# Patient Record
Sex: Male | Born: 1955
Health system: Southern US, Community
[De-identification: ages and names within clinical notes are randomized; demographics above are authoritative.]

## PROBLEM LIST (undated history)

## (undated) DIAGNOSIS — R269 Unspecified abnormalities of gait and mobility: Secondary | ICD-10-CM

## (undated) DIAGNOSIS — R413 Other amnesia: Secondary | ICD-10-CM

## (undated) DIAGNOSIS — F32A Depression, unspecified: Secondary | ICD-10-CM

## (undated) DIAGNOSIS — C8590 Non-Hodgkin lymphoma, unspecified, unspecified site: Secondary | ICD-10-CM

## (undated) DIAGNOSIS — F419 Anxiety disorder, unspecified: Secondary | ICD-10-CM

## (undated) DIAGNOSIS — F329 Major depressive disorder, single episode, unspecified: Secondary | ICD-10-CM

## (undated) DIAGNOSIS — M722 Plantar fascial fibromatosis: Secondary | ICD-10-CM

## (undated) HISTORY — DX: Other amnesia: R41.3

## (undated) HISTORY — DX: Unspecified abnormalities of gait and mobility: R26.9

## (undated) HISTORY — PX: ABDOMINAL SURGERY: SHX537

## (undated) HISTORY — DX: Depression, unspecified: F32.A

## (undated) HISTORY — DX: Plantar fascial fibromatosis: M72.2

## (undated) HISTORY — DX: Major depressive disorder, single episode, unspecified: F32.9

## (undated) HISTORY — DX: Anxiety disorder, unspecified: F41.9

## (undated) HISTORY — PX: BUNIONECTOMY: SHX129

---

## 2000-03-17 ENCOUNTER — Encounter: Payer: Self-pay | Admitting: Rheumatology

## 2000-03-17 ENCOUNTER — Ambulatory Visit (HOSPITAL_COMMUNITY): Admission: RE | Admit: 2000-03-17 | Discharge: 2000-03-17 | Payer: Self-pay | Admitting: *Deleted

## 2002-01-15 ENCOUNTER — Ambulatory Visit (HOSPITAL_COMMUNITY): Admission: RE | Admit: 2002-01-15 | Discharge: 2002-01-15 | Payer: Self-pay | Admitting: *Deleted

## 2005-03-26 ENCOUNTER — Ambulatory Visit: Payer: Self-pay | Admitting: Gastroenterology

## 2005-03-27 ENCOUNTER — Ambulatory Visit: Payer: Self-pay | Admitting: Gastroenterology

## 2005-03-27 ENCOUNTER — Encounter (INDEPENDENT_AMBULATORY_CARE_PROVIDER_SITE_OTHER): Payer: Self-pay | Admitting: *Deleted

## 2005-04-25 ENCOUNTER — Ambulatory Visit: Payer: Self-pay | Admitting: Gastroenterology

## 2005-05-03 ENCOUNTER — Ambulatory Visit (HOSPITAL_COMMUNITY): Admission: RE | Admit: 2005-05-03 | Discharge: 2005-05-03 | Payer: Self-pay | Admitting: Gastroenterology

## 2005-05-23 ENCOUNTER — Ambulatory Visit: Payer: Self-pay | Admitting: Gastroenterology

## 2006-07-10 ENCOUNTER — Ambulatory Visit: Payer: Self-pay | Admitting: Pulmonary Disease

## 2006-07-16 ENCOUNTER — Ambulatory Visit: Payer: Self-pay | Admitting: Licensed Clinical Social Worker

## 2007-03-11 ENCOUNTER — Ambulatory Visit: Payer: Self-pay | Admitting: Pulmonary Disease

## 2007-03-17 ENCOUNTER — Ambulatory Visit: Payer: Self-pay | Admitting: Licensed Clinical Social Worker

## 2007-03-25 ENCOUNTER — Ambulatory Visit: Payer: Self-pay | Admitting: Licensed Clinical Social Worker

## 2007-04-03 ENCOUNTER — Ambulatory Visit: Payer: Self-pay | Admitting: Licensed Clinical Social Worker

## 2007-04-10 ENCOUNTER — Ambulatory Visit: Payer: Self-pay | Admitting: Licensed Clinical Social Worker

## 2007-04-15 ENCOUNTER — Ambulatory Visit: Payer: Self-pay | Admitting: Licensed Clinical Social Worker

## 2007-04-28 ENCOUNTER — Ambulatory Visit: Payer: Self-pay | Admitting: Family Medicine

## 2007-04-28 ENCOUNTER — Ambulatory Visit: Payer: Self-pay | Admitting: Licensed Clinical Social Worker

## 2007-04-28 DIAGNOSIS — G4700 Insomnia, unspecified: Secondary | ICD-10-CM | POA: Insufficient documentation

## 2007-04-28 DIAGNOSIS — F909 Attention-deficit hyperactivity disorder, unspecified type: Secondary | ICD-10-CM | POA: Insufficient documentation

## 2007-04-28 DIAGNOSIS — F419 Anxiety disorder, unspecified: Secondary | ICD-10-CM | POA: Insufficient documentation

## 2007-04-28 DIAGNOSIS — F411 Generalized anxiety disorder: Secondary | ICD-10-CM | POA: Insufficient documentation

## 2007-04-28 DIAGNOSIS — K219 Gastro-esophageal reflux disease without esophagitis: Secondary | ICD-10-CM | POA: Insufficient documentation

## 2007-05-08 ENCOUNTER — Ambulatory Visit: Payer: Self-pay | Admitting: Licensed Clinical Social Worker

## 2007-05-13 ENCOUNTER — Ambulatory Visit: Payer: Self-pay | Admitting: Licensed Clinical Social Worker

## 2007-05-20 ENCOUNTER — Ambulatory Visit: Payer: Self-pay | Admitting: Licensed Clinical Social Worker

## 2007-05-22 ENCOUNTER — Ambulatory Visit: Payer: Self-pay | Admitting: Family Medicine

## 2007-05-29 ENCOUNTER — Ambulatory Visit: Payer: Self-pay | Admitting: Licensed Clinical Social Worker

## 2007-06-03 ENCOUNTER — Ambulatory Visit: Payer: Self-pay | Admitting: Licensed Clinical Social Worker

## 2007-06-10 ENCOUNTER — Ambulatory Visit: Payer: Self-pay | Admitting: Licensed Clinical Social Worker

## 2007-06-10 ENCOUNTER — Encounter: Payer: Self-pay | Admitting: Family Medicine

## 2007-08-31 ENCOUNTER — Other Ambulatory Visit (HOSPITAL_COMMUNITY): Admission: RE | Admit: 2007-08-31 | Discharge: 2007-11-29 | Payer: Self-pay | Admitting: Psychiatry

## 2007-09-02 ENCOUNTER — Ambulatory Visit: Payer: Self-pay | Admitting: Psychiatry

## 2007-11-03 ENCOUNTER — Ambulatory Visit: Payer: Self-pay | Admitting: Family Medicine

## 2007-11-03 LAB — CONVERTED CEMR LAB
Bilirubin Urine: NEGATIVE
Blood in Urine, dipstick: NEGATIVE
Glucose, Urine, Semiquant: NEGATIVE
Ketones, urine, test strip: NEGATIVE
Nitrite: NEGATIVE
Protein, U semiquant: NEGATIVE
Specific Gravity, Urine: 1.02
Urobilinogen, UA: 0.2
WBC Urine, dipstick: NEGATIVE
pH: 7.5

## 2007-11-04 LAB — CONVERTED CEMR LAB
ALT: 32 units/L (ref 0–53)
AST: 25 units/L (ref 0–37)
Albumin: 4.3 g/dL (ref 3.5–5.2)
Alkaline Phosphatase: 59 units/L (ref 39–117)
BUN: 10 mg/dL (ref 6–23)
Basophils Absolute: 0 10*3/uL (ref 0.0–0.1)
Basophils Relative: 0.5 % (ref 0.0–1.0)
Bilirubin, Direct: 0.1 mg/dL (ref 0.0–0.3)
CO2: 30 meq/L (ref 19–32)
Calcium: 9.8 mg/dL (ref 8.4–10.5)
Chloride: 108 meq/L (ref 96–112)
Cholesterol: 197 mg/dL (ref 0–200)
Creatinine, Ser: 0.9 mg/dL (ref 0.4–1.5)
Eosinophils Absolute: 0.2 10*3/uL (ref 0.0–0.6)
Eosinophils Relative: 3.4 % (ref 0.0–5.0)
GFR calc Af Amer: 115 mL/min
GFR calc non Af Amer: 95 mL/min
Glucose, Bld: 101 mg/dL — ABNORMAL HIGH (ref 70–99)
HCT: 45 % (ref 39.0–52.0)
HDL: 37.8 mg/dL — ABNORMAL LOW (ref >39.0)
Hemoglobin: 15.3 g/dL (ref 13.0–17.0)
LDL Cholesterol: 135 mg/dL — ABNORMAL HIGH (ref 0–99)
Lymphocytes Relative: 31.7 % (ref 12.0–46.0)
MCHC: 34.1 g/dL (ref 30.0–36.0)
MCV: 89.9 fL (ref 78.0–100.0)
Monocytes Absolute: 0.4 10*3/uL (ref 0.2–0.7)
Monocytes Relative: 7 % (ref 3.0–11.0)
Neutro Abs: 3.3 10*3/uL (ref 1.4–7.7)
Neutrophils Relative %: 57.4 % (ref 43.0–77.0)
PSA: 0.61 ng/mL (ref 0.10–4.00)
Platelets: 222 10*3/uL (ref 150–400)
Potassium: 4.8 meq/L (ref 3.5–5.1)
RBC: 5 M/uL (ref 4.22–5.81)
RDW: 13.6 % (ref 11.5–14.6)
Sodium: 144 meq/L (ref 135–145)
TSH: 1.07 microintl units/mL (ref 0.35–5.50)
Total Bilirubin: 1.1 mg/dL (ref 0.3–1.2)
Total CHOL/HDL Ratio: 5.2
Total Protein: 6.4 g/dL (ref 6.0–8.3)
Triglycerides: 119 mg/dL (ref 0–149)
VLDL: 24 mg/dL (ref 0–40)
WBC: 5.7 10*3/uL (ref 4.5–10.5)

## 2007-11-17 ENCOUNTER — Ambulatory Visit: Payer: Self-pay | Admitting: Family Medicine

## 2007-11-26 ENCOUNTER — Telehealth: Payer: Self-pay | Admitting: Family Medicine

## 2007-11-27 ENCOUNTER — Ambulatory Visit: Payer: Self-pay | Admitting: *Deleted

## 2007-11-27 ENCOUNTER — Inpatient Hospital Stay (HOSPITAL_COMMUNITY): Admission: AD | Admit: 2007-11-27 | Discharge: 2007-12-03 | Payer: Self-pay | Admitting: *Deleted

## 2008-02-25 ENCOUNTER — Encounter: Payer: Self-pay | Admitting: Family Medicine

## 2008-04-19 ENCOUNTER — Ambulatory Visit: Payer: Self-pay | Admitting: Family Medicine

## 2008-04-19 DIAGNOSIS — E291 Testicular hypofunction: Secondary | ICD-10-CM | POA: Insufficient documentation

## 2008-06-07 ENCOUNTER — Encounter: Payer: Self-pay | Admitting: Family Medicine

## 2010-10-18 NOTE — Letter (Signed)
Summary: Dr. Gerilyn Pilgrim note  Dr. Gerilyn Pilgrim note   Imported By: Kassie Mends 07/06/2007 10:20:37  _____________________________________________________________________  External Attachment:    Type:   Image     Comment:   Dr. Gerilyn Pilgrim note
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
## 2010-10-18 NOTE — Assessment & Plan Note (Signed)
Summary: 3 wk rov/njr   Vital Signs:  Patient Profile:   55 Years Old Male Height:     73.5 inches (186.69 cm) Weight:      179 pounds (81.36 kg) Temp:     98.8 degrees F (37.11 degrees C) oral Pulse rate:   100 / minute Pulse rhythm:   regular BP sitting:   128 / 70  (left arm)  Vitals Entered By: Alfred Levins, CMA (May 22, 2007 8:43 AM)                 Chief Complaint:  f/u on med.  History of Present Illness: Seen recently for anxiety and insomnia, with possible ADHD. Placed on Adderall and Ambien. Seeing Judithe Modest for therapy also. Is sleeping better but does not feel any better during the day. Had seen Dr. Jennelle Human and Dr. Jodi Marble in past, among others.  Current Allergies: No known allergies       Physical Exam  General:     Well-developed,well-nourished,in no acute distress; alert,appropriate and cooperative throughout examination Psych:     moderately anxious.      Impression & Recommendations:  Problem # 1:  INSOMNIA, PERSISTENT (ICD-307.42) Assessment: Improved  Problem # 2:  ANXIETY (ICD-300.00) stop Adderall Orders: Psychiatric Referral (Psych)   Complete Medication List: 1)  Prilosec Otc 20 Mg Tbec (Omeprazole magnesium) .Marland Kitchen.. 1 by mouth once daily 2)  Aspirin 325 Mg Tabs (Aspirin) .Marland Kitchen.. 1 by mouth once daily 3)  Multivitamins Tabs (Multiple vitamin) .Marland Kitchen.. 1 by mouth once daily 4)  Ambien Cr 12.5 Mg Tbcr (Zolpidem tartrate) .... At bedtime   Patient Instructions: 1)  wiil refer to Psychiatry

## 2010-10-18 NOTE — Consult Note (Signed)
Summary: [facility]   Imported By: Maryln Gottron 06/30/2008 13:52:06  _____________________________________________________________________  External Attachment:    Type:   Image     Comment:   External Document
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
## 2010-10-18 NOTE — Assessment & Plan Note (Signed)
Summary: FATIGUE/NJR   Vital Signs:  Patient Profile:   55 Years Old Male Height:     73.5 inches (186.69 cm) Weight:      190 pounds Temp:     98.9 degrees F oral Pulse rate:   119 / minute BP sitting:   114 / 72  (left arm) Cuff size:   large  Vitals Entered By: Alfred Levins, CMA (April 19, 2008 11:31 AM)                 Chief Complaint:  check testosterone level.  History of Present Illness: He is here asking for a referral to an Endocrine doctor to evaluate his "hormones" and specifically for advice on testosterone supplementation. In March he spent one week at the [facility] for anxiety. At that time he was found to have a low testosterone level. He was put on Androgel, and he used this for several months. Then he had complete labs done through [facility] on 02-25-08 including total serum testosterone and free testosterone levels, all of which were normal. For some reason he then stopped the Andorgel 2 months ago, and now he wants to know whether to restart it. He says it never helped him feel better, specifically it did not increase his energy levels. he continues to see Valinda Hoar, and now he is on quite a few psychoactive meds.     Current Allergies: No known allergies   Past Medical History:    Reviewed history from 11/17/2007 and no changes required:       Anxiety, sees Valinda Hoar NP and Dr. Nolen Mu       GERD (has had an esophageal dilatation)       insomnia       ADHD     Review of Systems  The patient denies anorexia, fever, weight loss, weight gain, vision loss, decreased hearing, hoarseness, chest pain, syncope, dyspnea on exertion, peripheral edema, prolonged cough, headaches, hemoptysis, abdominal pain, melena, hematochezia, severe indigestion/heartburn, hematuria, incontinence, genital sores, muscle weakness, suspicious skin lesions, transient blindness, difficulty walking, depression, unusual weight change, abnormal bleeding, enlarged  lymph nodes, angioedema, breast masses, and testicular masses.     Physical Exam  General:     Well-developed,well-nourished,in no acute distress; alert,appropriate and cooperative throughout examination Psych:     Oriented X3, memory intact for recent and remote, normally interactive, good eye contact, and moderately anxious.      Impression & Recommendations:  Problem # 1:  INSOMNIA, PERSISTENT (ICD-307.42)  Problem # 2:  ADHD (ICD-314.01)  Problem # 3:  ANXIETY (ICD-300.00)  The following medications were removed from the medication list:    Zoloft 50 Mg Tabs (Sertraline hcl) ..... Once daily  His updated medication list for this problem includes:    Cymbalta 60 Mg Cpep (Duloxetine hcl) ..... Once daily    Klonopin 0.5 Mg Tabs (Clonazepam) .Marland Kitchen... 1/2 in am, 1/2 at noon, and one at bedtime   Problem # 4:  TESTOSTERONE DEFICIENCY (ICD-257.2)  Orders: Endocrinology Referral (Endocrine)   Complete Medication List: 1)  Prilosec Otc 20 Mg Tbec (Omeprazole magnesium) .Marland Kitchen.. 1 by mouth once daily 2)  Aspirin 325 Mg Tabs (Aspirin) .Marland Kitchen.. 1 by mouth once daily 3)  Multivitamins Tabs (Multiple vitamin) .Marland Kitchen.. 1 by mouth once daily 4)  Ambien Cr 12.5 Mg Tbcr (Zolpidem tartrate) .... One at bedtime 5)  Cymbalta 60 Mg Cpep (Duloxetine hcl) .... Once daily 6)  Abilify 2 Mg Tabs (Aripiprazole) .... Once daily 7)  Vyvanse 70 Mg Caps (Lisdexamfetamine dimesylate) .... Once daily 8)  Klonopin 0.5 Mg Tabs (Clonazepam) .... 1/2 in am, 1/2 at noon, and one at bedtime   Patient Instructions: 1)  Will refer to Endocrine to get their opinion   ]

## 2010-10-18 NOTE — Progress Notes (Signed)
Summary: zoloft rx  Phone Note Call from Patient Call back at Home Phone [phone]   Caller: patient vm triage Call For: George Proctor  Summary of Call: saw dr Ellyn Rubiano last week and was given an rx for adderall.  He does not feel he is tolerating the med.  He is wound up and cant calm down.   he either needs another med or another appt or something for [address] [facility] [address].   Initial call taken by: Roselle Locus,  November 26, 2007 2:28 PM  Follow-up for Phone Call        stop Adderall, and start Zoloft 50mg  once daily . Call in #30 and see me in 3 weeks. Follow-up by: Nelwyn Salisbury MD,  November 27, 2007 9:03 AM  Additional Follow-up for Phone Call Additional follow up Details #1::        rx called in. left pt mess to sch appt in 3 weeks Additional Follow-up by: Sindy Guadeloupe RN,  November 27, 2007 5:07 PM    New/Updated Medications: ZOLOFT 50 MG  TABS (SERTRALINE HCL) once daily   Prescriptions: ZOLOFT 50 MG  TABS (SERTRALINE HCL) once daily  #30 x 0   Entered by:   Sindy Guadeloupe RN   Authorized by:   Nelwyn Salisbury MD   Signed by:   Sindy Guadeloupe RN on 11/27/2007   Method used:   Telephoned to ...         RxID:   [id number]

## 2011-01-29 NOTE — Assessment & Plan Note (Signed)
[facility]                             PULMONARY OFFICE NOTE   NAME:George Proctor, George Proctor                       MRN:          [id number]  DATE:03/11/2007                            DOB:          10/26/1955    I met George Proctor today in follow up for his fatigue and daytime  sleepiness.   He says that since his last visit with me in October 2007, he had been  evaluated by Judithe Modest once, but then he had switched his therapist to  the therapist that his wife was going to. He says that he seemed to  respond better to Judithe Modest, and is planning on being re-evaluated by  her. Otherwise, his difficulties with sleeping at night and feeling and  tired during the day are essentially the same. Again, I had seen copies  of his overnight polysomnogram and MSLT from January 2004 at [facility], and the findings which were remarkable from these tests where  that he had an average sleep latency of 3.5 minutes which would be  consistent with excessive daytime sleepiness, but there is really no  other pathology noted. What I have recommended for George Proctor is to follow  up with Judithe Modest to optimize his therapy for his depression. If, after  that, he is still having difficulties with fatigue and excessive daytime  sleepiness, then we can certainly consider restarting a stimulant  medication, but I would not want to do that at this time.   I will follow up with him in approximately six months.     Coralyn Helling, MD  Electronically Signed    VS/MedQ  DD: 03/11/2007  DT: 03/12/2007  Job #: [id number]   cc:   Judithe Modest, MSW, LCSW

## 2011-02-01 NOTE — Procedures (Signed)
[facility]  Patient:    MARYLAND, LUPPINO Visit Number: [id number] MRN: [id number]          Service Type: END Location: ENDO Attending Physician:  Sabino Gasser Dictated by:   Sabino Gasser, M.D. Proc. Date: 01/15/02 Admit Date:  01/15/2002                             Procedure Report  PROCEDURE:  Upper endoscopy.  INDICATION:  Gastroesophageal reflux disease.  ANESTHESIA:  Demerol 50, Versed 6 mg.  DESCRIPTION OF PROCEDURE:  With the patient mildly sedated in the left lateral decubitus position, the Olympus videoscopic endoscope was inserted into the mouth and passed under direct vision through the esophagus which appeared normal into the stomach; fundus, body, antrum, duodenal bulb, and second portion of duodenum were visualized.  From this point, the endoscope was slowly withdrawn, taking circumferential views of the entire duodenal mucosa until the endoscope then pulled back into the stomach and placed in retroflexion to view the stomach from below.  The endoscope was then straightened and withdrawn, taking circumferential views of the remaining gastric and esophageal mucosa.  The patients vital signs and pulse oximeter remained stable.  The patient tolerated the procedure well without apparent complications.  FINDINGS:  Unremarkable examination.  PLAN:  Have patient will call me for results and follow up with me as an outpatient. Dictated by:   Sabino Gasser, M.D. Attending Physician:  Sabino Gasser DD:  01/15/02 TD:  01/16/02 Job: [id number]

## 2011-02-01 NOTE — Assessment & Plan Note (Signed)
[facility]                             PULMONARY OFFICE NOTE   NAME:Liuzzi, ARK AGRUSA                       MRN:          [id number]  DATE:09/01/2006                            DOB:          08-07-1956    I received a copy of Mr. Housand overnight polysomnogram and MSLT from  [facility].   The overnight polysomnogram was done on October 05, 2002 and this was an  essentially polysomnogram, although he did appear to have a reduced  sleep latency to 3.5 minutes.   He then had a multiple sleep latency test on October 06, 2002 and this  was a test with 4 nap sessions.  He achieved sleep in all 4 studies with  a mean sleep latency of 3.5 minutes, which is significantly reduced;  however, he did not have any sleep onset REMs periods.  These results  would be consistant with excessive sleepiness, but certainly not  suggestive of narcolepsy.     Coralyn Helling, MD  Electronically Signed    VS/MedQ  DD: 09/01/2006  DT: 09/02/2006  Job #: [id number]

## 2011-02-01 NOTE — Assessment & Plan Note (Signed)
[facility]                          BEHAVIORAL MEDICINE OFFICE NOTE   George Proctor, George Proctor                       MRN:          [id number]  DATE:07/23/2006                            DOB:          Feb 15, 1956    I spoke with the patient on July 22, 2006, on the phone.  He had called  me the day before to follow up on a referral I had given him.  During our  first session which was on July 16, 2006, the patient had described  suffering from feelings of depression and described difficulties which  sounded like they might be neurological in origin.  He mentioned that he has  sudden difficulty with processing information, and coordinating himself  physically.  An example of this is that sometimes he will think about how he  has to get up and get a book from his bookcase and suddenly cannot  coordinate himself enough to do this.  These kinds of things happen  sporadically every couple of weeks.  He describes having a very poor memory,  and he cannot process information very well or very fast.  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  I suggested to [name] that it might be a good idea for him  to follow up with his primary care physician about getting a referral to  neurologist to be checked up.  He had shared these symptoms with his long-  time primary care physician, Dr. Mia Proctor, over a period of five  years.  She had apparently prescribed nutritional and herbal remedies for  this repeatedly but things had not improved.  I suggested that it sounded to  me like he was unhappy with his primary care physician and suggested that he  might like to see someone at [facility].  He agreed that this might be helpful  so I gave him a referral to Dr. Artist Proctor.  He called me a day or so later to tell  me he was unable to get an appointment with Dr. Artist Proctor until December and did  not wish to wait  this long.  I suggested that he see whether he could get on  a cancellation list in order to possibly get an earlier appointment and he  called back again the next day to inform me that this was not a possibility.  The patient decided that since he did not want to wait a long time that he  would go back to his primary care doctor, Dr. Ananias Proctor, just to get a  referral to a neurologist.  The patient called to inform me that he had done  this and Dr. Ananias Proctor had referred him to Dr. Sandria Proctor.  No appointment has been  made as yet because Dr. Sandria Proctor will require a full review of Mr. Santerre  medical records before he decides whether to treat him.  I asked Mr. Hyser whether he felt he needed to come in before he sees Dr.  Sandria Proctor, or anytime soon.  He said no, not at  the moment but that he would call  if he needed to come in.  He also said that he would keep me informed as to  how things went with Dr. Sandria Proctor.  We will await the patient's call for further  contact.    ______________________________  George Proctor, MSW, LCSW    SB/MedQ  DD: 07/23/2006  DT: 07/23/2006  Job #: [id number]

## 2011-02-01 NOTE — Discharge Summary (Signed)
NAMEMARVEL, [name]                ACCOUNT NO.:  [id number]   MEDICAL RECORD NO.:  [id number]          PATIENT TYPE:  IPS   LOCATION:  0306                          FACILITY:  [facility]   PHYSICIAN:  Geoffery Lyons, M.D.      DATE OF BIRTH:  03-Apr-1956   DATE OF ADMISSION:  11/27/2007  DATE OF DISCHARGE:  12/03/2007                               DISCHARGE SUMMARY   CHIEF COMPLAINT AND PRESENTING ILLNESS:  This was the first admission to  [facility] for this 55 year old white male voluntarily  admitted working in a Engineer, building services.  Presented with  suicidal thoughts of killing himself with carbon monoxide.  Has been  increasingly more depressed since the wife left in October 2008.  She  left because he was not functioning, getting worse for the last 3 weeks.  More pressure at work with major project deadline.  Endorsed chronic  depression with issues of relationships, multiple medication trials,  anhedonic, anxious, with panic, but also tired, angry.   PAST PSYCHIATRIC HISTORY:  Seen by Valinda Hoar and Merlyn Albert May.  First  time at [facility].  Has a history of prior overdose when he was  in college.  Has been in IOP in the past.   ALCOHOL AND DRUG HISTORY:  Denies active use of any substances.   MEDICAL HISTORY:  Noncontributory.  Recently Desyrel which made him more  anxious when dose was doubled.  Ambien CR 12.5 mg per day.  Physical  exam failed to show any acute findings.   LABORATORY WORKUP:  Results not available in the chart.   PHYSICAL EXAMINATION:  Reveals a fully-alert cooperative male.  Mood  anxious, depressed.  Affect anxious, depressed.  Thought processes  logical, coherent and relevant.  Endorsed suicidal ruminations.  No  intent at this particular time.  No active homicidal ideas, no  delusions.  No hallucinations.  Cognition well-preserved.   AXIS I:  Major depression, recurrent.  AXIS II: No diagnosis.  AXIS III:  No diagnosis.  AXIS IV: Moderate.  AXIS V:  Upon admission 35, highest global assessment of functioning in  the last year 70.   COURSE IN THE HOSPITAL:  He was admitted.  He was started in individual  and group psychotherapy.  Endorsed that he had been overwhelmed by  everything.  Endorsed he had a nervous breakdown, cannot stop crying.  Cannot sleep, cannot rest, anhedonia, nothing to look forward to.  Wife  moved out October 2008.  She could not deal with him.  __________  he  tends to procrastinate.  Head is foggy.  Long history of depression for  years.  Suicidal ideations in 1981, attempt when he was 23.  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  Feeling alone and empty.  Had a hard time functioning with  his wife of 2 years.  He was also having issues dealing with his stepson  who was 13.  Had hard time making connections.  Married  in 1982 for 3  years, lived with someone for 61.  For past psychiatric history, had  seen Fred May two or three times.  Irritability.  Has been on Prozac,  Zoloft, Celexa, Paxil, Wellbutrin, Cymbalta and Abilify, Adderall on and  off.  Also returning on Klonopin.  March 15 he had remembered that he  might have done better on the Cymbalta and the Abilify combination.  We  tried Klonopin to help with the anxiety that he felt it was  incapacitating.  Felt the Klonopin was too sedating.  Very worried about  the project that he had to deal with.  Ruminating about it, worrying  about it, not sure if it could be delayed until he is able to go back.  There was anxiety, worrying, depressed mood.  We worked on Cymbalta 30  and Abilify 2, worked with Klonopin 0.5 twice a day and Klonopin 1 mg at  bedtime.  We checked the thyroid.  Thyroid level was very low.  We  consulted with his primary physician and he was in accord to replace  testosterone.  He has had previous  levels that were borderline low, not  as low as the one obtained upon this admission.  We continued the  Cymbalta and the Abilify.  We adjusted the Klonopin.  He was somewhat  sedated in the morning.  We continued to decrease the Klonopin as he  required less of a dose as the anxiety seemed to be under better  control.  Did say that Desyrel helped him with the smaller dosages, was  wanting to give this another try.  He endorsed that cognitive dulling  had been going on for years since he was a child, possibly inattentive.  We tried Adderall 10 mg in the morning and at 1 p.m.  Family session  with his father and wife.  They are separated.  There were married  shortly after wife came to the [place].  The patient endorsed that he was  feeling better.  The family validated that he was not crying.  He was  also relieved that a project at work has been given to someone else.  This was a major stressor.  Did admit to letting the stress build and  build and not expressing himself to anyone.  Fears rejection or that he  will make others depressed if he shares.  He was more hopeful about  medications and was considering ECT in the future if this combination of  medication was not going to be effective.  Wife endorsed that he had had  ups and downs, mostly no energy, no motivation.  March 19 he was in full  contact with reality.  There were no active suicidal or homicidal ideas,  no hallucination or delusions.  Felt that Desyrel was helpful.  He was  more hopeful, was willing to go back to individual therapy and pursue  further medication management.  Testosterone is being replaced and that  might also make a difference.   DISCHARGE DIAGNOSES:  AXIS I:  Major depression recurrent; attention  deficit hyperactivity disorder, inattentive.  AXIS II:  Dependent personality traits.  AXIS III:  Low testosterone level.  AXIS IV:  Moderate.  AXIS V:  Upon discharge 50-55.   Discharged on:  1. AndroGel 1% gel  5 grams daily, apply as directed.  2. Ambien CR 12.5 at bedtime as needed for sleep.  3. Cymbalta 30 mg in the morning.  4. Abilify 2 mg in the  morning.  5. Klonopin 0.5 one-half twice a day and one at night.  6. Adderall 10 mg one in the morning and one in the afternoon with      plans to increase to one-and-a-half in the morning and one-and-a-      half in the afternoon.   FOLLOW-UP:  Fred May and Valinda Hoar.      Geoffery Lyons, M.D.  Electronically Signed     IL/MEDQ  D:  01/01/2008  T:  01/01/2008  Job:  [id number]
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
## 2011-03-07 ENCOUNTER — Other Ambulatory Visit: Payer: Self-pay | Admitting: Gastroenterology

## 2011-03-07 DIAGNOSIS — R5383 Other fatigue: Secondary | ICD-10-CM

## 2011-03-07 DIAGNOSIS — R197 Diarrhea, unspecified: Secondary | ICD-10-CM

## 2011-03-08 ENCOUNTER — Ambulatory Visit
Admission: RE | Admit: 2011-03-08 | Discharge: 2011-03-08 | Disposition: A | Discharge status: Home / Self Care | Payer: 59 | Source: Ambulatory Visit | Attending: Gastroenterology | Admitting: Gastroenterology

## 2011-03-08 DIAGNOSIS — R197 Diarrhea, unspecified: Secondary | ICD-10-CM

## 2011-03-08 DIAGNOSIS — R5383 Other fatigue: Secondary | ICD-10-CM

## 2011-06-10 LAB — COMPREHENSIVE METABOLIC PANEL
ALT: 23
AST: 21
Albumin: 4.3
Alkaline Phosphatase: 57
BUN: 12
CO2: 29
Calcium: 9.9
Chloride: 105
Creatinine, Ser: 1.04
GFR calc Af Amer: >60
GFR calc non Af Amer: >60
Glucose, Bld: 105 — ABNORMAL HIGH
Potassium: 4.1
Sodium: 141
Total Bilirubin: 1
Total Protein: 6.7

## 2011-06-10 LAB — URINALYSIS, ROUTINE W REFLEX MICROSCOPIC
Bilirubin Urine: NEGATIVE
Glucose, UA: NEGATIVE
Hgb urine dipstick: NEGATIVE
Nitrite: NEGATIVE
Protein, ur: NEGATIVE
Specific Gravity, Urine: 1.009
Urobilinogen, UA: 0.2
pH: 6

## 2011-06-10 LAB — CBC
HCT: 43.9
Hemoglobin: 15.3
MCHC: 34.8
MCV: 86.2
Platelets: 271
RBC: 5.1
RDW: 14.3
WBC: 7.7

## 2011-06-10 LAB — DRUGS OF ABUSE SCREEN W/O ALC, ROUTINE URINE
Amphetamine Screen, Ur: NEGATIVE
Barbiturate Quant, Ur: NEGATIVE
Benzodiazepines.: NEGATIVE
Cocaine Metabolites: NEGATIVE
Creatinine,U: 66.1
Marijuana Metabolite: NEGATIVE
Methadone: NEGATIVE
Opiate Screen, Urine: NEGATIVE
Phencyclidine (PCP): NEGATIVE
Propoxyphene: NEGATIVE

## 2011-06-10 LAB — MAGNESIUM: Magnesium: 2.3

## 2011-06-10 LAB — TESTOSTERONE, FREE: Testosterone, Free: 24 pg/mL — ABNORMAL LOW (ref 47.0–244.0)

## 2011-06-10 LAB — SEX HORMONE BINDING GLOBULIN: Sex Hormone Binding: 33 nmol/L (ref 13–71)

## 2011-06-10 LAB — PROLACTIN: Prolactin: 10.1 (ref 2.1–17.1)

## 2011-06-10 LAB — TSH: TSH: 1.321

## 2011-06-10 LAB — TESTOSTERONE: Testosterone: 129.54 — ABNORMAL LOW (ref 350–890)

## 2011-06-10 LAB — TESTOSTERONE, % FREE: Testosterone-% Free: 1.9 — ABNORMAL HIGH (ref 1.6–2.9)

## 2011-06-17 ENCOUNTER — Encounter: Payer: Self-pay | Admitting: Neurology

## 2011-10-21 ENCOUNTER — Telehealth: Payer: Self-pay | Admitting: Cardiovascular Disease

## 2011-10-21 NOTE — Telephone Encounter (Signed)
C/o chest pain off and on for several months, weakness, feeling faint, tired, dizziness.  Appt made to see Dr. Antoine Poche on 10/22/2011.

## 2011-10-21 NOTE — Telephone Encounter (Signed)
New Msg: pt calling wanting to speak with nurse about getting appt to see Dr. Excell Seltzer sooner. Pt c/o chest pain. Pt is having chest pain now. Pt also c/o a little sob. Pt c/o dizziness, and hot flashes.   Pt call transferred to triage.

## 2011-10-22 ENCOUNTER — Other Ambulatory Visit: Payer: Self-pay | Admitting: *Deleted

## 2011-10-22 ENCOUNTER — Other Ambulatory Visit: Payer: Self-pay | Admitting: Internal Medicine

## 2011-10-22 ENCOUNTER — Encounter: Payer: Self-pay | Admitting: Cardiology

## 2011-10-22 ENCOUNTER — Encounter: Payer: Self-pay | Admitting: *Deleted

## 2011-10-22 ENCOUNTER — Ambulatory Visit (INDEPENDENT_AMBULATORY_CARE_PROVIDER_SITE_OTHER): Payer: 59 | Admitting: Cardiology

## 2011-10-22 VITALS — BP 110/80 | HR 76 | Ht 73.0 in | Wt 176.0 lb

## 2011-10-22 DIAGNOSIS — R9439 Abnormal result of other cardiovascular function study: Secondary | ICD-10-CM

## 2011-10-22 DIAGNOSIS — R079 Chest pain, unspecified: Secondary | ICD-10-CM | POA: Insufficient documentation

## 2011-10-22 MED ORDER — METOPROLOL TARTRATE 25 MG PO TABS: 25.0000 mg | ORAL_TABLET | Freq: Two times a day (BID) | ORAL | Status: DC

## 2011-10-22 NOTE — Patient Instructions (Signed)
The current medical regimen is effective;  continue present plan and medications.  Your physician has requested that you have cardiac CT. Cardiac computed tomography (CT) is a painless test that uses an x-ray machine to take clear, detailed pictures of your heart. For further information please visit [email or web address] Please follow instruction sheet as given.   Follow up will be based on results of this testing

## 2011-10-22 NOTE — Progress Notes (Signed)
HPI The patient presents as a new patient for evaluation of chest discomfort. He has had no prior cardiac history. He reports that he's been getting chest discomfort since November. He says that this happens daily. It is off and on. He thought it might have related to Vyvans which he was taking.  He stopped this but the pain has continued. He describes a dull left-sided chest discomfort. It comes on at rest it goes away spontaneously. It radiates somewhat through to his back. It might be 3/10 in intensity. It can be associated with nausea. There is no jaw discomfort. He has not had shortness of breath, PND or orthopnea. He has not had any palpitations, presyncope or syncope. He is not overly active but hasn't noticed this with activities such as pulling the garbage cans to the curb.  Of note he did have a cardiopulmonary stress test at his primary provider's office. This was felt to be somewhat low risk but it was abnormal. He had no chest pain. However, he did have a described abrupt decrease in his cardiac output consistent with exercise induced myocardial dysfunction. This was felt to have an ischemic etiology possibly.  No Known Allergies  Current Outpatient Prescriptions  Medication Sig Dispense Refill  . aspirin 325 MG tablet Take 325 mg by mouth daily.      . fish oil-omega-3 fatty acids 1000 MG capsule Take 2 g by mouth daily.      . Multiple Vitamin (MULTIVITAMIN) capsule Take 1 capsule by mouth daily.      . Omeprazole (PRILOSEC PO) Take by mouth daily.        Past Medical History  Diagnosis Date  . Depression     Past Surgical History  Procedure Date  . Abdominal surgery     Childhood  . Bunionectomy     Family History  Problem Relation Age of Onset  . Aneurysm Mother 77    Thoracic    History   Social History  . Marital Status: Married    Spouse Name: N/A    Number of Children: 0  . Years of Education: N/A   Occupational History  . [employer] social research officer    Social History Main Topics  . Smoking status: Former Smoker    Types: Cigarettes  . Smokeless tobacco: Former Neurosurgeon    Quit date: 10/22/1971   Comment: Sporadic  . Alcohol Use: Not on file  . Drug Use: Not on file  . Sexually Active: Not on file   Other Topics Concern  . Not on file   Social History Narrative  . No narrative on file    ROS:  As stated in the HPI and negative for all other systems.  PHYSICAL EXAM BP 110/80  Pulse 76  Ht 6\' 1"  (1.854 m)  Wt 79.833 kg (176 lb)  BMI 23.22 kg/m2 GENERAL:  Well appearing HEENT:  Pupils equal round and reactive, fundi not visualized, oral mucosa unremarkable NECK:  No jugular venous distention, waveform within normal limits, carotid upstroke brisk and symmetric, no bruits, no thyromegaly LYMPHATICS:  No cervical, inguinal adenopathy LUNGS:  Clear to auscultation bilaterally BACK:  No CVA tenderness CHEST:  Unremarkable HEART:  PMI not displaced or sustained,S1 and S2 within normal limits, no S3, no S4, no clicks, no rubs, no murmurs ABD:  Flat, positive bowel sounds normal in frequency in pitch, no bruits, no rebound, no guarding, no midline pulsatile mass, no hepatomegaly, no splenomegaly EXT:  2 plus pulses  throughout, no edema, no cyanosis no clubbing SKIN:  No rashes no nodules NEURO:  Cranial nerves II through XII grossly intact, motor grossly intact throughout PSYCH:  Cognitively intact, oriented to person place and time  EKG:  Sinus rhythm, rate 76, axis within normal limits, intervals within normal limits, no acute ST-T wave changes.  ASSESSMENT AND PLAN

## 2011-10-22 NOTE — Assessment & Plan Note (Signed)
His chest pain is concerning for possible ischemic etiology. He has an abnormal noninvasive stress test as described. Next appropriate test would be cardiac CT angiography. This would allow me to further quantify any possible obstructive coronary disease. I would also like to image his aorta with this study as he has a family history of aortic dissection in a first-degree relative. If this workup is negative I think referral for GI evaluation would be warranted.

## 2011-10-22 NOTE — Assessment & Plan Note (Addendum)
This will be evaluated as above.  I will give him 25 mg bid of metoprolol prior to the test.

## 2011-10-22 NOTE — Progress Notes (Signed)
Addended by: Sharin Grave on: 10/22/2011 02:30 PM   Modules accepted: Orders

## 2011-10-23 ENCOUNTER — Ambulatory Visit (HOSPITAL_COMMUNITY)
Admission: RE | Admit: 2011-10-23 | Discharge: 2011-10-23 | Disposition: A | Discharge status: Home / Self Care | Payer: 59 | Source: Ambulatory Visit | Attending: Cardiology | Admitting: Cardiology

## 2011-10-23 VITALS — BP 104/59 | HR 68 | Resp 16

## 2011-10-23 DIAGNOSIS — R079 Chest pain, unspecified: Secondary | ICD-10-CM

## 2011-10-23 DIAGNOSIS — R9439 Abnormal result of other cardiovascular function study: Secondary | ICD-10-CM

## 2011-10-23 MED ORDER — IOHEXOL 350 MG/ML SOLN
80.0000 mL | Freq: Once | INTRAVENOUS | Status: AC | PRN
  Administered 2011-10-23: 80 mL via INTRAVENOUS

## 2011-10-23 MED ORDER — NITROGLYCERIN 0.4 MG SL SUBL
SUBLINGUAL_TABLET | SUBLINGUAL | Status: AC
  Filled 2011-10-23: qty 25

## 2011-10-23 MED ORDER — METOPROLOL TARTRATE 1 MG/ML IV SOLN
INTRAVENOUS | Status: AC
  Filled 2011-10-23: qty 10

## 2011-10-23 NOTE — Progress Notes (Signed)
Discharged walking. VS stable. Tolerated fluids, crackers and peanut butter.

## 2011-10-23 NOTE — Progress Notes (Signed)
Here for CT heart. Has been having chest pain that varies in location and sometimes goes through to back since Nov. Currently having chest pain 1/10 right central chest that feels dull pressure. Not on erectile dysfunction meds. Takes Omeprazole at home and took a Metoprolol po yesterday evening and this morning. Is not diabetic and no history of hypertension or kidney disease.Has never taken a nitro. Teaching done re test and patient verbalized understanding. Consent form signed. In sinus rhythm rate 72 to 64. Dr. Eden Emms paged.

## 2011-10-30 ENCOUNTER — Encounter: Payer: Self-pay | Admitting: Cardiology

## 2011-10-30 ENCOUNTER — Ambulatory Visit (INDEPENDENT_AMBULATORY_CARE_PROVIDER_SITE_OTHER): Payer: 59 | Admitting: Cardiology

## 2011-10-30 VITALS — BP 110/70 | HR 64 | Ht 73.0 in | Wt 175.0 lb

## 2011-10-30 DIAGNOSIS — I77819 Aortic ectasia, unspecified site: Secondary | ICD-10-CM

## 2011-10-30 DIAGNOSIS — R0600 Dyspnea, unspecified: Secondary | ICD-10-CM

## 2011-10-30 DIAGNOSIS — R0602 Shortness of breath: Secondary | ICD-10-CM

## 2011-10-30 DIAGNOSIS — I77810 Thoracic aortic ectasia: Secondary | ICD-10-CM | POA: Insufficient documentation

## 2011-10-30 LAB — BRAIN NATRIURETIC PEPTIDE: Pro B Natriuretic peptide (BNP): 7 pg/mL (ref 0.0–100.0)

## 2011-10-30 NOTE — Assessment & Plan Note (Signed)
I do plan to follow this with an MRA in 2 years. We discussed the anatomy and physiology.

## 2011-10-30 NOTE — Progress Notes (Signed)
   HPI The patient presents as a new patient for evaluation of dyspnea.  Recent symptoms are outlined in the previous note. I sent him for coronary CT angiography which demonstrated normal coronaries. He did have a mildly dilated aortic root. The suggestion is that this be followed up in 2 years with an MRA. He came back today to discuss continued symptoms of dyspnea with exertion. He's not describing PND or orthopnea. There has been no weight gain or swelling. His father was recently treated with diuretics for symptoms of dyspnea and has had much improvement. He wonders if this might help.  No Known Allergies  Current Outpatient Prescriptions  Medication Sig Dispense Refill  . aspirin 325 MG tablet Take 325 mg by mouth daily.      . fish oil-omega-3 fatty acids 1000 MG capsule Take 2 g by mouth daily.      . metoprolol tartrate (LOPRESSOR) 25 MG tablet Take 1 tablet (25 mg total) by mouth 2 (two) times daily.  60 tablet  5  . Multiple Vitamin (MULTIVITAMIN) capsule Take 1 capsule by mouth daily.      . Omeprazole (PRILOSEC PO) Take by mouth daily.        Past Medical History  Diagnosis Date  . Depression     Past Surgical History  Procedure Date  . Abdominal surgery     Childhood  . Bunionectomy     ROS:  As stated in the HPI and negative for all other systems.  PHYSICAL EXAM There were no vitals taken for this visit. GENERAL:  Well appearing, anxious NECK:  No jugular venous distention, waveform within normal limits, carotid upstroke brisk and symmetric, no bruits, no thyromegaly LYMPHATICS:  No cervical, inguinal adenopathy LUNGS:  Clear to auscultation bilaterally BACK:  No CVA tenderness CHEST:  Unremarkable HEART:  PMI not displaced or sustained,S1 and S2 within normal limits, no S3, no S4, no clicks, no rubs, no murmurs ABD:  Flat, positive bowel sounds normal in frequency in pitch, no bruits, no rebound, no guarding, no midline pulsatile mass, no hepatomegaly, no  splenomegaly EXT:  2 plus pulses throughout, no edema, no cyanosis no clubbing NEURO:  Cranial nerves II through XII grossly intact, motor grossly intact throughout PSYCH:  Cognitively intact, oriented to person place and time  ASSESSMENT AND PLAN

## 2011-10-30 NOTE — Assessment & Plan Note (Signed)
I doubt a cardiac etiology or an indication for a diuretic. I will check a BNP. This this is normal no change in therapy or further testing will be indicated.

## 2011-10-30 NOTE — Patient Instructions (Signed)
Please have blood work today  The current medical regimen is effective;  continue present plan and medications.  Follow up with an MRA in 2 years.

## 2011-11-12 ENCOUNTER — Ambulatory Visit: Payer: 59 | Admitting: Cardiovascular Disease

## 2012-01-16 DIAGNOSIS — F339 Major depressive disorder, recurrent, unspecified: Secondary | ICD-10-CM | POA: Insufficient documentation

## 2012-10-15 ENCOUNTER — Encounter: Payer: Self-pay | Admitting: Gastroenterology

## 2013-05-19 ENCOUNTER — Ambulatory Visit: Payer: 59 | Admitting: Cardiology

## 2013-05-21 ENCOUNTER — Encounter: Payer: Self-pay | Admitting: Cardiology

## 2013-05-21 ENCOUNTER — Ambulatory Visit (INDEPENDENT_AMBULATORY_CARE_PROVIDER_SITE_OTHER): Payer: 59 | Admitting: Cardiology

## 2013-05-21 VITALS — BP 127/86 | HR 107 | Ht 73.0 in | Wt 188.8 lb

## 2013-05-21 DIAGNOSIS — I77810 Thoracic aortic ectasia: Secondary | ICD-10-CM

## 2013-05-21 DIAGNOSIS — R079 Chest pain, unspecified: Secondary | ICD-10-CM

## 2013-05-21 DIAGNOSIS — R0600 Dyspnea, unspecified: Secondary | ICD-10-CM

## 2013-05-21 DIAGNOSIS — R42 Dizziness and giddiness: Secondary | ICD-10-CM

## 2013-05-21 DIAGNOSIS — R0602 Shortness of breath: Secondary | ICD-10-CM

## 2013-05-21 NOTE — Progress Notes (Signed)
   HPI The patient presents for follow up of  dyspnea aortic root dilatation.  He has had some chest pain in the past.   CT angiography demonstrated normal coronaries. He did have a mildly dilated aortic root. The suggestion is that this be followed up in 2 years with an MRA. He comes back today for followup. He continues to have some dyspnea as described but this does not seem to be worse. He's not describing PND or orthopnea. He's had some episodes of lightheadedness with position changes and distant syncope but this is not recurrent or frequent. His biggest issue seems to be a lack of energy. He's also had some what he thinks is mild confusion or vague neurologic symptoms. He says he seen a neurologist without clear etiology identified.  No Known Allergies  Current Outpatient Prescriptions  Medication Sig Dispense Refill  . fish oil-omega-3 fatty acids 1000 MG capsule Take 6 g by mouth daily.       . Multiple Vitamin (MULTIVITAMIN) capsule Take 1 capsule by mouth daily.      . Omeprazole (PRILOSEC PO) Take 10 mg by mouth as needed.       . TESTIM 50 MG/5GM GEL Apply 5 g topically daily.       Marland Kitchen aspirin 325 MG tablet Take 325 mg by mouth daily.       No current facility-administered medications for this visit.    Past Medical History  Diagnosis Date  . Depression     Past Surgical History  Procedure Laterality Date  . Abdominal surgery      Childhood  . Bunionectomy      ROS:  As stated in the HPI and negative for all other systems.  PHYSICAL EXAM BP 130/83  Pulse 106  Ht 6\' 1"  (1.854 m)  Wt 188 lb 12.8 oz (85.639 kg)  BMI 24.91 kg/m2 GENERAL:  Well appearing, anxious NECK:  No jugular venous distention, waveform within normal limits, carotid upstroke brisk and symmetric, no bruits, no thyromegaly LYMPHATICS:  No cervical, inguinal adenopathy LUNGS:  Clear to auscultation bilaterally BACK:  No CVA tenderness CHEST:  Unremarkable HEART:  PMI not displaced or sustained,S1  and S2 within normal limits, no S3, no S4, no clicks, no rubs, no murmurs ABD:  Flat, positive bowel sounds normal in frequency in pitch, no bruits, no rebound, no guarding, no midline pulsatile mass, no hepatomegaly, no splenomegaly EXT:  2 plus pulses throughout, no edema, no cyanosis no clubbing NEURO:  Cranial nerves II through XII grossly intact, motor grossly intact throughout PSYCH:  Cognitively intact, oriented to person place and time  EKG:  Sinus rhythm, rate 90, and is within limits, intervals within normal limits, no acute ST-T wave changes.   ASSESSMENT AND PLAN  AORTIC ROOT DILATATION:  I will check an MRI in February to look at his aortic root size. We will then follow this accordingly.  DYSPNEA:  This is unchanged from previous.  His proBNP was 7 when he complained of this previously. I do not strongly suspect a cardiac etiology. No further cardiac workup is suggested.  DIZZINESS: he describes some mild orthostatic symptoms. He was not orthostatic in the office. We discussed conservative management.

## 2013-05-21 NOTE — Patient Instructions (Addendum)
The current medical regimen is effective;  continue present plan and medications.  You will be scheduled for MRI/MRA in 2/15 to look at your aortic root.  Follow up in 18 months with Dr Antoine Poche.  You will receive a letter in the mail 2 months before you are due.  Please call us when you receive this letter to schedule your follow up appointment.

## 2013-06-29 ENCOUNTER — Telehealth: Payer: Self-pay | Admitting: Cardiology

## 2013-06-29 DIAGNOSIS — R531 Weakness: Secondary | ICD-10-CM

## 2013-06-29 DIAGNOSIS — R5383 Other fatigue: Secondary | ICD-10-CM

## 2013-06-29 DIAGNOSIS — Z8269 Family history of other diseases of the musculoskeletal system and connective tissue: Secondary | ICD-10-CM

## 2013-06-29 DIAGNOSIS — M2550 Pain in unspecified joint: Secondary | ICD-10-CM

## 2013-06-29 NOTE — Telephone Encounter (Signed)
Pt is aware order has been placed and he should expect a call with an appointment

## 2013-06-29 NOTE — Telephone Encounter (Signed)
New problem    Referral to Hemologist

## 2013-06-29 NOTE — Telephone Encounter (Signed)
Per pt - he reports having trouble for some time with fatigue, weakness, dizziness, joint pain and trouble concentrating.  He has a family HX of polymyalgia rheumatica and per his aunt a hematologist is who diagnosed her.  He has seen his PCP who pt reports is "no help" and has asked the PCP for a referral however it was denied.  Pt would like Dr Antoine Poche to refer him to some one.  Aware this is an usual request but I will discuss with MD and call him back with further instructions/orders.  He is in agreement.

## 2013-06-29 NOTE — Telephone Encounter (Signed)
If he would like to see a rheumatologist I suggest Dr. Stacey Drain.

## 2013-10-18 ENCOUNTER — Ambulatory Visit (HOSPITAL_COMMUNITY)
Admission: RE | Admit: 2013-10-18 | Discharge: 2013-10-18 | Disposition: A | Discharge status: Home / Self Care | Payer: 59 | Source: Ambulatory Visit | Attending: Cardiology | Admitting: Cardiology

## 2013-10-18 DIAGNOSIS — I77810 Thoracic aortic ectasia: Secondary | ICD-10-CM

## 2013-10-18 DIAGNOSIS — I77819 Aortic ectasia, unspecified site: Secondary | ICD-10-CM | POA: Insufficient documentation

## 2013-10-18 MED ORDER — GADOBENATE DIMEGLUMINE 529 MG/ML IV SOLN
20.0000 mL | Freq: Once | INTRAVENOUS | Status: AC
  Administered 2013-10-18: 20 mL via INTRAVENOUS

## 2013-12-14 ENCOUNTER — Ambulatory Visit (INDEPENDENT_AMBULATORY_CARE_PROVIDER_SITE_OTHER): Payer: 59 | Admitting: Podiatry

## 2013-12-14 ENCOUNTER — Encounter: Payer: Self-pay | Admitting: Podiatry

## 2013-12-14 VITALS — BP 119/78 | HR 77 | Ht 73.0 in | Wt 190.0 lb

## 2013-12-14 DIAGNOSIS — M79606 Pain in leg, unspecified: Secondary | ICD-10-CM | POA: Insufficient documentation

## 2013-12-14 DIAGNOSIS — M79609 Pain in unspecified limb: Secondary | ICD-10-CM

## 2013-12-14 DIAGNOSIS — M216X9 Other acquired deformities of unspecified foot: Secondary | ICD-10-CM

## 2013-12-14 DIAGNOSIS — M7660 Achilles tendinitis, unspecified leg: Secondary | ICD-10-CM | POA: Insufficient documentation

## 2013-12-14 NOTE — Patient Instructions (Signed)
Pain in right heel and injured 4th digit left. May benefit from ankle support and orthotics for right heel pain. Will use compound cream for painful heel or painful toe on left.  Will call for Orthotic benefits.

## 2013-12-14 NOTE — Progress Notes (Signed)
Subjective: 58 year old male presents complaining of pain on right heel duration of several months. Recently hurt 4th toe left 2 weeks ago.  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 Wears safety shoes.  Objective: Right lateral heel pain. Swollen toe 4th left with mild erythema, only painful with prolonged ambulation. Status post right bunionectomy right. Hypermobile first ray bilateral. Tight achilles tendon right.  Rearfoot varus bilateral. STJ hyperpronation bilateral.  Assessment: Right lateral heel near Achilles tendon pain. Ankle equinus right. Hyperpronation both feet. Recent toe injury 4th left without affecting ambulation.   Plan: Reviewed findings and available options. Advised to get ankle support to prevent excess STJ pronation. May need to go back to Orthotics. He was not wearing due to ill fitting to his work shoe. We may need a new pair or adjust old one. Prescribed Compounding cream for painful heel.

## 2013-12-22 ENCOUNTER — Ambulatory Visit (INDEPENDENT_AMBULATORY_CARE_PROVIDER_SITE_OTHER): Payer: 59 | Admitting: Podiatry

## 2013-12-22 ENCOUNTER — Encounter: Payer: Self-pay | Admitting: Podiatry

## 2013-12-22 VITALS — BP 124/78 | HR 91

## 2013-12-22 DIAGNOSIS — M722 Plantar fascial fibromatosis: Secondary | ICD-10-CM

## 2013-12-22 DIAGNOSIS — M7660 Achilles tendinitis, unspecified leg: Secondary | ICD-10-CM

## 2013-12-22 DIAGNOSIS — M79606 Pain in leg, unspecified: Secondary | ICD-10-CM

## 2013-12-22 DIAGNOSIS — M216X9 Other acquired deformities of unspecified foot: Secondary | ICD-10-CM

## 2013-12-22 DIAGNOSIS — M79609 Pain in unspecified limb: Secondary | ICD-10-CM

## 2013-12-24 DIAGNOSIS — M722 Plantar fascial fibromatosis: Secondary | ICD-10-CM | POA: Insufficient documentation

## 2013-12-24 NOTE — Patient Instructions (Signed)
Both feet casted for orthotics. 

## 2013-12-24 NOTE — Progress Notes (Signed)
Patient came in to have orthotics prepared. X-rays done on both feet. Noted of markedly elevated first ray bilateral. Post [place] bunionectomy of right foot. Left has enlarged medial eminence of the first Metatarsal bone. Positive of plantar calcaneal spur bilateral.  Assessment: Plantar fasciitis right foot with hypermobile first ray bilateral. HAV with bunion left foot.   Plan:  Both feet casted for Orthotics.

## 2014-04-21 DIAGNOSIS — F329 Major depressive disorder, single episode, unspecified: Secondary | ICD-10-CM | POA: Insufficient documentation

## 2015-01-25 ENCOUNTER — Other Ambulatory Visit (HOSPITAL_COMMUNITY): Payer: Self-pay | Admitting: Psychiatry

## 2015-01-26 ENCOUNTER — Encounter (HOSPITAL_COMMUNITY): Payer: Self-pay | Admitting: Psychiatry

## 2015-01-26 NOTE — Progress Notes (Signed)
Psychiatric Assessment Adult  Patient Identification:  George Proctor Date of Evaluation:  01/26/2015 Chief Complaint: depressed for many years and has never responded to medication or therapy he says History of Chief Complaint:  George Proctor says he has been depressed all his life.  He brought a list of medications he had tried over the years and it included all the SSRI's ,SNRI's, buproprion, mirtazepine, aripiprazole, quetiapine and many others.  He has a history of ADHD and has tried all the stimulants with and without the combination of antidepressants without success.  His depression at times reaches suicidal ideation and twice attempts over the years.  Currently no suicidal thoughts.  He is sad, no interest in others or activities that might be fun, trouble sleeping, appetite is okay, decreased energy.  He has been this way for years.  Stress with relationship with wife and 39 year old step son.  Biggest stress is not being able to concentrate, finish tasks on time, plan and execute,follow through.  He is a bit of a perfectionist and that conflicts with the ADHD symptoms.  He finds no pleasure in life and cannot relax.  He worries a lot but mainly about getting things done.  He has always been considered an underachiever.  HPI Review of Systems Physical Exam  Depressive Symptoms: depressed mood, anhedonia, insomnia, fatigue, feelings of worthlessness/guilt, difficulty concentrating, impaired memory, anxiety,  (Hypo) Manic Symptoms:   Elevated Mood:  Negative Irritable Mood:  Yes Grandiosity:  Negative Distractibility:  Yes Labiality of Mood:  Yes Delusions:  Negative Hallucinations:  Negative Impulsivity:  Negative Sexually Inappropriate Behavior:  Negative Financial Extravagance:  Negative Flight of Ideas:  Negative  Anxiety Symptoms: Excessive Worry:  Negative Panic Symptoms:  Negative Agoraphobia:  Negative Obsessive Compulsive: Negative  Symptoms: na Specific Phobias:   Negative Social Anxiety:  Negative  Psychotic Symptoms:  Hallucinations: Negative Command:  na None Delusions:  negative Paranoia:  Negative   Ideas of Reference:  Negative  PTSD Symptoms: Ever had a traumatic exposure:  Negative Had a traumatic exposure in the last month:   Re-experiencing: Negative None Hypervigilance:  Negative Hyperarousal: Negative None Avoidance: Negative None  Traumatic Brain Injury: Negative na  Past Psychiatric History: Diagnosis: major depression and ADHD  Hospitalizations: one at aged 59  Outpatient Care: sees psychiatrist  Substance Abuse Care: none  Self-Mutilation: none  Suicidal Attempts: 2 none recently  Violent Behaviors: none   Past Medical History:   Past Medical History  Diagnosis Date  . Depression    History of Loss of Consciousness:  Negative Seizure History:  Negative Cardiac History:  Negative Allergies:  No Known Allergies Current Medications:  Current Outpatient Prescriptions  Medication Sig Dispense Refill  . fish oil-omega-3 fatty acids 1000 MG capsule Take 6 g by mouth daily.     . Multiple Vitamin (MULTIVITAMIN) capsule Take 1 capsule by mouth daily.    . Omeprazole (PRILOSEC PO) Take 10 mg by mouth as needed.      No current facility-administered medications for this visit.    Previous Psychotropic Medications:  Medication Dose   see list above  na                     Substance Abuse History in the last 12 months:none Substance Age of 1st Use Last Use Amount Specific Type  Nicotine  na  na  na  na  Alcohol  na  na  na  na  Cannabis  na  nA  na  na  Opiates  na  na  na  na  Cocaine  na  na  na  na  Methamphetamines  na  na  na  na  LSD  na  na  na  na  Ecstasy  na   na  na  na  Benzodiazepines  na  na  na  na  Caffeine  na  na  na  na  Inhalants  na  na  na  na  Others:                          Medical Consequences of Substance Abuse: none  Legal Consequences of Substance Abuse:  none  Family Consequences of Substance Abuse: none  Blackouts:  Negative DT's:  Negative Withdrawal Symptoms:  Negative na  Social History: Current Place of Residence: [place] Place of Birth: [place] Family Members: wife Marital Status:  Married Children: 1 stepson  Sons: 0  Daughters: 0 Relationships: wife Education:  some college Educational Problems/Performance: poor Religious Beliefs/Practices: none reported  History of Abuse: none Ship broker History:  None. Legal History: none Hobbies/Interests: none  Family History:   Family History  Problem Relation Age of Onset  . Aneurysm Mother 44    Thoracic    Mental Status Examination/Evaluation: Objective:  Appearance: Casual  Eye Contact::  Good  Speech:  Clear and Coherent  Volume:  Normal  Mood:  depressed  Affect:  Congruent  Thought Process:  Coherent  Orientation:  Full (Time, Place, and Person)  Thought Content:  Negative  Suicidal Thoughts:  No  Homicidal Thoughts:  No  Judgement:  Intact  Insight:  Shallow  Psychomotor Activity:  Normal  Akathisia:    Handed:  Right  AIMS (if indicated):  0      Laboratory/X-Ray Psychological Evaluation(s)   na  na   Assessment:  Major depression, recurrent, severe without psychotic features.  ADHD inattentive  AXIS I Dysthymic Disorder and na  AXIS II na  AXIS III Past Medical History  Diagnosis Date  . Depression      AXIS IV na  AXIS V na   Treatment Plan/Recommendations:  Plan of Care: asking for [place] treatment, not clearly severely depressed but because of the chronicity and unresponsiveness to medication I am labeling him Major depression.  The ADHD  Symptoms seem to be his most prominent feature.  Cannot rule out personality disorder at this point.  He has been treated with bipolar meds without success in the past.  He currently is taking no prescribed meds as nothing has ever worked he says.  Laboratory:  na  Psychotherapy: none  currently  Medications: taking none  Routine PRN Medications:  Yes  Consultations: none  Safety Concerns:  none  Other:      Clarene Reamer, MD 5/12/20169:02 AM

## 2015-02-17 ENCOUNTER — Telehealth (HOSPITAL_COMMUNITY): Payer: Self-pay | Admitting: *Deleted

## 2015-02-17 NOTE — Telephone Encounter (Signed)
Pt returned writer's previous call. Writer administer PHQ-9 over the phone to determine pt's current depression severity. This was done as follow up from Transcranial Magnetic Stimulation consultation conducted on 01/25/15. Pt scored 21 on the PHQ-9 (severe depression). Pt now meets criteria for [place] treatment for Major Depressive Disorder. On the PHQ-9, pt endorsed SI nearly every day for the past 2 weeks, so writer followed up on the pt's response to this question. Pt elaborate, stating that he usually just wishes he had never been born, although at times the thought crosses his mind that he should kill himself. Pt stated that when these thoughts cross his mind, it is difficult for him to stop thinking about it. Pt reported that he currently does not have a plan. When asked if he has intent to harm himself, pt responded,"probably not." Writer asked pt to elaborate on this statement. Pt stated, "I mean, I don't own a gun or anything, so I don't really have a way to do it." Pt thus denied access to means to kill himself. Pt verbally committed to this writer that he would not harm himself, and if he began to experience increased SI, he would seek help. Writer and pt established a plan for the pt to call 911 or report to the nearest ED in the event his SI increases. Pt verbally agreed to this plan.

## 2015-03-09 ENCOUNTER — Other Ambulatory Visit (HOSPITAL_COMMUNITY): Payer: 59 | Attending: Psychiatry | Admitting: Psychiatry

## 2015-03-09 VITALS — BP 109/71 | HR 88 | Ht 73.0 in | Wt 188.6 lb

## 2015-03-09 DIAGNOSIS — F329 Major depressive disorder, single episode, unspecified: Secondary | ICD-10-CM | POA: Insufficient documentation

## 2015-03-09 DIAGNOSIS — F332 Major depressive disorder, recurrent severe without psychotic features: Secondary | ICD-10-CM

## 2015-03-10 ENCOUNTER — Other Ambulatory Visit (INDEPENDENT_AMBULATORY_CARE_PROVIDER_SITE_OTHER): Payer: 59 | Admitting: *Deleted

## 2015-03-10 VITALS — BP 125/82 | HR 82

## 2015-03-10 DIAGNOSIS — F332 Major depressive disorder, recurrent severe without psychotic features: Secondary | ICD-10-CM | POA: Diagnosis not present

## 2015-03-10 DIAGNOSIS — F329 Major depressive disorder, single episode, unspecified: Secondary | ICD-10-CM | POA: Diagnosis present

## 2015-03-10 NOTE — Progress Notes (Unsigned)
Patient ID: George Proctor, male   DOB: [birth year], 57 y.o.   MRN: [id number] Pt reported to [facility] for Motor Threshold Determination procedure for Repetitive Transcranial Magnetic Stimulation treatment for Major Depressive Disorder. Pt also received his first treatment. Pt reported no change in medication regimen, alcohol/substance use, caffeine consumption, sleep pattern, or metal implant status since previous visit to clinic. Pt completed a PHQ-9 prior to procedure with a score of 22 (severe depression). Pt also completed a Beck's Depression Inventory with a score of 33 (severe depression). Pt was seated in [place] tx chair and head support system was adjusted to fit pt. Treatment location was determined by applying pulses anterior/posterior and along the superior oblique angle in the region of the pt's left motor cortex in order to find the area that elicited the best motor response in pt's right thumb. Tx location (left dl-PFC) is 5.5cm anterior to this location. Motor Threshold determined by using MT Assist algorithm proprietary to [employer] [place] machine. According to these procedures, pt's tx parameters are as follows: A/P -- 12.5 cm, SOA -- 30 degrees, Coil Angle -- +10 degrees, MT Level -- 0.79 SMT. Per MD, pt will receive high-frequency TMS to his left dorsolateral prefrontal cortex per the following parameters: 3000 pulses per session, 4 seconds of stimulation, 10 Hz stimulation frequency, 26 second rest intervals, at 120 %MT. After calculating pt's tx parameters, pt also received his first session of tx. Pt tolerated tx well. Began tx at a level of 80 %MT, but  titrated up to 100 %MT by end of tx session. Will titrate up to 120% by end of following session. Pt with no complaints or side effects post-tx. Pt departed from clinic without issue.

## 2015-03-10 NOTE — Progress Notes (Signed)
Patient ID: George Proctor, male   DOB: 10-10-1955, 59 y.o.   MRN: [id number] Pt reported to [facility] for Repetitive Transcranial Magnetic Stimulation treatment for Major Depressive Disorder. Pt reported that he did not experience any pain or discomfort in the treatment area or any other side effect yesterday evening after receiving his first session of TMS tx. Pt reported no change in medication regimen, alcohol/substance use, caffeine consumption, sleep pattern, or metal implant status since previous tx. Pt tolerated tx well. %MT titrated up to 120% were it will remain for the remainder of the course of tx. Pt stated he could barely tell a difference in the increase in strength of stimulation. Pt with no complaints post-tx. Pt departed from clinic without issue.  I agree with assessment and plan Geralyn Flash A. Sabra Heck, M.D.

## 2015-03-13 ENCOUNTER — Other Ambulatory Visit (INDEPENDENT_AMBULATORY_CARE_PROVIDER_SITE_OTHER): Payer: 59 | Admitting: *Deleted

## 2015-03-13 VITALS — BP 115/74 | HR 75

## 2015-03-13 DIAGNOSIS — F332 Major depressive disorder, recurrent severe without psychotic features: Secondary | ICD-10-CM

## 2015-03-13 DIAGNOSIS — F329 Major depressive disorder, single episode, unspecified: Secondary | ICD-10-CM | POA: Diagnosis not present

## 2015-03-13 NOTE — Progress Notes (Signed)
Patient ID: George Proctor, male   DOB: 1956/01/20, 59 y.o.   MRN: [id number] Pt reported to [facility] for Repetitive Transcranial Magnetic Stimulation treatment for Major Depressive Disorder. Pt reported no change in medication regimen, alcohol/substance use, caffeine consumption, sleep pattern, or metal implant status since previous tx. Pt tolerated tx well. %MT remained at 120% for the duration of tx. At outset of tx, pt experienced excessive amounts of twitching in the musculature around his left eye brow. Pt also stated that the stimulation felt more intense and the coil did not feel like it was in the correct place. Proper position of pt's head and tx coil was verified numerous times, but the same issues persisted. To address pt discomfort, coil angle was increased by 5 degrees in the positive direction to +15 degrees. Twitching and discomfort persisted, so coil angle was increased again to +20 degrees. In this position, pt reported that the stimulation was much more tolerable. Pt with no complaints post-tx. Pt departed from clinic without issue.

## 2015-03-14 ENCOUNTER — Other Ambulatory Visit (INDEPENDENT_AMBULATORY_CARE_PROVIDER_SITE_OTHER): Payer: 59 | Admitting: *Deleted

## 2015-03-14 VITALS — BP 109/73 | HR 73

## 2015-03-14 DIAGNOSIS — F332 Major depressive disorder, recurrent severe without psychotic features: Secondary | ICD-10-CM | POA: Diagnosis not present

## 2015-03-14 NOTE — Progress Notes (Signed)
Patient ID: George Proctor, male   DOB: [birth year], 59 y.o.   MRN: [id number] Pt reported to [facility] for Repetitive Transcranial Magnetic Stimulation treatment for Major Depressive Disorder. Pt reported no change in medication regimen, alcohol/substance use, caffeine consumption, sleep pattern, or metal implant status since previous tx. Pt tolerated tx well. At outset of tx, pt stated that he could feel an odd sensation in the left side of his face during stimulation. Coil angle was decreased 5 degrees to +15 degrees in an effort to reduce this. Pt stated that this provided no change in the sensation. Writer then verified that pt's head was still properly position, which it was not, so proper head positioning was re-established and coil angle was adjusted back to +20 degrees. Pt stated that this reduced the sensation in the left side of his face and that the stimulation was tolerable after this intervention. %MT remained at 120% for the duration of tx. Pt with no complaints post-tx. Pt departed from clinic without issue.  I agree with assessment and plan Geralyn Flash A. Sabra Heck, M.D.

## 2015-03-15 ENCOUNTER — Other Ambulatory Visit (INDEPENDENT_AMBULATORY_CARE_PROVIDER_SITE_OTHER): Payer: 59 | Admitting: *Deleted

## 2015-03-15 VITALS — BP 124/75 | HR 75

## 2015-03-15 DIAGNOSIS — F332 Major depressive disorder, recurrent severe without psychotic features: Secondary | ICD-10-CM

## 2015-03-15 DIAGNOSIS — F329 Major depressive disorder, single episode, unspecified: Secondary | ICD-10-CM | POA: Diagnosis not present

## 2015-03-15 NOTE — Progress Notes (Signed)
Patient ID: George Proctor, male   DOB: Apr 19, 1956, 59 y.o.   MRN: [id number] Pt reported to [facility] for Repetitive Transcranial Magnetic Stimulation treatment for Major Depressive Disorder. Pt reported no change in medication regimen, alcohol/substance use, caffeine consumption, sleep pattern, or metal implant status since previous tx. Pt tolerated tx well. Moderate twitching noted in pt's left eyebrow during stimulation; however, pt stated that stimulation was tolerable, so no adjustments were made. %MT remained at 120% for the duration of tx. Pt with no complaints post-tx. Pt departed from clinic without issue.  I agree with assessment and plan Geralyn Flash A. Sabra Heck, M.D.

## 2015-03-16 ENCOUNTER — Other Ambulatory Visit (INDEPENDENT_AMBULATORY_CARE_PROVIDER_SITE_OTHER): Payer: 59 | Admitting: *Deleted

## 2015-03-16 VITALS — BP 122/70 | HR 72

## 2015-03-16 DIAGNOSIS — F332 Major depressive disorder, recurrent severe without psychotic features: Secondary | ICD-10-CM

## 2015-03-16 DIAGNOSIS — F329 Major depressive disorder, single episode, unspecified: Secondary | ICD-10-CM | POA: Diagnosis not present

## 2015-03-16 NOTE — Progress Notes (Signed)
Patient ID: George Proctor, male   DOB: 11/30/1955, 59 y.o.   MRN: [id number] Pt reported to [facility] for Repetitive Transcranial Magnetic Stimulation treatment for Major Depressive Disorder. Pt reported no change in medication regimen, alcohol/substance use, caffeine consumption, sleep pattern, or metal implant status since previous tx. Pt reported that he has been experiencing some fatigue in the evenings after [place] tx sessions, although he is not sure if this is directly associated with [place]. Writer assured pt that this is normal, as TMS increased glucose metabolism in the brain. Will inform MD. Pt tolerated tx well. Pt again experienced mild to moderate twitching in his left eyebrow during stimulation, but he stated the stimulation was tolerable, so no adjustments were made. %MT remained at 120% for the duration of tx. Pt with no complaints post-tx. Pt departed from clinic without issue.  I agree with assessment and plan Geralyn Flash A. Sabra Heck, M.D.

## 2015-03-17 ENCOUNTER — Other Ambulatory Visit (HOSPITAL_COMMUNITY): Payer: 59 | Attending: Psychiatry | Admitting: *Deleted

## 2015-03-17 VITALS — BP 118/77 | HR 75

## 2015-03-17 DIAGNOSIS — F332 Major depressive disorder, recurrent severe without psychotic features: Secondary | ICD-10-CM

## 2015-03-17 DIAGNOSIS — F329 Major depressive disorder, single episode, unspecified: Secondary | ICD-10-CM | POA: Insufficient documentation

## 2015-03-17 NOTE — Progress Notes (Signed)
Patient ID: George Proctor, male   DOB: 1956/01/22, 59 y.o.   MRN: [id number] Pt reported to [facility] for Repetitive Transcranial Magnetic Stimulation treatment for Major Depressive Disorder. Pt reported no change in medication regimen, alcohol/substance use, caffeine consumption, sleep pattern, or metal implant status since previous tx. Pt completed a PHQ-9 with a score of 20 (severe depression).This is a slight decrease from pt's baseline score of 22 taken the previous week. Further tx is warranted to promote further reduction in pt's depressive symptoms. Pt tolerated tx well. %MT remained at 120% for the duration of tx. Pt with no complaints post-tx. Pt departed from clinic without issue.  I agree with assessment and plan Geralyn Flash A. Sabra Heck, M.D.

## 2015-03-21 ENCOUNTER — Other Ambulatory Visit (HOSPITAL_COMMUNITY): Payer: 59 | Admitting: *Deleted

## 2015-03-21 VITALS — BP 121/83 | HR 98

## 2015-03-21 DIAGNOSIS — F332 Major depressive disorder, recurrent severe without psychotic features: Secondary | ICD-10-CM

## 2015-03-21 NOTE — Progress Notes (Addendum)
Patient ID: George Proctor, male   DOB: 1955-11-06, 59 y.o.   MRN: [id number] Pt reported to [facility] for Repetitive Transcranial Magnetic Stimulation treatment for Major Depressive Disorder. Pt presented today with somewhat brighter affect compared to his baseline. Pt reported no change in medication regimen, alcohol/substance use, caffeine consumption, sleep pattern, or metal implant status since previous tx. Pt tolerated tx well. %MT remained at 120% for the duration of tx. Pt with no complaints post-tx. Pt departed from clinic without issue.  I agree with assessment and plan Geralyn Flash A. Sabra Heck, M.D.

## 2015-03-22 ENCOUNTER — Other Ambulatory Visit (INDEPENDENT_AMBULATORY_CARE_PROVIDER_SITE_OTHER): Payer: 59 | Admitting: *Deleted

## 2015-03-22 VITALS — BP 117/74 | HR 73

## 2015-03-22 DIAGNOSIS — F332 Major depressive disorder, recurrent severe without psychotic features: Secondary | ICD-10-CM | POA: Diagnosis not present

## 2015-03-22 DIAGNOSIS — F329 Major depressive disorder, single episode, unspecified: Secondary | ICD-10-CM | POA: Diagnosis not present

## 2015-03-22 NOTE — Progress Notes (Signed)
Patient ID: George Proctor, male   DOB: Feb 04, 1956, 59 y.o.   MRN: [id number] Pt reported to [facility] for Repetitive Transcranial Magnetic Stimulation treatment for Major Depressive Disorder. Pt reported no change in medication regimen, alcohol/substance use, caffeine consumption, sleep pattern, or metal implant status since previous tx. Pt reported that he has been feeling much sleepier during the day since beginning [place] tx. Pt denied any other major changes in his life recently other than beginning [place]. Will notify MD. Pt tolerated tx well. %MT remained at 120% for the duration of tx. Pt with no complaints post-tx. Pt departed from clinic without issue.  I agree with assessment and plan Geralyn Flash A. Sabra Heck, M.D.

## 2015-03-23 ENCOUNTER — Other Ambulatory Visit (INDEPENDENT_AMBULATORY_CARE_PROVIDER_SITE_OTHER): Payer: 59 | Admitting: *Deleted

## 2015-03-23 VITALS — BP 122/73 | HR 80

## 2015-03-23 DIAGNOSIS — F329 Major depressive disorder, single episode, unspecified: Secondary | ICD-10-CM | POA: Diagnosis not present

## 2015-03-23 DIAGNOSIS — F332 Major depressive disorder, recurrent severe without psychotic features: Secondary | ICD-10-CM

## 2015-03-23 NOTE — Progress Notes (Signed)
Patient ID: George Proctor, male   DOB: 04-26-1956, 59 y.o.   MRN: [id number]  Pt reported to [place] [facility] forTranscranial Magnetic Stimulation for Major Depressive Disorder. Pt presented with flat affect, but brightened upon interaction. Pt reported no change in metal implant status, sleep pattern, appetite/intake or alcohol/substance use. Pt reported "2-3 more cups of tea than usual throughout the day today". Pt stated this did not help with his drowsiness. Pt tolerated treatment. %MT remained at 120% for duration of treatment. Pt with no complaints post treatment. Pt departed from clinic without issue.  I agree with assessment and plan Geralyn Flash A. Sabra Heck, M.D.

## 2015-03-24 ENCOUNTER — Other Ambulatory Visit (INDEPENDENT_AMBULATORY_CARE_PROVIDER_SITE_OTHER): Payer: 59 | Admitting: *Deleted

## 2015-03-24 VITALS — BP 117/72 | HR 75

## 2015-03-24 DIAGNOSIS — F329 Major depressive disorder, single episode, unspecified: Secondary | ICD-10-CM | POA: Diagnosis not present

## 2015-03-24 DIAGNOSIS — F332 Major depressive disorder, recurrent severe without psychotic features: Secondary | ICD-10-CM | POA: Diagnosis not present

## 2015-03-24 NOTE — Progress Notes (Signed)
Patient ID: George Proctor, male   DOB: 1956-02-15, 59 y.o.   MRN: [id number] Pt reported to [facility] for Repetitive Transcranial Magnetic Stimulation treatment for Major Depressive Disorder. Pt reported no change in medication regimen, alcohol/substance use, caffeine consumption, sleep pattern, or metal implant status since previous tx. Pt completed a PHQ-9 with a score of 20 (severe depression). Pt also completed a Beck's depression Inventory with a score of 28 (moderate depression), which is reduced from his baseline score of 33. Pt reported that he has not experienced a significant improvement in his mood since beginning [place] tx. Writer encouraged pt that it is still early in his course of tx, and significant gains are often not seen until later. Pt tolerated tx well. %MT remained at 120% for the duration of tx. Pt with no complaints post-tx. Pt departed from clinic without issue.  I agree with assessment and plan Geralyn Flash A. Sabra Heck, M.D.

## 2015-03-27 ENCOUNTER — Other Ambulatory Visit (INDEPENDENT_AMBULATORY_CARE_PROVIDER_SITE_OTHER): Payer: 59 | Admitting: *Deleted

## 2015-03-27 VITALS — BP 113/67 | HR 82

## 2015-03-27 DIAGNOSIS — F332 Major depressive disorder, recurrent severe without psychotic features: Secondary | ICD-10-CM | POA: Diagnosis not present

## 2015-03-27 DIAGNOSIS — F329 Major depressive disorder, single episode, unspecified: Secondary | ICD-10-CM | POA: Diagnosis not present

## 2015-03-27 NOTE — Progress Notes (Signed)
Patient ID: George Proctor, male   DOB: 04/16/1956, 59 y.o.   MRN: [id number] Pt reported to [facility] for Repetitive Transcranial Magnetic Stimulation treatment for Major Depressive Disorder. Pt reported no change in medication regimen, alcohol/substance use, caffeine consumption, sleep pattern, or metal implant status since previous tx. Pt stated that he continues to experience severe sleepiness/drowsiness during the daytime, and he is wondering if this could have anything to do with [name] and what can be done to address this. Will notify MD. Pt tolerated tx well. %MT remained at 120% for the duration of tx. Pt with no complaints post-tx. Pt departed from clinic without issue.  I agree with assessment and plan George Proctor, M.D.

## 2015-03-28 ENCOUNTER — Other Ambulatory Visit (HOSPITAL_COMMUNITY): Payer: 59 | Admitting: *Deleted

## 2015-03-28 VITALS — BP 122/70 | HR 82

## 2015-03-28 DIAGNOSIS — F332 Major depressive disorder, recurrent severe without psychotic features: Secondary | ICD-10-CM

## 2015-03-28 NOTE — Progress Notes (Signed)
Patient ID: George Proctor, male   DOB: 24-Dec-1955, 59 y.o.   MRN: [id number] Pt reported to [facility] for Repetitive Transcranial Magnetic Stimulation treatment for Major Depressive Disorder. Pt continues to present with flat affect that brightens upon interaction, but the pt's affect brightened to an even greater degree today and the previous day, as well. Pt reported no change in medication regimen, alcohol/substance use, caffeine consumption,  or metal implant status since previous tx. Pt reported that he had difficulty sleeping last night, stating that he woke up in the middle of the night with a headache, and had difficulty returning to sleep. Pt has been complaining of excessive daytime sleepiness that began about the same time he started [place]. Writer relayed information given to him by MD, stating that it is possible but not likely that this is being caused by the [place]. Writer also discussed with pt (per writer's conversation with MD) that the pt's options to treat his sleepiness including improving sleep hygiene, exercise, nutrition, or medication. Writer informed pt that he would need to see his prescribing psychiatrist for any medication changes. Pt verbalized understanding and stated that he will be seeing his PCP soon, and he plans to discuss this issue with that provider, as well. Pt completed a PHQ-9 for the purpose of insurance concurrent authorization for more [place]. Pt scored a 19 (moderately severe depression) which is a slight decrease from the previous week's score of 20 and signifies a categorical shift from severe depression to moderately severe depression, per the PHQ-9 rating scale. More [place] tx is warranted in order to further the pt's benefit from [place] tx. Pt tolerated tx well. %MT remained at 120% for the duration of tx. Pt with no complaints post-tx. Pt departed from clinic without issue.  I agree with assessment and plan Geralyn Flash A. Sabra Heck, M.D.

## 2015-03-29 ENCOUNTER — Other Ambulatory Visit (INDEPENDENT_AMBULATORY_CARE_PROVIDER_SITE_OTHER): Payer: 59 | Admitting: Emergency Medicine

## 2015-03-29 VITALS — BP 127/84 | HR 85

## 2015-03-29 DIAGNOSIS — F329 Major depressive disorder, single episode, unspecified: Secondary | ICD-10-CM | POA: Diagnosis not present

## 2015-03-29 DIAGNOSIS — F332 Major depressive disorder, recurrent severe without psychotic features: Secondary | ICD-10-CM | POA: Diagnosis not present

## 2015-03-29 NOTE — Progress Notes (Signed)
Patient ID: George Proctor, male   DOB: [birth year], 59 y.o.   MRN: [id number] Pt reported to [facility] for Transcranial Magnetic Stimulation for Major Depressive Disorder. Pt reported no change in medication regimen, sleep pattern, appetite/intake, metal implant status, or alcohol/substance abuse since previous treatment. However, pt had slight changes in caffeine intake - stated that he drank two large cans of tea and two cups of coffee. Pt tolerated treatment well. Pt initiated conversation with staff member whereas normally he would wait for staff to initiate conversation. %MT remained at 120% for the duration of treatment. Pt had no complaints post treatment. Pt departed clinic without issue.  I agree with assessment and plan Geralyn Flash A. Sabra Heck, M.D.

## 2015-03-30 ENCOUNTER — Other Ambulatory Visit (HOSPITAL_COMMUNITY): Payer: 59 | Admitting: *Deleted

## 2015-03-30 VITALS — BP 118/69 | HR 88

## 2015-03-30 DIAGNOSIS — F332 Major depressive disorder, recurrent severe without psychotic features: Secondary | ICD-10-CM

## 2015-03-30 NOTE — Progress Notes (Signed)
Patient ID: George Proctor, male   DOB: 1956/07/18, 59 y.o.   MRN: [id number] Pt reported to [facility] for Repetitive Transcranial Magnetic Stimulation treatment for Major Depressive Disorder. This is the final session in the initial authorization provided by the pt's insurance. Tx must be postponed until word is received from pt's insurance re: concurrent auth request which was sent in 03/24/15. Pt reported no change in medication regimen, alcohol/substance use, caffeine consumption, sleep pattern, or metal implant status since previous tx. Pt tolerated tx well. %MT remained at 120% for the duration of tx. Pt with no complaints post-tx. Pt departed from clinic without issue.  I agree with assessment and plan Geralyn Flash A. Sabra Heck, M.D.

## 2015-03-31 ENCOUNTER — Other Ambulatory Visit (INDEPENDENT_AMBULATORY_CARE_PROVIDER_SITE_OTHER): Payer: 59 | Admitting: *Deleted

## 2015-03-31 VITALS — BP 106/66 | HR 72 | Ht 73.0 in | Wt 192.2 lb

## 2015-03-31 DIAGNOSIS — F329 Major depressive disorder, single episode, unspecified: Secondary | ICD-10-CM | POA: Diagnosis not present

## 2015-03-31 DIAGNOSIS — F332 Major depressive disorder, recurrent severe without psychotic features: Secondary | ICD-10-CM

## 2015-03-31 NOTE — Progress Notes (Signed)
Patient ID: George Proctor, male   DOB: 10-Jan-1956, 59 y.o.   MRN: [id number] Pt reported to [facility] for Repetitive Transcranial Magnetic Stimulation treatment for Major Depressive Disorder. Pt reported no change in medication regimen, alcohol/substance use, caffeine consumption, sleep pattern, or metal implant status since previous tx. Writer informed pt that his treatment parameters would be rechecked for accuracy at his next treatment session, per discussion with MD, as the pt's mood has not improved significantly since starting [place] tx. Writer also encouraged pt to restart a regimen of antidepressant medication, also per previous discussion with MD. Pt stated that no medication he has ever tried has helped his mood at all. Writer encouraged pt that, as TMS changes the way neurotransmitter are released in the brain, medications that may not have worked in the past may work alongside or after receiving [place] tx. Pt stated that he understood, and he plans to contact his prescribing psychiatrist to discuss the matter. Pt tolerated tx well. %MT remained at 120% for the duration of tx. Pt with no complaints post-tx. Pt departed from clinic without issue. I agree with assessment and plan Geralyn Flash A. Sabra Heck, M.D.

## 2015-04-03 ENCOUNTER — Other Ambulatory Visit (INDEPENDENT_AMBULATORY_CARE_PROVIDER_SITE_OTHER): Payer: 59 | Admitting: *Deleted

## 2015-04-03 VITALS — BP 122/80 | HR 80

## 2015-04-03 DIAGNOSIS — F332 Major depressive disorder, recurrent severe without psychotic features: Secondary | ICD-10-CM | POA: Diagnosis not present

## 2015-04-03 DIAGNOSIS — F329 Major depressive disorder, single episode, unspecified: Secondary | ICD-10-CM | POA: Diagnosis not present

## 2015-04-03 NOTE — Progress Notes (Signed)
Patient ID: George Proctor, male   DOB: Sep 18, 1955, 59 y.o.   MRN: [id number]   Pt reported to [facility] forTranscranial Magnetic Stimulation for Major Depressive Disorder. Pt reported no change in metal implant status, sleep pattern, appetite/intake, caffeine consumption or alcohol/substance use. Pt tolerated treatment. %MT remained at 120% for duration of treatment. Pt with no complaints post treatment. Pt departed from clinic without issue. I personally assessed the patient and formulated the plan Geralyn Flash A. Sabra Heck, M.D.            Estell Harpin K 04/03/2015 5:38 PM

## 2015-04-04 ENCOUNTER — Other Ambulatory Visit (INDEPENDENT_AMBULATORY_CARE_PROVIDER_SITE_OTHER): Payer: 59 | Admitting: Emergency Medicine

## 2015-04-04 VITALS — BP 128/78 | HR 81

## 2015-04-04 DIAGNOSIS — F332 Major depressive disorder, recurrent severe without psychotic features: Secondary | ICD-10-CM

## 2015-04-04 DIAGNOSIS — F329 Major depressive disorder, single episode, unspecified: Secondary | ICD-10-CM | POA: Diagnosis not present

## 2015-04-04 NOTE — Progress Notes (Signed)
Patient ID: George Proctor, male   DOB: 03-03-1956, 59 y.o.   MRN: [id number] Pt reported to [facility] for Transcranial Magnetic Stimulation for Major Depressive Disorder. Pt reported no change in medication regimen, sleep pattern, appetite/intake, metal implant status, or alcohol/substance abuse since previous treatment. However, pt had  changes in caffeine intake - stated that he has been consuming more caffeine than usual. Pt tolerated treatment well. Pt watched TV to stay awake and held minor conversation with tech. %MT remained at 120% for the duration of treatment. Pt had no complaints post treatment. Pt departed clinic without issue.

## 2015-04-05 ENCOUNTER — Other Ambulatory Visit (INDEPENDENT_AMBULATORY_CARE_PROVIDER_SITE_OTHER): Payer: 59 | Admitting: Emergency Medicine

## 2015-04-05 VITALS — BP 129/78 | HR 87

## 2015-04-05 DIAGNOSIS — F329 Major depressive disorder, single episode, unspecified: Secondary | ICD-10-CM | POA: Diagnosis not present

## 2015-04-05 DIAGNOSIS — F332 Major depressive disorder, recurrent severe without psychotic features: Secondary | ICD-10-CM | POA: Diagnosis not present

## 2015-04-05 NOTE — Progress Notes (Signed)
Patient ID: George Proctor, male   DOB: 1956-07-06, 59 y.o.   MRN: [id number] Pt reported to [facility] for Transcranial Magnetic Stimulation for Major Depressive Disorder. Pt reported no change in medication regimen, sleep pattern, appetite/intake, metal implant status, or alcohol/substance abuse since previous treatment. However, pt hadchanges in caffeine intake - stated that he has been consuming more caffeine than usual. Pt stated he has increased to four cups (2 coffees and 2 teas). Pt reported that the caffeine intake has not been helping his energy level to conduct daily routine. Pt tolerated treatment well. Pt watched TV to stay awake. %MT remained at 120% for the duration of treatment. Pt had no complaints post treatment. Pt departed clinic without issue.

## 2015-04-06 ENCOUNTER — Other Ambulatory Visit (INDEPENDENT_AMBULATORY_CARE_PROVIDER_SITE_OTHER): Payer: 59 | Admitting: Emergency Medicine

## 2015-04-06 VITALS — BP 128/82 | HR 93

## 2015-04-06 DIAGNOSIS — F332 Major depressive disorder, recurrent severe without psychotic features: Secondary | ICD-10-CM | POA: Diagnosis not present

## 2015-04-06 DIAGNOSIS — F329 Major depressive disorder, single episode, unspecified: Secondary | ICD-10-CM | POA: Diagnosis not present

## 2015-04-06 NOTE — Progress Notes (Signed)
Patient ID: George Proctor, male   DOB: 05-16-1956, 59 y.o.   MRN: [id number] Pt reported to [facility] for Transcranial Magnetic Stimulation for Major Depressive Disorder. Pt reported no change in medication regimen, sleep pattern, appetite/intake, metal implant status, or alcohol/substance abuse since previous treatment. Pt tolerated treatment well. Pt watched TV to stay awake and held  conversation with tech. Pt expressed interest in quitting caffeine intake. %MT remained at 120% for the duration of treatment. Pt had no complaints post treatment. Pt departed clinic without issue.

## 2015-04-07 ENCOUNTER — Other Ambulatory Visit (INDEPENDENT_AMBULATORY_CARE_PROVIDER_SITE_OTHER): Payer: 59 | Admitting: Emergency Medicine

## 2015-04-07 VITALS — BP 124/79 | HR 78

## 2015-04-07 DIAGNOSIS — F332 Major depressive disorder, recurrent severe without psychotic features: Secondary | ICD-10-CM

## 2015-04-07 DIAGNOSIS — F329 Major depressive disorder, single episode, unspecified: Secondary | ICD-10-CM | POA: Diagnosis not present

## 2015-04-07 NOTE — Progress Notes (Signed)
Patient ID: George Proctor, male   DOB: 1956-04-17, 59 y.o.   MRN: [id number] Pt reported to [facility] for Transcranial Magnetic Stimulation for Major Depressive Disorder. Pt reported no change in medication regimen, sleep pattern, appetite/intake, metal implant status, or alcohol/substance abuse since previous treatment. Pt tolerated treatment well. Pt watched TV to stay awake and heldconversation with tech. Pt and staff member discussed ways to stay energized - working out and incorporating physical active in daily routine. %MT remained at 120% for the duration of treatment. Pt had no complaints post treatment. Pt departed clinic without issue.

## 2015-04-10 ENCOUNTER — Other Ambulatory Visit (INDEPENDENT_AMBULATORY_CARE_PROVIDER_SITE_OTHER): Payer: 59 | Admitting: *Deleted

## 2015-04-10 VITALS — BP 119/78 | HR 89

## 2015-04-10 DIAGNOSIS — F332 Major depressive disorder, recurrent severe without psychotic features: Secondary | ICD-10-CM

## 2015-04-10 DIAGNOSIS — F329 Major depressive disorder, single episode, unspecified: Secondary | ICD-10-CM | POA: Diagnosis not present

## 2015-04-10 NOTE — Progress Notes (Signed)
Patient ID: George Proctor, male   DOB: [birth year], 59 y.o.   MRN: [id number]  Pt reported to [place] [facility] forTranscranial Magnetic Stimulation for Major Depressive Disorder. Pt reported no change in metal implant status, sleep pattern, caffeine consumption or alcohol/substance use. Pt did report that he consumed a few more energy drinks than normal today, to help him stay awake for his session this afternoon. Pt tolerated treatment this afternoon. %MT remained at 120% for duration of treatment. Pt had no complaints post treatment. Pt departed from clinic without issue.   I agree with assessment and plan Geralyn Flash A. Sabra Heck, M.D.        Estell Harpin K 04/10/2015 5:30 PM

## 2015-04-11 ENCOUNTER — Other Ambulatory Visit (INDEPENDENT_AMBULATORY_CARE_PROVIDER_SITE_OTHER): Payer: 59 | Admitting: *Deleted

## 2015-04-11 VITALS — BP 121/71 | HR 75

## 2015-04-11 DIAGNOSIS — F329 Major depressive disorder, single episode, unspecified: Secondary | ICD-10-CM | POA: Diagnosis not present

## 2015-04-11 DIAGNOSIS — F332 Major depressive disorder, recurrent severe without psychotic features: Secondary | ICD-10-CM

## 2015-04-11 NOTE — Progress Notes (Addendum)
Patient ID: George Proctor, male   DOB: [birth year], 59 y.o.   MRN: [id number] Pt reported to [facility] for Repetitive Transcranial Magnetic Stimulation treatment for Major Depressive Disorder. Pt reported no change in medication regimen, alcohol/substance use, or metal implant status since previous tx. Pt reported that he continues to not feel rested after sleeping all night. Pt stated he consumed an increased level of caffeine today as a result (4 cups of coffee, 2 cans of tea). Writer encouraged pt to address this issue with his prescribing psychiatrist and/or PCP. Pt said he would. Pt completed a PHQ-9 with a score of 17 (moderately severe depression). This is a slight decrease from the pt's previous score of 19 taken on 03/28/15. Pt tolerated tx well. %MT remained at 120% for the duration of tx. Pt with no complaints post-tx. Pt departed from clinic without issue.  I agree with assessment and plan Geralyn Flash A. Sabra Heck, M.D.

## 2015-04-12 ENCOUNTER — Other Ambulatory Visit (INDEPENDENT_AMBULATORY_CARE_PROVIDER_SITE_OTHER): Payer: 59 | Admitting: *Deleted

## 2015-04-12 VITALS — BP 119/71 | HR 85

## 2015-04-12 DIAGNOSIS — F332 Major depressive disorder, recurrent severe without psychotic features: Secondary | ICD-10-CM

## 2015-04-12 DIAGNOSIS — F329 Major depressive disorder, single episode, unspecified: Secondary | ICD-10-CM | POA: Diagnosis not present

## 2015-04-12 NOTE — Progress Notes (Signed)
Patient ID: George Proctor, male   DOB: 05-04-1956, 59 y.o.   MRN: [id number] Pt reported to [facility] for Repetitive Transcranial Magnetic Stimulation treatment for Major Depressive Disorder. Pt reported no change in medication regimen, alcohol/substance use, sleep pattern, or metal implant status since previous tx. Pt reported that he consumed slightly more caffeine than he did the previous day, as he continues to feel extremely fatigued. Pt tolerated tx well. %MT remained at 120% for the duration of tx. Pt with no complaints post-tx. Pt departed from clinic without issue.  I agree with assessment and plan Geralyn Flash A. Sabra Heck, M.D.

## 2015-04-13 ENCOUNTER — Other Ambulatory Visit (INDEPENDENT_AMBULATORY_CARE_PROVIDER_SITE_OTHER): Payer: 59 | Admitting: *Deleted

## 2015-04-13 VITALS — BP 112/70 | HR 73

## 2015-04-13 DIAGNOSIS — F329 Major depressive disorder, single episode, unspecified: Secondary | ICD-10-CM | POA: Diagnosis not present

## 2015-04-13 DIAGNOSIS — F332 Major depressive disorder, recurrent severe without psychotic features: Secondary | ICD-10-CM

## 2015-04-13 NOTE — Progress Notes (Addendum)
Patient ID: George Proctor, male   DOB: [birth year], 59 y.o.   MRN: [id number] Pt reported to [facility] for Repetitive Transcranial Magnetic Stimulation treatment for Major Depressive Disorder. Pt reported no change in medication regimen, alcohol/substance use, caffeine consumption, sleep pattern, or metal implant status since previous tx. Pt reported continued lack of energy and increased caffeine consumption. Pt tolerated tx well. %MT remained at 120% for the duration of tx. Pt with no complaints post-tx. Pt departed from clinic without issue.  I agree with assessment and plan Geralyn Flash A. Sabra Heck, M.D.

## 2015-04-14 ENCOUNTER — Other Ambulatory Visit (INDEPENDENT_AMBULATORY_CARE_PROVIDER_SITE_OTHER): Payer: 59 | Admitting: *Deleted

## 2015-04-14 VITALS — BP 113/76 | HR 90

## 2015-04-14 DIAGNOSIS — F332 Major depressive disorder, recurrent severe without psychotic features: Secondary | ICD-10-CM | POA: Diagnosis not present

## 2015-04-14 DIAGNOSIS — F329 Major depressive disorder, single episode, unspecified: Secondary | ICD-10-CM | POA: Diagnosis not present

## 2015-04-14 NOTE — Progress Notes (Signed)
Patient ID: George Proctor, male   DOB: 26-Aug-1956, 59 y.o.   MRN: [id number] Pt reported to [facility] for Repetitive Transcranial Magnetic Stimulation treatment for Major Depressive Disorder. Pt reported no change in medication regimen, alcohol/substance use, caffeine consumption, sleep pattern, or metal implant status since previous tx. Pt tolerated tx well. %MT remained at 120% for the duration of tx. Pt with no complaints post-tx. Pt departed from clinic without issue.  I agree with assessment and plan Geralyn Flash A. Sabra Heck, M.D.

## 2015-04-17 ENCOUNTER — Other Ambulatory Visit (HOSPITAL_COMMUNITY): Payer: 59 | Attending: Psychiatry | Admitting: *Deleted

## 2015-04-17 VITALS — BP 120/86 | HR 83

## 2015-04-17 DIAGNOSIS — F332 Major depressive disorder, recurrent severe without psychotic features: Secondary | ICD-10-CM | POA: Diagnosis not present

## 2015-04-17 DIAGNOSIS — F329 Major depressive disorder, single episode, unspecified: Secondary | ICD-10-CM | POA: Diagnosis present

## 2015-04-17 NOTE — Progress Notes (Signed)
Patient ID: EL PILE, male   DOB: 02/06/1956, 59 y.o.   MRN: [id number] Pt reported to [facility] for Repetitive Transcranial Magnetic Stimulation treatment for Major Depressive Disorder. Pt reported no change in medication regimen, alcohol/substance use, caffeine consumption, or metal implant status since previous tx. Pt stated that he did not sleep well over the weekend, waking up Saturday and Sunday nights in the middle of the night unable to fall back asleep. Pt reported that he got approximately 4 hours of sleep both nights. Pt endorsed feeling even less energy than normal the past two days as a result. Pt tolerated tx well. %MT remained at 120% for the duration of tx. Pt with no complaints post-tx. Pt departed from clinic without issue.  I agree with assessment and plan Geralyn Flash A. Sabra Heck, M.D.

## 2015-04-18 ENCOUNTER — Other Ambulatory Visit (INDEPENDENT_AMBULATORY_CARE_PROVIDER_SITE_OTHER): Payer: 59 | Admitting: *Deleted

## 2015-04-18 VITALS — BP 112/77 | HR 78

## 2015-04-18 DIAGNOSIS — F329 Major depressive disorder, single episode, unspecified: Secondary | ICD-10-CM | POA: Diagnosis not present

## 2015-04-18 DIAGNOSIS — F332 Major depressive disorder, recurrent severe without psychotic features: Secondary | ICD-10-CM

## 2015-04-18 NOTE — Progress Notes (Signed)
Patient ID: George Proctor, male   DOB: 03/20/1956, 59 y.o.   MRN: [id number] Pt reported to [facility] for Repetitive Transcranial Magnetic Stimulation treatment for Major Depressive Disorder. Pt reported no change in medication regimen, alcohol/substance use, caffeine consumption, sleep pattern, or metal implant status since previous tx. Pt was slightly more talkative today, with conversation initiated by staff, but his affect continued to be flat as usual. Pt tolerated tx well. %MT remained at 120% for the duration of tx. Pt with no complaints post-tx. Pt departed from clinic without issue.  I agree with assessment and plan Geralyn Flash A. Sabra Heck, M.D.

## 2015-04-19 ENCOUNTER — Other Ambulatory Visit (INDEPENDENT_AMBULATORY_CARE_PROVIDER_SITE_OTHER): Payer: 59 | Admitting: *Deleted

## 2015-04-19 VITALS — BP 115/74 | HR 82 | Ht 73.0 in | Wt 194.8 lb

## 2015-04-19 DIAGNOSIS — F332 Major depressive disorder, recurrent severe without psychotic features: Secondary | ICD-10-CM | POA: Diagnosis not present

## 2015-04-19 DIAGNOSIS — F329 Major depressive disorder, single episode, unspecified: Secondary | ICD-10-CM | POA: Diagnosis not present

## 2015-04-19 NOTE — Progress Notes (Signed)
Patient ID: George Proctor, male   DOB: [birth year], 59 y.o.   MRN: [id number] Pt reported to [facility] for Repetitive Transcranial Magnetic Stimulation treatment for Major Depressive Disorder. Pt reported no change in medication regimen, alcohol/substance use, caffeine consumption, sleep pattern, or metal implant status since previous tx. Pt presented with flat, lethargic affect, and stated that he feels even more drained than normal today. Pt stated that he has made an appointment for 05/03/15 with an endocrinologist to address his persistent fatigue. Pt completed a PHQ-9 with a score of 17 (moderately severe depression), which is no change from the previous score taken on 04/11/15. Pt tolerated tx well. %MT remained at 120% for the duration of tx. Pt with no complaints post-tx. Pt departed from clinic without issue.

## 2015-04-20 ENCOUNTER — Other Ambulatory Visit (INDEPENDENT_AMBULATORY_CARE_PROVIDER_SITE_OTHER): Payer: 59 | Admitting: *Deleted

## 2015-04-20 VITALS — BP 123/73 | HR 75

## 2015-04-20 DIAGNOSIS — F332 Major depressive disorder, recurrent severe without psychotic features: Secondary | ICD-10-CM | POA: Diagnosis not present

## 2015-04-20 DIAGNOSIS — F329 Major depressive disorder, single episode, unspecified: Secondary | ICD-10-CM | POA: Diagnosis not present

## 2015-04-20 NOTE — Progress Notes (Signed)
Patient ID: George Proctor, male   DOB: 1955/11/07, 59 y.o.   MRN: [id number] Pt reported to [facility] for Repetitive Transcranial Magnetic Stimulation treatment for Major Depressive Disorder. Pt presented with flat affect today, but was using light humor in his interactions with this writer by the end of the treatment session. Pt reported no change in medication regimen, alcohol/substance use, caffeine consumption, sleep pattern, or metal implant status since previous tx. Pt completed a Beck's Depression Inventory with a score of 22 (moderate depression), which is a 33% reduction from his baseline score on this scale taken on 03/09/2015. Pt tolerated tx well. %MT remained at 120% for the duration of tx. Pt with no complaints post-tx. Pt departed from clinic without issue. Writer will discuss necessity for taper sessions with MD, and if indicated, will request auth from pt's insurance.  I agree with assessment and plan Geralyn Flash A. Sabra Heck, M.D.

## 2015-04-27 ENCOUNTER — Other Ambulatory Visit (INDEPENDENT_AMBULATORY_CARE_PROVIDER_SITE_OTHER): Payer: 59 | Admitting: *Deleted

## 2015-04-27 VITALS — BP 115/78 | HR 75

## 2015-04-27 DIAGNOSIS — F332 Major depressive disorder, recurrent severe without psychotic features: Secondary | ICD-10-CM

## 2015-04-27 DIAGNOSIS — F329 Major depressive disorder, single episode, unspecified: Secondary | ICD-10-CM | POA: Diagnosis not present

## 2015-04-27 NOTE — Progress Notes (Signed)
Patient ID: George Proctor, male   DOB: 1956/05/31, 59 y.o.   MRN: [id number] Pt reported to [facility] for Repetitive Transcranial Magnetic Stimulation treatment for Major Depressive Disorder. Pt reported no change in medication regimen, alcohol/substance use, caffeine consumption, sleep pattern, or metal implant status since previous tx. Pt reported that he is currently feeling even more tired and fatigued than he has been the past several weeks, despite not receiving TMS tx since 04/20/15. Pt has experienced incrementally worsening fatigue since beginning [place] treatment. Pt completed a PHQ-9 with a score of 17 (moderately severe depression), which is no change from his previous score take on 04/19/15. Pt tolerated tx well. %MT remained at 120% for the duration of tx. Pt with no complaints post-tx. Pt departed from clinic without issue.  I agree with assessment and plan Geralyn Flash A. Sabra Heck, M.D.

## 2015-04-28 ENCOUNTER — Other Ambulatory Visit (INDEPENDENT_AMBULATORY_CARE_PROVIDER_SITE_OTHER): Payer: 59 | Admitting: *Deleted

## 2015-04-28 VITALS — BP 121/77 | HR 83

## 2015-04-28 DIAGNOSIS — F332 Major depressive disorder, recurrent severe without psychotic features: Secondary | ICD-10-CM

## 2015-04-28 DIAGNOSIS — F329 Major depressive disorder, single episode, unspecified: Secondary | ICD-10-CM | POA: Diagnosis not present

## 2015-04-28 NOTE — Progress Notes (Signed)
Patient ID: George Proctor, male   DOB: Aug 04, 1956, 59 y.o.   MRN: [id number] Pt reported to [facility] for Repetitive Transcranial Magnetic Stimulation treatment for Major Depressive Disorder. This is a taper session in the pt's course of [place] tx. Pt reported no change in medication regimen, alcohol/substance use, caffeine consumption, sleep pattern, or metal implant status since previous tx. Pt presented today with flat affect, but was more talkative than normal, maintaining conversation with staff throughout tx session. Pt tolerated tx well. %MT remained at 120% for the duration of tx. Pt with no complaints post-tx. Pt departed from clinic without issue.  I agree with assessment and plan Geralyn Flash A. Sabra Heck, M.D.

## 2015-05-01 ENCOUNTER — Other Ambulatory Visit (INDEPENDENT_AMBULATORY_CARE_PROVIDER_SITE_OTHER): Payer: 59 | Admitting: *Deleted

## 2015-05-01 VITALS — BP 114/69 | HR 70

## 2015-05-01 DIAGNOSIS — F329 Major depressive disorder, single episode, unspecified: Secondary | ICD-10-CM | POA: Diagnosis not present

## 2015-05-01 DIAGNOSIS — F332 Major depressive disorder, recurrent severe without psychotic features: Secondary | ICD-10-CM | POA: Diagnosis not present

## 2015-05-01 NOTE — Progress Notes (Signed)
Patient ID: George Proctor, male   DOB: 11-Jan-1956, 59 y.o.   MRN: [id number] Pt reported to [facility] for Repetitive Transcranial Magnetic Stimulation treatment for Major Depressive Disorder. Pt reported no change in medication regimen, alcohol/substance use, caffeine consumption, sleep pattern, or metal implant status since previous tx. Pt reported that he remains extremely tired and fatigued. Pt was talkative again, today, maintaining conversation with staff for the duration of tx session. Pt tolerated tx well. %MT remained at 120% for the duration of tx. Pt with no complaints post-tx. Pt departed from clinic without issue. I agree with assessment and plan Geralyn Flash A. Sabra Heck, M.D.

## 2015-05-03 ENCOUNTER — Other Ambulatory Visit (INDEPENDENT_AMBULATORY_CARE_PROVIDER_SITE_OTHER): Payer: 59 | Admitting: *Deleted

## 2015-05-03 VITALS — BP 116/72 | HR 82

## 2015-05-03 DIAGNOSIS — F329 Major depressive disorder, single episode, unspecified: Secondary | ICD-10-CM | POA: Diagnosis not present

## 2015-05-03 DIAGNOSIS — F332 Major depressive disorder, recurrent severe without psychotic features: Secondary | ICD-10-CM | POA: Diagnosis not present

## 2015-05-03 NOTE — Progress Notes (Signed)
Patient ID: George Proctor, male   DOB: 04/06/1956, 59 y.o.   MRN: [id number] Pt reported to [facility] for Repetitive Transcranial Magnetic Stimulation treatment for Major Depressive Disorder. Pt reported no change in medication regimen, alcohol/substance use, caffeine consumption, sleep pattern, or metal implant status since previous tx. Pt reported that he had an appointment with an endocrinologist today, where he was evaluated and multiple tests were ordered. Pt stated that he has a return visit scheduled for 2 weeks from today. Pt tolerated tx well. %MT remained at 120% for the duration of tx. Pt with no complaints post-tx. Pt departed from clinic without issue.  I agree with assessment and plan Geralyn Flash A. Sabra Heck, M.D.

## 2015-05-05 ENCOUNTER — Other Ambulatory Visit (INDEPENDENT_AMBULATORY_CARE_PROVIDER_SITE_OTHER): Payer: 59 | Admitting: *Deleted

## 2015-05-05 VITALS — BP 118/75 | HR 89

## 2015-05-05 DIAGNOSIS — F332 Major depressive disorder, recurrent severe without psychotic features: Secondary | ICD-10-CM

## 2015-05-05 DIAGNOSIS — F329 Major depressive disorder, single episode, unspecified: Secondary | ICD-10-CM | POA: Diagnosis not present

## 2015-05-05 NOTE — Progress Notes (Signed)
Patient ID: George Proctor, male   DOB: [birth year], 59 y.o.   MRN: [id number] Pt reported to [facility] for Repetitive Transcranial Magnetic Stimulation treatment for Major Depressive Disorder. Pt reported no change in medication regimen, alcohol/substance use, caffeine consumption, or metal implant status since previous tx. Pt reported that he feels that he has been sleeping somewhat better recently, although he continues to feel extremely fatigued throughout the day. Pt completed a PHQ-9 with a score of 14 (moderate depression), which is decreased from his previous score of 17 which he had for several weeks. Pt tolerated tx well. Power remained at 120%MT for the duration of tx. Pt with no complaints post-tx. Pt departed from clinic without issue.  I agree with assessment and plan Geralyn Flash A. Sabra Heck, M.D.

## 2015-05-08 ENCOUNTER — Other Ambulatory Visit (INDEPENDENT_AMBULATORY_CARE_PROVIDER_SITE_OTHER): Payer: 59 | Admitting: *Deleted

## 2015-05-08 VITALS — BP 119/75 | HR 70

## 2015-05-08 DIAGNOSIS — F329 Major depressive disorder, single episode, unspecified: Secondary | ICD-10-CM | POA: Diagnosis not present

## 2015-05-08 DIAGNOSIS — F332 Major depressive disorder, recurrent severe without psychotic features: Secondary | ICD-10-CM | POA: Diagnosis not present

## 2015-05-08 NOTE — Progress Notes (Signed)
Patient ID: George Proctor, male   DOB: 05/04/1956, 59 y.o.   MRN: [id number] Pt reported to [facility] for Repetitive Transcranial Magnetic Stimulation treatment for Major Depressive Disorder. Pt reported no change in alcohol/substance use, caffeine consumption, sleep pattern, or metal implant status since previous tx. Pt reported that he began taking lavender by mouth the previous day for his depression, as prescribed by his psychiatrist. Pt completed a PHQ-9 with a score of 16 (moderately severe depression), which is a slight increase from the previous week's score of 14. Pt also completed a Beck's Depression Inventory with a score of 24 (moderate depression), which is slightly increased from his score of 22 on 04/20/15, but decreased from his baseline score of 33 taken on 03/09/15. Pt tolerated tx well. Power remained at 120% for the duration of tx. Pt with no complaints post-tx. Pt departed from clinic without issue. This was the final tx session in the pt's course of tx. Will schedule a follow up visit with the MD for approximately one month from today. I agree with assessment and plan Geralyn Flash A. Sabra Heck, M.D.

## 2015-06-01 ENCOUNTER — Ambulatory Visit (INDEPENDENT_AMBULATORY_CARE_PROVIDER_SITE_OTHER): Payer: 59 | Admitting: Psychiatry

## 2015-06-01 DIAGNOSIS — F332 Major depressive disorder, recurrent severe without psychotic features: Secondary | ICD-10-CM | POA: Diagnosis not present

## 2015-06-01 NOTE — Progress Notes (Signed)
Patient ID: George Proctor, male   DOB: 1955/12/02, 59 y.o.   MRN: [id number] Mr Masso has completed his course of [place] and says he notices no change at all.  Feels the same as the day he started.  Depression persists.  In talking to him he does have clear ADHD inattentive type symptoms and introverted personality.  The ADHD symptoms bother him the most as he cannot focus, remember, finish things in a timely way, organize and plan ahead, gets distracted easily and procrastinates.  This makes him depressed as he feels inadequate and has to work so hard to do things that others do so easily and that he should be able to equally easily.  His introversion clashes with his wife's apparent extroversion and leads to his judgment of himself as inadequate.  He has been treated for ADHD in the past without much success.   My recommendation is that he pursue another round of ADHD treatment and follow up with his therapist with more specific feedback of what is working or not for him. He already plans to pursue another medical workup to make sure there is no medical explanation for his symptoms.  ECT is the ultimate treatment for depression but I suspect personality issues are the fuel for the depression and ECT might not be effective.  H:e remains depressed but not suicidal, just frustrated.

## 2015-06-06 ENCOUNTER — Ambulatory Visit (HOSPITAL_COMMUNITY): Payer: Self-pay | Admitting: Psychiatry

## 2015-06-19 ENCOUNTER — Ambulatory Visit (INDEPENDENT_AMBULATORY_CARE_PROVIDER_SITE_OTHER): Payer: 59 | Admitting: Neurology

## 2015-06-19 ENCOUNTER — Encounter: Payer: Self-pay | Admitting: Neurology

## 2015-06-19 VITALS — BP 130/83 | HR 72 | Ht 73.0 in | Wt 194.0 lb

## 2015-06-19 DIAGNOSIS — R5382 Chronic fatigue, unspecified: Secondary | ICD-10-CM

## 2015-06-19 DIAGNOSIS — R413 Other amnesia: Secondary | ICD-10-CM | POA: Diagnosis not present

## 2015-06-19 DIAGNOSIS — G4710 Hypersomnia, unspecified: Secondary | ICD-10-CM

## 2015-06-19 NOTE — Progress Notes (Signed)
PATIENT: George Proctor DOB: 1956/02/23  Chief Complaint  Patient presents with  . Memory Loss    MMSE - animals.  He has been noticing a decling memory for the last few years.    Marland Kitchen POOR COORDINATION    He is having difficulty with his hands and feet.  . Excessive daytime somnolence    Feels he sleeps well at night but is excessively tired during the day.     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 Brett Fairy in 2013, he has tried and failed multiple different antidepression seen in the past, Zoloft, Paxil, Celexa, Cymbalta,, Effexor, pamelor, abilify, he is not on any antidepression at this point,  I have reviewed MRI of the brain without contrast 2013 that was normal  REVIEW OF SYSTEMS: Full 14 system review of systems performed and notable only for fatigue, palpitation, ringing ears, shortness of breath, feeling hot, memory loss, confusion, numbness, weakness, slurred speech, dizziness, insomnia, sleepiness, depression anxiety not enough sleep, decreased energy, disinteresting activities, suicidal thoughts. ALLERGIES: No Known Allergies  HOME MEDICATIONS: Current Outpatient Prescriptions  Medication Sig Dispense Refill    . B Complex Vitamins (VITAMIN B COMPLEX PO) Take by mouth.    . Cholecalciferol (VITAMIN D-3 PO) Take by mouth.    . Cyanocobalamin (VITAMIN B-12 CR PO) Take by mouth.    . fish oil-omega-3 fatty acids 1000 MG capsule Take 6 g by mouth daily.     . Multiple Vitamin (MULTIVITAMIN) capsule Take 1 capsule by mouth daily.    . ranitidine (ZANTAC) 150 MG capsule Take 150 mg by mouth 2 (two) times daily.     No current facility-administered medications for this visit.    PAST MEDICAL HISTORY: Past Medical History  Diagnosis Date  . Depression   . Anxiety   . Memory loss   . Gait difficulty     PAST SURGICAL HISTORY: Past Surgical History  Procedure Laterality Date  . Abdominal surgery      Childhood  . Bunionectomy      FAMILY HISTORY: Family History  Problem Relation Age of Onset  . Aneurysm Mother 28    Thoracic  . Hypertension Father     SOCIAL HISTORY:  Social History   Social History  . Marital Status: Married    Spouse Name: N/A  . Number of Children: 0  . Years of Education: [employer]   Occupational History  . 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 Smoking status: Never Smoker   . Smokeless tobacco: Never Used  . Alcohol Use: 0.0 oz/week    0 Standard drinks or equivalent per week  Comment: Less than one drink per week.  . Drug Use: No  . Sexual Activity: Not on file   Other Topics Concern  . Not on file   Social History Narrative   Lives at home with wife.   Right-handed.   3 cups caffeine per day.     PHYSICAL EXAM   Filed Vitals:   06/19/15 1532  BP: 130/83  Pulse: 72  Height: _0  (1.854 m)  Weight: 194 lb (87.998 kg)    Not recorded      Body mass index is 25.6 kg/(m^2).  PHYSICAL EXAMNIATION:  Gen: NAD, conversant, well nourised, obese, well groomed                     Cardiovascular: Regular rate rhythm, no peripheral edema, warm, nontender. Eyes: Conjunctivae clear without exudates or  hemorrhage Neck: Supple, no carotid bruise. Pulmonary: Clear to auscultation bilaterally   NEUROLOGICAL EXAM:  MENTAL STATUS: Speech:    Speech is normal; fluent and spontaneous with normal comprehension.  Cognition: Depressed looking middle-age man, Mini-Mental Status Examination is 55 out of 30      Orientation to time, place and person     Recent and remote memory: He missed one out of 3 recalls     Normal Attention span and concentration     Normal Language, naming, repeating,spontaneous speech     Fund of knowledge   CRANIAL NERVES: CN II: Visual fields are full to confrontation. Fundoscopic exam is normal with sharp discs and no vascular changes. Pupils are round equal and briskly reactive to light. CN III, IV, VI: extraocular movement are normal. No ptosis. CN V: Facial sensation is intact to pinprick in all 3 divisions bilaterally. Corneal responses are intact.  CN VII: Face is symmetric with normal eye closure and smile. CN VIII: Hearing is normal to rubbing fingers CN IX, X: Palate elevates symmetrically. Phonation is normal. CN XI: Head turning and shoulder shrug are intact CN XII: Tongue is midline with normal movements and no atrophy.  MOTOR: There is no pronator drift of out-stretched arms. Muscle bulk and tone are normal. Muscle strength is normal.  REFLEXES: Reflexes are 2+ and symmetric at the biceps, triceps, knees, and ankles. Plantar responses are flexor.  SENSORY: Intact to light touch, pinprick, position sense, and vibration sense are intact in fingers and toes.  COORDINATION: Rapid alternating movements and fine finger movements are intact. There is no dysmetria on finger-to-nose and heel-knee-shin.    GAIT/STANCE: Posture is normal. Gait is steady with normal steps, base, arm swing, and turning. Heel and toe walking are normal. Tandem gait is normal.  Romberg is absent.   DIAGNOSTIC DATA (LABS, IMAGING, TESTING) - I reviewed patient records, labs,  notes, testing and imaging myself where available.   ASSESSMENT AND PLAN  George Proctor is a 59 y.o. male with constellation of complaints, includes memory loss, confusion, difficulty focusing, lack of motivation, extensive evaluations detailed above, was essentially normal,  Most suggestive of depression anxiety Complete evaluation with EEG     Marcial Pacas, M.D. Ph.D.  [facility] [address] [place], [place] [address] Ph: [phone] Fax: [phone]  NL:[id number] Shelia Media, MD

## 2015-07-06 ENCOUNTER — Telehealth: Payer: Self-pay | Admitting: Neurology

## 2015-07-06 NOTE — Telephone Encounter (Signed)
Reviewed his appt and referral with him.  He will proceed with the EEG, as ordered.

## 2015-07-06 NOTE — Telephone Encounter (Signed)
Pt called stating he is wanting to know the reason on the referral from Dr Wilson Singer what he was to seen for at [facility]. He states the EEG is not what Dr Wilson Singer originally thought. Unforntunately DrKohut's office does not have cc of referral and Dr Wilson Singer does remember either. Please call and advise at [phone]

## 2015-07-11 ENCOUNTER — Ambulatory Visit (INDEPENDENT_AMBULATORY_CARE_PROVIDER_SITE_OTHER): Payer: 59 | Admitting: Neurology

## 2015-07-11 DIAGNOSIS — R413 Other amnesia: Secondary | ICD-10-CM

## 2015-07-11 DIAGNOSIS — R4182 Altered mental status, unspecified: Secondary | ICD-10-CM

## 2015-07-11 DIAGNOSIS — G4710 Hypersomnia, unspecified: Secondary | ICD-10-CM

## 2015-07-11 DIAGNOSIS — R5382 Chronic fatigue, unspecified: Secondary | ICD-10-CM

## 2015-07-13 NOTE — Procedures (Signed)
   HISTORY:  59 year old male, presented with history of memory loss, confusion, faculty focusing, lack of motivation.  TECHNIQUE:  16 channel EEG was performed based on standard 10-16 international system. One channel was dedicated to EKG, which has demonstrates normal sinus rhythm of  72 beats per minutes.  Upon awakening, the posterior background activity was well-developed, in alpha range, 9 Hz ,with amplitude of  20 microvoltage, reactive to eye opening and closure.    There was no evidence of epileptiform discharge.  Photic stimulation was performed, which induced a symmetric photic driving.  Hyperventilation was performed,  There was occasionally T3 sharp transient during hyperventilation speed.   stage II sleep was achieved as evident by K complex, sleep spindles.  CONCLUSION: This is a  normal awake and asleep EEG.   The isolated appearance of  T3 sharp transient has unknown clinical significance.

## 2016-09-11 ENCOUNTER — Ambulatory Visit (INDEPENDENT_AMBULATORY_CARE_PROVIDER_SITE_OTHER): Payer: 59 | Admitting: Podiatry

## 2016-09-11 ENCOUNTER — Encounter: Payer: Self-pay | Admitting: Podiatry

## 2016-09-11 VITALS — BP 127/81 | HR 84 | Ht 73.0 in | Wt 194.0 lb

## 2016-09-11 DIAGNOSIS — M659 Synovitis and tenosynovitis, unspecified: Secondary | ICD-10-CM

## 2016-09-11 DIAGNOSIS — R600 Localized edema: Secondary | ICD-10-CM | POA: Diagnosis not present

## 2016-09-11 DIAGNOSIS — S99922A Unspecified injury of left foot, initial encounter: Secondary | ICD-10-CM

## 2016-09-11 NOTE — Patient Instructions (Signed)
Seen for pain in left foot and leg following injury. Normal wound healing noted with normal motion and muscle strength. Noted of dry cracking heel callus. Debrided and treatment reviewed. Return as needed.

## 2016-09-11 NOTE — Progress Notes (Signed)
Subjective: 60 year old male presents complaining of pain on left foot and leg following an injury occurred 3 weeks ago on 08/25/16. Swelling is down but pain is lingering on. Tenderness at lateral ankle below lateral malleoli area. Able to bear weight with occasional pain if make a quick turn to a side.  HPI: Treated with Orthotics for right heel pain and done well. Still wearing orthotics.  Objective: Mild edema and increased warmth on left ankle.  History of bunionectomy right. Hypermobile first ray bilateral. STJ hyperpronation bilateral. Normal muscle strength. Normal joint motion without pain left foot.   Assessment: Post traumatic tenosynovitis left ankle, possible anterior retinaculum. Fissured heel right.  Plan: Reviewed findings and available options. Continue with orthotics and lace up shoes. Dry cracked heel debrided and home care instruction given.

## 2016-10-11 DIAGNOSIS — R51 Headache: Secondary | ICD-10-CM | POA: Diagnosis not present

## 2016-10-11 DIAGNOSIS — R5383 Other fatigue: Secondary | ICD-10-CM | POA: Diagnosis not present

## 2016-11-15 DIAGNOSIS — K219 Gastro-esophageal reflux disease without esophagitis: Secondary | ICD-10-CM | POA: Diagnosis not present

## 2016-11-15 DIAGNOSIS — R51 Headache: Secondary | ICD-10-CM | POA: Diagnosis not present

## 2016-11-15 DIAGNOSIS — R5383 Other fatigue: Secondary | ICD-10-CM | POA: Diagnosis not present

## 2016-11-20 ENCOUNTER — Encounter (HOSPITAL_COMMUNITY): Payer: Self-pay | Admitting: Psychiatry

## 2016-11-20 ENCOUNTER — Encounter (INDEPENDENT_AMBULATORY_CARE_PROVIDER_SITE_OTHER): Payer: Self-pay

## 2016-11-20 ENCOUNTER — Ambulatory Visit (INDEPENDENT_AMBULATORY_CARE_PROVIDER_SITE_OTHER): Payer: 59 | Admitting: Psychiatry

## 2016-11-20 VITALS — BP 128/78 | HR 86 | Ht 73.0 in | Wt 189.8 lb

## 2016-11-20 DIAGNOSIS — F329 Major depressive disorder, single episode, unspecified: Secondary | ICD-10-CM | POA: Diagnosis not present

## 2016-11-20 DIAGNOSIS — Z79899 Other long term (current) drug therapy: Secondary | ICD-10-CM

## 2016-11-20 MED ORDER — BREXPIPRAZOLE 1 MG PO TABS: 1.0000 mg | ORAL_TABLET | Freq: Every morning | ORAL | 5 refills | Status: DC

## 2016-11-20 NOTE — Progress Notes (Signed)
Psychiatric Initial Adult Assessment   Patient Identification: George Proctor MRN:  [id number] Date of Evaluation:  11/20/2016 Referral Source: Dr. Gershon Mussel Heeding Chief Complaint:   Visit Diagnosis: No diagnosis found.  History of Present Illness:  This patient is a 61 year old white married male his father at this time is experiencing persistent daily depression. He's here for an evaluation of depression. Is counseled is directly related to work. Apparently many months ago he choked with a supervisor calling him an odd name. Apparently his supervisor started calling him strange names and continued despite the fact that bothered the patient a great deal. But apparently happened was that a number of people at work started to "pick on him". They would call them this unusual name and would bother the patient a great deal. Patient can tell that the supervisor was doing it in an aggressive angry way. It is continued persistent way and this may the patient exceedingly upset. It's made him so upset he is considered some homicidal thinking and impulses towards his supervisor. Otherwise work these days is actually overwhelming. His distress towards a supervisor so concerning that his therapist is taken him out from work. The patient admits to daily persistent depression. Is been present for the last 2-3 months. Proximally 2 months ago the patient went to see Dr. heading in therapy. The patient says his sleep is erratic but it's always been erratic. He always wakes up at 4 clock. At that time he takes care of his wife's chickens. The patient's energy level is extremely low. His concentration is very much afflicted. He feels in a fall almost in a dream. He cannot think or concentrate or make decisions. Fortunately he denies being suicidal. He actually has been suicidal in the past and in 1981 made an attempt by an overdose. At this time his suicidal thinking does occur on a regular basis but it's fleeting. Is never  persistent. He does not well on the thoughts of suicide. Is true intent is not to act suicidal. He is no plan back suicidal and the patient importantly would never do it because of his family. The patient enjoys the television a small amount mouth. Seems to have a reduction in his ability to enjoy things. The patient denies the use of alcohol or drugs. The patient denies any psychotic symptoms. The patient does admit to daily persistent depression in 2010 which was associated with sleep concentration and suicidal ideation. The patient denies ever having manic symptoms. He denies symptoms of generalized anxiety disorder panic disorder or obsessive-compulsive disorder. His medical illnesses her only gastroesophageal reflux disease. His past psychiatric history is significant for 2 psychiatric hospitalizations. One was in 1981 when he took an overdose one was in 2010. At that time he was under the care of Dr. Sabra Heck and in the hospital was begun on Adderall, Cymbalta and Abilify. He also took a small amount of Klonopin. The patient has recently received [place] at our facility with no excess. Patient is never received ECT. The patient has tried and failed on Cymbalta, Zoloft, Wellbutrin, Remeron, Nardil. He's been on Abilify before. At this time the patient is presently in psychotherapy for the last 2 months and believes helpful. Besides seeing Dr. Sabra Heck he also is last saw Dr. Danella Penton. This was 2 years ago he doesn't remember why it ended. At this time the patient is not suicidal. He is fleeting homicidal impulses but no true intent or plan to take action with. He is uncomfortable enough though  that he does not want to be near his supervisor. He is worried about acting impulsive. At this time he continue on his work leave as planned.  Associated Signs/Symptoms: Depression Symptoms:  depressed mood, (Hypo) Manic Symptoms:   Anxiety Symptoms:   Psychotic Symptoms:   PTSD Symptoms:   Past Psychiatric History:  Seen psychiatrists 2 times been psychiatrically hospitalized 2 times in his made a suicide attempt in the past. Previous Psychotropic Medications: Yes   Substance Abuse History in the last 12 months:  No.  Consequences of Substance Abuse: NA  Past Medical History:  Past Medical History:  Diagnosis Date  . Anxiety   . Depression   . Gait difficulty   . Memory loss     Past Surgical History:  Procedure Laterality Date  . ABDOMINAL SURGERY     Childhood  . BUNIONECTOMY      Family Psychiatric History:   Family History:  Family History  Problem Relation Age of Onset  . Aneurysm Mother 20    Thoracic  . Hypertension Father     Social History:   Social History   Social History  . Marital status: Married    Spouse name: N/A  . Number of children: 0  . Years of education: [employer]   Occupational History  . 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 Smoking status: Never Smoker  . Smokeless tobacco: Never Used  . Alcohol use 0.0 oz/week     Comment: Less than one drink per week.  . Drug use: No  . Sexual activity: Not Asked   Other Topics Concern  . None   Social History Narrative   Lives at home with wife.   Right-handed.   3 cups caffeine per day.    Additional Social History:   Allergies:  No Known Allergies  Metabolic Disorder Labs: No results found for: HGBA1C, MPG Lab Results  Component Value Date   PROLACTIN  11/28/2007    10.1 (NOTE)     Reference Ranges:                 Male:                       2.1 -  17.1 ng/ml                 Male:   Pregnant          9.7 - 208.5 ng/mL                           Non Pregnant      2.8 -  29.2 ng/mL                           Post  Menopausal   1.8 -  20.3 ng/mL                     Lab Results  Component Value Date   CHOL 197 11/03/2007   TRIG 119 11/03/2007   HDL 37.8 (L) 11/03/2007   CHOLHDL 5.2 CALC 11/03/2007   VLDL 24 11/03/2007   LDLCALC 135 (H) 11/03/2007      Current Medications: Current Outpatient Prescriptions  Medication Sig Dispense Refill  . B Complex Vitamins (VITAMIN B COMPLEX PO) Take by mouth.    . fish oil-omega-3 fatty acids 1000 MG capsule Take  6 g by mouth daily.     . Multiple Vitamin (MULTIVITAMIN) capsule Take 1 capsule by mouth daily.    . ranitidine (ZANTAC) 150 MG capsule Take 150 mg by mouth 2 (two) times daily.    . Cholecalciferol (VITAMIN D-3 PO) Take by mouth.    . Cyanocobalamin (VITAMIN B-12 CR PO) Take by mouth.     No current facility-administered medications for this visit.     Neurologic: Headache: No Seizure: No Paresthesias:No  Musculoskeletal: Strength & Muscle Tone: within normal limits Gait & Station: normal Patient leans: N/A  Psychiatric Specialty Exam: ROS  Blood pressure 128/78, pulse 86, height 6\' 1"  (1.854 m), weight 189 lb 12.8 oz (86.1 kg).Body mass index is 25.04 kg/m.  General Appearance: Casual  Eye Contact:  Good  Speech:  Clear and Coherent  Volume:  Normal  Mood:  Depressed  Affect:  Appropriate  Thought Process:Normal   Orientation:  Full (Time, Place, and Person)  Thought Content:  Logical  Suicidal Thoughts:  No  Homicidal Thoughts:  No  Memory:  NA  Judgement:  Good  Insight:  Good  Psychomotor Activity:  Concentration:    Recall:  Good  Fund of Knowledge:Good  Language: Good  Akathisia:  No  Handed: Right   AIMS (if indicated):    Assets:  Desire for Improvement  ADL's:  Intact  Cognition: WNL  Sleep:      Treatment Plan Summary: At this time this patient will begin on Rexulti 1 mg and Pamelor 25 mg. Patient will continue under the care of Dr. heading. The patient was told to call us at any time that he felt more suicidal or homicidal and we would certainly facilitate his hospitalization. At this time I think is a good idea to these out of work and apparently is been taken out of work for at least the next 5 weeks at his scheduled appointment.. At this  time I think the patient is functioning fairly well. We will also make an effort to study his genetic testing that he brought with him. At this time I do not think this patient is dangerous to himself or to others.   Haskel Schroeder, MD 3/7/20185:46 PM

## 2016-11-21 ENCOUNTER — Other Ambulatory Visit (HOSPITAL_COMMUNITY): Payer: Self-pay

## 2016-11-21 MED ORDER — BREXPIPRAZOLE 1 MG PO TABS: 1.0000 mg | ORAL_TABLET | Freq: Every morning | ORAL | 1 refills | Status: DC

## 2016-11-21 MED ORDER — NORTRIPTYLINE HCL 25 MG PO CAPS: 25.0000 mg | ORAL_CAPSULE | Freq: Every day | ORAL | 1 refills | Status: DC

## 2016-11-21 MED ORDER — BREXPIPRAZOLE 1 MG PO TABS
1.0000 mg | ORAL_TABLET | Freq: Every morning | ORAL | 5 refills | Status: DC
Start: 2016-11-21 — End: 2016-11-21

## 2016-11-21 NOTE — Progress Notes (Signed)
Patient called, the medication was sent to the wrong pharmacy and the Nortriptyline was not sent at all. I sent both medications into the correct pharmacy.

## 2016-11-28 DIAGNOSIS — J309 Allergic rhinitis, unspecified: Secondary | ICD-10-CM | POA: Diagnosis not present

## 2016-12-18 ENCOUNTER — Ambulatory Visit (INDEPENDENT_AMBULATORY_CARE_PROVIDER_SITE_OTHER): Payer: 59 | Admitting: Psychiatry

## 2016-12-18 VITALS — BP 126/74 | HR 107 | Ht 73.0 in | Wt 188.0 lb

## 2016-12-18 DIAGNOSIS — F333 Major depressive disorder, recurrent, severe with psychotic symptoms: Secondary | ICD-10-CM | POA: Diagnosis not present

## 2016-12-18 DIAGNOSIS — Z79899 Other long term (current) drug therapy: Secondary | ICD-10-CM

## 2016-12-18 DIAGNOSIS — Z8659 Personal history of other mental and behavioral disorders: Secondary | ICD-10-CM

## 2016-12-18 MED ORDER — BREXPIPRAZOLE 1 MG PO TABS: 2.0000 mg | ORAL_TABLET | Freq: Every morning | ORAL | 3 refills | Status: DC

## 2016-12-18 MED ORDER — NORTRIPTYLINE HCL 25 MG PO CAPS: 25.0000 mg | ORAL_CAPSULE | Freq: Every day | ORAL | 5 refills | Status: DC

## 2016-12-18 NOTE — Progress Notes (Signed)
Psychiatric Initial Adult Assessment   Patient Identification: George Proctor MRN:  [id number] Date of Evaluation:  12/18/2016 Referral Source: Dr. Gershon Mussel Heeding Chief Complaint:   Visit Diagnosis:    ICD-9-CM ICD-10-CM   1. Hx of major depression V11.8 Z86.59 Nortriptyline level    History of Present Illness  At this time the patient showed no significant improvement. His biggest complaint is a lack of energy. He had a dry mouth or wall way. He still has persistent depression and importantly is more likely in the morning typical of melancholic depression. He has interrupted sleep goes back to sleep. He feels sleepy. He doesn't take naps. His appetite is stable. His energy level is distinctly low. Nonetheless the patient is involved in moving from one house to another continue doing it. I think his complaint also is that he cannot concentrate. He describes himself as having a low self-esteem and low self-confidence. He will not say his worthless. He still enjoys some things like watching TV. He doesn't read or listen to music. He denies being acutely suicidal. He clearly has conflicts with his wife. Is not clear if he loves her but clearly is not communicative negotiate. Recently she adopted a puppy. The patient is stressed a great deal about moving as well. Is very clear that not only did have a relationship with a supervisor also work was increasingly demanding. The patient continues in psychotherapy. Is not suicidal.  : Depression Symptoms:  depressed mood, (Hypo) Manic Symptoms:   Anxiety Symptoms:   Psychotic Symptoms:   PTSD Symptoms:   Past Psychiatric History: Seen psychiatrists 2 times been psychiatrically hospitalized 2 times in his made a suicide attempt in the past. Previous Psychotropic Medications: Yes   Substance Abuse History in the last 12 months:  No.  Consequences of Substance Abuse: NA  Past Medical History:  Past Medical History:  Diagnosis Date  . Anxiety   .  Depression   . Gait difficulty   . Memory loss     Past Surgical History:  Procedure Laterality Date  . ABDOMINAL SURGERY     Childhood  . BUNIONECTOMY      Family Psychiatric History:   Family History:  Family History  Problem Relation Age of Onset  . Aneurysm Mother 59    Thoracic  . Hypertension Father     Social History:   Social History   Social History  . Marital status: Married    Spouse name: N/A  . Number of children: 0  . Years of education: [employer]   Occupational History  . 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 Smoking status: Never Smoker  . Smokeless tobacco: Never Used  . Alcohol use 0.0 oz/week     Comment: Less than one drink per week.  . Drug use: No  . Sexual activity: Not on file   Other Topics Concern  . Not on file   Social History Narrative   Lives at home with wife.   Right-handed.   3 cups caffeine per day.    Additional Social History:   Allergies:  No Known Allergies  Metabolic Disorder Labs: No results found for: HGBA1C, MPG Lab Results  Component Value Date   PROLACTIN  11/28/2007    10.1 (NOTE)     Reference Ranges:                 Male:  2.1 -  17.1 ng/ml                 Male:   Pregnant          9.7 - 208.5 ng/mL                           Non Pregnant      2.8 -  29.2 ng/mL                           Post  Menopausal   1.8 -  20.3 ng/mL                     Lab Results  Component Value Date   CHOL 197 11/03/2007   TRIG 119 11/03/2007   HDL 37.8 (L) 11/03/2007   CHOLHDL 5.2 CALC 11/03/2007   VLDL 24 11/03/2007   LDLCALC 135 (H) 11/03/2007     Current Medications: Current Outpatient Prescriptions  Medication Sig Dispense Refill  . B Complex Vitamins (VITAMIN B COMPLEX PO) Take by mouth.    . Brexpiprazole (REXULTI) 1 MG TABS Take 2 tablets (2 mg total) by mouth every morning. 1  qam 30 tablet 3  . Cholecalciferol (VITAMIN D-3 PO) Take by mouth.    .  Cyanocobalamin (VITAMIN B-12 CR PO) Take by mouth.    . fish oil-omega-3 fatty acids 1000 MG capsule Take 6 g by mouth daily.     . Multiple Vitamin (MULTIVITAMIN) capsule Take 1 capsule by mouth daily.    . nortriptyline (PAMELOR) 25 MG capsule Take 1 capsule (25 mg total) by mouth at bedtime. 30 capsule 5  . ranitidine (ZANTAC) 150 MG capsule Take 150 mg by mouth 2 (two) times daily.     No current facility-administered medications for this visit.     Neurologic: Headache: No Seizure: No Paresthesias:No  Musculoskeletal: Strength & Muscle Tone: within normal limits Gait & Station: normal Patient leans: N/A  Psychiatric Specialty Exam: ROS  Blood pressure 126/74, pulse (!) 107, height 6\' 1"  (1.854 m), weight 188 lb (85.3 kg).Body mass index is 24.8 kg/m.  General Appearance: Casual  Eye Contact:  Good  Speech:  Clear and Coherent  Volume:  Normal  Mood:  Depressed  Affect:  Appropriate  Thought Process:Normal   Orientation:  Full (Time, Place, and Person)  Thought Content:  Logical  Suicidal Thoughts:  No  Homicidal Thoughts:  No  Memory:  NA  Judgement:  Good  Insight:  Good  Psychomotor Activity:  Concentration:    Recall:  Good  Fund of Knowledge:Good  Language: Good  Akathisia:  No  Handed: Right   AIMS (if indicated):    Assets:  Desire for Improvement  ADL's:  Intact  Cognition: WNL  Sleep:      Treatment Plan Summary: 4/4/20184:20 PM   At this time we will go ahead and get a nortriptyline blood level. We'll also increase his Rexulti to dose of 2 mg. The patient will continue to see me in 5 or 6 weeks. When his next visit we may consider getting a B12 and TSH.

## 2016-12-23 ENCOUNTER — Other Ambulatory Visit (HOSPITAL_COMMUNITY): Payer: Self-pay

## 2016-12-23 MED ORDER — BREXPIPRAZOLE 1 MG PO TABS: 2.0000 mg | ORAL_TABLET | Freq: Every morning | ORAL | 3 refills | Status: DC

## 2017-01-01 ENCOUNTER — Telehealth (HOSPITAL_COMMUNITY): Payer: Self-pay

## 2017-01-01 NOTE — Telephone Encounter (Signed)
Patient is calling because he wants you to know that he could not tolerate taking the Rexulti at 2 mg and he has gone back to 1 mg. He said that the 2 mg had him weak and tired and unable to focus. He also said that his PCP is recommending that he be put on a stimulant such as Adderall to get his energy up. He just wanted you to be aware.

## 2017-01-03 ENCOUNTER — Ambulatory Visit (HOSPITAL_COMMUNITY): Payer: Self-pay | Admitting: Psychiatry

## 2017-01-07 ENCOUNTER — Ambulatory Visit (HOSPITAL_COMMUNITY): Payer: Self-pay | Admitting: Psychiatry

## 2017-01-07 DIAGNOSIS — J329 Chronic sinusitis, unspecified: Secondary | ICD-10-CM | POA: Diagnosis not present

## 2017-01-11 LAB — NORTRIPTYLINE LEVEL: Nortriptyline Lvl: 13 mcg/L — ABNORMAL LOW (ref 50–150)

## 2017-01-13 ENCOUNTER — Telehealth (HOSPITAL_COMMUNITY): Payer: Self-pay

## 2017-01-13 NOTE — Telephone Encounter (Signed)
Patient called for his results on labs. His Nortriptyline level was low at 13. Please review and advise, thank you

## 2017-01-15 MED ORDER — NORTRIPTYLINE HCL 25 MG PO CAPS: 50.0000 mg | ORAL_CAPSULE | Freq: Every day | ORAL | 0 refills | Status: DC

## 2017-01-15 NOTE — Telephone Encounter (Signed)
[name] I sent a new prescription to the pharmacy for 2 tabs a day of the Nortriptyline 25 mg. I called the patient and advised him of this and reminded him to keep his appointment in June. Patient was agreeable to this plan.

## 2017-01-16 ENCOUNTER — Ambulatory Visit (INDEPENDENT_AMBULATORY_CARE_PROVIDER_SITE_OTHER): Payer: 59 | Admitting: Internal Medicine

## 2017-01-16 ENCOUNTER — Encounter: Payer: Self-pay | Admitting: Internal Medicine

## 2017-01-16 VITALS — BP 119/78 | HR 87 | Ht 73.0 in | Wt 187.4 lb

## 2017-01-16 DIAGNOSIS — F3289 Other specified depressive episodes: Secondary | ICD-10-CM

## 2017-01-16 DIAGNOSIS — R0602 Shortness of breath: Secondary | ICD-10-CM

## 2017-01-16 NOTE — Patient Instructions (Signed)
Medication Instructions:  Your physician recommends that you continue on your current medications as directed. Please refer to the Current Medication list given to you today.  If you need a refill on your cardiac medications before your next appointment, please call your pharmacy.  Testing/Procedures: Your physician has requested that you have an echocardiogram. Echocardiography is a painless test that uses sound waves to create images of your heart. It provides your doctor with information about the size and shape of your heart and how well your heart's chambers and valves are working. This procedure takes approximately one hour. There are no restrictions for this procedure.  Follow-Up: Your physician wants you to follow-up in: AFTER PROCEDURE WITH DR HILTY   Thank you for choosing [facility] at [facility]!!

## 2017-01-17 DIAGNOSIS — F32A Depression, unspecified: Secondary | ICD-10-CM | POA: Insufficient documentation

## 2017-01-17 DIAGNOSIS — F329 Major depressive disorder, single episode, unspecified: Secondary | ICD-10-CM | POA: Insufficient documentation

## 2017-01-17 NOTE — Progress Notes (Signed)
OFFICE CONSULT NOTE  Chief Complaint:  Shortness of breath, feeling faint, fatigue, mental fogginess  Primary Care Physician: Horatio Pel, MD  HPI:  George Proctor is a 61 y.o. male who is being seen today for the evaluation of the above chief complaints at the request of [name], MD. George Proctor has a history of anxiety and depression as well as memory loss in the past. Recently he's been suffering from shortness of breath, fatigue, mental fogginess, anhedonia and symptoms concerning for depression. He denies any anginal symptoms, per se. His shortness of breath sometimes is associated with exertion and can be present at rest. He says that he's had a mental fogginess and has definitely struggled with depression symptoms. He is currently on nortriptyline for his depression. Otherwise he takes no medications. Is no history of hypertension, diabetes or significant dyslipidemia. Family history is significant for hypertension and heart disease in his father but he still living at age 75.   PMHx:  Past Medical History:  Diagnosis Date  . Anxiety   . Depression   . Gait difficulty   . Memory loss     Past Surgical History:  Procedure Laterality Date  . ABDOMINAL SURGERY     Childhood  . BUNIONECTOMY      FAMHx:  Family History  Problem Relation Age of Onset  . Aneurysm Mother 55    Thoracic  . Hypertension Father   . Heart attack Paternal Grandfather     SOCHx:   reports that he has never smoked. He has never used smokeless tobacco. He reports that he drinks alcohol. He reports that he does not use drugs.  ALLERGIES:  No Known Allergies  ROS: Pertinent items noted in HPI and remainder of comprehensive ROS otherwise negative.  HOME MEDS: Current Outpatient Prescriptions on File Prior to Visit  Medication Sig Dispense Refill  . B Complex Vitamins (VITAMIN B COMPLEX PO) Take by mouth.    . fish oil-omega-3 fatty acids 1000 MG capsule Take 6 g by mouth daily.      . Multiple Vitamin (MULTIVITAMIN) capsule Take 1 capsule by mouth daily.    . nortriptyline (PAMELOR) 25 MG capsule Take 2 capsules (50 mg total) by mouth at bedtime. 60 capsule 0  . ranitidine (ZANTAC) 150 MG capsule Take 150 mg by mouth 2 (two) times daily.     No current facility-administered medications on file prior to visit.     LABS/IMAGING: No results found for this or any previous visit (from the past 48 hour(s)). No results found.  LIPID PANEL:    Component Value Date/Time   CHOL 197 11/03/2007 1136   TRIG 119 11/03/2007 1136   HDL 37.8 (L) 11/03/2007 1136   CHOLHDL 5.2 CALC 11/03/2007 1136   VLDL 24 11/03/2007 1136   LDLCALC 135 (H) 11/03/2007 1136    WEIGHTS: Wt Readings from Last 3 Encounters:  01/16/17 187 lb 6.4 oz (85 kg)  09/11/16 194 lb (88 kg)  06/19/15 194 lb (88 kg)    VITALS: BP 119/78 (BP Location: Left Arm, Patient Position: Sitting)   Pulse 87   Ht 6\' 1"  (1.854 m)   Wt 187 lb 6.4 oz (85 kg)   BMI 24.72 kg/m   EXAM: General appearance: alert and no distress Neck: no carotid bruit, no JVD and thyroid not enlarged, symmetric, no tenderness/mass/nodules Lungs: clear to auscultation bilaterally Heart: regular rate and rhythm Abdomen: soft, non-tender; bowel sounds normal; no masses,  no organomegaly Extremities: extremities normal,  atraumatic, no cyanosis or edema Pulses: 2+ and symmetric Skin: Skin color, texture, turgor normal. No rashes or lesions Neurologic: Grossly normal Psych: Pleasant  EKG: Normal sinus rhythm at 83 - personally reviewed  ASSESSMENT: 1. Shortness of breath, fatigue 2. Probable depression  PLAN: 1.   Mr. Maffett is describing more recent onset shortness of breath and fatigue. He denies any anginal symptoms. Some of his shortness of breath is exertion and some at rest. It does not seem to be consistent. He is more concerned about fatigue and other symptoms such as apathy, anhedonia, and social withdrawal which he  agreed is present, which seems to be more consistent with depression. I would like to rule out any structural cardiac disease as a cause of his shortness of breath. Plan follow-up after his echocardiogram. If this is somewhat unrevealing then I would recommend more aggressive treatment for his depression.  Thanks for the consultation.  Pixie Casino, MD, [place]  Attending Cardiologist  Direct Dial: [phone]  Fax: [phone]  Website:  [email or web address] 01/17/2017, 9:54 AM

## 2017-01-20 ENCOUNTER — Telehealth (HOSPITAL_COMMUNITY): Payer: Self-pay

## 2017-01-20 DIAGNOSIS — Z8659 Personal history of other mental and behavioral disorders: Secondary | ICD-10-CM

## 2017-01-20 MED ORDER — DESIPRAMINE HCL 25 MG PO TABS: 25.0000 mg | ORAL_TABLET | Freq: Every day | ORAL | 1 refills | Status: DC

## 2017-01-20 NOTE — Telephone Encounter (Addendum)
Medication management - Telephone call with pt. to follow up on his message left that he has been taking increased Pamelor 50 mg a day for 1 week and feeling more "foggy and weak".  Informed would contact Dr. Casimiro Needle to see if more changes warranted?  Message left for Dr. Casimiro Needle of patient's reported concerns and problems since increase in Pamelor and requested a call back to follow up.

## 2017-01-20 NOTE — Telephone Encounter (Signed)
Medication management - Telephone call with pt. after discussing patient's reported symptoms this date as Dr. Casimiro Needle discontinued Pamelor and started patient on Desipramine 25 mg, one in the morning, #30 + 1 refill.   Informed patient Dr. Casimiro Needle felt this would give him more energy and wants him to take it in the mornings and stop the Pamelor all together.  States dosages equivalent and no other changes at this time.  New order e-scribed to patient's [place] as verbally ordered by Dr. Casimiro Needle.  Patient to call if any problems with change and to keep appointment on 01/29/17.

## 2017-01-29 ENCOUNTER — Encounter (HOSPITAL_COMMUNITY): Payer: Self-pay | Admitting: Psychiatry

## 2017-01-29 ENCOUNTER — Ambulatory Visit (INDEPENDENT_AMBULATORY_CARE_PROVIDER_SITE_OTHER): Payer: 59 | Admitting: Psychiatry

## 2017-01-29 VITALS — BP 128/76 | HR 100 | Ht 73.0 in | Wt 185.8 lb

## 2017-01-29 DIAGNOSIS — Z79899 Other long term (current) drug therapy: Secondary | ICD-10-CM

## 2017-01-29 DIAGNOSIS — F321 Major depressive disorder, single episode, moderate: Secondary | ICD-10-CM

## 2017-01-29 DIAGNOSIS — F324 Major depressive disorder, single episode, in partial remission: Secondary | ICD-10-CM | POA: Diagnosis not present

## 2017-01-29 MED ORDER — BUPROPION HCL ER (XL) 150 MG PO TB24: ORAL_TABLET | ORAL | 3 refills | Status: DC

## 2017-01-29 MED ORDER — REXULTI 1 MG PO TABS: 1.0000 | ORAL_TABLET | Freq: Every day | ORAL | 3 refills | Status: DC

## 2017-01-29 NOTE — Progress Notes (Signed)
Psychiatric Initial Adult Assessment   Patient Identification: George Proctor MRN:  [id number] Date of Evaluation:  01/29/2017 Referral Source: Dr. Gershon Mussel Heeding Chief Complaint:   Chief Complaint    Follow-up     Visit Diagnosis:    ICD-9-CM ICD-10-CM   1. Major depression single episode, in partial remission ([place]) 296.25 F32.4 B12    History of Present Illness  At this time the patient says he is worse. He says since being on the nortriptyline/desipramine he's felt groggy and energyless. A knowledge is persistent daily depression for very long time. Worse in the morning. He is sleeping 7 hours and doesn't really take naps. His energy level is low. He is eating okay. He says is having problems concentrating but I think that's because he feels groggy. He psychomotor functioning is generally normal or slightly reduced. He feels worthless. He is not suicidal. He shows no evidence of psychosis. Not using any drugs. Today brings me a long list of all the medication she's been on. It is extensive. Unfortunately cannot remember the dose or duration of the medications. When he increase the Rexulti to a dose of 2 mg he felt oversedated and therefore now is only taking 1 mg. I think this is inadequate. It is noted is been on Abilify 2 mg in the past and said it didn't work. But he didn't really remember that it had any bad versus effects. I think the only way to deal with this individual is to go slow   and systematic . unfortunately he did not get x-rays is moving or his puppy. today the patient brings up disability issues. He says is run out of short-term disability. He doesn't know what to do. He's considering applying for long-term disability. His only my third visit with and now is talking to me about long-term disability. I recommended that he contact his primary care doctor is been caring for him for over year DSM about long-term disability. Patient says his therapist Dr. heading cannot write it.   (Hypo) Manic Symptoms:   Anxiety Symptoms:   Psychotic Symptoms:   PTSD Symptoms:   Past Psychiatric History: Seen psychiatrists 2 times been psychiatrically hospitalized 2 times in his made a suicide attempt in the past. Previous Psychotropic Medications: Yes   Substance Abuse History in the last 12 months:  No.  Consequences of Substance Abuse: NA  Past Medical History:  Past Medical History:  Diagnosis Date  . Anxiety   . Depression   . Gait difficulty   . Memory loss     Past Surgical History:  Procedure Laterality Date  . ABDOMINAL SURGERY     Childhood  . BUNIONECTOMY      Family Psychiatric History:   Family History:  Family History  Problem Relation Age of Onset  . Aneurysm Mother 43       Thoracic  . Hypertension Father   . Heart attack Paternal Grandfather     Social History:   Social History   Social History  . Marital status: Married    Spouse name: N/A  . Number of children: 0  . Years of education: [employer]   Occupational History  . 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 Smoking status: Never Smoker  . Smokeless tobacco: Never Used  . Alcohol use No     Comment: Less than one drink per week.  . Drug use: No  . Sexual activity: Not Currently   Other Topics  Concern  . None   Social History Narrative   Lives at home with wife.   Right-handed.   3 cups caffeine per day.    Additional Social History:   Allergies:  No Known Allergies  Metabolic Disorder Labs: No results found for: HGBA1C, MPG Lab Results  Component Value Date   PROLACTIN  11/28/2007    10.1 (NOTE)     Reference Ranges:                 Male:                       2.1 -  17.1 ng/ml                 Male:   Pregnant          9.7 - 208.5 ng/mL                           Non Pregnant      2.8 -  29.2 ng/mL                           Post  Menopausal   1.8 -  20.3 ng/mL                     Lab Results  Component Value Date   CHOL  197 11/03/2007   TRIG 119 11/03/2007   HDL 37.8 (L) 11/03/2007   CHOLHDL 5.2 CALC 11/03/2007   VLDL 24 11/03/2007   LDLCALC 135 (H) 11/03/2007     Current Medications: Current Outpatient Prescriptions  Medication Sig Dispense Refill  . B Complex Vitamins (VITAMIN B COMPLEX PO) Take by mouth.    . desipramine (NORPRAMIN) 25 MG tablet Take 1 tablet (25 mg total) by mouth daily. Take in the morning. 30 tablet 1  . fish oil-omega-3 fatty acids 1000 MG capsule Take 6 g by mouth daily.     . Multiple Vitamin (MULTIVITAMIN) capsule Take 1 capsule by mouth daily.    . ranitidine (ZANTAC) 150 MG capsule Take 150 mg by mouth 2 (two) times daily.    Marland Kitchen REXULTI 1 MG TABS Take 1 tablet (1 mg total) by mouth daily. 30 tablet 3  . amoxicillin-clavulanate (AUGMENTIN) 500-125 MG tablet Take as directed    . buPROPion (WELLBUTRIN XL) 150 MG 24 hr tablet 1  qam  For 1 week then 2 qam 60 tablet 3   No current facility-administered medications for this visit.     Neurologic: Headache: No Seizure: No Paresthesias:No  Musculoskeletal: Strength & Muscle Tone: within normal limits Gait & Station: normal Patient leans: N/A  Psychiatric Specialty Exam: ROS  Blood pressure 128/76, pulse 100, height 6\' 1"  (1.854 m), weight 185 lb 12.8 oz (84.3 kg).Body mass index is 24.51 kg/m.  General Appearance: Casual  Eye Contact:  Good  Speech:  Clear and Coherent  Volume:  Normal  Mood:  Depressed  Affect:  Appropriate  Thought Process:Normal   Orientation:  Full (Time, Place, and Person)  Thought Content:  Logical  Suicidal Thoughts:  No  Homicidal Thoughts:  No  Memory:  NA  Judgement:  Good  Insight:  Good  Psychomotor Activity:  Concentration:    Recall:  Good  Fund of Knowledge:Good  Language: Good  Akathisia:  No  Handed: Right   AIMS (if indicated):    Assets:  Desire for Improvement  ADL's:  Intact  Cognition: WNL  Sleep:      Treatment Plan Summary:   At this time we will asked  the patient to discontinue his tricyclic antidepressant. His level came back low anyway and apparently could not tolerate. With not taking any antidepressant for the next 1 week or a lithium to demonstrate that in fact when he was experiencing was related to the tricyclic again present that we start. I do not complain of symptoms that would've told him that his blood level came back very low. This implies either is not taking it or is not getting enough. At this time we will go ahead and start him on Wellbutrin in one week. He does remember the dose she took MSIR is never been on Wellbutrin and the same time Rexulti. On the other hand doesn't get better I'll consider switching the Rexalti to Abilify. When he returns in about 7 weeks we'll make decisions about the Abilify were also likely increase his Wellbutrin to dose of 450 mg. The patient continue in one-to-one therapy. The possibility of adding Abilify usually is not out of the question. Is also not having depression is the topic of ECT treatments. He recently saw 60 minutes episode which presented ECT  is very positive way. He would consider it. the patient denies any suicidal at this time.

## 2017-01-31 ENCOUNTER — Other Ambulatory Visit: Payer: Self-pay

## 2017-01-31 ENCOUNTER — Ambulatory Visit (HOSPITAL_COMMUNITY): Payer: 59 | Attending: Cardiology

## 2017-01-31 ENCOUNTER — Other Ambulatory Visit (HOSPITAL_COMMUNITY): Payer: Self-pay | Admitting: Psychiatry

## 2017-01-31 DIAGNOSIS — I5030 Unspecified diastolic (congestive) heart failure: Secondary | ICD-10-CM | POA: Diagnosis not present

## 2017-01-31 DIAGNOSIS — I422 Other hypertrophic cardiomyopathy: Secondary | ICD-10-CM | POA: Diagnosis not present

## 2017-01-31 DIAGNOSIS — I77810 Thoracic aortic ectasia: Secondary | ICD-10-CM | POA: Insufficient documentation

## 2017-01-31 DIAGNOSIS — I351 Nonrheumatic aortic (valve) insufficiency: Secondary | ICD-10-CM | POA: Insufficient documentation

## 2017-01-31 DIAGNOSIS — I348 Other nonrheumatic mitral valve disorders: Secondary | ICD-10-CM | POA: Diagnosis not present

## 2017-01-31 DIAGNOSIS — I361 Nonrheumatic tricuspid (valve) insufficiency: Secondary | ICD-10-CM | POA: Insufficient documentation

## 2017-01-31 DIAGNOSIS — R0602 Shortness of breath: Secondary | ICD-10-CM

## 2017-01-31 LAB — VITAMIN B12: Vitamin B-12: 1731 pg/mL — ABNORMAL HIGH (ref 200–1100)

## 2017-01-31 MED ORDER — AMPHETAMINE-DEXTROAMPHETAMINE 10 MG PO TABS: 10.0000 mg | ORAL_TABLET | Freq: Two times a day (BID) | ORAL | 0 refills | Status: DC

## 2017-01-31 MED ORDER — AMPHETAMINE-DEXTROAMPHETAMINE 10 MG PO TABS: ORAL_TABLET | ORAL | 0 refills | Status: DC

## 2017-02-03 ENCOUNTER — Telehealth (HOSPITAL_COMMUNITY): Payer: Self-pay | Admitting: Psychiatry

## 2017-02-03 NOTE — Telephone Encounter (Signed)
02/03/17 1:30pm Patient came to pick-up rx script - HG#[id number].Marland KitchenMariana Proctor

## 2017-02-11 ENCOUNTER — Telehealth (HOSPITAL_COMMUNITY): Payer: Self-pay

## 2017-02-11 NOTE — Telephone Encounter (Signed)
Patient called and said he has been taking the Adderall as prescribed and he is not "feeling" anything. I did not see a note about it in the chart, please review and advise, thank you

## 2017-02-24 ENCOUNTER — Other Ambulatory Visit (HOSPITAL_COMMUNITY): Payer: Self-pay

## 2017-02-27 DIAGNOSIS — R5383 Other fatigue: Secondary | ICD-10-CM | POA: Diagnosis not present

## 2017-02-27 DIAGNOSIS — K219 Gastro-esophageal reflux disease without esophagitis: Secondary | ICD-10-CM | POA: Diagnosis not present

## 2017-02-27 MED ORDER — AMPHETAMINE-DEXTROAMPHETAMINE 10 MG PO TABS: 10.0000 mg | ORAL_TABLET | Freq: Two times a day (BID) | ORAL | 0 refills | Status: DC

## 2017-02-27 NOTE — Telephone Encounter (Signed)
Printed new prescription and called patient to let him know he could come and pick up

## 2017-02-28 ENCOUNTER — Telehealth (HOSPITAL_COMMUNITY): Payer: Self-pay | Admitting: Psychiatry

## 2017-02-28 NOTE — Telephone Encounter (Signed)
[place] picked up prescription on 12/13/05 lic [phone] dlo

## 2017-03-04 ENCOUNTER — Encounter: Payer: Self-pay | Admitting: Internal Medicine

## 2017-03-04 ENCOUNTER — Ambulatory Visit (INDEPENDENT_AMBULATORY_CARE_PROVIDER_SITE_OTHER): Payer: 59 | Admitting: Internal Medicine

## 2017-03-04 VITALS — BP 98/70 | HR 84 | Ht 73.0 in | Wt 179.0 lb

## 2017-03-04 DIAGNOSIS — I351 Nonrheumatic aortic (valve) insufficiency: Secondary | ICD-10-CM

## 2017-03-04 DIAGNOSIS — F3289 Other specified depressive episodes: Secondary | ICD-10-CM

## 2017-03-04 DIAGNOSIS — R0602 Shortness of breath: Secondary | ICD-10-CM | POA: Diagnosis not present

## 2017-03-04 DIAGNOSIS — I77810 Thoracic aortic ectasia: Secondary | ICD-10-CM

## 2017-03-04 NOTE — Progress Notes (Signed)
OFFICE CONSULT NOTE  Chief Complaint:  Continued fatigue and depression.  Primary Care Physician: [name], MD  HPI:  George Proctor is a 61 y.o. male who is being seen today for the evaluation of the above chief complaints at the request of [name], MD. George Proctor has a history of anxiety and depression as well as memory loss in the past. Recently he's been suffering from shortness of breath, fatigue, mental fogginess, anhedonia and symptoms concerning for depression. He denies any anginal symptoms, per se. His shortness of breath sometimes is associated with exertion and can be present at rest. He says that he's had a mental fogginess and has definitely struggled with depression symptoms. He is currently on nortriptyline for his depression. Otherwise he takes no medications. Is no history of hypertension, diabetes or significant dyslipidemia. Family history is significant for hypertension and heart disease in his father but he still living at age 35.   03/04/2017  George Proctor returns today for follow-up. He continues to report some significant fatigue and depression. He has been referred to a psychiatrist who started him on Adderall, bupropion and Rexulti with diagnosis of depression and ADD. He says now he feels worse than he was when he started the medications. He says he is extremely slow to get moving in the morning after starting on the medications. Today we reviewed his echocardiogram which demonstrates further effacement of the aortic valve measuring 4.6 cm at the sinus of Valsalva with mild to moderate aortic insufficiency. He is also hypotensive today 98/70. He is not on any blood pressure medication and reports she's had some occasional positional dizziness. This could be related to [place], which is a fairly newer medication, but has listed orthostatic hypotension as a side effect. Again, George Proctor had a cardiac MRA in 2015 which showed some worsening of his aortic root dilatation  compared to an earlier study, but no ascending aortic aneurysm. This echocardiogram also demonstrated an ascending aortic measurement of 3.8 cm. He reported that his mother died of a thoracic aneurysm as well.  PMHx:  Past Medical History:  Diagnosis Date  . Anxiety   . Depression   . Gait difficulty   . Memory loss     Past Surgical History:  Procedure Laterality Date  . ABDOMINAL SURGERY     Childhood  . BUNIONECTOMY      FAMHx:  Family History  Problem Relation Age of Onset  . Aneurysm Mother 54       Thoracic  . Hypertension Father   . Heart attack Paternal Grandfather     SOCHx:   reports that he has never smoked. He has never used smokeless tobacco. He reports that he does not drink alcohol or use drugs.  ALLERGIES:  No Known Allergies  ROS: Pertinent items noted in HPI and remainder of comprehensive ROS otherwise negative.  HOME MEDS: Current Outpatient Prescriptions on File Prior to Visit  Medication Sig Dispense Refill  . amphetamine-dextroamphetamine (ADDERALL) 10 MG tablet Take 1 tablet (10 mg total) by mouth 2 (two) times daily with a meal. 60 tablet 0  . B Complex Vitamins (VITAMIN B COMPLEX PO) Take by mouth.    Marland Kitchen buPROPion (WELLBUTRIN XL) 150 MG 24 hr tablet 1  qam  For 1 week then 2 qam 60 tablet 3  . fish oil-omega-3 fatty acids 1000 MG capsule Take 6 g by mouth daily.     . Multiple Vitamin (MULTIVITAMIN) capsule Take 1 capsule by mouth daily.    Marland Kitchen  ranitidine (ZANTAC) 150 MG capsule Take 150 mg by mouth as needed.     Marland Kitchen REXULTI 1 MG TABS Take 1 tablet (1 mg total) by mouth daily. 30 tablet 3   No current facility-administered medications on file prior to visit.     LABS/IMAGING: No results found for this or any previous visit (from the past 48 hour(s)). No results found.  LIPID PANEL:    Component Value Date/Time   CHOL 197 11/03/2007 1136   TRIG 119 11/03/2007 1136   HDL 37.8 (L) 11/03/2007 1136   CHOLHDL 5.2 CALC 11/03/2007 1136   VLDL  24 11/03/2007 1136   LDLCALC 135 (H) 11/03/2007 1136    WEIGHTS: Wt Readings from Last 3 Encounters:  03/04/17 179 lb (81.2 kg)  01/16/17 187 lb 6.4 oz (85 kg)  09/11/16 194 lb (88 kg)    VITALS: BP 98/70   Pulse 84   Ht 6\' 1"  (1.854 m)   Wt 179 lb (81.2 kg)   BMI 23.62 kg/m   EXAM: Deferred  EKG: Deferred  ASSESSMENT: 1. Aortic root dilatation-measuring 4.6 cm (01/2017) 2. Mild to moderate aortic insufficiency 3. Shortness of breath, fatigue 4. Probable depression 5. Orthostatic hypotension  PLAN: 1.   George Proctor has an enlarging aortic root now measuring 4.6 cm with mild to moderate aortic insufficiency. Unfortunately he's been having orthostatic hypotension and blood pressure is low today. This may be related to his new medications for depression. He has a follow-up with his psychiatrist tomorrow and says that he does not feel that his symptoms have improved on medications. It may be that one or combination of the medications could be causing him orthostatic hypotension as well. I expect that they will be weaned off. In some instances patients with this degree of aortic insufficiency may benefit from low-dose diuretic however there is no room with blood pressure and orthostatic hypotension to do that at this time. I would recommend a repeat echocardiogram in one year to further evaluate for worsening aortic root dilatation and the severity of aortic insufficiency.  Follow-up in 1 year.  Pixie Casino, MD, [place]  Attending Cardiologist  Direct Dial: [phone]  Fax: [phone]  Website:  [email or web address] 03/04/2017, 12:15 PM

## 2017-03-04 NOTE — Patient Instructions (Signed)
Your physician has requested that you have an echocardiogram in [place]. Echocardiography is a painless test that uses sound waves to create images of your heart. It provides your doctor with information about the size and shape of your heart and how well your heart's chambers and valves are working. This procedure takes approximately one hour. There are no restrictions for this procedure.  Your physician wants you to follow-up in: ONE YEAR with Dr. Debara Pickett - after echo. You will receive a reminder letter in the mail two months in advance. If you don't receive a letter, please call our office to schedule the follow-up appointment.

## 2017-03-05 ENCOUNTER — Ambulatory Visit (INDEPENDENT_AMBULATORY_CARE_PROVIDER_SITE_OTHER): Payer: 59 | Admitting: Psychiatry

## 2017-03-05 ENCOUNTER — Encounter (HOSPITAL_COMMUNITY): Payer: Self-pay | Admitting: Psychiatry

## 2017-03-05 VITALS — BP 118/70 | HR 101 | Ht 73.0 in | Wt 179.8 lb

## 2017-03-05 DIAGNOSIS — F329 Major depressive disorder, single episode, unspecified: Secondary | ICD-10-CM

## 2017-03-05 DIAGNOSIS — Z79899 Other long term (current) drug therapy: Secondary | ICD-10-CM

## 2017-03-05 DIAGNOSIS — Z915 Personal history of self-harm: Secondary | ICD-10-CM | POA: Diagnosis not present

## 2017-03-05 DIAGNOSIS — F339 Major depressive disorder, recurrent, unspecified: Secondary | ICD-10-CM

## 2017-03-05 MED ORDER — REXULTI 1 MG PO TABS: 1.0000 | ORAL_TABLET | Freq: Every day | ORAL | 3 refills | Status: DC

## 2017-03-05 MED ORDER — AMPHETAMINE-DEXTROAMPHETAMINE 10 MG PO TABS: ORAL_TABLET | ORAL | 0 refills | Status: DC

## 2017-03-05 NOTE — Progress Notes (Signed)
Psychiatric Initial Adult Assessment   Patient Identification: George Proctor MRN:  [id number] Date of Evaluation:  03/05/2017 Referral Source: Dr. Gershon Mussel Heeding Chief Complaint:    Visit Diagnosis:  No diagnosis found.  History of Present Illness So at this time the patient is back to work. I think actually he is doing fair. He feels more comfortable. 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 Been doing this job for over 30 years. 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 His supervisor is okay. The patient still feels somewhat sad but it don't think it's intense. He sleeps and eats well. His energy level is reasonably good. He can think and concentrate reasonably good. Enjoys the TV but not a lot of other things. Right now he does not live with his wife as they're in the process of moving. Patient continues in therapy. It is noted the patient did have [place] and it did not help. Her last visit we did talk about ECT but a think the patient isn't as bad to me ECT. I wanted to try to get his mind set off the idea that the pills get a make a big difference. It might be the patient has except at the level of depression he is has now might be one that he'll live with. Much like someone is disabled from condition that is not going to get much better. On the other hand he is functioning. The other hand he sleeps and eats okay. It is noted there is some confusion about his Adderall. Their efforts to increase it from 20-30 mg but that apparently didn't happen. He also is yet to take the maximum dose of well. Disability was not discussed today. Anxiety Symptoms:   Psychotic Symptoms:   PTSD Symptoms:   Past Psychiatric History: Seen psychiatrists 2 times been psychiatrically hospitalized 2 times in his made a suicide attempt in the past. Previous Psychotropic Medications: Yes   Substance Abuse History in  the last 12 months:  No.  Consequences of Substance Abuse: NA  Past Medical History:  Past Medical History:  Diagnosis Date  . Anxiety   . Depression   . Gait difficulty   . Memory loss     Past Surgical History:  Procedure Laterality Date  . ABDOMINAL SURGERY     Childhood  . BUNIONECTOMY      Family Psychiatric History:   Family History:  Family History  Problem Relation Age of Onset  . Aneurysm Mother 83       Thoracic  . Hypertension Father   . Heart attack Paternal Grandfather     Social History:   Social History   Social History  . Marital status: Married    Spouse name: N/A  . Number of children: 0  . Years of education: [employer]   Occupational History  . 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 Smoking status: Never Smoker  . Smokeless tobacco: Never Used  . Alcohol use No     Comment: Less than one drink per week.  . Drug use: No  . Sexual activity: Not Currently   Other Topics Concern  . None   Social History Narrative   Lives at home with wife.   Right-handed.   3 cups caffeine per day.    Additional Social History:  Allergies:  No Known Allergies  Metabolic Disorder Labs: No results found for: HGBA1C, MPG Lab Results  Component Value Date   PROLACTIN  11/28/2007    10.1 (NOTE)     Reference Ranges:                 Male:                       2.1 -  17.1 ng/ml                 Male:   Pregnant          9.7 - 208.5 ng/mL                           Non Pregnant      2.8 -  29.2 ng/mL                           Post  Menopausal   1.8 -  20.3 ng/mL                     Lab Results  Component Value Date   CHOL 197 11/03/2007   TRIG 119 11/03/2007   HDL 37.8 (L) 11/03/2007   CHOLHDL 5.2 CALC 11/03/2007   VLDL 24 11/03/2007   LDLCALC 135 (H) 11/03/2007     Current Medications: Current Outpatient Prescriptions  Medication Sig Dispense Refill  . amphetamine-dextroamphetamine (ADDERALL) 10 MG tablet 2   qammm 1  q noon   Fil after 7/20/ 2018 90 tablet 0  . B Complex Vitamins (VITAMIN B COMPLEX PO) Take by mouth.    Marland Kitchen buPROPion (WELLBUTRIN XL) 150 MG 24 hr tablet 1  qam  For 1 week then 2 qam 60 tablet 3  . fish oil-omega-3 fatty acids 1000 MG capsule Take 6 g by mouth daily.     . Multiple Vitamin (MULTIVITAMIN) capsule Take 1 capsule by mouth daily.    . ranitidine (ZANTAC) 150 MG capsule Take 150 mg by mouth as needed.     Marland Kitchen REXULTI 1 MG TABS Take 1 tablet (1 mg total) by mouth daily. 30 tablet 3   No current facility-administered medications for this visit.     Neurologic: Headache: No Seizure: No Paresthesias:No  Musculoskeletal: Strength & Muscle Tone: within normal limits Gait & Station: normal Patient leans: N/A  Psychiatric Specialty Exam: ROS  Blood pressure 118/70, pulse (!) 101, height 6\' 1"  (1.854 m), weight 179 lb 12.8 oz (81.6 kg).Body mass index is 23.72 kg/m.  General Appearance: Casual  Eye Contact:  Good  Speech:  Clear and Coherent  Volume:  Normal  Mood:  Depressed  Affect:  Appropriate  Thought Process:Normal   Orientation:  Full (Time, Place, and Person)  Thought Content:  Logical  Suicidal Thoughts:  No  Homicidal Thoughts:  No  Memory:  NA  Judgement:  Good  Insight:  Good  Psychomotor Activity:  Concentration:    Recall:  Good  Fund of Knowledge:Good  Language: Good  Akathisia:  No  Handed: Right   AIMS (if indicated):    Assets:  Desire for Improvement  ADL's:  Intact  Cognition: WNL  Sleep:      Treatment Plan Summary:   At this time we will make perhaps one of her last maneuvers. We'll increase his Wellbutrin from 300 mg to a maximum dose of  450 continue taking Rexulti I1 milligram. Higher doses cause some problems. Today we'll go ahead and increase his Adderall to taking a 10 mg pill to in the morning and one at lunch. This would be the highest dose we give both of these medications. Not sure if there are other interventions that  we have but he'll return to see [place] in 2 months. Continue in therapy. This patient is not suicidal. Doesn't seem quite is desperate for as impaired. He is now back to work which is very important.Marland Kitchen

## 2017-03-10 DIAGNOSIS — Z0000 Encounter for general adult medical examination without abnormal findings: Secondary | ICD-10-CM | POA: Diagnosis not present

## 2017-03-10 DIAGNOSIS — Z125 Encounter for screening for malignant neoplasm of prostate: Secondary | ICD-10-CM | POA: Diagnosis not present

## 2017-03-13 DIAGNOSIS — Z8669 Personal history of other diseases of the nervous system and sense organs: Secondary | ICD-10-CM | POA: Diagnosis not present

## 2017-03-13 DIAGNOSIS — Z1159 Encounter for screening for other viral diseases: Secondary | ICD-10-CM | POA: Diagnosis not present

## 2017-03-13 DIAGNOSIS — Z0001 Encounter for general adult medical examination with abnormal findings: Secondary | ICD-10-CM | POA: Diagnosis not present

## 2017-03-13 DIAGNOSIS — Z8269 Family history of other diseases of the musculoskeletal system and connective tissue: Secondary | ICD-10-CM | POA: Diagnosis not present

## 2017-03-13 DIAGNOSIS — Z1212 Encounter for screening for malignant neoplasm of rectum: Secondary | ICD-10-CM | POA: Diagnosis not present

## 2017-05-12 ENCOUNTER — Other Ambulatory Visit (HOSPITAL_COMMUNITY): Payer: Self-pay

## 2017-05-12 ENCOUNTER — Telehealth (HOSPITAL_COMMUNITY): Payer: Self-pay

## 2017-05-12 DIAGNOSIS — Z23 Encounter for immunization: Secondary | ICD-10-CM | POA: Diagnosis not present

## 2017-05-12 DIAGNOSIS — R5383 Other fatigue: Secondary | ICD-10-CM | POA: Diagnosis not present

## 2017-05-12 DIAGNOSIS — K219 Gastro-esophageal reflux disease without esophagitis: Secondary | ICD-10-CM | POA: Diagnosis not present

## 2017-05-12 MED ORDER — AMPHETAMINE-DEXTROAMPHETAMINE 10 MG PO TABS: ORAL_TABLET | ORAL | 0 refills | Status: DC

## 2017-05-12 NOTE — Telephone Encounter (Signed)
This is a George Proctor patient, he has a follow up next month, but is out of his Adderall as of today. Can you refill for one month? Please review and advise, thank you

## 2017-05-12 NOTE — Telephone Encounter (Signed)
Okay, I printed the prescription and called patient to let him know it was ready

## 2017-05-12 NOTE — Telephone Encounter (Signed)
Yes that's fine. [facility] reviewed, he is due for refill

## 2017-05-28 ENCOUNTER — Ambulatory Visit (INDEPENDENT_AMBULATORY_CARE_PROVIDER_SITE_OTHER): Payer: 59 | Admitting: Psychiatry

## 2017-05-28 DIAGNOSIS — F341 Dysthymic disorder: Secondary | ICD-10-CM

## 2017-05-28 DIAGNOSIS — F329 Major depressive disorder, single episode, unspecified: Secondary | ICD-10-CM

## 2017-05-28 MED ORDER — DEPLIN 15 15-90.314 MG PO CAPS: 15.0000 mg | ORAL_CAPSULE | Freq: Every morning | ORAL | 5 refills | Status: DC

## 2017-05-28 MED ORDER — REXULTI 1 MG PO TABS: 1.0000 | ORAL_TABLET | Freq: Every day | ORAL | 4 refills | Status: DC

## 2017-05-28 MED ORDER — BUPROPION HCL ER (XL) 150 MG PO TB24: ORAL_TABLET | ORAL | 3 refills | Status: DC

## 2017-05-28 NOTE — Progress Notes (Signed)
Psychiatric Initial Adult Assessment   Patient Identification: George Proctor MRN:  [id number] Date of Evaluation:  05/28/2017 Referral Source: Dr. Gershon Mussel Heeding Chief Complaint:    Visit Diagnosis:  No diagnosis found.  History of Present Illness This patient continues to have confusion around his medicines. When his last visit we asked him to increase as well as potential full dose of 450 mg but he did not follow that instruction. He's only been taking 300 mg. At this time the patient says that his depression is no better probably somewhat worse. Generally he says is exhausted and worn out. He says he is overwhelmed. Is overwhelming disease trying to get his old home ready to get onto the market. Apparently his wife is not supportive. His wife has a dog which apparently loses control of bladder. At this time his wife and he will living together in a new house in [place] but he still finally get the old house taking care of her. The reality is that these work is actually better. He says it is gotten lighter. Work is not as hard. He's not really overwhelmed at work per se. Interview the patient been a lot of different medications as adjuvant treatment for his depression. He's been on Provigil in the past but it failed. He's been on lithium but had problems. He's been on Strattera but it did not work. He's tried a higher dose of Rexulti but produced side effects. The patient is been on Fetzima but it caused side effects as well. Noted is the patient has had [place] in the past but it did not work. The patient continues in therapy. His therapist is trying to get to do more things out of work setting for enjoyment. The patient seems exhausted. He admits to feeling worthless. He denies being suicidal and tense way. He says is been chronically thinking about suicide for many years. Thinks about it every day for short period of time. The patient denies being acutely suicidal time. We increased his Adderall he says  it didn't really have much of an effect.   Psychotic Symptoms:   PTSD Symptoms:   Past Psychiatric History: Seen psychiatrists 2 times been psychiatrically hospitalized 2 times in his made a suicide attempt in the past. Previous Psychotropic Medications: Yes   Substance Abuse History in the last 12 months:  No.  Consequences of Substance Abuse: NA  Past Medical History:  Past Medical History:  Diagnosis Date  . Anxiety   . Depression   . Gait difficulty   . Memory loss     Past Surgical History:  Procedure Laterality Date  . ABDOMINAL SURGERY     Childhood  . BUNIONECTOMY      Family Psychiatric History:   Family History:  Family History  Problem Relation Age of Onset  . Aneurysm Mother 47       Thoracic  . Hypertension Father   . Heart attack Paternal Grandfather     Social History:   Social History   Social History  . Marital status: Married    Spouse name: N/A  . Number of children: 0  . Years of education: [employer]   Occupational History  . 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 Smoking status: Never Smoker  . Smokeless tobacco: Never Used  . Alcohol use No     Comment: Less than one drink per week.  . Drug use: No  . Sexual activity: Not Currently  Other Topics Concern  . Not on file   Social History Narrative   Lives at home with wife.   Right-handed.   3 cups caffeine per day.    Additional Social History:   Allergies:  No Known Allergies  Metabolic Disorder Labs: No results found for: HGBA1C, MPG Lab Results  Component Value Date   PROLACTIN  11/28/2007    10.1 (NOTE)     Reference Ranges:                 Male:                       2.1 -  17.1 ng/ml                 Male:   Pregnant          9.7 - 208.5 ng/mL                           Non Pregnant      2.8 -  29.2 ng/mL                           Post  Menopausal   1.8 -  20.3 ng/mL                     Lab Results  Component Value Date   CHOL  197 11/03/2007   TRIG 119 11/03/2007   HDL 37.8 (L) 11/03/2007   CHOLHDL 5.2 CALC 11/03/2007   VLDL 24 11/03/2007   LDLCALC 135 (H) 11/03/2007     Current Medications: Current Outpatient Prescriptions  Medication Sig Dispense Refill  . amphetamine-dextroamphetamine (ADDERALL) 10 MG tablet 2  qam 1 q noon 90 tablet 0  . B Complex Vitamins (VITAMIN B COMPLEX PO) Take by mouth.    Marland Kitchen buPROPion (WELLBUTRIN XL) 150 MG 24 hr tablet 3  qam 90 tablet 3  . fish oil-omega-3 fatty acids 1000 MG capsule Take 6 g by mouth daily.     Marland Kitchen L-Methylfolate-Algae (DEPLIN 15) 15-90.314 MG CAPS Take 15 mg by mouth every morning. 30 capsule 5  . Multiple Vitamin (MULTIVITAMIN) capsule Take 1 capsule by mouth daily.    . ranitidine (ZANTAC) 150 MG capsule Take 150 mg by mouth as needed.     Marland Kitchen REXULTI 1 MG TABS Take 1 tablet (1 mg total) by mouth daily. 30 tablet 4   No current facility-administered medications for this visit.     Neurologic: Headache: No Seizure: No Paresthesias:No  Musculoskeletal: Strength & Muscle Tone: within normal limits Gait & Station: normal Patient leans: N/A  Psychiatric Specialty Exam: ROS  There were no vitals taken for this visit.There is no height or weight on file to calculate BMI.  General Appearance: Casual  Eye Contact:  Good  Speech:  Clear and Coherent  Volume:  Normal  Mood:  Depressed  Affect:  Appropriate  Thought Process:Normal   Orientation:  Full (Time, Place, and Person)  Thought Content:  Logical  Suicidal Thoughts:  No  Homicidal Thoughts:  No  Memory:  NA  Judgement:  Good  Insight:  Good  Psychomotor Activity:  Concentration:    Recall:  Good  Fund of Knowledge:Good  Language: Good  Akathisia:  No  Handed: Right   AIMS (if indicated):    Assets:  Desire for Improvement  ADL's:  Intact  Cognition:  WNL  Sleep:      Treatment Plan Summary:   At this time we'll go ahead and taper him off Adderall over the next week. The patient will  begin on Deplin 15 mg and continue Wellbutrin but adjust the dose to the proper dose of 450 mg. He is yet to be on this higher dose. Is my hope with Deplin together with the high-dose Wellbutrin to get some response. She'll continue taking 1 mg of  Rexulti.I do think there were running out of medication intervention. I do not think this patient is acutely suicidal. In reality is functioning quite well. He works full-time and then tries to self/cleanup his old house. I think after next visit I will assist his wife come with him. Continue care doctor Heeding for therapy.

## 2017-05-29 DIAGNOSIS — R5383 Other fatigue: Secondary | ICD-10-CM | POA: Diagnosis not present

## 2017-05-29 DIAGNOSIS — K219 Gastro-esophageal reflux disease without esophagitis: Secondary | ICD-10-CM | POA: Diagnosis not present

## 2017-07-16 ENCOUNTER — Ambulatory Visit (INDEPENDENT_AMBULATORY_CARE_PROVIDER_SITE_OTHER): Payer: 59 | Admitting: Psychiatry

## 2017-07-16 DIAGNOSIS — F320 Major depressive disorder, single episode, mild: Secondary | ICD-10-CM | POA: Diagnosis not present

## 2017-07-16 MED ORDER — BUPROPION HCL ER (XL) 150 MG PO TB24: ORAL_TABLET | ORAL | 3 refills | Status: DC

## 2017-07-16 MED ORDER — DEPLIN 15 15-90.314 MG PO CAPS: 15.0000 mg | ORAL_CAPSULE | Freq: Every morning | ORAL | 5 refills | Status: DC

## 2017-07-16 MED ORDER — NEFAZODONE HCL 100 MG PO TABS: ORAL_TABLET | ORAL | 2 refills | Status: DC

## 2017-07-16 NOTE — Progress Notes (Signed)
Psychiatric Initial Adult Assessment   Patient Identification: George Proctor MRN:  [id number] Date of Evaluation:  07/16/2017 Referral Source: Dr. Gershon Mussel Heeding Chief Complaint:    Visit Diagnosis:  No diagnosis found.  History of Present Illness This patient continues to have confusion around his medicines. When his last visit we asked him to increase as well as potential full dose of 450 mg but he did not follow that instruction. He's only been taking 300 mg. At this time the patient says that his depression is no better probably somewhat worse. Generally he says is exhausted and worn out. He says he is overwhelmed. Is overwhelming disease trying to get his old home ready to get onto the market. Apparently his wife is not supportive. His wife has a dog which apparently loses control of bladder. At this time his wife and he will living together in a new house in [place] but he still finally get the old house taking care of her. The reality is that these work is actually better. He says it is gotten lighter. Work is not as hard. He's not really overwhelmed at work per se. Interview the patient been a lot of different medications as adjuvant treatment for his depression. He's been on Provigil in the past but it failed. He's been on lithium but had problems. He's been on Strattera but it did not work. He's tried a higher dose of Rexulti but produced side effects. The patient is been on Fetzima but it caused side effects as well. Noted is the patient has had [place] in the past but it did not work. The patient continues in therapy. His therapist is trying to get to do more things out of work setting for enjoyment. The patient seems exhausted. He admits to feeling worthless. He denies being suicidal and tense way. He says is been chronically thinking about suicide for many years. Thinks about it every day for short period of time. The patient denies being acutely suicidal time. We increased his Adderall he  says it didn't really have much of an effect.   Psychotic Symptoms:   PTSD Symptoms:   Past Psychiatric History: Seen psychiatrists 2 times been psychiatrically hospitalized 2 times in his made a suicide attempt in the past. Previous Psychotropic Medications: Yes   Substance Abuse History in the last 12 months:  No.  Consequences of Substance Abuse: NA  Past Medical History:  Past Medical History:  Diagnosis Date  . Anxiety   . Depression   . Gait difficulty   . Memory loss     Past Surgical History:  Procedure Laterality Date  . ABDOMINAL SURGERY     Childhood  . BUNIONECTOMY      Family Psychiatric History:   Family History:  Family History  Problem Relation Age of Onset  . Aneurysm Mother 11       Thoracic  . Hypertension Father   . Heart attack Paternal Grandfather     Social History:   Social History   Social History  . Marital status: Married    Spouse name: N/A  . Number of children: 0  . Years of education: [employer]   Occupational History  . 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 Smoking status: Never Smoker  . Smokeless tobacco: Never Used  . Alcohol use No     Comment: Less than one drink per week.  . Drug use: No  . Sexual activity: Not Currently  Other Topics Concern  . Not on file   Social History Narrative   Lives at home with wife.   Right-handed.   3 cups caffeine per day.    Additional Social History:   Allergies:  No Known Allergies  Metabolic Disorder Labs: No results found for: HGBA1C, MPG Lab Results  Component Value Date   PROLACTIN  11/28/2007    10.1 (NOTE)     Reference Ranges:                 Male:                       2.1 -  17.1 ng/ml                 Male:   Pregnant          9.7 - 208.5 ng/mL                           Non Pregnant      2.8 -  29.2 ng/mL                           Post  Menopausal   1.8 -  20.3 ng/mL                     Lab Results  Component Value Date    CHOL 197 11/03/2007   TRIG 119 11/03/2007   HDL 37.8 (L) 11/03/2007   CHOLHDL 5.2 CALC 11/03/2007   VLDL 24 11/03/2007   LDLCALC 135 (H) 11/03/2007     Current Medications: Current Outpatient Prescriptions  Medication Sig Dispense Refill  . B Complex Vitamins (VITAMIN B COMPLEX PO) Take by mouth.    Marland Kitchen buPROPion (WELLBUTRIN XL) 150 MG 24 hr tablet 3  qam 90 tablet 3  . fish oil-omega-3 fatty acids 1000 MG capsule Take 6 g by mouth daily.     Marland Kitchen L-Methylfolate-Algae (DEPLIN 15) 15-90.314 MG CAPS Take 15 mg by mouth every morning. 30 capsule 5  . Multiple Vitamin (MULTIVITAMIN) capsule Take 1 capsule by mouth daily.    . nefazodone (SERZONE) 100 MG tablet 1  qam 30 tablet 2  . ranitidine (ZANTAC) 150 MG capsule Take 150 mg by mouth as needed.     Marland Kitchen REXULTI 1 MG TABS Take 1 tablet (1 mg total) by mouth daily. 30 tablet 4   No current facility-administered medications for this visit.     Neurologic: Headache: No Seizure: No Paresthesias:No  Musculoskeletal: Strength & Muscle Tone: within normal limits Gait & Station: normal Patient leans: N/A  Psychiatric Specialty Exam: ROS  There were no vitals taken for this visit.There is no height or weight on file to calculate BMI.  General Appearance: Casual  Eye Contact:  Good  Speech:  Clear and Coherent  Volume:  Normal  Mood:  Depressed  Affect:  Appropriate  Thought Process:Normal   Orientation:  Full (Time, Place, and Person)  Thought Content:  Logical  Suicidal Thoughts:  No  Homicidal Thoughts:  No  Memory:  NA  Judgement:  Good  Insight:  Good  Psychomotor Activity:  Concentration:    Recall:  Good  Fund of Knowledge:Good  Language: Good  Akathisia:  No  Handed: Right   AIMS (if indicated):    Assets:  Desire for Improvement  ADL's:  Intact  Cognition: WNL  Sleep:  Treatment Plan Summary:   At this time we'll go ahead and taper him off Adderall over the next week. The patient will begin on Deplin 15 mg  and continue Wellbutrin but adjust the dose to the proper dose of 450 mg. He is yet to be on this higher dose. Is my hope with Deplin together with the high-dose Wellbutrin to get some response. She'll continue taking 1 mg of  Rexulti.I do think there were running out of medication intervention. I do not think this patient is acutely suicidal. In reality is functioning quite well. He works full-time and then tries to self/cleanup his old house. I think after next visit I will assist his wife come with him. Continue care doctor Heeding for therapy.

## 2017-07-16 NOTE — Progress Notes (Signed)
Psychiatric Initial Adult Assessment   Patient Identification: George Proctor MRN:  [id number] Date of Evaluation:  07/16/2017 Referral Source: Dr. Gershon Mussel Heeding Chief Complaint:    Visit Diagnosis:  No diagnosis found.  History of Present Illness  At this time the patient shows no improvement. He's had additional stress of the death of his father acutely about 2 weeks ago. His father was 59 years of age. The patient says it really hasn't made an effect. He says is still depressed and hasn't changed. This patient clearly has a chronic dysthymic disorder. He is actively engaged in therapy but shows little. He is able to work. He describes persistent daily depression. As noted he seems a bit better over the last 6 months but it's hard to measure. He describes early morning awakening some subtle. He goes today he 10 and sleeps at least 6 or 7 hours. His appetite is fair his energy level is poor. Testes are related to work concentration. The patient is not acutely suicidal. Is a constant state of being worthless. He shows no evidence of psychosis. He does not drink any alcohol. He has been taking the Deplin as ordered. Today reviewed his medications in the past. Reviewed his old notes. The patient says that he's been on Remeron in the past but does not dose. He says it was over sedating. The patient is never been on Serzone. I'm not clear how supportive the patient's wife is.  Psychotic Symptoms:   PTSD Symptoms:   Past Psychiatric History: Seen psychiatrists 2 times been psychiatrically hospitalized 2 times in his made a suicide attempt in the past. Previous Psychotropic Medications: Yes   Substance Abuse History in the last 12 months:  No.  Consequences of Substance Abuse: NA  Past Medical History:  Past Medical History:  Diagnosis Date  . Anxiety   . Depression   . Gait difficulty   . Memory loss     Past Surgical History:  Procedure Laterality Date  . ABDOMINAL SURGERY     Childhood  . BUNIONECTOMY      Family Psychiatric History:   Family History:  Family History  Problem Relation Age of Onset  . Aneurysm Mother 47       Thoracic  . Hypertension Father   . Heart attack Paternal Grandfather     Social History:   Social History   Social History  . Marital status: Married    Spouse name: N/A  . Number of children: 0  . Years of education: [employer]   Occupational History  . 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 Smoking status: Never Smoker  . Smokeless tobacco: Never Used  . Alcohol use No     Comment: Less than one drink per week.  . Drug use: No  . Sexual activity: Not Currently   Other Topics Concern  . Not on file   Social History Narrative   Lives at home with wife.   Right-handed.   3 cups caffeine per day.    Additional Social History:   Allergies:  No Known Allergies  Metabolic Disorder Labs: No results found for: HGBA1C, MPG Lab Results  Component Value Date   PROLACTIN  11/28/2007    10.1 (NOTE)     Reference Ranges:                 Male:  2.1 -  17.1 ng/ml                 Male:   Pregnant          9.7 - 208.5 ng/mL                           Non Pregnant      2.8 -  29.2 ng/mL                           Post  Menopausal   1.8 -  20.3 ng/mL                     Lab Results  Component Value Date   CHOL 197 11/03/2007   TRIG 119 11/03/2007   HDL 37.8 (L) 11/03/2007   CHOLHDL 5.2 CALC 11/03/2007   VLDL 24 11/03/2007   LDLCALC 135 (H) 11/03/2007     Current Medications: Current Outpatient Prescriptions  Medication Sig Dispense Refill  . B Complex Vitamins (VITAMIN B COMPLEX PO) Take by mouth.    Marland Kitchen buPROPion (WELLBUTRIN XL) 150 MG 24 hr tablet 3  qam 90 tablet 3  . fish oil-omega-3 fatty acids 1000 MG capsule Take 6 g by mouth daily.     Marland Kitchen L-Methylfolate-Algae (DEPLIN 15) 15-90.314 MG CAPS Take 15 mg by mouth every morning. 30 capsule 5  . Multiple Vitamin  (MULTIVITAMIN) capsule Take 1 capsule by mouth daily.    . nefazodone (SERZONE) 100 MG tablet 1  qam 30 tablet 2  . ranitidine (ZANTAC) 150 MG capsule Take 150 mg by mouth as needed.     Marland Kitchen REXULTI 1 MG TABS Take 1 tablet (1 mg total) by mouth daily. 30 tablet 4   No current facility-administered medications for this visit.     Neurologic: Headache: No Seizure: No Paresthesias:No  Musculoskeletal: Strength & Muscle Tone: within normal limits Gait & Station: normal Patient leans: N/A  Psychiatric Specialty Exam: ROS  There were no vitals taken for this visit.There is no height or weight on file to calculate BMI.  General Appearance: Casual  Eye Contact:  Good  Speech:  Clear and Coherent  Volume:  Normal  Mood:  Depressed  Affect:  Appropriate  Thought Process:Normal   Orientation:  Full (Time, Place, and Person)  Thought Content:  Logical  Suicidal Thoughts:  No  Homicidal Thoughts:  No  Memory:  NA  Judgement:  Good  Insight:  Good  Psychomotor Activity:  Concentration:    Recall:  Good  Fund of Knowledge:Good  Language: Good  Akathisia:  No  Handed: Right   AIMS (if indicated):    Assets:  Desire for Improvement  ADL's:  Intact  Cognition: WNL  Sleep:      Treatment Plan Summary: The first problem for this patient which is a persistent problem is that of chronic clinical depression. He believes he has a dysthymic disorder probably a persistent depression disorder.At this time we'll asked him to continue taking his Deplin and his Wellbutrin at maximum doses. When asked him to discontinue his Rexulti as it does not seem to help lower doses at higher doses produced side effects. Today we will add Serzone 100 mg to his medications. We reviewed all his medications including the fact that he try to the pasthe had side effects. We've asked him to return in 6-7 weeks bring his wife with  him. I do not think this patient is acutely suicidal. Today we spent over 50% of her  time educating him about his medications and to his counseling. I think he is functioning only fairly well. With his wife when he returns we'll discuss the possible option of ECT. It should be noted that he received [place] but it did not help. The patient will continue in one-to-one talking therapy with Dr. Marylene Buerger. When his next visit artery disease will be to increase his Serzone but also to talk to them about ECT.

## 2017-08-11 ENCOUNTER — Telehealth (HOSPITAL_COMMUNITY): Payer: Self-pay

## 2017-08-11 DIAGNOSIS — F320 Major depressive disorder, single episode, mild: Secondary | ICD-10-CM

## 2017-08-11 NOTE — Telephone Encounter (Signed)
Medication management - Called pt. back after he left a message he was having increased problems managing recent increased work pressures and recent loss of Father. Questioned if there was something he could take for increased anxiety.  Stated he does not want anything that may make him feel "loopy or out of it" and also wanted to verify when he is to take Serzone each day.  Informed Dr. Casimiro Needle had this down for each morning and that is how he states currently taking.  Patient stated he just knew his anxiety has been more lately and causes him problems to relax and function better at work.  Agreed to send message to Dr. Casimiro Needle and Dr. Lovena Le as Dr. Casimiro Needle would not be back in the office this week until Wednesday 08/13/17 and would see if there was anything Dr. Lovena Le would suggest with changes prior.  Patient agreed with plan and returns to see Dr. Casimiro Needle on 09/05/17.

## 2017-08-13 MED ORDER — GABAPENTIN 100 MG PO CAPS: 100.0000 mg | ORAL_CAPSULE | Freq: Three times a day (TID) | ORAL | 0 refills | Status: DC

## 2017-08-13 NOTE — Telephone Encounter (Signed)
Medication management - Called pt back after speaking with Dr. Lovena Le who offered for pt. to try Neurontin 100 mg, TID.  Reviewed potential side effects with patient and agreed to send in 1 time order to his [employer] Pt. to call if any problems as understands risks and benefits.  Patient agreed to also keep appointment 09/05/17 with Dr. Casimiro Needle to follow-up on progress and will call before then if needed. New Neurontin 100mg , one three times a day, #90 with no refills e-scribed as verbally approved by Dr. Lovena Le to patient's [facility] on [facility]

## 2017-08-21 ENCOUNTER — Ambulatory Visit (HOSPITAL_COMMUNITY): Payer: Self-pay | Admitting: Licensed Clinical Social Worker

## 2017-09-04 ENCOUNTER — Ambulatory Visit (HOSPITAL_COMMUNITY): Payer: Self-pay | Admitting: Licensed Clinical Social Worker

## 2017-09-05 ENCOUNTER — Ambulatory Visit (INDEPENDENT_AMBULATORY_CARE_PROVIDER_SITE_OTHER): Payer: 59 | Admitting: Psychiatry

## 2017-09-05 DIAGNOSIS — Z79899 Other long term (current) drug therapy: Secondary | ICD-10-CM | POA: Diagnosis not present

## 2017-09-05 DIAGNOSIS — F341 Dysthymic disorder: Secondary | ICD-10-CM

## 2017-09-05 DIAGNOSIS — F339 Major depressive disorder, recurrent, unspecified: Secondary | ICD-10-CM

## 2017-09-05 MED ORDER — NEFAZODONE HCL 200 MG PO TABS: ORAL_TABLET | ORAL | 5 refills | Status: DC

## 2017-09-05 NOTE — Progress Notes (Signed)
Psychiatric Initial Adult Assessment   Patient Identification: George Proctor MRN:  [id number] Date of Evaluation:  09/05/2017 Referral Source: Dr. Gershon Mussel Heeding Chief Complaint:    Visit Diagnosis:  No diagnosis found.  History of Present Illness  Today the patient is seen for a 15 minute session. He is seen with his wife Gladstone Pih. His wife says that he is no better or worse. It is been like this for years and years. Once again is some confusion about his medicines. He did stop the Adderall which is good and he notes no difference without it. He continues taking Deplin 15 mg and he did increase his long maximum dose of 450 mg. In close evaluation the patient actually is sleeping reasonably well. His appetite is normal. His energy is good enough that he works 40 hours and still tries to get except his old house. Still is pretty exhausted work and she takes that off all that much. He really has no problems thinking concentrating. He says he is not in trouble at work but he doesn't think he does the best job. He would deny being worthless. Self-esteem is probably reduced. He is not suicidal. He drinks no alcohol uses no drugs. The effusion is that he stopped his Rexulti to my surprise. Nonetheless once again he feels no benefits were losses without it. Note is the patient did have CNS did not do well. His other physical complaint is a lot of anxiety seems to affect the GI tract. Since I seen him he called because of anxiety at [facility] began him on Serzone which was our plan for today. He's taken 100 mg without any benefit. PTSD Symptoms:   Past Psychiatric History: Seen psychiatrists 2 times been psychiatrically hospitalized 2 times in his made a suicide attempt in the past. Previous Psychotropic Medications: Yes   Substance Abuse History in the last 12 months:  No.  Consequences of Substance Abuse: NA  Past Medical History:  Past Medical History:  Diagnosis Date  . Anxiety   .  Depression   . Gait difficulty   . Memory loss     Past Surgical History:  Procedure Laterality Date  . ABDOMINAL SURGERY     Childhood  . BUNIONECTOMY      Family Psychiatric History:   Family History:  Family History  Problem Relation Age of Onset  . Aneurysm Mother 57       Thoracic  . Hypertension Father   . Heart attack Paternal Grandfather     Social History:   Social History   Socioeconomic History  . Marital status: Married    Spouse name: Not on file  . Number of children: 0  . Years of education: Veterinary surgeon  . Highest education level: Not on file  Social Needs  . Financial resource strain: Not on file  . Food insecurity - worry: Not on file  . Food insecurity - inability: Not on file  . Transportation needs - medical: Not on file  . Transportation needs - non-medical: Not on file  Occupational History  . Occupation: Scientist, research (physical sciences)  Tobacco Use  . Smoking status: Never Smoker  . Smokeless tobacco: Never Used  Substance and Sexual Activity  . Alcohol use: No    Alcohol/week: 0.0 oz    Comment: Less than one drink per week.  . Drug use: No  . Sexual activity: Not Currently  Other Topics Concern  . Not on file  Social History Narrative  Lives at home with wife.   Right-handed.   3 cups caffeine per day.    Additional Social History:   Allergies:  No Known Allergies  Metabolic Disorder Labs: No results found for: HGBA1C, MPG Lab Results  Component Value Date   PROLACTIN  11/28/2007    10.1 (NOTE)     Reference Ranges:                 Male:                       2.1 -  17.1 ng/ml                 Male:   Pregnant          9.7 - 208.5 ng/mL                           Non Pregnant      2.8 -  29.2 ng/mL                           Post  Menopausal   1.8 -  20.3 ng/mL                     Lab Results  Component Value Date   CHOL 197 11/03/2007   TRIG 119 11/03/2007   HDL 37.8 (L) 11/03/2007   CHOLHDL 5.2 CALC 11/03/2007    VLDL 24 11/03/2007   LDLCALC 135 (H) 11/03/2007     Current Medications: Current Outpatient Medications  Medication Sig Dispense Refill  . B Complex Vitamins (VITAMIN B COMPLEX PO) Take by mouth.    Marland Kitchen buPROPion (WELLBUTRIN XL) 150 MG 24 hr tablet 3  qam 90 tablet 3  . fish oil-omega-3 fatty acids 1000 MG capsule Take 6 g by mouth daily.     Marland Kitchen gabapentin (NEURONTIN) 100 MG capsule Take 1 capsule (100 mg total) by mouth 3 (three) times daily. 90 capsule 0  . L-Methylfolate-Algae (DEPLIN 15) 15-90.314 MG CAPS Take 15 mg by mouth every morning. 30 capsule 5  . Multiple Vitamin (MULTIVITAMIN) capsule Take 1 capsule by mouth daily.    . nefazodone (SERZONE) 200 MG tablet 1  qam 30 tablet 5  . ranitidine (ZANTAC) 150 MG capsule Take 150 mg by mouth as needed.     Marland Kitchen REXULTI 1 MG TABS Take 1 tablet (1 mg total) by mouth daily. 30 tablet 4   No current facility-administered medications for this visit.     Neurologic: Headache: No Seizure: No Paresthesias:No  Musculoskeletal: Strength & Muscle Tone: within normal limits Gait & Station: normal Patient leans: N/A  Psychiatric Specialty Exam: ROS  There were no vitals taken for this visit.There is no height or weight on file to calculate BMI.  General Appearance: Casual  Eye Contact:  Good  Speech:  Clear and Coherent  Volume:  Normal  Mood:  Depressed  Affect:  Appropriate  Thought Process:Normal   Orientation:  Full (Time, Place, and Person)  Thought Content:  Logical  Suicidal Thoughts:  No  Homicidal Thoughts:  No  Memory:  NA  Judgement:  Good  Insight:  Good  Psychomotor Activity:  Concentration:    Recall:  Good  Fund of Knowledge:Good  Language: Good  Akathisia:  No  Handed: Right   AIMS (if indicated):    Assets:  Desire for Improvement  ADL's:  Intact  Cognition: WNL  Sleep:      Treatment Plan Summary:   At this time we began shared with him the possibility that he's got maximum benefitin a close  evaluation is a chronic depression state. He has classic dysthymic disorder. But in reality a close look demonstrates little evidence of education. Generally he is sleeping and eating well. He is not energy to get up get out of bed works 40 hours. His wife has no complaints. They have 1 son is doing well and otherwise nontender a lot of stress. I think over Route this patient. Nonetheless we'll continue his Deplin 15 mg maximum dose Wellbutrin 450 mg and increase his Serzone to a dose of 200 mg. He'll return to see me in 2 months for a 30 minute visit. The patient is generally stable.

## 2017-09-29 ENCOUNTER — Other Ambulatory Visit (HOSPITAL_COMMUNITY): Payer: Self-pay

## 2017-09-29 DIAGNOSIS — F320 Major depressive disorder, single episode, mild: Secondary | ICD-10-CM

## 2017-09-29 MED ORDER — GABAPENTIN 100 MG PO CAPS
100.0000 mg | ORAL_CAPSULE | Freq: Three times a day (TID) | ORAL | 0 refills | Status: DC
Start: 2017-09-29 — End: 2017-11-26

## 2017-11-14 ENCOUNTER — Ambulatory Visit (HOSPITAL_COMMUNITY): Payer: Self-pay | Admitting: Psychiatry

## 2017-11-14 DIAGNOSIS — M5134 Other intervertebral disc degeneration, thoracic region: Secondary | ICD-10-CM | POA: Diagnosis not present

## 2017-11-14 DIAGNOSIS — M5032 Other cervical disc degeneration, mid-cervical region, unspecified level: Secondary | ICD-10-CM | POA: Diagnosis not present

## 2017-11-14 DIAGNOSIS — M9901 Segmental and somatic dysfunction of cervical region: Secondary | ICD-10-CM | POA: Diagnosis not present

## 2017-11-17 ENCOUNTER — Emergency Department (HOSPITAL_COMMUNITY)
Admission: EM | Admit: 2017-11-17 | Discharge: 2017-11-17 | Disposition: A | Discharge status: Home / Self Care | Payer: 59 | Attending: Emergency Medicine | Admitting: Emergency Medicine

## 2017-11-17 ENCOUNTER — Emergency Department (HOSPITAL_COMMUNITY): Payer: 59

## 2017-11-17 ENCOUNTER — Encounter (HOSPITAL_COMMUNITY): Payer: Self-pay

## 2017-11-17 DIAGNOSIS — R0789 Other chest pain: Secondary | ICD-10-CM | POA: Diagnosis not present

## 2017-11-17 DIAGNOSIS — I714 Abdominal aortic aneurysm, without rupture: Secondary | ICD-10-CM | POA: Diagnosis not present

## 2017-11-17 DIAGNOSIS — I7779 Dissection of other artery: Secondary | ICD-10-CM | POA: Diagnosis not present

## 2017-11-17 DIAGNOSIS — R079 Chest pain, unspecified: Secondary | ICD-10-CM | POA: Diagnosis not present

## 2017-11-17 DIAGNOSIS — Z79899 Other long term (current) drug therapy: Secondary | ICD-10-CM | POA: Insufficient documentation

## 2017-11-17 LAB — BASIC METABOLIC PANEL
Anion gap: 12 (ref 5–15)
BUN: 9 mg/dL (ref 6–20)
CO2: 25 mmol/L (ref 22–32)
Calcium: 9.3 mg/dL (ref 8.9–10.3)
Chloride: 104 mmol/L (ref 101–111)
Creatinine, Ser: 1 mg/dL (ref 0.61–1.24)
GFR calc Af Amer: >60 mL/min (ref >60)
GFR calc non Af Amer: >60 mL/min (ref >60)
Glucose, Bld: 116 mg/dL — ABNORMAL HIGH (ref 65–99)
Potassium: 3.9 mmol/L (ref 3.5–5.1)
Sodium: 141 mmol/L (ref 135–145)

## 2017-11-17 LAB — I-STAT TROPONIN, ED
Troponin i, poc: 0 ng/mL (ref 0.00–0.08)
Troponin i, poc: 0 ng/mL (ref 0.00–0.08)

## 2017-11-17 LAB — CBC
HCT: 45.4 % (ref 39.0–52.0)
Hemoglobin: 14.9 g/dL (ref 13.0–17.0)
MCH: 30.1 pg (ref 26.0–34.0)
MCHC: 32.8 g/dL (ref 30.0–36.0)
MCV: 91.7 fL (ref 78.0–100.0)
Platelets: 219 10*3/uL (ref 150–400)
RBC: 4.95 MIL/uL (ref 4.22–5.81)
RDW: 14 % (ref 11.5–15.5)
WBC: 5.7 10*3/uL (ref 4.0–10.5)

## 2017-11-17 MED ORDER — IOPAMIDOL (ISOVUE-370) INJECTION 76%
INTRAVENOUS | Status: AC
  Administered 2017-11-17: 100 mL
  Filled 2017-11-17: qty 100

## 2017-11-17 NOTE — ED Triage Notes (Signed)
Patient complains of CP intermittently x 3 weeks with weakness and dizziness. Reports radiation to back.

## 2017-11-17 NOTE — Discharge Instructions (Signed)
Try zantac 150mg twice a day.  ° ° °

## 2017-11-17 NOTE — ED Provider Notes (Signed)
[facility] EMERGENCY DEPARTMENT Provider Note   CSN: [id number] Arrival date & time: 11/17/17  1004     History   Chief Complaint Chief Complaint  Patient presents with  . Chest Pain    HPI George Proctor is a 62 y.o. male.  62 yo M with a chief complaint of chest pain.  This been going on for the past week or so.  Patient describes it as a sharp pain lasts for short period of time.  Has some mild shortness of breath with it.  Sometimes he feels like his heart is beating more heavy than normal.  Has some radiation up to his neck and his back.  Usually gets worse throughout the day.  Nothing seems to make this better.  Nothing he does seems to make it worse.  Sometimes he has shortness of breath with exertion but denies it worsening the pain.  Denies lower extremity edema.  Denies hemoptysis.  Denies prior history of PE or DVT.  Denies history of hypertension hyperlipidemia or diabetes.  No family history of MI.  Denies smoking history.  He denies prolonged travel denies recent surgery denies recent hospitalization.  Denies testosterone use.  He has a history of an aortic root dilation.  His mom died of the aortic dissection.  He called his family physician who told him he needed to come to the ED to have this evaluated.   The history is provided by the patient.  Illness  This is a new problem. The current episode started more than 1 week ago. The problem occurs constantly. The problem has been resolved. Associated symptoms include chest pain and shortness of breath. Pertinent negatives include no abdominal pain and no headaches. Nothing aggravates the symptoms. Nothing relieves the symptoms. He has tried nothing for the symptoms. The treatment provided no relief.    Past Medical History:  Diagnosis Date  . Anxiety   . Depression   . Gait difficulty   . Memory loss     Patient Active Problem List   Diagnosis Date Noted  . Aortic valve regurgitation 03/04/2017  .  Depression 01/17/2017  . Plantar fasciitis 12/24/2013  . Achilles tendonitis 12/14/2013  . Pronation deformity of ankle, acquired 12/14/2013  . Pain in lower limb 12/14/2013  . SOB (shortness of breath) 10/30/2011  . Aortic root dilatation ([place]) 10/30/2011  . Abnormal stress test 10/22/2011  . Chest pain 10/22/2011  . TESTOSTERONE DEFICIENCY 04/19/2008  . ANXIETY 04/28/2007  . INSOMNIA, PERSISTENT 04/28/2007  . ADHD 04/28/2007  . GERD 04/28/2007    Past Surgical History:  Procedure Laterality Date  . ABDOMINAL SURGERY     Childhood  . BUNIONECTOMY         Home Medications    Prior to Admission medications   Medication Sig Start Date End Date Taking? Authorizing Provider  B Complex Vitamins (VITAMIN B COMPLEX PO) Take by mouth.    [provider]  buPROPion (WELLBUTRIN XL) 150 MG 24 hr tablet 3  qam 07/16/17   Plovsky, Berneta Sages, MD  fish oil-omega-3 fatty acids 1000 MG capsule Take 6 g by mouth daily.     [provider]  gabapentin (NEURONTIN) 100 MG capsule Take 1 capsule (100 mg total) by mouth 3 (three) times daily. 09/29/17   Plovsky, Berneta Sages, MD  L-Methylfolate-Algae (DEPLIN 15) 15-90.314 MG CAPS Take 15 mg by mouth every morning. 07/16/17   Plovsky, Berneta Sages, MD  Multiple Vitamin (MULTIVITAMIN) capsule Take 1 capsule by mouth daily.  [provider]  nefazodone (SERZONE) 200 MG tablet 1  qam 09/05/17   Plovsky, Berneta Sages, MD  ranitidine (ZANTAC) 150 MG capsule Take 150 mg by mouth as needed.     [provider]  REXULTI 1 MG TABS Take 1 tablet (1 mg total) by mouth daily. 05/28/17   Norma Fredrickson, MD    Family History Family History  Problem Relation Age of Onset  . Aneurysm Mother 41       Thoracic  . Hypertension Father   . Heart attack Paternal Grandfather     Social History Social History   Tobacco Use  . Smoking status: Never Smoker  . Smokeless tobacco: Never Used  Substance Use Topics  . Alcohol use: No     Alcohol/week: 0.0 oz    Comment: Less than one drink per week.  . Drug use: No     Allergies   Patient has no known allergies.   Review of Systems Review of Systems  Constitutional: Negative for chills and fever.  HENT: Negative for congestion and facial swelling.   Eyes: Negative for discharge and visual disturbance.  Respiratory: Positive for shortness of breath.   Cardiovascular: Positive for chest pain. Negative for palpitations.  Gastrointestinal: Negative for abdominal pain, diarrhea and vomiting.  Musculoskeletal: Negative for arthralgias and myalgias.  Skin: Negative for color change and rash.  Neurological: Negative for tremors, syncope and headaches.  Psychiatric/Behavioral: Negative for confusion and dysphoric mood.     Physical Exam Updated Vital Signs BP (!) 123/91 (BP Location: Right Arm)   Pulse 84   Temp 98.3 F (36.8 C) (Oral)   Resp 16   SpO2 98%   Physical Exam  Constitutional: He is oriented to person, place, and time. He appears well-developed and well-nourished.  HENT:  Head: Normocephalic and atraumatic.  Eyes: EOM are normal. Pupils are equal, round, and reactive to light.  Neck: Normal range of motion. Neck supple. No JVD present.  Cardiovascular: Normal rate and regular rhythm. Exam reveals no gallop and no friction rub.  No murmur heard. Pulses:      Radial pulses are 2+ on the right side, and 2+ on the left side.       Dorsalis pedis pulses are 2+ on the right side, and 2+ on the left side.  Pulmonary/Chest: No respiratory distress. He has no wheezes.  Abdominal: He exhibits no distension. There is no rebound and no guarding.  Musculoskeletal: Normal range of motion.  Neurological: He is alert and oriented to person, place, and time.  Skin: No rash noted. No pallor.  Psychiatric: He has a normal mood and affect. His behavior is normal.  Nursing note and vitals reviewed.    ED Treatments / Results  Labs (all labs ordered are listed,  but only abnormal results are displayed) Labs Reviewed  BASIC METABOLIC PANEL - Abnormal; Notable for the following components:      Result Value   Glucose, Bld 116 (*)    All other components within normal limits  CBC  I-STAT TROPONIN, ED  I-STAT TROPONIN, ED    EKG  EKG Interpretation  Date/Time:  Monday November 17 2017 10:14:52 EST Ventricular Rate:  93 PR Interval:  166 QRS Duration: 98 QT Interval:  370 QTC Calculation: 460 R Axis:   84 Text Interpretation:  Normal sinus rhythm Possible Anterior infarct , age undetermined Abnormal ECG No significant change since last tracing Confirmed by Deno Etienne [phone]) on 11/17/2017 4:00:42 PM  Radiology Dg Chest 2 View  Result Date: 11/17/2017 CLINICAL DATA:  Chest pain EXAM: CHEST  2 VIEW COMPARISON:  None. FINDINGS: Heart and mediastinal contours are within normal limits. No focal opacities or effusions. No acute bony abnormality. IMPRESSION: No active cardiopulmonary disease. Electronically Signed   By: Rolm Baptise M.D.   On: 11/17/2017 10:58   Ct Angio Chest/abd/pel For Dissection W And/or Wo Contrast  Result Date: 11/17/2017 CLINICAL DATA:  Chest pain EXAM: CT ANGIOGRAPHY CHEST, ABDOMEN AND PELVIS TECHNIQUE: Multidetector CT imaging through the chest, abdomen and pelvis was performed using the standard protocol during bolus administration of intravenous contrast. Multiplanar reconstructed images and MIPs were obtained and reviewed to evaluate the vascular anatomy. CONTRAST:  167mL ISOVUE-370 IOPAMIDOL (ISOVUE-370) INJECTION 76% COMPARISON:  Chest two-view 11/17/2017, MRI chest 10/18/2013 FINDINGS: CTA CHEST FINDINGS Cardiovascular: Heart size normal. No pericardial effusion. No coronary calcification. Negative for pulmonary embolism. Dilatation of the aortic root measuring 42 mm unchanged from the prior MRI. No dissection in the thoracic aorta. Left vertebral artery origin from the aortic arch. Mediastinum/Nodes: Negative Lungs/Pleura:  Negative Musculoskeletal: Negative Review of the MIP images confirms the above findings. CTA ABDOMEN AND PELVIS FINDINGS VASCULAR Aorta: Mild atherosclerotic disease abdominal aorta without aneurysm. Atherosclerotic disease in the iliac arteries bilaterally. Aneurysmal dilatation right iliac artery 18 mm. Left iliac artery 12 mm diameter. Celiac: Widely patent without abnormality SMA: Short segment dissection of the SMA approximately 3 cm after the origin. Mild dilatation of the SMA in this area. No thrombus or branch occlusion in the SMA. Renals: Single renal artery bilaterally widely patent IMA: Patent Inflow: Normal Veins: Negative Review of the MIP images confirms the above findings. NON-VASCULAR Hepatobiliary: Liver gallbladder and bile ducts normal Pancreas: Negative Spleen: Negative Adrenals/Urinary Tract: Normal kidneys without obstruction or mass. Stomach/Bowel: Negative for bowel obstruction or mass. No bowel edema or evidence of ischemia. Normal appendix Lymphatic: Negative Reproductive: Mild prostate enlargement Other: None Musculoskeletal: Disc degeneration with disc space narrowing L5-S1. No acute skeletal abnormality. Review of the MIP images confirms the above findings. IMPRESSION: Chronic dilatation of the aortic root 4.2 cm, unchanged from 2015. 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 No thrombus or branch occlusion in the SMA. No evidence of bowel edema. This dissection could be acute or chronic. Aneurysmal dilatation right iliac artery 18 mm. Left iliac artery 12 mm. These results were called by telephone at the time of interpretation on 11/17/2017 at 6:43 pm to Dr. Deno Etienne , who verbally acknowledged these results. Electronically Signed   By: Franchot Gallo M.D.   On: 11/17/2017 18:45    Procedures Procedures (including critical care time)  Medications Ordered in ED Medications  iopamidol (ISOVUE-370) 76 % injection  (100 mLs  Contrast Given 11/17/17 1722)     Initial Impression / Assessment and Plan / ED Course  I have reviewed the triage vital signs and the nursing notes.  Pertinent labs & imaging results that were available during my care of the patient were reviewed by me and considered in my medical decision making (see chart for details).     62 yo M with a chief complaint of chest pain.  This is atypical in nature.  He currently has no pain.  Resolved a couple days ago.  Initial troponin is negative.  EKG is unremarkable.  Patient is concerned about an aortic dissection as discussed the only way to diagnose this in the ED would be through  CT scanning.  The patient is concerned enough that he feels that that would be warranted.  CTA concerning for SMA dissection though there is a question of how acute this is on CT.  Will discuss with vascular.  I discussed the case with Dr. Oneida Alar.  He thinks that this is most likely a chronic finding.  Recommends follow-up as an outpatient.  He will need serial imaging every 6 months.  His office will contact the patient tomorrow to schedule an appointment in 2 weeks time.  I discussed this with the patient.  He continues to have no abdominal tenderness.  I will start him on Zantac for his atypical chest pain.  Discharge home.  7:46 PM:  I have discussed the diagnosis/risks/treatment options with the patient and believe the pt to be eligible for discharge home to follow-up with PCP. We also discussed returning to the ED immediately if new or worsening sx occur. We discussed the sx which are most concerning (e.g., sudden worsening pain, fever, inability to tolerate by mouth) that necessitate immediate return. Medications administered to the patient during their visit and any new prescriptions provided to the patient are listed below.  Medications given during this visit Medications  iopamidol (ISOVUE-370) 76 % injection (100 mLs  Contrast Given 11/17/17 1722)     The  patient appears reasonably screen and/or stabilized for discharge and I doubt any other medical condition or other [facility] requiring further screening, evaluation, or treatment in the ED at this time prior to discharge.    Final Clinical Impressions(s) / ED Diagnoses   Final diagnoses:  Dissection of mesenteric artery [facility])    ED Discharge Orders    None       Deno Etienne, DO 11/17/17 1946

## 2017-11-18 ENCOUNTER — Telehealth: Payer: Self-pay | Admitting: Vascular Surgery

## 2017-11-18 DIAGNOSIS — M9901 Segmental and somatic dysfunction of cervical region: Secondary | ICD-10-CM | POA: Diagnosis not present

## 2017-11-18 DIAGNOSIS — M5134 Other intervertebral disc degeneration, thoracic region: Secondary | ICD-10-CM | POA: Diagnosis not present

## 2017-11-18 DIAGNOSIS — M5032 Other cervical disc degeneration, mid-cervical region, unspecified level: Secondary | ICD-10-CM | POA: Diagnosis not present

## 2017-11-18 NOTE — Telephone Encounter (Signed)
Sched appt 12/23/17 at 11:00. Lm on pt's cell# to inform pt of appt.

## 2017-11-18 NOTE — Telephone Encounter (Signed)
-----   Message from Mena Goes, RN sent at 11/18/2017  8:48 AM EST ----- Regarding: OV with Dr. Donnetta Hutching   ----- Message ----- From: Rosetta Posner, MD Sent: 11/17/2017   7:04 PM To: [facility], [facility]  Needs ov with me in the next several weeks.  CT shows asymptomatic SMA dissection

## 2017-11-21 DIAGNOSIS — M5134 Other intervertebral disc degeneration, thoracic region: Secondary | ICD-10-CM | POA: Diagnosis not present

## 2017-11-21 DIAGNOSIS — M9901 Segmental and somatic dysfunction of cervical region: Secondary | ICD-10-CM | POA: Diagnosis not present

## 2017-11-21 DIAGNOSIS — M5032 Other cervical disc degeneration, mid-cervical region, unspecified level: Secondary | ICD-10-CM | POA: Diagnosis not present

## 2017-11-25 DIAGNOSIS — M5134 Other intervertebral disc degeneration, thoracic region: Secondary | ICD-10-CM | POA: Diagnosis not present

## 2017-11-25 DIAGNOSIS — M9901 Segmental and somatic dysfunction of cervical region: Secondary | ICD-10-CM | POA: Diagnosis not present

## 2017-11-25 DIAGNOSIS — M5032 Other cervical disc degeneration, mid-cervical region, unspecified level: Secondary | ICD-10-CM | POA: Diagnosis not present

## 2017-11-26 ENCOUNTER — Encounter (HOSPITAL_COMMUNITY): Payer: Self-pay | Admitting: Psychiatry

## 2017-11-26 ENCOUNTER — Ambulatory Visit (INDEPENDENT_AMBULATORY_CARE_PROVIDER_SITE_OTHER): Payer: 59 | Admitting: Psychiatry

## 2017-11-26 VITALS — BP 136/78 | HR 99 | Ht 72.0 in | Wt 178.0 lb

## 2017-11-26 DIAGNOSIS — F419 Anxiety disorder, unspecified: Secondary | ICD-10-CM

## 2017-11-26 DIAGNOSIS — F320 Major depressive disorder, single episode, mild: Secondary | ICD-10-CM

## 2017-11-26 DIAGNOSIS — Z915 Personal history of self-harm: Secondary | ICD-10-CM | POA: Diagnosis not present

## 2017-11-26 DIAGNOSIS — F341 Dysthymic disorder: Secondary | ICD-10-CM

## 2017-11-26 MED ORDER — GABAPENTIN 100 MG PO CAPS: 100.0000 mg | ORAL_CAPSULE | Freq: Three times a day (TID) | ORAL | 0 refills | Status: DC

## 2017-11-26 NOTE — Progress Notes (Signed)
Psychiatric Initial Adult Assessment   Patient Identification: George Proctor MRN:  [id number] Date of Evaluation:  11/26/2017 Referral Source: Dr. Gershon Mussel Heeding Chief Complaint:    Visit Diagnosis:    ICD-10-CM   1. Current mild episode of major depressive disorder, unspecified whether recurrent ([place]) F32.0     History of Present Illness  Today the patient is seen alone. His wife could not get off work. The patient did try to increase his Serzone to the full dose of 200 mg but had side effects. Todayhis past psychotropic medication treatment. He's tried multiple SSRIs, SSRIs including Fetzima, Effexor. He's tried tricyclic antidepressants Wellbutrin and has had a course of TMS all of which failed. He's been on MAO inhibitors which failed. He's been on lithium in the past which failed.He's been on Remeron in the past which failed. He's taken a number of adjunct antidepressants such as Abilify and Rexulti which she could not tolerate her failed.A close evaluation the patient actually sleeping fairly well. I believe his appetite is good. He describes a lower energy level but it should be getting worse 40 hours a week. He is functioning fairly well at work. Is not very enthusiastic about. Is not suicidal. He denies being worthless. Doesn't really describe psychomotor retardation. The patient does not exercise, socialize very much review anything for crater purposes. He does not do very much better. He believes is very side sedentary and his work life. I shared with him the possibility that he is one of his individuals simply does not respond medications he presently is taking 450 mg of Wellbutrin. At this time our interventions will be for him to reduce the Wellbutrin she is taking 1 in the morning for 3 days and then to discontinue it. I shared with him that there is no withdrawal. Ice come back in 7 weeks we'll reevalu depression. I do not think he is a candidate for ECT. Technically this time he barely  meets the criteria of major clinical depression but is consistent with persistent dysthymic disorder.    Past Psychiatric History: Seen psychiatrists 2 times been psychiatrically hospitalized 2 times in his made a suicide attempt in the past. Previous Psychotropic Medications: Yes   Substance Abuse History in the last 12 months:  No.  Consequences of Substance Abuse: NA  Past Medical History:  Past Medical History:  Diagnosis Date  . Anxiety   . Depression   . Gait difficulty   . Memory loss     Past Surgical History:  Procedure Laterality Date  . ABDOMINAL SURGERY     Childhood  . BUNIONECTOMY      Family Psychiatric History:   Family History:  Family History  Problem Relation Age of Onset  . Aneurysm Mother 32       Thoracic  . Hypertension Father   . Heart attack Paternal Grandfather     Social History:   Social History   Socioeconomic History  . Marital status: Married    Spouse name: None  . Number of children: 0  . Years of education: Veterinary surgeon  . Highest education level: None  Social Needs  . Financial resource strain: None  . Food insecurity - worry: None  . Food insecurity - inability: None  . Transportation needs - medical: None  . Transportation needs - non-medical: None  Occupational History  . Occupation: Scientist, research (physical sciences)  Tobacco Use  . Smoking status: Never Smoker  . Smokeless tobacco: Never Used  Substance and Sexual  Activity  . Alcohol use: No    Alcohol/week: 0.0 oz    Comment: Less than one drink per week.  . Drug use: No  . Sexual activity: Not Currently  Other Topics Concern  . None  Social History Narrative   Lives at home with wife.   Right-handed.   3 cups caffeine per day.    Additional Social History:   Allergies:  No Known Allergies  Metabolic Disorder Labs: No results found for: HGBA1C, MPG Lab Results  Component Value Date   PROLACTIN  11/28/2007    10.1 (NOTE)     Reference Ranges:                  Male:                       2.1 -  17.1 ng/ml                 Male:   Pregnant          9.7 - 208.5 ng/mL                           Non Pregnant      2.8 -  29.2 ng/mL                           Post  Menopausal   1.8 -  20.3 ng/mL                     Lab Results  Component Value Date   CHOL 197 11/03/2007   TRIG 119 11/03/2007   HDL 37.8 (L) 11/03/2007   CHOLHDL 5.2 CALC 11/03/2007   VLDL 24 11/03/2007   LDLCALC 135 (H) 11/03/2007     Current Medications: Current Outpatient Medications  Medication Sig Dispense Refill  . buPROPion (WELLBUTRIN XL) 150 MG 24 hr tablet 3  qam 90 tablet 3  . fish oil-omega-3 fatty acids 1000 MG capsule Take 6 g by mouth daily.     Marland Kitchen gabapentin (NEURONTIN) 100 MG capsule Take 1 capsule (100 mg total) by mouth 3 (three) times daily. 90 capsule 0  . Multiple Vitamin (MULTIVITAMIN) capsule Take 1 capsule by mouth daily.    . ranitidine (ZANTAC) 150 MG capsule Take 150 mg by mouth as needed.     . B Complex Vitamins (VITAMIN B COMPLEX PO) Take by mouth.    Marland Kitchen L-Methylfolate-Algae (DEPLIN 15) 15-90.314 MG CAPS Take 15 mg by mouth every morning. (Patient not taking: Reported on 11/26/2017) 30 capsule 5  . nefazodone (SERZONE) 200 MG tablet 1  qam (Patient not taking: Reported on 11/26/2017) 30 tablet 5  . REXULTI 1 MG TABS Take 1 tablet (1 mg total) by mouth daily. (Patient not taking: Reported on 11/26/2017) 30 tablet 4   No current facility-administered medications for this visit.     Neurologic: Headache: No Seizure: No Paresthesias:No  Musculoskeletal: Strength & Muscle Tone: within normal limits Gait & Station: normal Patient leans: N/A  Psychiatric Specialty Exam: ROS  Blood pressure 136/78, pulse 99, height 6' (1.829 m), weight 178 lb (80.7 kg), SpO2 98 %.Body mass index is 24.14 kg/m.  General Appearance: Casual  Eye Contact:  Good  Speech:  Clear and Coherent  Volume:  Normal  Mood:  Depressed  Affect:  Appropriate  Thought  Process:Normal   Orientation:  Full (Time, Place,  and Person)  Thought Content:  Logical  Suicidal Thoughts:  No  Homicidal Thoughts:  No  Memory:  NA  Judgement:  Good  Insight:  Good  Psychomotor Activity:  Concentration:    Recall:  Good  Fund of Knowledge:Good  Language: Good  Akathisia:  No  Handed: Right   AIMS (if indicated):    Assets:  Desire for Improvement  ADL's:  Intact  Cognition: WNL  Sleep:      Treatment Plan Summary:   At this time the patient will reduce his Wellbutrin to just taking 150 mg in the morningfor 3 days and then to discontinue it. I asked him to stay in therapy which she sees Dr. Marylene Buerger every other week. Noted also is the patient failed stimulant as well. The patient for now continue Neurontin 100 mg 3 times a day as it does seem to help his anxiety according to him. I think it is anxiety that were sent had no problems increase it further at this time I think this patient is treatment resistant to medications. Actually this is not consistent with dysthymic disorder which often does not have response to antidepressants.

## 2017-11-28 DIAGNOSIS — M5134 Other intervertebral disc degeneration, thoracic region: Secondary | ICD-10-CM | POA: Diagnosis not present

## 2017-11-28 DIAGNOSIS — M9901 Segmental and somatic dysfunction of cervical region: Secondary | ICD-10-CM | POA: Diagnosis not present

## 2017-11-28 DIAGNOSIS — M5032 Other cervical disc degeneration, mid-cervical region, unspecified level: Secondary | ICD-10-CM | POA: Diagnosis not present

## 2017-12-02 DIAGNOSIS — M5032 Other cervical disc degeneration, mid-cervical region, unspecified level: Secondary | ICD-10-CM | POA: Diagnosis not present

## 2017-12-02 DIAGNOSIS — M9901 Segmental and somatic dysfunction of cervical region: Secondary | ICD-10-CM | POA: Diagnosis not present

## 2017-12-02 DIAGNOSIS — M5134 Other intervertebral disc degeneration, thoracic region: Secondary | ICD-10-CM | POA: Diagnosis not present

## 2017-12-08 DIAGNOSIS — M9901 Segmental and somatic dysfunction of cervical region: Secondary | ICD-10-CM | POA: Diagnosis not present

## 2017-12-08 DIAGNOSIS — M5032 Other cervical disc degeneration, mid-cervical region, unspecified level: Secondary | ICD-10-CM | POA: Diagnosis not present

## 2017-12-08 DIAGNOSIS — M5134 Other intervertebral disc degeneration, thoracic region: Secondary | ICD-10-CM | POA: Diagnosis not present

## 2017-12-19 DIAGNOSIS — M5134 Other intervertebral disc degeneration, thoracic region: Secondary | ICD-10-CM | POA: Diagnosis not present

## 2017-12-19 DIAGNOSIS — M9901 Segmental and somatic dysfunction of cervical region: Secondary | ICD-10-CM | POA: Diagnosis not present

## 2017-12-19 DIAGNOSIS — M5032 Other cervical disc degeneration, mid-cervical region, unspecified level: Secondary | ICD-10-CM | POA: Diagnosis not present

## 2017-12-23 ENCOUNTER — Other Ambulatory Visit: Payer: Self-pay

## 2017-12-23 ENCOUNTER — Encounter: Payer: Self-pay | Admitting: Vascular Surgery

## 2017-12-23 ENCOUNTER — Ambulatory Visit: Payer: 59 | Admitting: Vascular Surgery

## 2017-12-23 VITALS — BP 136/84 | HR 81 | Resp 20 | Ht 72.0 in | Wt 181.7 lb

## 2017-12-23 DIAGNOSIS — K551 Chronic vascular disorders of intestine: Secondary | ICD-10-CM

## 2017-12-23 NOTE — Progress Notes (Signed)
Vascular and Vein Specialist of [facility]  Patient name: George Proctor MRN: [id number] DOB: 04-25-1956 Sex: male  REASON FOR CONSULT: Discuss recent finding of superior mesenteric artery dissection  HPI: George Proctor is a 62 y.o. male, who is here today for discussion of CT scan from 1 month ago.  He presented with chest pain and had CT scan of his abdomen and chest and pelvis.  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  He is seen today for further discussion of this.  He does have a known history of dilated aortic root and CT scan from 1 month ago showed no change in this since the prior scan in 2015.  He reports that his symptoms of chest discomfort had resolved following his admission.  Does have a history of his mother dying from a thoracic aneurysm at age 60.  No other family history is with known aneurysm history.  The cardiac difficulties.  No history of note.  Does have a history of unsteady gait.  Past Medical History:  Diagnosis Date  . Anxiety   . Depression   . Gait difficulty   . Memory loss     Family History  Problem Relation Age of Onset  . Aneurysm Mother 42       Thoracic  . Hypertension Father   . Heart attack Paternal Grandfather     SOCIAL HISTORY: Social History   Socioeconomic History  . Marital status: Married    Spouse name: Not on file  . Number of children: 0  . Years of education: Veterinary surgeon  . Highest education level: Not on file  Occupational History  . Occupation: Scientist, research (physical sciences)  Social Needs  . Financial resource strain: Not on file  . Food insecurity:    Worry: Not on file    Inability: Not on file  . Transportation needs:    Medical: Not on file    Non-medical: Not on file  Tobacco Use  . Smoking status: Never Smoker  . Smokeless tobacco: Never Used  Substance and Sexual Activity  .  Alcohol use: No    Alcohol/week: 0.0 oz    Comment: Less than one drink per week.  . Drug use: No  . Sexual activity: Not Currently  Lifestyle  . Physical activity:    Days per week: Not on file    Minutes per session: Not on file  . Stress: Not on file  Relationships  . Social connections:    Talks on phone: Not on file    Gets together: Not on file    Attends religious service: Not on file    Active member of club or organization: Not on file    Attends meetings of clubs or organizations: Not on file    Relationship status: Not on file  . Intimate partner violence:    Fear of current or ex partner: Not on file    Emotionally abused: Not on file    Physically abused: Not on file    Forced sexual activity: Not on file  Other Topics Concern  . Not on file  Social History Narrative   Lives at home with wife.   Right-handed.   3 cups caffeine per day.    No Known Allergies  Current Outpatient Medications  Medication Sig Dispense Refill  . B Complex Vitamins (VITAMIN B COMPLEX PO) Take by mouth.    Marland Kitchen  fish oil-omega-3 fatty acids 1000 MG capsule Take 6 g by mouth daily.     Marland Kitchen gabapentin (NEURONTIN) 100 MG capsule Take 1 capsule (100 mg total) by mouth 3 (three) times daily. 90 capsule 0  . Multiple Vitamin (MULTIVITAMIN) capsule Take 1 capsule by mouth daily.    . ranitidine (ZANTAC) 150 MG capsule Take 150 mg by mouth as needed.     Marland Kitchen buPROPion (WELLBUTRIN XL) 150 MG 24 hr tablet 3  qam (Patient not taking: Reported on 12/23/2017) 90 tablet 3  . L-Methylfolate-Algae (DEPLIN 15) 15-90.314 MG CAPS Take 15 mg by mouth every morning. (Patient not taking: Reported on 11/26/2017) 30 capsule 5  . nefazodone (SERZONE) 200 MG tablet 1  qam (Patient not taking: Reported on 11/26/2017) 30 tablet 5  . REXULTI 1 MG TABS Take 1 tablet (1 mg total) by mouth daily. (Patient not taking: Reported on 11/26/2017) 30 tablet 4   No current facility-administered medications for this visit.     REVIEW  OF SYSTEMS:  [X]  denotes positive finding, [ ]  denotes negative finding Cardiac  Comments:  Chest pain or chest pressure: x   Shortness of breath upon exertion: x   Short of breath when lying flat:    Irregular heart rhythm: x       Vascular    Pain in calf, thigh, or hip brought on by ambulation:    Pain in feet at night that wakes you up from your sleep:     Blood clot in your veins:    Leg swelling:         Pulmonary    Oxygen at home:    Productive cough:     Wheezing:         Neurologic    Sudden weakness in arms or legs:     Sudden numbness in arms or legs:     Sudden onset of difficulty speaking or slurred speech:    Temporary loss of vision in one eye:     Problems with dizziness:  x       Gastrointestinal    Blood in stool:     Vomited blood:         Genitourinary    Burning when urinating:     Blood in urine:        Psychiatric    Major depression:  x       Hematologic    Bleeding problems:    Problems with blood clotting too easily:        Skin    Rashes or ulcers:        Constitutional    Fever or chills:      PHYSICAL EXAM: Vitals:   12/23/17 1116  BP: 136/84  Pulse: 81  Resp: 20  SpO2: 97%  Weight: 181 lb 11.2 oz (82.4 kg)  Height: 6' (1.829 m)    GENERAL: The patient is a well-nourished male, in no acute distress. The vital signs are documented above. CARDIOVASCULAR: 2+ radial pulses bilaterally.  3+ femoral popliteal and dorsalis pedis pulses. PULMONARY: There is good air exchange  ABDOMEN: Soft and non-tender.  No bruits noted MUSCULOSKELETAL: There are no major deformities or cyanosis. NEUROLOGIC: No focal weakness or paresthesias are detected. SKIN: There are no ulcers or rashes noted. PSYCHIATRIC: The patient has a normal affect.  DATA:  I reviewed his CT scan images and reviewed the images with the patient.  Showed his dilated aortic root.  Also showed his mid SMA  dissection.  Also discussed his bilateral common iliac artery  aneurysms right greater than left.  MEDICAL ISSUES: Asymptomatic incidental findings on CT scan of SMA dissection and iliac aneurysms.  Have recommended that we see him again in 1 year with CT scan of his chest abdomen pelvis and runoff for full evaluation evaluation.  He does appear to have diffuse arteriomegaly and explained that this would put him at increased risk for developing aneurysms.  Reviewed symptoms of mesenteric ischemia and aneurysm rupture and he knows to present immediately to the emergency room should this occur   Rosetta Posner, MD [facility] Vascular and Vein Specialists of [facility] Tel [phone] Pager [phone]

## 2017-12-24 ENCOUNTER — Other Ambulatory Visit: Payer: Self-pay

## 2017-12-26 DIAGNOSIS — M5134 Other intervertebral disc degeneration, thoracic region: Secondary | ICD-10-CM | POA: Diagnosis not present

## 2017-12-26 DIAGNOSIS — M5032 Other cervical disc degeneration, mid-cervical region, unspecified level: Secondary | ICD-10-CM | POA: Diagnosis not present

## 2017-12-26 DIAGNOSIS — M9901 Segmental and somatic dysfunction of cervical region: Secondary | ICD-10-CM | POA: Diagnosis not present

## 2018-01-02 DIAGNOSIS — M9901 Segmental and somatic dysfunction of cervical region: Secondary | ICD-10-CM | POA: Diagnosis not present

## 2018-01-02 DIAGNOSIS — M5134 Other intervertebral disc degeneration, thoracic region: Secondary | ICD-10-CM | POA: Diagnosis not present

## 2018-01-02 DIAGNOSIS — M5032 Other cervical disc degeneration, mid-cervical region, unspecified level: Secondary | ICD-10-CM | POA: Diagnosis not present

## 2018-01-09 DIAGNOSIS — M5032 Other cervical disc degeneration, mid-cervical region, unspecified level: Secondary | ICD-10-CM | POA: Diagnosis not present

## 2018-01-09 DIAGNOSIS — M5134 Other intervertebral disc degeneration, thoracic region: Secondary | ICD-10-CM | POA: Diagnosis not present

## 2018-01-09 DIAGNOSIS — M9901 Segmental and somatic dysfunction of cervical region: Secondary | ICD-10-CM | POA: Diagnosis not present

## 2018-01-14 ENCOUNTER — Encounter (HOSPITAL_COMMUNITY): Payer: Self-pay | Admitting: Psychiatry

## 2018-01-14 ENCOUNTER — Ambulatory Visit (INDEPENDENT_AMBULATORY_CARE_PROVIDER_SITE_OTHER): Payer: 59 | Admitting: Psychiatry

## 2018-01-14 VITALS — BP 110/75 | HR 75 | Ht 72.0 in | Wt 182.0 lb

## 2018-01-14 DIAGNOSIS — F329 Major depressive disorder, single episode, unspecified: Secondary | ICD-10-CM | POA: Diagnosis not present

## 2018-01-14 MED ORDER — BUPROPION HCL ER (XL) 150 MG PO TB24: ORAL_TABLET | ORAL | 4 refills | Status: DC

## 2018-01-14 NOTE — Progress Notes (Signed)
Psychiatric Initial Adult Assessment   Patient Identification: George Proctor MRN:  [id number] Date of Evaluation:  01/14/2018 Referral Source: Dr. Gershon Mussel Heeding Chief Complaint:    Visit Diagnosis:  No diagnosis found.  History of Present Illness Although is difficult to evaluate the patient can say that since being off the Wellbutrin does feel somewhat different. He seems to be less motivated has less energy and he is concentrating less well the patient complains mainly of memory problems. He describes the long-term and somewhat short-term memory problems. This is the first time he complained of this. Besides memory problems his energy level he claims very low despite the fact that he works 40 hours. The patient will resume discontinue his Neurontin suspect cannot be a cause. It should be noted that he does get a ride because of anxiety and now without any Neurontin he doesn't feel any more anxious. The patient is eating fairly well. He states active. Is lots has to do. He seems chronically anhedonic. He wants to retire doesn't know what to do when he retires. Continue seeing his therapist regularly.   Past Psychiatric History: Seen psychiatrists 2 times been psychiatrically hospitalized 2 times in his made a suicide attempt in the past. Previous Psychotropic Medications: Yes   Substance Abuse History in the last 12 months:  No.  Consequences of Substance Abuse: NA  Past Medical History:  Past Medical History:  Diagnosis Date  . Anxiety   . Depression   . Gait difficulty   . Memory loss     Past Surgical History:  Procedure Laterality Date  . ABDOMINAL SURGERY     Childhood  . BUNIONECTOMY      Family Psychiatric History:   Family History:  Family History  Problem Relation Age of Onset  . Aneurysm Mother 53       Thoracic  . Hypertension Father   . Heart attack Paternal Grandfather     Social History:   Social History   Socioeconomic History  . Marital status:  Married    Spouse name: Not on file  . Number of children: 0  . Years of education: Veterinary surgeon  . Highest education level: Not on file  Occupational History  . Occupation: Scientist, research (physical sciences)  Social Needs  . Financial resource strain: Not on file  . Food insecurity:    Worry: Not on file    Inability: Not on file  . Transportation needs:    Medical: Not on file    Non-medical: Not on file  Tobacco Use  . Smoking status: Never Smoker  . Smokeless tobacco: Never Used  Substance and Sexual Activity  . Alcohol use: No    Alcohol/week: 0.0 oz    Comment: Less than one drink per week.  . Drug use: No  . Sexual activity: Not Currently  Lifestyle  . Physical activity:    Days per week: Not on file    Minutes per session: Not on file  . Stress: Not on file  Relationships  . Social connections:    Talks on phone: Not on file    Gets together: Not on file    Attends religious service: Not on file    Active member of club or organization: Not on file    Attends meetings of clubs or organizations: Not on file    Relationship status: Not on file  Other Topics Concern  . Not on file  Social History Narrative   Lives at home with wife.  Right-handed.   3 cups caffeine per day.    Additional Social History:   Allergies:  No Known Allergies  Metabolic Disorder Labs: No results found for: HGBA1C, MPG Lab Results  Component Value Date   PROLACTIN  11/28/2007    10.1 (NOTE)     Reference Ranges:                 Male:                       2.1 -  17.1 ng/ml                 Male:   Pregnant          9.7 - 208.5 ng/mL                           Non Pregnant      2.8 -  29.2 ng/mL                           Post  Menopausal   1.8 -  20.3 ng/mL                     Lab Results  Component Value Date   CHOL 197 11/03/2007   TRIG 119 11/03/2007   HDL 37.8 (L) 11/03/2007   CHOLHDL 5.2 CALC 11/03/2007   VLDL 24 11/03/2007   LDLCALC 135 (H) 11/03/2007     Current  Medications: Current Outpatient Medications  Medication Sig Dispense Refill  . B Complex Vitamins (VITAMIN B COMPLEX PO) Take by mouth.    . fish oil-omega-3 fatty acids 1000 MG capsule Take 6 g by mouth daily.     . Multiple Vitamin (MULTIVITAMIN) capsule Take 1 capsule by mouth daily.    . ranitidine (ZANTAC) 150 MG capsule Take 150 mg by mouth as needed.     Marland Kitchen buPROPion (WELLBUTRIN XL) 150 MG 24 hr tablet 1  qam  For  6 days  Then  2   qam  For 7 days yhe 3 qam 90 tablet 4   No current facility-administered medications for this visit.     Neurologic: Headache: No Seizure: No Paresthesias:No  Musculoskeletal: Strength & Muscle Tone: within normal limits Gait & Station: normal Patient leans: N/A  Psychiatric Specialty Exam: ROS  Blood pressure 110/75, pulse 75, height 6' (1.829 m), weight 182 lb (82.6 kg), SpO2 96 %.Body mass index is 24.68 kg/m.  General Appearance: Casual  Eye Contact:  Good  Speech:  Clear and Coherent  Volume:  Normal  Mood:  Depressed  Affect:  Appropriate  Thought Process:Normal   Orientation:  Full (Time, Place, and Person)  Thought Content:  Logical  Suicidal Thoughts:  No  Homicidal Thoughts:  No  Memory:  NA  Judgement:  Good  Insight:  Good  Psychomotor Activity:  Concentration:    Recall:  Good  Fund of Knowledge:Good  Language: Good  Akathisia:  No  Handed: Right   AIMS (if indicated):    Assets:  Desire for Improvement  ADL's:  Intact  Cognition: WNL  Sleep:      Treatment Plan Summary: At this time the patient will continue seeing his therapist every other week. He will go ahead and restart just as well. We'll build up to full dose of 450 mg over approximately a week and a half. He'll try  to observe very closely visit anyway. He is my hope that does help his energy and his concentration and is constant to sign is a chronic problem. The purpose of the neuropsych testing disease determine the accuracy and agree concentration  problems, question of depression question of any organic etiology. The patient is not suicidal. Drinks no alcohol. Return to see me in 2 months. This patient has been on multiple psychotropic medications combinations is really not showing any significant benefit in terms of his mood state. The possibility of increasing his energy and concentration purely from Wellbutrin is not out of the question.

## 2018-01-15 ENCOUNTER — Encounter: Payer: Self-pay | Admitting: Psychology

## 2018-01-27 ENCOUNTER — Encounter: Payer: Self-pay | Admitting: Psychology

## 2018-01-30 DIAGNOSIS — J209 Acute bronchitis, unspecified: Secondary | ICD-10-CM | POA: Diagnosis not present

## 2018-02-19 ENCOUNTER — Encounter: Payer: Self-pay | Admitting: Psychology

## 2018-02-26 ENCOUNTER — Other Ambulatory Visit: Payer: Self-pay

## 2018-02-26 ENCOUNTER — Other Ambulatory Visit: Payer: Self-pay | Admitting: Vascular Surgery

## 2018-02-26 DIAGNOSIS — K551 Chronic vascular disorders of intestine: Secondary | ICD-10-CM

## 2018-03-06 ENCOUNTER — Other Ambulatory Visit: Payer: Self-pay

## 2018-03-06 ENCOUNTER — Ambulatory Visit (HOSPITAL_COMMUNITY): Payer: 59 | Attending: Cardiovascular Disease

## 2018-03-06 DIAGNOSIS — I351 Nonrheumatic aortic (valve) insufficiency: Secondary | ICD-10-CM | POA: Diagnosis not present

## 2018-03-06 DIAGNOSIS — R5383 Other fatigue: Secondary | ICD-10-CM | POA: Insufficient documentation

## 2018-03-06 DIAGNOSIS — R0600 Dyspnea, unspecified: Secondary | ICD-10-CM | POA: Diagnosis not present

## 2018-03-06 DIAGNOSIS — I77810 Thoracic aortic ectasia: Secondary | ICD-10-CM | POA: Diagnosis not present

## 2018-03-09 DIAGNOSIS — Z0000 Encounter for general adult medical examination without abnormal findings: Secondary | ICD-10-CM | POA: Diagnosis not present

## 2018-03-09 DIAGNOSIS — Z125 Encounter for screening for malignant neoplasm of prostate: Secondary | ICD-10-CM | POA: Diagnosis not present

## 2018-03-16 ENCOUNTER — Telehealth: Payer: Self-pay | Admitting: Internal Medicine

## 2018-03-16 NOTE — Telephone Encounter (Signed)
Spoke with patient and scheduled 1 year follow up next available follow up 05/13/18. Patient stated he continues to have shortness of breath walking up a flight of stairs but this is no change. He was hoping to get a sooner appointment. Scheduled follow up with Janan Ridge PA for 04/08/18.

## 2018-03-16 NOTE — Telephone Encounter (Signed)
New Message:    Pt had an Echo on 6-21-199. He wants to know if he needs a follow up visit?

## 2018-03-17 DIAGNOSIS — Z0000 Encounter for general adult medical examination without abnormal findings: Secondary | ICD-10-CM | POA: Diagnosis not present

## 2018-03-20 ENCOUNTER — Encounter (HOSPITAL_COMMUNITY): Payer: Self-pay | Admitting: Psychiatry

## 2018-03-20 ENCOUNTER — Ambulatory Visit (HOSPITAL_COMMUNITY): Payer: 59 | Admitting: Psychiatry

## 2018-03-20 VITALS — BP 110/77 | HR 94 | Ht 72.0 in | Wt 178.8 lb

## 2018-03-20 DIAGNOSIS — F339 Major depressive disorder, recurrent, unspecified: Secondary | ICD-10-CM

## 2018-03-20 MED ORDER — LEVOMILNACIPRAN HCL ER 20 MG PO CP24: 20.0000 mg | ORAL_CAPSULE | ORAL | 5 refills | Status: AC

## 2018-03-20 MED ORDER — BUPROPION HCL ER (XL) 150 MG PO TB24: ORAL_TABLET | ORAL | 4 refills | Status: DC

## 2018-03-20 NOTE — Progress Notes (Signed)
Psychiatric Initial Adult Assessment   Patient Identification: George Proctor MRN:  [id number] Date of Evaluation:  03/20/2018 Referral Source: Dr. Gershon Mussel Heeding Chief Complaint:    Visit Diagnosis:  No diagnosis found.  History of Present Illness Today the patient is no better. Despite the fact that his back on Wellbutrin doesn't notice improvement in his energy memory are motivation. In the previous session after stopping well which in he noticed a problem all these 3 symptoms and despite restarting the Wellbutrin expected to be better he claims he is not. Unfortunately has been scheduling problems with his neuropsych testing. Is now scheduled for December. He unfortunately got sick on the day of the testing. The patient continues in therapy although is not sure how helpful it is. Today her intervention was actually give him some Fetzima 20 mg stat to his Wellbutrin 450. The patient is vague about sleep process he actually sleeps about 7 or 8 hours. He goes to bed relatively early gets up at 4. He says he says the lawn. He says it takes him hours to get up to be able to go to work. I think he does a lot of things before he goes to work. He still works 40 hours talks a lot about retirement. His major complaint doesn't even seem to be really depression but rather a lack of energy and motivation. He describes himself but even if he gets a good night sleep he feels sleepy during the day. It is noted the patient is been treated and evaluated for chronic fatigue syndrome in the past but had little benefit from the medications offered I do believe in some ways he is a diagnostic dilemma. I think the neuropsych testing is important. I've asked him to continue planning to go in December for the testing. This patient is not suicidal. He drinks no alcohol   Past Psychiatric History: Seen psychiatrists 2 times been psychiatrically hospitalized 2 times in his made a suicide attempt in the past. Previous  Psychotropic Medications: Yes   Substance Abuse History in the last 12 months:  No.  Consequences of Substance Abuse: NA  Past Medical History:  Past Medical History:  Diagnosis Date  . Anxiety   . Depression   . Gait difficulty   . Memory loss     Past Surgical History:  Procedure Laterality Date  . ABDOMINAL SURGERY     Childhood  . BUNIONECTOMY      Family Psychiatric History:   Family History:  Family History  Problem Relation Age of Onset  . Aneurysm Mother 52       Thoracic  . Hypertension Father   . Heart attack Paternal Grandfather     Social History:   Social History   Socioeconomic History  . Marital status: Married    Spouse name: Not on file  . Number of children: 0  . Years of education: Veterinary surgeon  . Highest education level: Not on file  Occupational History  . Occupation: Scientist, research (physical sciences)  Social Needs  . Financial resource strain: Not on file  . Food insecurity:    Worry: Not on file    Inability: Not on file  . Transportation needs:    Medical: Not on file    Non-medical: Not on file  Tobacco Use  . Smoking status: Never Smoker  . Smokeless tobacco: Never Used  Substance and Sexual Activity  . Alcohol use: No    Alcohol/week: 0.0 oz    Comment: Less  than one drink per week.  . Drug use: No  . Sexual activity: Not Currently  Lifestyle  . Physical activity:    Days per week: Not on file    Minutes per session: Not on file  . Stress: Not on file  Relationships  . Social connections:    Talks on phone: Not on file    Gets together: Not on file    Attends religious service: Not on file    Active member of club or organization: Not on file    Attends meetings of clubs or organizations: Not on file    Relationship status: Not on file  Other Topics Concern  . Not on file  Social History Narrative   Lives at home with wife.   Right-handed.   3 cups caffeine per day.    Additional Social History:    Allergies:  No Known Allergies  Metabolic Disorder Labs: No results found for: HGBA1C, MPG Lab Results  Component Value Date   PROLACTIN  11/28/2007    10.1 (NOTE)     Reference Ranges:                 Male:                       2.1 -  17.1 ng/ml                 Male:   Pregnant          9.7 - 208.5 ng/mL                           Non Pregnant      2.8 -  29.2 ng/mL                           Post  Menopausal   1.8 -  20.3 ng/mL                     Lab Results  Component Value Date   CHOL 197 11/03/2007   TRIG 119 11/03/2007   HDL 37.8 (L) 11/03/2007   CHOLHDL 5.2 CALC 11/03/2007   VLDL 24 11/03/2007   LDLCALC 135 (H) 11/03/2007     Current Medications: Current Outpatient Medications  Medication Sig Dispense Refill  . B Complex Vitamins (VITAMIN B COMPLEX PO) Take by mouth.    Marland Kitchen buPROPion (WELLBUTRIN XL) 150 MG 24 hr tablet 3  qam 90 tablet 4  . fish oil-omega-3 fatty acids 1000 MG capsule Take 6 g by mouth daily.     . Multiple Vitamin (MULTIVITAMIN) capsule Take 1 capsule by mouth daily.    . ranitidine (ZANTAC) 150 MG capsule Take 150 mg by mouth as needed.     . Levomilnacipran HCl ER (FETZIMA) 20 MG CP24 Take 20 mg by mouth 1 day or 1 dose for 1 dose. 30 capsule 5   No current facility-administered medications for this visit.     Neurologic: Headache: No Seizure: No Paresthesias:No  Musculoskeletal: Strength & Muscle Tone: within normal limits Gait & Station: normal Patient leans: N/A  Psychiatric Specialty Exam: ROS  Blood pressure 110/77, pulse 94, height 6' (1.829 m), weight 178 lb 12.8 oz (81.1 kg), SpO2 95 %.Body mass index is 24.25 kg/m.  General Appearance: Casual  Eye Contact:  Good  Speech:  Clear and Coherent  Volume:  Normal  Mood:  Depressed  Affect:  Appropriate  Thought Process:Normal   Orientation:  Full (Time, Place, and Person)  Thought Content:  Logical  Suicidal Thoughts:  No  Homicidal Thoughts:  No  Memory:  NA  Judgement:   Good  Insight:  Good  Psychomotor Activity:  Concentration:    Recall:  Good  Fund of Knowledge:Good  Language: Good  Akathisia:  No  Handed: Right   AIMS (if indicated):    Assets:  Desire for Improvement  ADL's:  Intact  Cognition: WNL  Sleep:      Treatment Plan Summary: At this time the patient will continue taking Wellbutrin 450 will go ahead and add Fetzima 20 mg. He'll return to see me in 2 months and these no better I will actually increase this later antidepressant. Patient continue in therapy and continue to plan to get a neuropsych testing.

## 2018-03-24 DIAGNOSIS — R0989 Other specified symptoms and signs involving the circulatory and respiratory systems: Secondary | ICD-10-CM | POA: Insufficient documentation

## 2018-03-24 DIAGNOSIS — R6889 Other general symptoms and signs: Secondary | ICD-10-CM | POA: Diagnosis not present

## 2018-03-24 DIAGNOSIS — K219 Gastro-esophageal reflux disease without esophagitis: Secondary | ICD-10-CM | POA: Insufficient documentation

## 2018-04-08 ENCOUNTER — Ambulatory Visit: Payer: 59 | Admitting: Physician Assistant

## 2018-04-08 ENCOUNTER — Encounter: Payer: Self-pay | Admitting: Physician Assistant

## 2018-04-08 VITALS — BP 120/80 | HR 95 | Ht 72.0 in | Wt 180.0 lb

## 2018-04-08 DIAGNOSIS — I351 Nonrheumatic aortic (valve) insufficiency: Secondary | ICD-10-CM

## 2018-04-08 DIAGNOSIS — I77810 Thoracic aortic ectasia: Secondary | ICD-10-CM

## 2018-04-08 DIAGNOSIS — R0600 Dyspnea, unspecified: Secondary | ICD-10-CM

## 2018-04-08 NOTE — Patient Instructions (Signed)
Medication Instructions:   Your physician recommends that you continue on your current medications as directed. Please refer to the Current Medication list given to you today.   If you need a refill on your cardiac medications before your next appointment, please call your pharmacy.  Labwork: NONE ORDERED  TODAY    Testing/Procedures: NONE ORDERED  TODAY   Follow-Up:  WITH DR HILTY IN 3 TO 4 MONTH    Any Other Special Instructions Will Be Listed Below (If Applicable).

## 2018-04-08 NOTE — Progress Notes (Signed)
Cardiology Office Note    Date:  04/10/2018   ID:  George Proctor, DOB 1956-08-25, MRN [id number]  PCP:  [name], MD  Cardiologist:   Dr. Debara Pickett  Chief Complaint  Patient presents with  . Follow-up    seen for Dr. Debara Pickett.     History of Present Illness:  George Proctor is a 62 y.o. male with PMH of anxiety, depression, memory loss, and history of dyspnea and aortic regurgitation.  He has a coronary CT obtained on 10/23/2011 that showed 0 calcium score, normal right dominant coronary arteries without any plaque.  Previous cardiopulmonary stress test in February 2013 demonstrated exercise-induced myocardial dysfunction.  He was last seen by Dr. Debara Pickett in June 2018, echocardiogram obtained on 01/31/2017 demonstrated EF 55 to 60%, moderate focal basal hypertrophy, grade 1 DD, mild to moderate AI, aortic root dimension 46 mm.  He was having some degree of orthostatic hypotension as well after being placed on medication for depression and ADD.  Echocardiogram obtained on 03/06/2018 showed EF 60 to 65%, mild to moderate AI.  Since the last year, patient was seen in the ED on 11/17/2017 with chest pain.  CT angiogram of the chest concerning for SMA dissection of the there was a question of how acute this is on the CT.  It also shows that he has chronic dilatation of the aortic root measuring 4.2 cm, unchanged from 2015.  He was started on Zantac for atypical chest pain and discharged home to follow-up with Dr. Donnetta Hutching as outpatient.   Patient presents today for his annual follow-up.  He continues to have some dyspnea however this degree of dyspnea has not changed compared to last year.  He is able to use a push lawnmower to mow his lawn without much issue.  However he would become short of breath walking up a flight of stairs.  He denies any chest pain.  I have reviewed his recent echocardiogram with him.  His EKG obtained in March 2019 does not and show any ischemic changes.  The degree of aortic  regurgitation should not be responsible for his shortness of breath either.  At this point, unless the degree of his dyspnea changes, I would not recommend a coronary CT to rule out anginal equivalents.  I recommended increased exercise tolerance for now and have him seeing Dr. Debara Pickett back in 3 to 4 months.  If shortness of breath does worsen, we can consider a coronary CT with FFR later.  Otherwise he has no lower extremity edema, orthopnea or PND.  His pulmonary exam is quite benign without any kind of wheezing, crackles or rhonchi.   Past Medical History:  Diagnosis Date  . Anxiety   . Depression   . Gait difficulty   . Memory loss     Past Surgical History:  Procedure Laterality Date  . ABDOMINAL SURGERY     Childhood  . BUNIONECTOMY      Current Medications: Outpatient Medications Prior to Visit  Medication Sig Dispense Refill  . B Complex Vitamins (VITAMIN B COMPLEX PO) Take by mouth.    Marland Kitchen buPROPion (WELLBUTRIN XL) 150 MG 24 hr tablet 3  qam 90 tablet 4  . fish oil-omega-3 fatty acids 1000 MG capsule Take 6 g by mouth daily.     . Multiple Vitamin (MULTIVITAMIN) capsule Take 1 capsule by mouth daily.    . ranitidine (ZANTAC) 150 MG capsule Take 150 mg by mouth as needed.      No  facility-administered medications prior to visit.      Allergies:   Patient has no known allergies.   Social History   Socioeconomic History  . Marital status: Married    Spouse name: Not on file  . Number of children: 0  . Years of education: Veterinary surgeon  . Highest education level: Not on file  Occupational History  . Occupation: Scientist, research (physical sciences)  Social Needs  . Financial resource strain: Not on file  . Food insecurity:    Worry: Not on file    Inability: Not on file  . Transportation needs:    Medical: Not on file    Non-medical: Not on file  Tobacco Use  . Smoking status: Never Smoker  . Smokeless tobacco: Never Used  Substance and Sexual Activity  . Alcohol  use: No    Alcohol/week: 0.0 oz    Comment: Less than one drink per week.  . Drug use: No  . Sexual activity: Not Currently  Lifestyle  . Physical activity:    Days per week: Not on file    Minutes per session: Not on file  . Stress: Not on file  Relationships  . Social connections:    Talks on phone: Not on file    Gets together: Not on file    Attends religious service: Not on file    Active member of club or organization: Not on file    Attends meetings of clubs or organizations: Not on file    Relationship status: Not on file  Other Topics Concern  . Not on file  Social History Narrative   Lives at home with wife.   Right-handed.   3 cups caffeine per day.     Family History:  The patient's family history includes Aneurysm (age of onset: 61) in his mother; Heart attack in his paternal grandfather; Hypertension in his father.   ROS:   Please see the history of present illness.    ROS All other systems reviewed and are negative.   PHYSICAL EXAM:   VS:  BP 120/80   Pulse 95   Ht 6' (1.829 m)   Wt 180 lb (81.6 kg)   SpO2 97%   BMI 24.41 kg/m    GEN: Well nourished, well developed, in no acute distress  HEENT: normal  Neck: no JVD, carotid bruits, or masses Cardiac: RRR; no murmurs, rubs, or gallops,no edema  Respiratory:  clear to auscultation bilaterally, normal work of breathing GI: soft, nontender, nondistended, + BS MS: no deformity or atrophy  Skin: warm and dry, no rash Neuro:  Alert and Oriented x 3, Strength and sensation are intact Psych: euthymic mood, full affect  Wt Readings from Last 3 Encounters:  04/08/18 180 lb (81.6 kg)  12/23/17 181 lb 11.2 oz (82.4 kg)  03/04/17 179 lb (81.2 kg)      Studies/Labs Reviewed:   EKG:  EKG is not ordered today.   Recent Labs: 11/17/2017: BUN 9; Creatinine, Ser 1.00; Hemoglobin 14.9; Platelets 219; Potassium 3.9; Sodium 141   Lipid Panel    Component Value Date/Time   CHOL 197 11/03/2007 1136   TRIG 119  11/03/2007 1136   HDL 37.8 (L) 11/03/2007 1136   CHOLHDL 5.2 CALC 11/03/2007 1136   VLDL 24 11/03/2007 1136   LDLCALC 135 (H) 11/03/2007 1136    Additional studies/ records that were reviewed today include:   Coronary CT 10/23/2011 Findings:  Calcium Score - 0  No significant noncardiac findings on review  of lung and soft tissue windows  Coronary Arteries:  Right dominant with no anomalies  LM- normal  LAD-normal        High take off OM/IM- normal        D1- large branching vessel normal  Circumflex- nondominant normal        OM1-normal  RCA- dominant and normal        PDA-normal        PLA-normal  Aorta:  There was mild to moderate dilatation of the sinus of valsalva area.  The right sinus was more dilated than left and noncoronary.  AP dimension was 4.0 cm  The ascending aorta was normal at 3.5 cm  There was a bovine arch with no other anomalies.  The descending thoracic aorta was normal at 2.6 cm  Impression:     1)    Calcium Score 0        2)    Normal right dominant coronary arteries 3)    Mild to moderate dilatation of the sinus of valsalva 4.0cm     CTA of chest 11/17/2017 IMPRESSION: Chronic dilatation of the aortic root 4.2 cm, unchanged from 2015. 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 No thrombus or branch occlusion in the SMA. No evidence of bowel edema. This dissection could be acute or chronic.  Aneurysmal dilatation right iliac artery 18 mm. Left iliac artery 12 Mm.     Echo 03/06/2018 LV EF: 60% -   65% Study Conclusions  - Left ventricle: The cavity size was normal. There was mild   concentric hypertrophy. Systolic function was normal. The   estimated ejection fraction was in the range of 60% to 65%. Wall   motion was normal; there were no regional wall motion   abnormalities. - Aortic valve: Transvalvular velocity was within the  normal range.   There was no stenosis. There was mild to moderate regurgitation.   Regurgitation pressure half-time: 443 ms. - Mitral valve: Transvalvular velocity was within the normal range.   There was no evidence for stenosis. There was no regurgitation. - Right ventricle: The cavity size was normal. Wall thickness was   normal. Systolic function was normal. - Atrial septum: No defect or patent foramen ovale was identified   by color flow Doppler. - Tricuspid valve: There was no regurgitation. - Pulmonary arteries: Systolic pressure was within the normal   range. PA peak pressure: 18 mm Hg (S).    ASSESSMENT:    1. Dyspnea, unspecified type   2. Nonrheumatic aortic valve insufficiency   3. Aortic root dilatation (HCC)      PLAN:  In order of problems listed above:  1. Dyspnea: Recent echocardiogram is unchanged with normal ejection fraction and mild to moderate AI.  He does not have any obvious chest pain to suggest ischemia.  I have reviewed the previous EKG obtained in March 2019, this was negative for ischemia as well.  His degree of dyspnea has not changed compared to last year, unless it changes, I would not recommend coronary CT with FFR.  I recommended increased exercise tolerance for now.  2. Aortic insufficiency: Stable on recent echocardiogram  3. Aortic root dilatation: Previous aortic root 4.6 cm on previous echocardiogram, recent CT angiogram of the chest abdomen and pelvis obtained in March 2019 showed aortic root measuring 4.2 cm.    Medication Adjustments/Labs and Tests Ordered: Current medicines are reviewed at length with the patient today.  Concerns regarding medicines are outlined above.  Medication changes, Labs and Tests ordered today are listed in the Patient Instructions below. Patient Instructions  Medication Instructions:   Your physician recommends that you continue on your current medications as directed. Please refer to the Current Medication  list given to you today.   If you need a refill on your cardiac medications before your next appointment, please call your pharmacy.  Labwork: NONE ORDERED  TODAY    Testing/Procedures: NONE ORDERED  TODAY   Follow-Up:  WITH DR HILTY IN 3 TO 4 MONTH    Any Other Special Instructions Will Be Listed Below (If Applicable).                                                                                                                                                      Hilbert Corrigan, [place]  04/10/2018 10:51 PM    [facility] [place], [place], [place]  [address] Phone: [phone]; Fax: [phone]

## 2018-04-10 ENCOUNTER — Encounter: Payer: Self-pay | Admitting: Physician Assistant

## 2018-04-17 DIAGNOSIS — M180 Bilateral primary osteoarthritis of first carpometacarpal joints: Secondary | ICD-10-CM | POA: Diagnosis not present

## 2018-04-17 DIAGNOSIS — M151 Heberden's nodes (with arthropathy): Secondary | ICD-10-CM | POA: Diagnosis not present

## 2018-04-17 DIAGNOSIS — M79643 Pain in unspecified hand: Secondary | ICD-10-CM | POA: Diagnosis not present

## 2018-04-17 DIAGNOSIS — M79646 Pain in unspecified finger(s): Secondary | ICD-10-CM | POA: Diagnosis not present

## 2018-04-17 DIAGNOSIS — M79641 Pain in right hand: Secondary | ICD-10-CM | POA: Diagnosis not present

## 2018-05-12 ENCOUNTER — Encounter: Payer: Self-pay | Admitting: Psychology

## 2018-05-12 ENCOUNTER — Encounter

## 2018-05-13 ENCOUNTER — Ambulatory Visit: Payer: Self-pay | Admitting: Internal Medicine

## 2018-06-11 ENCOUNTER — Ambulatory Visit (INDEPENDENT_AMBULATORY_CARE_PROVIDER_SITE_OTHER): Payer: 59 | Admitting: Psychiatry

## 2018-06-11 ENCOUNTER — Encounter (HOSPITAL_COMMUNITY): Payer: Self-pay | Admitting: Psychiatry

## 2018-06-11 VITALS — BP 134/89 | HR 85 | Resp 12 | Ht 73.0 in | Wt 187.6 lb

## 2018-06-11 DIAGNOSIS — F325 Major depressive disorder, single episode, in full remission: Secondary | ICD-10-CM

## 2018-06-11 MED ORDER — ZOLPIDEM TARTRATE ER 12.5 MG PO TBCR: 12.5000 mg | EXTENDED_RELEASE_TABLET | Freq: Every evening | ORAL | 3 refills | Status: DC | PRN

## 2018-06-11 MED ORDER — BUPROPION HCL ER (XL) 150 MG PO TB24: ORAL_TABLET | ORAL | 4 refills | Status: DC

## 2018-06-11 NOTE — Addendum Note (Signed)
Addended by: Norma Fredrickson on: 06/11/2018 04:26 PM   Modules accepted: Level of Service

## 2018-06-11 NOTE — Progress Notes (Signed)
Psychiatric Initial Adult Assessment   Patient Identification: George Proctor MRN:  [id number] Date of Evaluation:  06/11/2018 Referral Source: Dr. Gershon Mussel Heeding Chief Complaint:    Visit Diagnosis: Dysthymic disorder No diagnosis found.  History of Present Illness Today the patient is seen on time.  He added the [facility] but unfortunately he had side effects.  Mainly described having to urinate a great deal so he stopped it.  The patient's mood is the same.  He has chronic mild depression.  Today that he did share with [place] that he is not sleeping as long as he like to.  He says 8 hours is much better and is only been sleeping 6 hours.  He suspects that it probably affects his energy level.  He says in fact that he does feel sleepy during the day.  He is eating well can think and concentrate without problems.  He is actually considering retiring at this time.  He is also made a good decision to sell the house instead of renting.  This is his old house.  Presently he lives in his inherited house that he grew up in.  The patient is doing well in other ways.  He continues to go to work and function.  He drinks no alcohol uses no drugs.  He denies chest pain shortness of breath or any neurological symptoms he has neuropsych testing coming up in about 3 or 4 months.  This will be very important for [place].  He has been on multiple psychotropic medicines.   Past Psychiatric History: Seen psychiatrists 2 times been psychiatrically hospitalized 2 times in his made a suicide attempt in the past. Previous Psychotropic Medications: Yes   Substance Abuse History in the last 12 months:  No.  Consequences of Substance Abuse: NA  Past Medical History:  Past Medical History:  Diagnosis Date  . Anxiety   . Depression   . Gait difficulty   . Memory loss     Past Surgical History:  Procedure Laterality Date  . ABDOMINAL SURGERY     Childhood  . BUNIONECTOMY      Family Psychiatric History:   Family  History:  Family History  Problem Relation Age of Onset  . Aneurysm Mother 32       Thoracic  . Hypertension Father   . Heart attack Paternal Grandfather     Social History:   Social History   Socioeconomic History  . Marital status: Married    Spouse name: Not on file  . Number of children: 0  . Years of education: Veterinary surgeon  . Highest education level: Not on file  Occupational History  . Occupation: Scientist, research (physical sciences)  Social Needs  . Financial resource strain: Not on file  . Food insecurity:    Worry: Not on file    Inability: Not on file  . Transportation needs:    Medical: Not on file    Non-medical: Not on file  Tobacco Use  . Smoking status: Never Smoker  . Smokeless tobacco: Never Used  Substance and Sexual Activity  . Alcohol use: No    Alcohol/week: 0.0 standard drinks    Comment: Less than one drink per week.  . Drug use: No  . Sexual activity: Not Currently  Lifestyle  . Physical activity:    Days per week: Not on file    Minutes per session: Not on file  . Stress: Not on file  Relationships  . Social connections:  Talks on phone: Not on file    Gets together: Not on file    Attends religious service: Not on file    Active member of club or organization: Not on file    Attends meetings of clubs or organizations: Not on file    Relationship status: Not on file  Other Topics Concern  . Not on file  Social History Narrative   Lives at home with wife.   Right-handed.   3 cups caffeine per day.    Additional Social History:   Allergies:  No Known Allergies  Metabolic Disorder Labs: No results found for: HGBA1C, MPG Lab Results  Component Value Date   PROLACTIN  11/28/2007    10.1 (NOTE)     Reference Ranges:                 Male:                       2.1 -  17.1 ng/ml                 Male:   Pregnant          9.7 - 208.5 ng/mL                           Non Pregnant      2.8 -  29.2 ng/mL                           Post   Menopausal   1.8 -  20.3 ng/mL                     Lab Results  Component Value Date   CHOL 197 11/03/2007   TRIG 119 11/03/2007   HDL 37.8 (L) 11/03/2007   CHOLHDL 5.2 CALC 11/03/2007   VLDL 24 11/03/2007   LDLCALC 135 (H) 11/03/2007     Current Medications: Current Outpatient Medications  Medication Sig Dispense Refill  . B Complex Vitamins (VITAMIN B COMPLEX PO) Take by mouth.    Marland Kitchen buPROPion (WELLBUTRIN XL) 150 MG 24 hr tablet 3  qam 90 tablet 4  . fish oil-omega-3 fatty acids 1000 MG capsule Take 6 g by mouth daily.     . Multiple Vitamin (MULTIVITAMIN) capsule Take 1 capsule by mouth daily.    . ranitidine (ZANTAC) 150 MG capsule Take 150 mg by mouth as needed.      No current facility-administered medications for this visit.     Neurologic: Headache: No Seizure: No Paresthesias:No  Musculoskeletal: Strength & Muscle Tone: within normal limits Gait & Station: normal Patient leans: N/A  Psychiatric Specialty Exam: ROS  Blood pressure 134/89, pulse 85, resp. rate 12, height 6\' 1"  (1.854 m), weight 187 lb 9.6 oz (85.1 kg).Body mass index is 24.75 kg/m.  General Appearance: Casual  Eye Contact:  Good  Speech:  Clear and Coherent  Volume:  Normal  Mood:  Depressed  Affect:  Appropriate  Thought Process:Normal   Orientation:  Full (Time, Place, and Person)  Thought Content:  Logical  Suicidal Thoughts:  No  Homicidal Thoughts:  No  Memory:  NA  Judgement:  Good  Insight:  Good  Psychomotor Activity:  Concentration:    Recall:  Good  Fund of Knowledge:Good  Language: Good  Akathisia:  No  Handed: Right   AIMS (if indicated):    Assets:  Desire for Improvement  ADL's:  Intact  Cognition: WNL  Sleep:      Treatment Plan Summary: This patient's first problem is that of dysthymic disorder.  More acrylates persistent depression disorder.  He is failed most antidepressants.  We have even tried Adderall in the past with no success.  At this time he will  continue taking Wellbutrin at the highest dose and today we will go ahead and add Ambien CR.  Social work problem is that of clinical depression but will continue the above antidepressants.  His second problem is insomnia I will go ahead and add Ambien CR.  The patient does describe significant fatigue.  His illness is considered to be mild in nature.. This patient will be seen again in 5 months after his neuropsych testing.  He will continue with individual therapy.

## 2018-06-16 ENCOUNTER — Encounter: Payer: Self-pay | Admitting: Psychology

## 2018-06-25 DIAGNOSIS — R6889 Other general symptoms and signs: Secondary | ICD-10-CM | POA: Diagnosis not present

## 2018-06-25 DIAGNOSIS — K219 Gastro-esophageal reflux disease without esophagitis: Secondary | ICD-10-CM | POA: Diagnosis not present

## 2018-07-30 ENCOUNTER — Encounter: Payer: Self-pay | Admitting: *Deleted

## 2018-08-10 ENCOUNTER — Encounter: Payer: Self-pay | Admitting: Internal Medicine

## 2018-08-10 ENCOUNTER — Ambulatory Visit: Payer: 59 | Admitting: Internal Medicine

## 2018-08-10 VITALS — BP 110/80 | HR 87 | Ht 72.0 in | Wt 191.6 lb

## 2018-08-10 DIAGNOSIS — R0600 Dyspnea, unspecified: Secondary | ICD-10-CM

## 2018-08-10 DIAGNOSIS — I77810 Thoracic aortic ectasia: Secondary | ICD-10-CM | POA: Diagnosis not present

## 2018-08-10 DIAGNOSIS — I351 Nonrheumatic aortic (valve) insufficiency: Secondary | ICD-10-CM

## 2018-08-10 NOTE — Progress Notes (Signed)
OFFICE CONSULT NOTE  Chief Complaint:  Fatigue, depression  Primary Care Physician: [name], MD  HPI:  George Proctor is a 62 y.o. male who is being seen today for the evaluation of the above chief complaints at the request of [name], MD. George Proctor has a history of anxiety and depression as well as memory loss in the past. Recently he's been suffering from shortness of breath, fatigue, mental fogginess, anhedonia and symptoms concerning for depression. He denies any anginal symptoms, per se. His shortness of breath sometimes is associated with exertion and can be present at rest. He says that he's had a mental fogginess and has definitely struggled with depression symptoms. He is currently on nortriptyline for his depression. Otherwise he takes no medications. Is no history of hypertension, diabetes or significant dyslipidemia. Family history is significant for hypertension and heart disease in his father but he still living at age 60.   03/04/2017  George Proctor returns today for follow-up. He continues to report some significant fatigue and depression. He has been referred to a psychiatrist who started him on Adderall, bupropion and Rexulti with diagnosis of depression and ADD. He says now he feels worse than he was when he started the medications. He says he is extremely slow to get moving in the morning after starting on the medications. Today we reviewed his echocardiogram which demonstrates further effacement of the aortic valve measuring 4.6 cm at the sinus of Valsalva with mild to moderate aortic insufficiency. He is also hypotensive today 98/70. He is not on any blood pressure medication and reports she's had some occasional positional dizziness. This could be related to [place], which is a fairly newer medication, but has listed orthostatic hypotension as a side effect. Again, [name] had a cardiac MRA in 2015 which showed some worsening of his aortic root dilatation compared to an  earlier study, but no ascending aortic aneurysm. This echocardiogram also demonstrated an ascending aortic measurement of 3.8 cm. He reported that his mother died of a thoracic aneurysm as well.  08/10/2018  George Proctor is seen today in follow-up.  He underwent an echo this summer was seen by Almyra Deforest, PA-C for dyspnea.  This seems to be fairly consistent and does not seem to be worsening.  Is not thought that the AI is a cause for his dyspnea.  His aortic root is stable in size.  In fact he had a CT angiogram earlier this year which showed a somewhat smaller measurement 4.2 cm.  This will need to be followed by echo.  He does report his chest pain has improved and it was felt that this was reflux.  He is currently on a PPI.  He does see psychiatry but is been on and off various medications for depression and fatigue without much benefit.  PMHx:  Past Medical History:  Diagnosis Date  . Anxiety   . Depression   . Gait difficulty   . Memory loss     Past Surgical History:  Procedure Laterality Date  . ABDOMINAL SURGERY     Childhood  . BUNIONECTOMY      FAMHx:  Family History  Problem Relation Age of Onset  . Aneurysm Mother 69       Thoracic  . Hypertension Father   . Heart failure Father   . Heart disease Father   . Heart attack Paternal Grandfather   . Depression Cousin   . Bipolar disorder Cousin   . Schizophrenia Maternal  Uncle     SOCHx:   reports that he has never smoked. He has never used smokeless tobacco. He reports that he does not drink alcohol or use drugs.  ALLERGIES:  Allergies  Allergen Reactions  . Omeprazole Nausea And Vomiting    ROS: Pertinent items noted in HPI and remainder of comprehensive ROS otherwise negative.  HOME MEDS: Current Outpatient Medications on File Prior to Visit  Medication Sig Dispense Refill  . B Complex Vitamins (VITAMIN B COMPLEX PO) Take by mouth.    . fish oil-omega-3 fatty acids 1000 MG capsule Take 6 g by mouth daily.       . Multiple Vitamin (MULTIVITAMIN) capsule Take 1 capsule by mouth daily.     No current facility-administered medications on file prior to visit.     LABS/IMAGING: No results found for this or any previous visit (from the past 48 hour(s)). No results found.  LIPID PANEL:    Component Value Date/Time   CHOL 197 11/03/2007 1136   TRIG 119 11/03/2007 1136   HDL 37.8 (L) 11/03/2007 1136   CHOLHDL 5.2 CALC 11/03/2007 1136   VLDL 24 11/03/2007 1136   LDLCALC 135 (H) 11/03/2007 1136    WEIGHTS: Wt Readings from Last 3 Encounters:  08/10/18 191 lb 9.6 oz (86.9 kg)  04/08/18 180 lb (81.6 kg)  12/23/17 181 lb 11.2 oz (82.4 kg)    VITALS: BP 110/80   Pulse 87   Ht 6' (1.829 m)   Wt 191 lb 9.6 oz (86.9 kg)   BMI 25.99 kg/m   EXAM: General appearance: alert and no distress Neck: no carotid bruit, no JVD and thyroid not enlarged, symmetric, no tenderness/mass/nodules Lungs: clear to auscultation bilaterally Heart: regular rate and rhythm and diastolic murmur: early diastolic 2/6, blowing at apex Abdomen: soft, non-tender; bowel sounds normal; no masses,  no organomegaly Extremities: extremities normal, atraumatic, no cyanosis or edema Pulses: 2+ and symmetric Skin: Skin color, texture, turgor normal. No rashes or lesions Neurologic: Grossly normal Psych: Pleasant  EKG: Sinus rhythm 87 nonspecific ST changes-personally reviewed  ASSESSMENT: 1. Aortic root dilatation-measuring 4.1-4.6 cm (01/2017) 2. Mild to moderate aortic insufficiency 3. Shortness of breath, fatigue 4. Probable depression 5. Orthostatic hypotension  PLAN: 1.   George Proctor continues to have fatigue and depression.  His shortness of breath is stable.  His chest pain has resolved.  This was likely GERD.  His aortic root is fairly stable.  He has mild to moderate AI which is not likely contributing to his symptoms.  We will plan a repeat echo in a year.  He is followed by psychiatry but is not benefited  significantly from therapy and is discontinuing his wellbutrin.  Pixie Casino, MD, [facility], [place] Director of the Advanced Lipid Disorders &  Cardiovascular Risk Reduction Clinic Diplomate of the American Board of Clinical Lipidology Attending Cardiologist  Direct Dial: [phone]  Fax: [phone]  Website:  [email or web address] Doke 08/10/2018, 8:16 AM

## 2018-08-10 NOTE — Patient Instructions (Addendum)
Medication Instructions:  Continue current medications If you need a refill on your cardiac medications before your next appointment, please call your pharmacy.   Lab work: NONE  Testing/Procedures: Echocardiogram to be completed in 1 year (November 2020)  Follow-Up: At [facility], you and your health needs are our priority.  As part of our continuing mission to provide you with exceptional heart care, we have created designated Provider Care Teams.  These Care Teams include your primary Cardiologist (physician) and Advanced Practice Providers (APPs -  Physician Assistants and Nurse Practitioners) who all work together to provide you with the care you need, when you need it. You will need a follow up appointment in 12 months after echocardiogram.  Please call our office 2 months in advance to schedule this appointment.  You may see Dr. Debara Pickett or one of the following Advanced Practice Providers on your designated Care Team: Almyra Deforest, [place] . Fabian Sharp, PA-C  Any Other Special Instructions Will Be Listed Below (If Applicable).

## 2018-09-01 ENCOUNTER — Encounter

## 2018-09-01 ENCOUNTER — Encounter: Payer: Self-pay | Admitting: Psychology

## 2018-09-24 DIAGNOSIS — R69 Illness, unspecified: Secondary | ICD-10-CM | POA: Diagnosis not present

## 2018-10-08 ENCOUNTER — Encounter: Payer: Self-pay | Admitting: Psychology

## 2018-10-21 ENCOUNTER — Encounter (HOSPITAL_COMMUNITY): Payer: Self-pay | Admitting: Psychiatry

## 2018-10-21 ENCOUNTER — Ambulatory Visit (INDEPENDENT_AMBULATORY_CARE_PROVIDER_SITE_OTHER): Payer: 59 | Admitting: Psychiatry

## 2018-10-21 VITALS — BP 118/79 | HR 82 | Ht 72.0 in | Wt 186.0 lb

## 2018-10-21 DIAGNOSIS — F341 Dysthymic disorder: Secondary | ICD-10-CM | POA: Diagnosis not present

## 2018-10-21 DIAGNOSIS — R69 Illness, unspecified: Secondary | ICD-10-CM | POA: Diagnosis not present

## 2018-10-21 MED ORDER — ZOLPIDEM TARTRATE ER 12.5 MG PO TBCR: 12.5000 mg | EXTENDED_RELEASE_TABLET | Freq: Every evening | ORAL | 4 refills | Status: DC | PRN

## 2018-10-21 NOTE — Progress Notes (Signed)
Psychiatric Initial Adult Assessment   Patient Identification: George Proctor MRN:  [id number] Date of Evaluation:  10/21/2018 Referral Source: Dr. Gershon Mussel Heeding Chief Complaint:    Visit Diagnosis: Dysthymic disorder Dysthymic disorder  History of Present Illness  This patient is been seen in this clinic for chronic mild depression.  Most likely he has chronic dysthymic disorder that is medication refractory.  He has been on multiple antidepressants including SSRIs SNRIs tricyclic antidepressants and MAO inhibitors.  He has been on Abilify and Rexulti in the past.  He has been on Deplin in the past.  He is been on Provigil Strattera and Adderall all with minimal benefits.  The patient has been on testosterone replacement with minimal benefits.  Patient was in psychotherapy but no longer is.  At this time he describes mild dysthymia.  He feels flat and empty.  He feels unmotivated.  What is very relevant is the fact that while he is complaining of problems concentrating and thinking I suspect working has caused a great deal of stress for him.  I suspect he is not working well for successfully.  He plans to retire in 1 month.  I suspect when he retires the pressure will be off and he will feel less anxiety and distress.  Is not clear if the patient is sleeping well or not.  At this time the patient himself discontinued his Wellbutrin.  He is not taking any psychotropic medicines.  His initial Ambien prescription cost over $75 and did not fill it.  His neuropsych testing which was planned for last month was canceled because the test for no longer is in the community.  He is been reassigned to another neuropsychologist in [facility].  The testing will take place in about 4 months.  The point of the testing to identify the severity of her depression and to make estimates of how organic versus how functional this depression is.  The patient says he is sleeping only fairly well.  He is eating well.  He lacks  energy.  He denies being worthless.  In the past he has been hospitalized and was suicidal when he was in his 39s.  He is not been hospitalized for suicidal since then.  The patient describes himself as very psychomotor slowed.  He gets minimal enjoyment out of things but he denies being anhedonic.  His marriage seems stable.   Past Psychiatric History: Seen psychiatrists 2 times been psychiatrically hospitalized 2 times in his made a suicide attempt in the past. Previous Psychotropic Medications: Yes   Substance Abuse History in the last 12 months:  No.  Consequences of Substance Abuse: NA  Past Medical History:  Past Medical History:  Diagnosis Date  . Anxiety   . Depression   . Gait difficulty   . Memory loss     Past Surgical History:  Procedure Laterality Date  . ABDOMINAL SURGERY     Childhood  . BUNIONECTOMY      Family Psychiatric History:   Family History:  Family History  Problem Relation Age of Onset  . Aneurysm Mother 40       Thoracic  . Hypertension Father   . Heart failure Father   . Heart disease Father   . Heart attack Paternal Grandfather   . Depression Cousin   . Bipolar disorder Cousin   . Schizophrenia Maternal Uncle     Social History:   Social History   Socioeconomic History  . Marital status: Married  Spouse name: Not on file  . Number of children: 0  . Years of education: Veterinary surgeon  . Highest education level: Not on file  Occupational History  . Occupation: Scientist, research (physical sciences)  Social Needs  . Financial resource strain: Not on file  . Food insecurity:    Worry: Not on file    Inability: Not on file  . Transportation needs:    Medical: Not on file    Non-medical: Not on file  Tobacco Use  . Smoking status: Never Smoker  . Smokeless tobacco: Never Used  Substance and Sexual Activity  . Alcohol use: No    Alcohol/week: 0.0 standard drinks    Comment: Less than one drink per week.  . Drug use: No  . Sexual  activity: Not Currently  Lifestyle  . Physical activity:    Days per week: Not on file    Minutes per session: Not on file  . Stress: Not on file  Relationships  . Social connections:    Talks on phone: Not on file    Gets together: Not on file    Attends religious service: Not on file    Active member of club or organization: Not on file    Attends meetings of clubs or organizations: Not on file    Relationship status: Not on file  Other Topics Concern  . Not on file  Social History Narrative   Lives at home with wife.   Right-handed.   3 cups caffeine per day.    Additional Social History:   Allergies:   Allergies  Allergen Reactions  . Omeprazole Nausea And Vomiting    Metabolic Disorder Labs: No results found for: HGBA1C, MPG Lab Results  Component Value Date   PROLACTIN  11/28/2007    10.1 (NOTE)     Reference Ranges:                 Male:                       2.1 -  17.1 ng/ml                 Male:   Pregnant          9.7 - 208.5 ng/mL                           Non Pregnant      2.8 -  29.2 ng/mL                           Post  Menopausal   1.8 -  20.3 ng/mL                     Lab Results  Component Value Date   CHOL 197 11/03/2007   TRIG 119 11/03/2007   HDL 37.8 (L) 11/03/2007   CHOLHDL 5.2 CALC 11/03/2007   VLDL 24 11/03/2007   LDLCALC 135 (H) 11/03/2007     Current Medications: Current Outpatient Medications  Medication Sig Dispense Refill  . B Complex Vitamins (VITAMIN B COMPLEX PO) Take by mouth.    . fish oil-omega-3 fatty acids 1000 MG capsule Take 6 g by mouth daily.     . Multiple Vitamin (MULTIVITAMIN) capsule Take 1 capsule by mouth daily.    Marland Kitchen zolpidem (AMBIEN CR) 12.5 MG CR tablet Take 1 tablet (12.5 mg  total) by mouth at bedtime as needed for sleep. 30 tablet 4   No current facility-administered medications for this visit.     Neurologic: Headache: No Seizure: No Paresthesias:No  Musculoskeletal: Strength & Muscle Tone:  within normal limits Gait & Station: normal Patient leans: N/A  Psychiatric Specialty Exam: ROS  Blood pressure 118/79, pulse 82, height 6' (1.829 m), weight 186 lb (84.4 kg), SpO2 97 %.Body mass index is 25.23 kg/m.  General Appearance: Casual  Eye Contact:  Good  Speech:  Clear and Coherent  Volume:  Normal  Mood:  Depressed  Affect:  Appropriate  Thought Process:Normal   Orientation:  Full (Time, Place, and Person)  Thought Content:  Logical  Suicidal Thoughts:  No  Homicidal Thoughts:  No  Memory:  NA  Judgement:  Good  Insight:  Good  Psychomotor Activity:  Concentration:    Recall:  Good  Fund of Knowledge:Good  Language: Good  Akathisia:  No  Handed: Right   AIMS (if indicated):    Assets:  Desire for Improvement  ADL's:  Intact  Cognition: WNL  Sleep:      Treatment Plan Summary  This is first problem for this patient is chronic dysthymic disorder.  He is failed antidepressant treatments.  He is failed [place] treatment.  At this time our plan is for the patient to go ahead and have neuropsych testing in a number of months from now and to return to see me in 6 months.  It is my hope that once he changes his life and no longer needs to work, something will change within him.  The patient second problem is insomnia.  I think the possibility of getting a few more hours of sleep may help his mood but it is reasonable at this time to continue trying to get him Ambien 12.5 mg CR.  Today the patient received a prescription for the Ambien and we will look for a pharmacy where the cost is low.  At this time the patient actually is functioning fairly well.  I would describe his symptomatology is mild to moderate.  There is no question he has fatigue and lack of energy.  He also has been evaluated by his primary care doctor for other medical/organic causes such as hypothyroidism all of which have been negative.  He is not anemic.  So in essence he has 2 chronic stable conditions.  He  has dysthymic disorder and insomnia.  He seems to be refractory to antidepressants at this time.  He no longer is in therapy.  He was instructed to call us if he felt worse and/or suicidal.  I do not think he is a big risk for suicide.  I think retirement will reduce his stress and ultimately forced him to look at things that he can do for enjoyment.  He will be given a return appointment to see me in 6 months.

## 2018-11-30 DIAGNOSIS — J019 Acute sinusitis, unspecified: Secondary | ICD-10-CM | POA: Diagnosis not present

## 2019-03-18 DIAGNOSIS — Z0000 Encounter for general adult medical examination without abnormal findings: Secondary | ICD-10-CM | POA: Diagnosis not present

## 2019-03-24 DIAGNOSIS — R69 Illness, unspecified: Secondary | ICD-10-CM | POA: Diagnosis not present

## 2019-03-24 DIAGNOSIS — Z0000 Encounter for general adult medical examination without abnormal findings: Secondary | ICD-10-CM | POA: Diagnosis not present

## 2019-03-24 DIAGNOSIS — K219 Gastro-esophageal reflux disease without esophagitis: Secondary | ICD-10-CM | POA: Diagnosis not present

## 2019-03-24 DIAGNOSIS — Z8269 Family history of other diseases of the musculoskeletal system and connective tissue: Secondary | ICD-10-CM | POA: Diagnosis not present

## 2019-03-24 DIAGNOSIS — E291 Testicular hypofunction: Secondary | ICD-10-CM | POA: Diagnosis not present

## 2019-03-24 DIAGNOSIS — Z23 Encounter for immunization: Secondary | ICD-10-CM | POA: Diagnosis not present

## 2019-03-29 ENCOUNTER — Telehealth: Payer: Self-pay | Admitting: Internal Medicine

## 2019-03-29 NOTE — Telephone Encounter (Signed)
Spoke with patient concerning chest pain referral packet from PCP. Patient scheduled for MD in office visit on 7/20 @ 0830  Patient has chest pain that comes and goes. Not correlated with activity that patient can tell. Patient states the chest pain is more of a "sensation". Rates 1 of 10 on pain scale.        COVID-19 Pre-Screening Questions:  . In the past 7 to 10 days have you had a cough, shortness of breath, headache, congestion, fever (100 or greater) body aches, chills, sore throat, or sudden loss of taste or sense of smell? NO . Have you been around anyone with known Covid 19? NO . Have you been around anyone who is awaiting Covid 19 test results in the past 7 to 10 days? NO . Have you been around anyone who has been exposed to Covid 19, or has mentioned symptoms of Covid 19 within the past 7 to 10 days? NO  If you have any concerns/questions about symptoms patients report during screening (either on the phone or at threshold). Contact the provider seeing the patient or DOD for further guidance.  If neither are available contact a member of the leadership team.

## 2019-04-01 DIAGNOSIS — R4189 Other symptoms and signs involving cognitive functions and awareness: Secondary | ICD-10-CM | POA: Diagnosis not present

## 2019-04-01 DIAGNOSIS — F329 Major depressive disorder, single episode, unspecified: Secondary | ICD-10-CM | POA: Diagnosis not present

## 2019-04-02 ENCOUNTER — Telehealth: Payer: Self-pay | Admitting: Internal Medicine

## 2019-04-02 NOTE — Telephone Encounter (Signed)
I called pt to confirmhis apt on 04-05-19 with Dr Debara Pickett.       COVID-19 Pre-Screening Questions:   In the past 7 to 10 days have you had a cough,  shortness of breath, headache, congestion, fever (100 or greater) body aches, chills, sore throat, or sudden loss of taste or sense of smell? no  Have you been around anyone with known Covid 19.  Have you been around anyone who is awaiting Covid 19 test results in the past 7 to 10 days? no Have you been around anyone who has been exposed to Covid 19, or has mentioned symptoms of Covid 19 within the past 7 to 10 days? no If you have any concerns/questions about symptoms patients report during screening (either on the phone or at threshold). Contact the provider seeing the patient or DOD for further guidance.  If neither are available contact a member of the leadership team.

## 2019-04-05 ENCOUNTER — Ambulatory Visit: Payer: 59 | Admitting: Internal Medicine

## 2019-04-05 ENCOUNTER — Encounter: Payer: Self-pay | Admitting: Internal Medicine

## 2019-04-05 ENCOUNTER — Other Ambulatory Visit: Payer: Self-pay

## 2019-04-05 VITALS — BP 128/77 | HR 115 | Temp 97.3°F | Ht 74.0 in | Wt 185.0 lb

## 2019-04-05 DIAGNOSIS — I351 Nonrheumatic aortic (valve) insufficiency: Secondary | ICD-10-CM | POA: Diagnosis not present

## 2019-04-05 DIAGNOSIS — R0789 Other chest pain: Secondary | ICD-10-CM

## 2019-04-05 DIAGNOSIS — I77810 Thoracic aortic ectasia: Secondary | ICD-10-CM | POA: Diagnosis not present

## 2019-04-05 NOTE — Progress Notes (Signed)
OFFICE CONSULT NOTE  Chief Complaint:  Fatigue, depression, atypical chest pain  Primary Care Physician: [name], MD  HPI:  George Proctor is a 63 y.o. male who is being seen today for the evaluation of the above chief complaints at the request of [name], MD. George Proctor has a history of anxiety and depression as well as memory loss in the past. Recently he's been suffering from shortness of breath, fatigue, mental fogginess, anhedonia and symptoms concerning for depression. He denies any anginal symptoms, per se. His shortness of breath sometimes is associated with exertion and can be present at rest. He says that he's had a mental fogginess and has definitely struggled with depression symptoms. He is currently on nortriptyline for his depression. Otherwise he takes no medications. Is no history of hypertension, diabetes or significant dyslipidemia. Family history is significant for hypertension and heart disease in his father but he still living at age 56.   03/04/2017  George Proctor returns today for follow-up. He continues to report some significant fatigue and depression. He has been referred to a psychiatrist who started him on Adderall, bupropion and Rexulti with diagnosis of depression and ADD. He says now he feels worse than he was when he started the medications. He says he is extremely slow to get moving in the morning after starting on the medications. Today we reviewed his echocardiogram which demonstrates further effacement of the aortic valve measuring 4.6 cm at the sinus of Valsalva with mild to moderate aortic insufficiency. He is also hypotensive today 98/70. He is not on any blood pressure medication and reports she's had some occasional positional dizziness. This could be related to [place], which is a fairly newer medication, but has listed orthostatic hypotension as a side effect. Again, George Proctor had a cardiac MRA in 2015 which showed some worsening of his aortic root  dilatation compared to an earlier study, but no ascending aortic aneurysm. This echocardiogram also demonstrated an ascending aortic measurement of 3.8 cm. He reported that his mother died of a thoracic aneurysm as well.  08/10/2018  George Proctor is seen today in follow-up.  He underwent an echo this summer was seen by Almyra Deforest, PA-C for dyspnea.  This seems to be fairly consistent and does not seem to be worsening.  Is not thought that the AI is a cause for his dyspnea.  His aortic root is stable in size.  In fact he had a CT angiogram earlier this year which showed a somewhat smaller measurement 4.2 cm.  This will need to be followed by echo.  He does report his chest pain has improved and it was felt that this was reflux.  He is currently on a PPI.  He does see psychiatry but is been on and off various medications for depression and fatigue without much benefit.  04/05/2019  George Proctor is referred back for follow-up today.  He recently saw his PCP and had described some chest discomfort.  This was left sternal discomfort which was in a small area and rated as 1 out of 10 in intensity.  It did not radiate.  Subsequently has gone away.  Does not sound cardiac in nature.  He continues to struggle with depression and inactivity.  He says antidepressants including stimulants that were not effective for him.  He is not currently on any medications other than a PPI.  EKG performed in their office showed sinus rhythm with some nonspecific QRS changes.  I repeated an  EKG today which shows sinus tachycardia at 107 without ischemic changes and some possible right atrial enlargement.  He does have a history of an aneurysmal thoracic aorta as well as the descending aortic aneurysmal segment.  He was seen by Dr. early about a year ago who recommended repeat imaging.  A CT has been ordered but not obtained.  PMHx:  Past Medical History:  Diagnosis Date  . Anxiety   . Depression   . Gait difficulty   . Memory loss      Past Surgical History:  Procedure Laterality Date  . ABDOMINAL SURGERY     Childhood  . BUNIONECTOMY      FAMHx:  Family History  Problem Relation Age of Onset  . Aneurysm Mother 82       Thoracic  . Hypertension Father   . Heart failure Father   . Heart disease Father   . Heart attack Paternal Grandfather   . Depression Cousin   . Bipolar disorder Cousin   . Schizophrenia Maternal Uncle     SOCHx:   reports that he has never smoked. He has never used smokeless tobacco. He reports that he does not drink alcohol or use drugs.  ALLERGIES:  Allergies  Allergen Reactions  . Omeprazole Nausea And Vomiting    ROS: Pertinent items noted in HPI and remainder of comprehensive ROS otherwise negative.  HOME MEDS: Current Outpatient Medications on File Prior to Visit  Medication Sig Dispense Refill  . Ascorbic Acid (VITAMIN C PO) Take 2,000 mg by mouth.     . B Complex Vitamins (VITAMIN B COMPLEX PO) Take by mouth.    . fish oil-omega-3 fatty acids 1000 MG capsule Take 6 g by mouth daily.     . Multiple Vitamin (MULTIVITAMIN) capsule Take 1 capsule by mouth daily.    . pantoprazole (PROTONIX) 40 MG tablet     . VITAMIN D PO Take by mouth.     No current facility-administered medications on file prior to visit.     LABS/IMAGING: No results found for this or any previous visit (from the past 48 hour(s)). No results found.  LIPID PANEL:    Component Value Date/Time   CHOL 197 11/03/2007 1136   TRIG 119 11/03/2007 1136   HDL 37.8 (L) 11/03/2007 1136   CHOLHDL 5.2 CALC 11/03/2007 1136   VLDL 24 11/03/2007 1136   LDLCALC 135 (H) 11/03/2007 1136    WEIGHTS: Wt Readings from Last 3 Encounters:  04/05/19 185 lb (83.9 kg)  08/10/18 191 lb 9.6 oz (86.9 kg)  04/08/18 180 lb (81.6 kg)    VITALS: BP 128/77   Pulse (!) 115   Temp (!) 97.3 F (36.3 C)   Ht 6\' 2"  (1.88 m)   Wt 185 lb (83.9 kg)   SpO2 93%   BMI 23.75 kg/m   EXAM: General appearance: alert and no  distress Neck: no carotid bruit, no JVD and thyroid not enlarged, symmetric, no tenderness/mass/nodules Lungs: clear to auscultation bilaterally Heart: regular rate and rhythm and diastolic murmur: Proctor diastolic 2/6, blowing at apex Abdomen: soft, non-tender; bowel sounds normal; no masses,  no organomegaly Extremities: extremities normal, atraumatic, no cyanosis or edema Pulses: 2+ and symmetric Skin: Skin color, texture, turgor normal. No rashes or lesions Neurologic: Grossly normal Psych: Pleasant  EKG: Sinus tachycardia 107, right atrial marginal-personally reviewed  ASSESSMENT: 1. Atypical chest pain 2. Aortic root dilatation-measuring 4.1-4.6 cm (01/2017) 3. Mild to moderate aortic insufficiency 4. Shortness of breath, fatigue 5.  Probable depression 6. Orthostatic hypotension  PLAN: 1.   George Proctor has a history of aortic root dilatation with varying sizes.  He is a repeat CT scan ordered by Dr. early and was supposed to see him in follow-up, for his ascending aortic aneurysm.  I encouraged him to get the CT scan performed and follow-up with Dr. early.  His chest discomfort is atypical and not likely cardiac.  EKG is stable.  Labs reviewed from Dr. Lambert Mody office show essentially normal CBC and metabolic profile with reasonably well-controlled dyslipidemia and LDL of 109.  Follow-up annually or sooner as necessary.  Pixie Casino, MD, [facility], [place] Director of the Advanced Lipid Disorders &  Cardiovascular Risk Reduction Clinic Diplomate of the American Board of Clinical Lipidology Attending Cardiologist  Direct Dial: [phone]  Fax: [phone]  Website:  [email or web address] 04/05/2019, 9:42 AM

## 2019-04-05 NOTE — Patient Instructions (Signed)
Medication Instructions:  Your physician recommends that you continue on your current medications as directed. Please refer to the Current Medication list given to you today.  If you need a refill on your cardiac medications before your next appointment, please call your pharmacy.   Testing/Procedures: Dr. Debara Pickett recommends that you schedule the CT study ordered by Dr. Donnetta Hutching with [facility] - office # is [phone]  Follow-Up: At [facility], you and your health needs are our priority.  As part of our continuing mission to provide you with exceptional heart care, we have created designated Provider Care Teams.  These Care Teams include your primary Cardiologist (physician) and Advanced Practice Providers (APPs -  Physician Assistants and Nurse Practitioners) who all work together to provide you with the care you need, when you need it. You will need a follow up appointment in 12 months.  Please call our office 2 months in advance to schedule this appointment.  You may see Dr. Debara Pickett or one of the following Advanced Practice Providers on your designated Care Team: Almyra Deforest, [place] . Fabian Sharp, PA-C  Any Other Special Instructions Will Be Listed Below (If Applicable).

## 2019-04-06 DIAGNOSIS — F332 Major depressive disorder, recurrent severe without psychotic features: Secondary | ICD-10-CM | POA: Diagnosis not present

## 2019-04-06 DIAGNOSIS — F341 Dysthymic disorder: Secondary | ICD-10-CM | POA: Diagnosis not present

## 2019-04-06 DIAGNOSIS — F411 Generalized anxiety disorder: Secondary | ICD-10-CM | POA: Diagnosis not present

## 2019-04-06 DIAGNOSIS — R413 Other amnesia: Secondary | ICD-10-CM | POA: Diagnosis not present

## 2019-04-21 ENCOUNTER — Other Ambulatory Visit: Payer: Self-pay

## 2019-04-21 ENCOUNTER — Ambulatory Visit (HOSPITAL_COMMUNITY): Payer: 59 | Admitting: Psychiatry

## 2019-05-21 DIAGNOSIS — Z23 Encounter for immunization: Secondary | ICD-10-CM | POA: Diagnosis not present

## 2019-05-26 DIAGNOSIS — Z23 Encounter for immunization: Secondary | ICD-10-CM | POA: Diagnosis not present

## 2019-06-01 ENCOUNTER — Other Ambulatory Visit: Payer: Self-pay | Admitting: Vascular Surgery

## 2019-06-01 DIAGNOSIS — K551 Chronic vascular disorders of intestine: Secondary | ICD-10-CM

## 2019-06-30 ENCOUNTER — Other Ambulatory Visit: Payer: Self-pay

## 2019-06-30 ENCOUNTER — Ambulatory Visit (INDEPENDENT_AMBULATORY_CARE_PROVIDER_SITE_OTHER): Payer: 59 | Admitting: Psychiatry

## 2019-06-30 DIAGNOSIS — F341 Dysthymic disorder: Secondary | ICD-10-CM | POA: Diagnosis not present

## 2019-06-30 DIAGNOSIS — R69 Illness, unspecified: Secondary | ICD-10-CM | POA: Diagnosis not present

## 2019-06-30 NOTE — Progress Notes (Signed)
Psychiatric Initial Adult Assessment   Patient Identification: George Proctor MRN:  [id number] Date of Evaluation:  06/30/2019 Referral Source: Dr. Gershon Mussel Heeding Chief Complaint:    Visit Diagnosis: Dysthymic disorder Dysthymic disorder  History of Present Illness  Today we reviewed with the patient his past psychiatric care he received here.  Specifically we reviewed his neuropsych testing done a few months ago in [facility].  That study demonstrated that there is no clear organic etiology to explain his cognitive problems.  In essence I shared with him which unfortunately they did not that he had no clear evidence of a dementing process.  There are no evidence of an organic process.  They could only attribute his symptomatology to a depression/anxiety state.  The examiner's were aware that he has been through multiple treatments without any success.  The possibility that this was related to his character or his personality was not really investigated in these neuropsych tests.  I do suspect that this is a chronic long-term feature of dysthymia in this patient.  He has had this for over a decade where he feels unmotivated no energy and not happy with life.  Today I forced him to talk about what he wanted for the future and he did not describe an element in his bucket list to go visit an old friend in [place].  The patient also has reduce his stress by no longer having the second house that he was responsible for fixing up.  Now he just lives with his wife in a home that does actually need some work.  But importantly the patient is cut back on his work to the point where he is nearly completely retired.  He has 1 left over project that he is still working on.  The patient is sleep is chronically poor.  He goes to sleep fairly well but in about 3 to 4 to 5 hours he awakens and has a hard time getting back to sleep then during the day he will take a nap now and then which is not all that effective.  His  energy level is low which might be related to issues with insomnia and poor sleep.  Patient has been on multiple sleeping aids without much success.  He tried Ambien which she says just did not make him feel well.  He denies any psychotic symptoms.  He has mild anhedonia.  His appetite is normal.  His ability to think and concentration he will say is abnormally low.  But overall his symptomatology is not much different than the way it is been over the last few years.  In essence I have little evidence to believe that in any way he is progressing in a bad or good way.  Again certainly he is not suicidal he does all his basic and institutional ADLs.  He does say he has some trouble doing bills.  His wife works with him feeds him well.  Past Psychiatric History: Seen psychiatrists 2 times been psychiatrically hospitalized 2 times in his made a suicide attempt in the past. Previous Psychotropic Medications: Yes   Substance Abuse History in the last 12 months:  No.  Consequences of Substance Abuse: NA  Past Medical History:  Past Medical History:  Diagnosis Date  . Anxiety   . Depression   . Gait difficulty   . Memory loss     Past Surgical History:  Procedure Laterality Date  . ABDOMINAL SURGERY     Childhood  . BUNIONECTOMY  Family Psychiatric History:   Family History:  Family History  Problem Relation Age of Onset  . Aneurysm Mother 106       Thoracic  . Hypertension Father   . Heart failure Father   . Heart disease Father   . Heart attack Paternal Grandfather   . Depression Cousin   . Bipolar disorder Cousin   . Schizophrenia Maternal Uncle     Social History:   Social History   Socioeconomic History  . Marital status: Married    Spouse name: Not on file  . Number of children: 0  . Years of education: Veterinary surgeon  . Highest education level: Not on file  Occupational History  . Occupation: Scientist, research (physical sciences)  Social Needs  . Financial resource  strain: Not on file  . Food insecurity    Worry: Not on file    Inability: Not on file  . Transportation needs    Medical: Not on file    Non-medical: Not on file  Tobacco Use  . Smoking status: Never Smoker  . Smokeless tobacco: Never Used  Substance and Sexual Activity  . Alcohol use: No    Alcohol/week: 0.0 standard drinks    Comment: Less than one drink per week.  . Drug use: No  . Sexual activity: Not Currently  Lifestyle  . Physical activity    Days per week: Not on file    Minutes per session: Not on file  . Stress: Not on file  Relationships  . Social Herbalist on phone: Not on file    Gets together: Not on file    Attends religious service: Not on file    Active member of club or organization: Not on file    Attends meetings of clubs or organizations: Not on file    Relationship status: Not on file  Other Topics Concern  . Not on file  Social History Narrative   Lives at home with wife.   Right-handed.   3 cups caffeine per day.    Additional Social History:   Allergies:   Allergies  Allergen Reactions  . Omeprazole Nausea And Vomiting    Metabolic Disorder Labs: No results found for: HGBA1C, MPG Lab Results  Component Value Date   PROLACTIN  11/28/2007    10.1 (NOTE)     Reference Ranges:                 Male:                       2.1 -  17.1 ng/ml                 Male:   Pregnant          9.7 - 208.5 ng/mL                           Non Pregnant      2.8 -  29.2 ng/mL                           Post  Menopausal   1.8 -  20.3 ng/mL                     Lab Results  Component Value Date   CHOL 197 11/03/2007   TRIG 119 11/03/2007   HDL 37.8 (L) 11/03/2007  CHOLHDL 5.2 CALC 11/03/2007   VLDL 24 11/03/2007   LDLCALC 135 (H) 11/03/2007     Current Medications: Current Outpatient Medications  Medication Sig Dispense Refill  . Ascorbic Acid (VITAMIN C PO) Take 2,000 mg by mouth.     . B Complex Vitamins (VITAMIN B COMPLEX PO) Take  by mouth.    . fish oil-omega-3 fatty acids 1000 MG capsule Take 6 g by mouth daily.     . Multiple Vitamin (MULTIVITAMIN) capsule Take 1 capsule by mouth daily.    . pantoprazole (PROTONIX) 40 MG tablet     . VITAMIN D PO Take by mouth.     No current facility-administered medications for this visit.     Neurologic: Headache: No Seizure: No Paresthesias:No  Musculoskeletal: Strength & Muscle Tone: within normal limits Gait & Station: normal Patient leans: N/A  Psychiatric Specialty Exam: ROS  There were no vitals taken for this visit.There is no height or weight on file to calculate BMI.  General Appearance: Casual  Eye Contact:  Good  Speech:  Clear and Coherent  Volume:  Normal  Mood:  Depressed  Affect:  Appropriate  Thought Process:Normal   Orientation:  Full (Time, Place, and Person)  Thought Content:  Logical  Suicidal Thoughts:  No  Homicidal Thoughts:  No  Memory:  NA  Judgement:  Good  Insight:  Good  Psychomotor Activity:  Concentration:    Recall:  Good  Fund of Knowledge:Good  Language: Good  Akathisia:  No  Handed: Right   AIMS (if indicated):    Assets:  Desire for Improvement  ADL's:  Intact  Cognition: WNL  Sleep:      Treatment Plan Summary  At this time the patient is taking no medications.  He is not in psychotherapy.  I do not think he is acutely suicidal.  I think over the next few months he will finish were completely and possibly consider taking a trip to [place].  He exercises very little.  I made recommendations to try to adjust his life by trying to exercise and eating a better quality of foods.  At this time I do not think a psychotropic would be indicated.  The patient is agreed to return to see me in 4 months to talk further.  I would consider this to be supportive psychotherapy.  The patient understood that if he was to get suicidal he could call anytime.  At this time he is not suicidal.  He is chronic mild suicidal thoughts which  is been at the same level for nearly a decade.  He denies being anxious.

## 2019-07-02 ENCOUNTER — Other Ambulatory Visit: Payer: Self-pay | Admitting: Vascular Surgery

## 2019-07-02 DIAGNOSIS — K559 Vascular disorder of intestine, unspecified: Secondary | ICD-10-CM

## 2019-07-06 ENCOUNTER — Other Ambulatory Visit: Payer: 59

## 2019-07-13 ENCOUNTER — Ambulatory Visit: Payer: 59 | Admitting: Vascular Surgery

## 2019-08-11 ENCOUNTER — Ambulatory Visit (HOSPITAL_COMMUNITY): Payer: 59 | Attending: Cardiovascular Disease

## 2019-08-11 ENCOUNTER — Other Ambulatory Visit: Payer: Self-pay

## 2019-08-11 DIAGNOSIS — I77810 Thoracic aortic ectasia: Secondary | ICD-10-CM | POA: Insufficient documentation

## 2019-10-12 ENCOUNTER — Telehealth: Payer: Self-pay | Admitting: Internal Medicine

## 2019-10-12 NOTE — Telephone Encounter (Signed)
New Message:      LVM for pt to call and schedule an appt with Isaac Laud for a Virtual Visit on 10-13-19 or Dr Nicanor Bake next week for a Virtual Visit.

## 2019-11-03 ENCOUNTER — Ambulatory Visit (HOSPITAL_COMMUNITY): Payer: 59 | Admitting: Psychiatry

## 2019-11-09 DIAGNOSIS — F329 Major depressive disorder, single episode, unspecified: Secondary | ICD-10-CM | POA: Diagnosis not present

## 2019-11-09 DIAGNOSIS — K219 Gastro-esophageal reflux disease without esophagitis: Secondary | ICD-10-CM | POA: Diagnosis not present

## 2019-11-09 DIAGNOSIS — R5383 Other fatigue: Secondary | ICD-10-CM | POA: Diagnosis not present

## 2019-11-09 DIAGNOSIS — R519 Headache, unspecified: Secondary | ICD-10-CM | POA: Diagnosis not present

## 2019-11-09 DIAGNOSIS — E291 Testicular hypofunction: Secondary | ICD-10-CM | POA: Diagnosis not present

## 2019-11-09 DIAGNOSIS — R69 Illness, unspecified: Secondary | ICD-10-CM | POA: Diagnosis not present

## 2019-11-09 DIAGNOSIS — Z1321 Encounter for screening for nutritional disorder: Secondary | ICD-10-CM | POA: Diagnosis not present

## 2019-11-09 DIAGNOSIS — Z8719 Personal history of other diseases of the digestive system: Secondary | ICD-10-CM | POA: Diagnosis not present

## 2019-11-09 DIAGNOSIS — Z8619 Personal history of other infectious and parasitic diseases: Secondary | ICD-10-CM | POA: Diagnosis not present

## 2019-11-11 DIAGNOSIS — M79646 Pain in unspecified finger(s): Secondary | ICD-10-CM | POA: Diagnosis not present

## 2019-11-11 DIAGNOSIS — M151 Heberden's nodes (with arthropathy): Secondary | ICD-10-CM | POA: Diagnosis not present

## 2019-11-11 DIAGNOSIS — M1990 Unspecified osteoarthritis, unspecified site: Secondary | ICD-10-CM | POA: Diagnosis not present

## 2019-11-11 DIAGNOSIS — M542 Cervicalgia: Secondary | ICD-10-CM | POA: Diagnosis not present

## 2019-11-26 DIAGNOSIS — Z8719 Personal history of other diseases of the digestive system: Secondary | ICD-10-CM | POA: Diagnosis not present

## 2019-11-26 DIAGNOSIS — E291 Testicular hypofunction: Secondary | ICD-10-CM | POA: Diagnosis not present

## 2019-11-26 DIAGNOSIS — K219 Gastro-esophageal reflux disease without esophagitis: Secondary | ICD-10-CM | POA: Diagnosis not present

## 2019-11-26 DIAGNOSIS — R69 Illness, unspecified: Secondary | ICD-10-CM | POA: Diagnosis not present

## 2019-11-26 DIAGNOSIS — R519 Headache, unspecified: Secondary | ICD-10-CM | POA: Diagnosis not present

## 2019-11-26 DIAGNOSIS — Z1321 Encounter for screening for nutritional disorder: Secondary | ICD-10-CM | POA: Diagnosis not present

## 2019-11-26 DIAGNOSIS — R5383 Other fatigue: Secondary | ICD-10-CM | POA: Diagnosis not present

## 2019-11-26 DIAGNOSIS — W57XXXA Bitten or stung by nonvenomous insect and other nonvenomous arthropods, initial encounter: Secondary | ICD-10-CM | POA: Diagnosis not present

## 2019-11-26 DIAGNOSIS — Z8619 Personal history of other infectious and parasitic diseases: Secondary | ICD-10-CM | POA: Diagnosis not present

## 2019-12-18 ENCOUNTER — Ambulatory Visit: Payer: 59 | Attending: Internal Medicine

## 2019-12-18 DIAGNOSIS — Z23 Encounter for immunization: Secondary | ICD-10-CM

## 2019-12-18 NOTE — Progress Notes (Signed)
   Covid-19 Vaccination Clinic  Name:  George Proctor    MRN: [id number] DOB: [birth year]  12/18/2019  [name] was observed post Covid-19 immunization for 15 minutes without incident. He was provided with Vaccine Information Sheet and instruction to access the V-Safe system.   [name] was instructed to call 911 with any severe reactions post vaccine: Marland Kitchen Difficulty breathing  . Swelling of face and throat  . A fast heartbeat  . A bad rash all over body  . Dizziness and weakness   Immunizations Administered    Name Date Dose VIS Date Route   Pfizer COVID-19 Vaccine 12/18/2019 12:10 PM 0.3 mL 08/27/2019 Intramuscular   Manufacturer: [employer], [employer]   Lot: [id number]   [place]: [id number]

## 2020-01-04 ENCOUNTER — Ambulatory Visit: Payer: 59 | Admitting: Physician Assistant

## 2020-01-06 DIAGNOSIS — E291 Testicular hypofunction: Secondary | ICD-10-CM | POA: Diagnosis not present

## 2020-01-06 DIAGNOSIS — W57XXXA Bitten or stung by nonvenomous insect and other nonvenomous arthropods, initial encounter: Secondary | ICD-10-CM | POA: Diagnosis not present

## 2020-01-06 DIAGNOSIS — Z8619 Personal history of other infectious and parasitic diseases: Secondary | ICD-10-CM | POA: Diagnosis not present

## 2020-01-06 DIAGNOSIS — Z8719 Personal history of other diseases of the digestive system: Secondary | ICD-10-CM | POA: Diagnosis not present

## 2020-01-06 DIAGNOSIS — R5383 Other fatigue: Secondary | ICD-10-CM | POA: Diagnosis not present

## 2020-01-06 DIAGNOSIS — Z1321 Encounter for screening for nutritional disorder: Secondary | ICD-10-CM | POA: Diagnosis not present

## 2020-01-06 DIAGNOSIS — K219 Gastro-esophageal reflux disease without esophagitis: Secondary | ICD-10-CM | POA: Diagnosis not present

## 2020-01-06 DIAGNOSIS — R69 Illness, unspecified: Secondary | ICD-10-CM | POA: Diagnosis not present

## 2020-01-06 DIAGNOSIS — R519 Headache, unspecified: Secondary | ICD-10-CM | POA: Diagnosis not present

## 2020-01-11 ENCOUNTER — Ambulatory Visit: Payer: 59 | Attending: Internal Medicine

## 2020-01-11 DIAGNOSIS — Z23 Encounter for immunization: Secondary | ICD-10-CM

## 2020-01-11 NOTE — Progress Notes (Signed)
   Covid-19 Vaccination Clinic  Name:  George Proctor    MRN: [id number] DOB: 1956/06/02  01/11/2020  [name] was observed post Covid-19 immunization for 15 minutes without incident. He was provided with Vaccine Information Sheet and instruction to access the V-Safe system.   [name] was instructed to call 911 with any severe reactions post vaccine: Marland Kitchen Difficulty breathing  . Swelling of face and throat  . A fast heartbeat  . A bad rash all over body  . Dizziness and weakness   Immunizations Administered    Name Date Dose VIS Date Route   Pfizer COVID-19 Vaccine 01/11/2020 10:30 AM 0.3 mL 11/10/2018 Intramuscular   Manufacturer: [place]   Lot: [id number]   [place]: [id number]

## 2020-01-14 ENCOUNTER — Other Ambulatory Visit: Payer: Self-pay

## 2020-01-14 ENCOUNTER — Ambulatory Visit: Payer: 59 | Admitting: Physician Assistant

## 2020-01-14 ENCOUNTER — Encounter: Payer: Self-pay | Admitting: Physician Assistant

## 2020-01-14 VITALS — BP 121/77 | HR 118 | Temp 98.2°F | Ht 73.0 in | Wt 178.8 lb

## 2020-01-14 DIAGNOSIS — R000 Tachycardia, unspecified: Secondary | ICD-10-CM

## 2020-01-14 DIAGNOSIS — Z8249 Family history of ischemic heart disease and other diseases of the circulatory system: Secondary | ICD-10-CM

## 2020-01-14 DIAGNOSIS — R0600 Dyspnea, unspecified: Secondary | ICD-10-CM

## 2020-01-14 DIAGNOSIS — R5383 Other fatigue: Secondary | ICD-10-CM | POA: Diagnosis not present

## 2020-01-14 LAB — TSH: TSH: 1.87 u[IU]/mL (ref 0.450–4.500)

## 2020-01-14 NOTE — Progress Notes (Signed)
Cardiology Office Note:    Date:  01/16/2020   ID:  George Proctor, DOB 01/10/1956, MRN [id number]  PCP:  [name], MD  Cardiologist:  Pixie Casino, MD  Electrophysiologist:  None   Referring MD: [name], MD   Chief Complaint  Patient presents with  . Follow-up    seen for Dr. Debara Pickett    History of Present Illness:    George Proctor is a 64 y.o. male with a hx of ADD, anxiety and depression.  He has no prior history of hypertension, diabetes or hyperlipidemia.  Family history is significant for hypertension and heart disease in his father.  Previous coronary CT obtained on 10/23/2011 showed a coronary calcium score of 0, mild to moderate dilatation of the sinus of Valsalva, otherwise normal coronary arteries with right dominant system.  He was referred to cardiology service for evaluation of atypical chest pain and fatigue.  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  Since 2018, he has been having yearly echocardiogram. He was last seen by Dr. Debara Pickett on 04/05/2019 for atypical chest pain that was clearly noncardiac. Last echocardiogram obtained on 08/11/2019 showed EF 55 to 60%, grade 1 DD, mild to moderate aortic stenosis, moderate to dilatation of the ascending aorta measuring at 39 mm.  Patient presents today for follow-up.  He denies any obvious chest pain however he describe constant chest fullness which make it harder for him to take a deep breath.  He noticed it more so with exertion however it also occurs at rest as well.  He is still depressed and has not seen his psychiatrist in a long time.  On physical exam, I do not see any lower extremity edema, his lungs are clear.  He also  denies any orthopnea and PND.  His symptom is very vague.  On physical exam, his heart rate is elevated.  He denies any recent bleeding issue.  He does drink 2 mugs of coffee every morning.  I asked him to cut back on caffeine intake.  I did not start him on any beta-blocker due to fear of his untreated depression at this point.  I did recommend a TSH to make sure he does not have any hyperthyroidism.  We discussed various options, given the atypical nature of his symptoms, I recommended assessing his future cardiac risk using a coronary calcium scoring.  I do not think he can go through for coronary CT at this time as I do not think we can slow down the heart rate enough for the coronary CT.  If coronary calcium scoring came back abnormal, I will consider a Myoview.  Past Medical History:  Diagnosis Date  . Anxiety   . Depression   . Gait difficulty   . Memory loss     Past Surgical History:  Procedure Laterality Date  . ABDOMINAL SURGERY     Childhood  . BUNIONECTOMY      Current Medications: Current Meds  Medication Sig  . Ascorbic Acid (VITAMIN C PO) Take 2,000 mg by mouth.   . B Complex Vitamins (VITAMIN B COMPLEX PO) Take by mouth.  . doxycycline (VIBRA-TABS) 100 MG tablet Take 100 mg by mouth 2 (  two) times daily.  . fish oil-omega-3 fatty acids 1000 MG capsule Take 6 g by mouth daily.   . Multiple Vitamin (MULTIVITAMIN) capsule Take 1 capsule by mouth daily.  . pantoprazole (PROTONIX) 40 MG tablet   . testosterone cypionate (DEPOTESTOSTERONE CYPIONATE) 200 MG/ML injection SMARTSIG:0.5 Milliliter(s) IM Every 2 Weeks  . valACYclovir (VALTREX) 1000 MG tablet Take 1,000 mg by mouth 2 (two) times daily.     Allergies:   Omeprazole   Social History   Socioeconomic History  . Marital status: Married    Spouse name: Not on file  . Number of children: 0  . Years of education: Veterinary surgeon  . Highest education level: Not on file  Occupational History  . Occupation: Water engineer  Tobacco Use  . Smoking status: Never Smoker  . Smokeless tobacco: Never Used  Substance and Sexual Activity  . Alcohol use: No    Alcohol/week: 0.0 standard drinks    Comment: Less than one drink per week.  . Drug use: No  . Sexual activity: Not Currently  Other Topics Concern  . Not on file  Social History Narrative   Lives at home with wife.   Right-handed.   3 cups caffeine per day.   Social Determinants of Health   Financial Resource Strain:   . Difficulty of Paying Living Expenses:   Food Insecurity:   . Worried About Charity fundraiser in the Last Year:   . Arboriculturist in the Last Year:   Transportation Needs:   . Film/video editor (Medical):   Marland Kitchen Lack of Transportation (Non-Medical):   Physical Activity:   . Days of Exercise per Week:   . Minutes of Exercise per Session:   Stress:   . Feeling of Stress :   Social Connections:   . Frequency of Communication with Friends and Family:   . Frequency of Social Gatherings with Friends and Family:   . Attends Religious Services:   . Active Member of Clubs or Organizations:   . Attends Archivist Meetings:   Marland Kitchen Marital Status:      Family History: The patient's family history includes Aneurysm (age of onset: 59) in his mother; Bipolar disorder in his cousin; Depression in his cousin; Heart attack in his paternal grandfather; Heart disease in his father; Heart failure in his father; Hypertension in his father; Schizophrenia in his maternal uncle.  ROS:   Please see the history of present illness.     All other systems reviewed and are negative.  EKGs/Labs/Other Studies Reviewed:    The following studies were reviewed today:  Echo 08/11/2019 1. Left ventricular ejection fraction, by visual estimation, is 55 to  60%. The left ventricle has normal function. There is no left ventricular  hypertrophy.  2. Left ventricular diastolic parameters are consistent with Grade I    diastolic dysfunction (impaired relaxation).  3. The left ventricle has no regional wall motion abnormalities.  4. Global right ventricle has normal systolic function.The right  ventricular size is normal. No increase in right ventricular wall  thickness.  5. Left atrial size was normal.  6. Right atrial size was normal.  7. The mitral valve is normal in structure. No evidence of mitral valve  regurgitation. No evidence of mitral stenosis.  8. The tricuspid valve is normal in structure. Tricuspid valve  regurgitation is trivial.  9. The aortic valve is normal in structure. Aortic valve regurgitation is  not visualized. Mild to  moderate aortic valve stenosis.  10. The pulmonic valve was normal in structure. Pulmonic valve  regurgitation is not visualized.  11. There is mild dilatation of the ascending aorta measuring 39 mm.  12. Normal pulmonary artery systolic pressure.  13. The inferior vena cava is normal in size with greater than 50%  respiratory variability, suggesting right atrial pressure of 3 mmHg.   EKG:  EKG is ordered today.  The ekg ordered today demonstrates sinus tachycardia, without obvious ST-T wave changes.  Recent Labs: 01/14/2020: TSH 1.870  Recent Lipid Panel    Component Value Date/Time   CHOL 197 11/03/2007 1136   TRIG 119 11/03/2007 1136   HDL 37.8 (L) 11/03/2007 1136   CHOLHDL 5.2 CALC 11/03/2007 1136   VLDL 24 11/03/2007 1136   LDLCALC 135 (H) 11/03/2007 1136    Physical Exam:    VS:  BP 121/77   Pulse (!) 118   Temp 98.2 F (36.8 C) Comment: Forehead  Ht 6\' 1"  (1.854 m)   Wt 178 lb 12.8 oz (81.1 kg)   SpO2 97%   BMI 23.59 kg/m     Wt Readings from Last 3 Encounters:  01/14/20 178 lb 12.8 oz (81.1 kg)  04/05/19 185 lb (83.9 kg)  08/10/18 191 lb 9.6 oz (86.9 kg)     GEN:  Well nourished, well developed in no acute distress HEENT: Normal NECK: No JVD; No carotid bruits LYMPHATICS: No lymphadenopathy CARDIAC: RRR, no murmurs, rubs,  gallops RESPIRATORY:  Clear to auscultation without rales, wheezing or rhonchi  ABDOMEN: Soft, non-tender, non-distended MUSCULOSKELETAL:  No edema; No deformity  SKIN: Warm and dry NEUROLOGIC:  Alert and oriented x 3 PSYCHIATRIC:  Normal affect   ASSESSMENT:    1. Dyspnea, unspecified type   2. Family history of early CAD   3. Fatigue, unspecified type    PLAN:    In order of problems listed above:  1. Dyspnea: Recent echocardiogram obtained in December was normal.  He previously had a coronary CT obtained in 2013 that showed coronary calcium score of 0 and no coronary artery disease.  He describes a constant chest fullness which does not seems to correlate with degree of physical activity.  He is still very depressed and has not seen his psychiatrist in a long time.  I do not think he can go through with a regular coronary CT as I do not think I can slow his heart rate down enough.  However I did recommend another coronary calcium scoring, if coronary calcium score is still low, I would not recommend any further work-up.  He is aware that there is a $150 out-of-pocket cost for the study.  2. Fatigue: He has significant depression which I suspect is contributing to his fatigue.  I also recommended TSH as well to make sure there is no thyroid issue.  3. Sinus tachycardia: Heart rate was elevated even during the last office visit as well.  However subsequent echocardiogram was normal.  I wonder if this is related to anxiety issue.  At this time, I do not recommend any additional beta-blocker which may worsen his depression.  I would not consider a calcium channel blocker either unless there is a documented prove of persistent tachycardia even at home using methods such as a holter or 3-day heart monitor.   Medication Adjustments/Labs and Tests Ordered: Current medicines are reviewed at length with the patient today.  Concerns regarding medicines are outlined above.  Orders Placed This  Encounter  Procedures  .  CT CARDIAC SCORING  . TSH  . EKG 12-Lead   No orders of the defined types were placed in this encounter.   Patient Instructions  Medication Instructions:  Your physician recommends that you continue on your current medications as directed. Please refer to the Current Medication list given to you today.  *If you need a refill on your cardiac medications before your next appointment, please call your pharmacy*  Lab Work: Your physician recommends that you return for lab work TODAY:   TSH If you have labs (blood work) drawn today and your tests are completely normal, you will receive your results only by: Marland Kitchen MyChart Message (if you have MyChart) OR . A paper copy in the mail If you have any lab test that is abnormal or we need to change your treatment, we will call you to review the results.   Testing/Procedures: Almyra Deforest, PA-C has ordered a CT coronary calcium score. This test is done at [address]. This is $150 out of pocket.   Please schedule for 2-3 weeks   Coronary CalciumScan A coronary calcium scan is an imaging test used to look for deposits of calcium and other fatty materials (plaques) in the inner lining of the blood vessels of the heart (coronary arteries). These deposits of calcium and plaques can partly clog and narrow the coronary arteries without producing any symptoms or warning signs. This puts a person at risk for a heart attack. This test can detect these deposits before symptoms develop. Tell a health care provider about:  Any allergies you have.  All medicines you are taking, including vitamins, herbs, eye drops, creams, and over-the-counter medicines.  Any problems you or family members have had with anesthetic medicines.  Any blood disorders you have.  Any surgeries you have had.  Any medical conditions you have.  Whether you are pregnant or may be pregnant. What are the risks? Generally, this is a safe  procedure. However, problems may occur, including:  Harm to a pregnant woman and her unborn baby. This test involves the use of radiation. Radiation exposure can be dangerous to a pregnant woman and her unborn baby. If you are pregnant, you generally should not have this procedure done.  Slight increase in the risk of cancer. This is because of the radiation involved in the test. What happens before the procedure? No preparation is needed for this procedure. What happens during the procedure?  You will undress and remove any jewelry around your neck or chest.  You will put on a hospital gown.  Sticky electrodes will be placed on your chest. The electrodes will be connected to an electrocardiogram (ECG) machine to record a tracing of the electrical activity of your heart.  A CT scanner will take pictures of your heart. During this time, you will be asked to lie still and hold your breath for 2-3 seconds while a picture of your heart is being taken. The procedure may vary among health care providers and hospitals. What happens after the procedure?  You can get dressed.  You can return to your normal activities.  It is up to you to get the results of your test. Ask your health care provider, or the department that is doing the test, when your results will be ready. Summary  A coronary calcium scan is an imaging test used to look for deposits of calcium and other fatty materials (plaques) in the inner lining of the blood vessels of the heart (  coronary arteries).  Generally, this is a safe procedure. Tell your health care provider if you are pregnant or may be pregnant.  No preparation is needed for this procedure.  A CT scanner will take pictures of your heart.  You can return to your normal activities after the scan is done. This information is not intended to replace advice given to you by your health care provider. Make sure you discuss any questions you have with your health care  provider. Document Released: 02/29/2008 Document Revised: 07/22/2016 Document Reviewed: 07/22/2016 Elsevier Interactive Patient Education  2017 [place]: At [facility], you and your health needs are our priority.  As part of our continuing mission to provide you with exceptional heart care, we have created designated Provider Care Teams.  These Care Teams include your primary Cardiologist (physician) and Advanced Practice Providers (APPs -  Physician Assistants and Nurse Practitioners) who all work together to provide you with the care you need, when you need it.  Your next appointment:   4 week(s)  The format for your next appointment:   In Person  Provider:   Almyra Deforest, PA-C  Other Instructions      Signed, Almyra Deforest, [place]  01/16/2020 11:12 PM    [place]

## 2020-01-14 NOTE — Patient Instructions (Signed)
Medication Instructions:  Your physician recommends that you continue on your current medications as directed. Please refer to the Current Medication list given to you today.  *If you need a refill on your cardiac medications before your next appointment, please call your pharmacy*  Lab Work: Your physician recommends that you return for lab work TODAY:   TSH If you have labs (blood work) drawn today and your tests are completely normal, you will receive your results only by: Marland Kitchen MyChart Message (if you have MyChart) OR . A paper copy in the mail If you have any lab test that is abnormal or we need to change your treatment, we will call you to review the results.   Testing/Procedures: Almyra Deforest, PA-C has ordered a CT coronary calcium score. This test is done at [address]. This is $150 out of pocket.   Please schedule for 2-3 weeks   Coronary CalciumScan A coronary calcium scan is an imaging test used to look for deposits of calcium and other fatty materials (plaques) in the inner lining of the blood vessels of the heart (coronary arteries). These deposits of calcium and plaques can partly clog and narrow the coronary arteries without producing any symptoms or warning signs. This puts a person at risk for a heart attack. This test can detect these deposits before symptoms develop. Tell a health care provider about:  Any allergies you have.  All medicines you are taking, including vitamins, herbs, eye drops, creams, and over-the-counter medicines.  Any problems you or family members have had with anesthetic medicines.  Any blood disorders you have.  Any surgeries you have had.  Any medical conditions you have.  Whether you are pregnant or may be pregnant. What are the risks? Generally, this is a safe procedure. However, problems may occur, including:  Harm to a pregnant woman and her unborn baby. This test involves the use of radiation. Radiation exposure can  be dangerous to a pregnant woman and her unborn baby. If you are pregnant, you generally should not have this procedure done.  Slight increase in the risk of cancer. This is because of the radiation involved in the test. What happens before the procedure? No preparation is needed for this procedure. What happens during the procedure?  You will undress and remove any jewelry around your neck or chest.  You will put on a hospital gown.  Sticky electrodes will be placed on your chest. The electrodes will be connected to an electrocardiogram (ECG) machine to record a tracing of the electrical activity of your heart.  A CT scanner will take pictures of your heart. During this time, you will be asked to lie still and hold your breath for 2-3 seconds while a picture of your heart is being taken. The procedure may vary among health care providers and hospitals. What happens after the procedure?  You can get dressed.  You can return to your normal activities.  It is up to you to get the results of your test. Ask your health care provider, or the department that is doing the test, when your results will be ready. Summary  A coronary calcium scan is an imaging test used to look for deposits of calcium and other fatty materials (plaques) in the inner lining of the blood vessels of the heart (coronary arteries).  Generally, this is a safe procedure. Tell your health care provider if you are pregnant or may be pregnant.  No preparation is needed for  this procedure.  A CT scanner will take pictures of your heart.  You can return to your normal activities after the scan is done. This information is not intended to replace advice given to you by your health care provider. Make sure you discuss any questions you have with your health care provider. Document Released: 02/29/2008 Document Revised: 07/22/2016 Document Reviewed: 07/22/2016 Elsevier Interactive Patient Education  2017 [place]: At [facility], you and your health needs are our priority.  As part of our continuing mission to provide you with exceptional heart care, we have created designated Provider Care Teams.  These Care Teams include your primary Cardiologist (physician) and Advanced Practice Providers (APPs -  Physician Assistants and Nurse Practitioners) who all work together to provide you with the care you need, when you need it.  Your next appointment:   4 week(s)  The format for your next appointment:   In Person  Provider:   Almyra Deforest, PA-C  Other Instructions

## 2020-01-16 ENCOUNTER — Encounter: Payer: Self-pay | Admitting: Physician Assistant

## 2020-01-19 NOTE — Progress Notes (Signed)
TSH normal

## 2020-01-28 ENCOUNTER — Ambulatory Visit (INDEPENDENT_AMBULATORY_CARE_PROVIDER_SITE_OTHER)
Admission: RE | Admit: 2020-01-28 | Discharge: 2020-01-28 | Disposition: A | Discharge status: Home / Self Care | Payer: Self-pay | Source: Ambulatory Visit | Attending: Physician Assistant | Admitting: Physician Assistant

## 2020-01-28 ENCOUNTER — Other Ambulatory Visit: Payer: Self-pay

## 2020-01-28 DIAGNOSIS — Z8249 Family history of ischemic heart disease and other diseases of the circulatory system: Secondary | ICD-10-CM

## 2020-01-31 ENCOUNTER — Telehealth: Payer: Self-pay | Admitting: *Deleted

## 2020-01-31 DIAGNOSIS — Z8249 Family history of ischemic heart disease and other diseases of the circulatory system: Secondary | ICD-10-CM

## 2020-01-31 DIAGNOSIS — R0600 Dyspnea, unspecified: Secondary | ICD-10-CM

## 2020-01-31 DIAGNOSIS — R931 Abnormal findings on diagnostic imaging of heart and coronary circulation: Secondary | ICD-10-CM

## 2020-01-31 NOTE — Telephone Encounter (Signed)
Spoke with patient regarding CT He is willing to do [employer] sent to [place] regarding what type of myoview

## 2020-02-03 NOTE — Telephone Encounter (Signed)
Lexiscan myoview please

## 2020-02-03 NOTE — Telephone Encounter (Signed)
Message sent to scheduling to arrange  

## 2020-02-04 ENCOUNTER — Telehealth: Payer: Self-pay | Admitting: *Deleted

## 2020-02-04 NOTE — Addendum Note (Signed)
Addended by: Alvina Filbert B on: 02/04/2020 10:25 AM   Modules accepted: Orders

## 2020-02-04 NOTE — Telephone Encounter (Signed)
Left message for patient to call and schedule Lexiscan myview ordered by Almyra Deforest, PA

## 2020-02-08 ENCOUNTER — Encounter (HOSPITAL_COMMUNITY): Payer: Self-pay

## 2020-02-09 ENCOUNTER — Telehealth (HOSPITAL_COMMUNITY): Payer: Self-pay

## 2020-02-10 ENCOUNTER — Ambulatory Visit (HOSPITAL_COMMUNITY): Payer: 59 | Attending: Cardiology

## 2020-02-10 ENCOUNTER — Other Ambulatory Visit: Payer: Self-pay

## 2020-02-10 DIAGNOSIS — Z8249 Family history of ischemic heart disease and other diseases of the circulatory system: Secondary | ICD-10-CM | POA: Insufficient documentation

## 2020-02-10 DIAGNOSIS — R931 Abnormal findings on diagnostic imaging of heart and coronary circulation: Secondary | ICD-10-CM | POA: Diagnosis not present

## 2020-02-10 DIAGNOSIS — R0600 Dyspnea, unspecified: Secondary | ICD-10-CM | POA: Diagnosis not present

## 2020-02-10 LAB — MYOCARDIAL PERFUSION IMAGING
LV dias vol: 116 mL (ref 62–150)
LV sys vol: 45 mL
Peak HR: 116 {beats}/min
Rest HR: 81 {beats}/min
SDS: 0
SRS: 0
SSS: 0
TID: 0.99

## 2020-02-10 MED ORDER — TECHNETIUM TC 99M TETROFOSMIN IV KIT
30.9000 | PACK | Freq: Once | INTRAVENOUS | Status: AC | PRN
  Administered 2020-02-10: 30.9 via INTRAVENOUS
  Filled 2020-02-10: qty 31

## 2020-02-10 MED ORDER — REGADENOSON 0.4 MG/5ML IV SOLN
0.4000 mg | Freq: Once | INTRAVENOUS | Status: AC
  Administered 2020-02-10: 0.4 mg via INTRAVENOUS

## 2020-02-10 MED ORDER — TECHNETIUM TC 99M TETROFOSMIN IV KIT
10.3000 | PACK | Freq: Once | INTRAVENOUS | Status: AC | PRN
  Administered 2020-02-10: 10.3 via INTRAVENOUS
  Filled 2020-02-10: qty 11

## 2020-02-11 ENCOUNTER — Encounter: Payer: Self-pay | Admitting: Physician Assistant

## 2020-02-11 ENCOUNTER — Ambulatory Visit: Payer: 59 | Admitting: Physician Assistant

## 2020-02-11 VITALS — BP 130/71 | HR 96 | Temp 97.5°F | Ht 73.0 in | Wt 178.4 lb

## 2020-02-11 DIAGNOSIS — R0789 Other chest pain: Secondary | ICD-10-CM

## 2020-02-11 DIAGNOSIS — R69 Illness, unspecified: Secondary | ICD-10-CM | POA: Diagnosis not present

## 2020-02-11 DIAGNOSIS — F339 Major depressive disorder, recurrent, unspecified: Secondary | ICD-10-CM

## 2020-02-11 DIAGNOSIS — I712 Thoracic aortic aneurysm, without rupture, unspecified: Secondary | ICD-10-CM

## 2020-02-11 NOTE — Patient Instructions (Signed)
Medication Instructions:  The current medical regimen is effective;  continue present plan and medications as directed. Please refer to the Current Medication list given to you today. *If you need a refill on your cardiac medications before your next appointment, please call your pharmacy*  Follow-Up: Your next appointment:  6 month(s) Please call our office 2 months in advance to schedule this appointment In Person with K. Mali Hilty, MD  At [facility], you and your health needs are our priority.  As part of our continuing mission to provide you with exceptional heart care, we have created designated Provider Care Teams.  These Care Teams include your primary Cardiologist (physician) and Advanced Practice Providers (APPs -  Physician Assistants and Nurse Practitioners) who all work together to provide you with the care you need, when you need it.

## 2020-02-11 NOTE — Progress Notes (Signed)
Cardiology Office Note:    Date:  02/13/2020   ID:  George Proctor, DOB Mar 09, 1956, MRN [id number]  PCP:  George Pretty, MD  Cardiologist:  George Casino, MD  Electrophysiologist:  None   Referring MD: George Pretty, MD   Chief Complaint  Patient presents with  . Follow-up    seen for Dr. Debara Proctor.    History of Present Illness:    George Proctor is a 64 y.o. male with a hx of ADD, anxiety and depression.  He has no prior history of hypertension, diabetes or hyperlipidemia.  Family history is significant for hypertension and heart disease in his father.  Previous coronary CT obtained on 10/23/2011 showed a coronary calcium score of 0, mild to moderate dilatation of the sinus of Valsalva, otherwise normal coronary arteries with right dominant system.  He was referred to cardiology service for evaluation of atypical chest pain and fatigue.  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  Since 2018, he has been having yearly echocardiogram. He was last seen by Dr. Debara Proctor on 04/05/2019 for atypical chest pain that was clearly noncardiac. Last echocardiogram obtained on 08/11/2019 showed EF 55 to 60%, grade 1 DD, mild to moderate aortic stenosis, moderate to dilatation of the ascending aorta measuring at 39 mm.  I last saw the patient on 12/14/2019 at which time he was having some chest tightness with exertion.  He was also feel depressed and has not seen his psychiatrist in a long time.  His heart rate was elevated, I asked him to cut back on caffeine intake.  I did not start him on beta-blocker due to fear of his untreated depression.  I recommend him outpatient Myoview which was performed on 02/10/2020 and was  normal.  I talked with George Proctor today regarding his recent stress test.  The stress test shows normal ejection fraction and no ischemia or infarction.  He will occasionally still have some chest tightness, however this is somewhat atypical.  He is still depressed I suspect his chest tightness may be related to depression.  I recommended he see a psychiatrist or psychologist in this case.  His depression is clearly untreated and he does not have much enthusiasm for any activity at this time.  I recommended 21-month follow-up with Dr. Debara Proctor.  Past Medical History:  Diagnosis Date  . Anxiety   . Depression   . Gait difficulty   . Memory loss     Past Surgical History:  Procedure Laterality Date  . ABDOMINAL SURGERY     Childhood  . BUNIONECTOMY      Current Medications: Current Meds  Medication Sig  . Ascorbic Acid (VITAMIN C PO) Take 2,000 mg by mouth.   . B Complex Vitamins (VITAMIN B COMPLEX PO) Take by mouth.  . doxycycline (VIBRA-TABS) 100 MG tablet Take 100 mg by mouth 2 (two) times daily.  . fish oil-omega-3 fatty acids 1000 MG capsule Take 6 g by mouth daily.   . Multiple Vitamin (MULTIVITAMIN) capsule Take 1 capsule by mouth daily.  . pantoprazole (PROTONIX) 40 MG tablet   . testosterone cypionate (DEPOTESTOSTERONE CYPIONATE) 200 MG/ML injection SMARTSIG:0.5 Milliliter(s) IM Every 2 Weeks  . [DISCONTINUED] valACYclovir (  VALTREX) 1000 MG tablet Take 1,000 mg by mouth 2 (two) times daily.  . [DISCONTINUED] VITAMIN D PO Take by mouth.     Allergies:   Omeprazole   Social History   Socioeconomic History  . Marital status: Married    Spouse name: Not on file  . Number of children: 0  . Years of education: Veterinary surgeon  . Highest education level: Not on file  Occupational History  . Occupation: Scientist, research (physical sciences)  Tobacco Use  . Smoking status: Never Smoker  . Smokeless tobacco: Never Used  Substance and Sexual Activity  . Alcohol use: No    Alcohol/week:  0.0 standard drinks    Comment: Less than one drink per week.  . Drug use: No  . Sexual activity: Not Currently  Other Topics Concern  . Not on file  Social History Narrative   Lives at home with wife.   Right-handed.   3 cups caffeine per day.   Social Determinants of Health   Financial Resource Strain:   . Difficulty of Paying Living Expenses:   Food Insecurity:   . Worried About Charity fundraiser in the Last Year:   . Arboriculturist in the Last Year:   Transportation Needs:   . Film/video editor (Medical):   Marland Kitchen Lack of Transportation (Non-Medical):   Physical Activity:   . Days of Exercise per Week:   . Minutes of Exercise per Session:   Stress:   . Feeling of Stress :   Social Connections:   . Frequency of Communication with Friends and Family:   . Frequency of Social Gatherings with Friends and Family:   . Attends Religious Services:   . Active Member of Clubs or Organizations:   . Attends Archivist Meetings:   Marland Kitchen Marital Status:      Family History: The patient's family history includes Aneurysm (age of onset: 72) in his mother; Bipolar disorder in his cousin; Depression in his cousin; Heart attack in his paternal grandfather; Heart disease in his father; Heart failure in his father; Hypertension in his father; Schizophrenia in his maternal uncle.  ROS:   Please see the history of present illness.     All other systems reviewed and are negative.  EKGs/Labs/Other Studies Reviewed:    The following studies were reviewed today:  Myoview 02/10/2020  The left ventricular ejection fraction is normal (55-65%).  Nuclear stress EF: 61%.  There was no ST segment deviation noted during stress.  The study is normal.  This is a low risk study.   Normal stress nuclear study with no ischemia or infarction.  Gated ejection fraction 61% with normal wall motion  EKG:  EKG is not ordered today.   Recent Labs: 01/14/2020: TSH 1.870  Recent Lipid  Panel    Component Value Date/Time   CHOL 197 11/03/2007 1136   TRIG 119 11/03/2007 1136   HDL 37.8 (L) 11/03/2007 1136   CHOLHDL 5.2 CALC 11/03/2007 1136   VLDL 24 11/03/2007 1136   LDLCALC 135 (H) 11/03/2007 1136    Physical Exam:    VS:  BP 130/71   Pulse 96   Temp (!) 97.5 F (36.4 C)   Ht 6\' 1"  (1.854 m)   Wt 178 lb 6.4 oz (80.9 kg)   SpO2 95%   BMI 23.54 kg/m     Wt Readings from Last 3 Encounters:  02/11/20 178 lb 6.4 oz (80.9 kg)  02/10/20 178 lb (80.7 kg)  01/14/20 178 lb 12.8 oz (81.1 kg)     GEN:  Well nourished, well developed in no acute distress HEENT: Normal NECK: No JVD; No carotid bruits LYMPHATICS: No lymphadenopathy CARDIAC: RRR, no murmurs, rubs, gallops RESPIRATORY:  Clear to auscultation without rales, wheezing or rhonchi  ABDOMEN: Soft, non-tender, non-distended MUSCULOSKELETAL:  No edema; No deformity  SKIN: Warm and dry NEUROLOGIC:  Alert and oriented x 3 PSYCHIATRIC:  Normal affect   ASSESSMENT:    1. Atypical chest pain   2. Recurrent major depression resistant to treatment ([place])   3. Thoracic aortic aneurysm without rupture ([place])    PLAN:    In order of problems listed above:  1. Atypical chest pain: Recent Myoview was normal.  I suspect his recurrent chest pain may be related to depression.  2. Recurrent depression: I advised him to seek attention with a psychologist or psychiatrist.  His depression unfortunately is untreated at this point.  He does not seem to get much enjoyment of every day activity.  3. Thoracic aortic aneurysm: Last echocardiogram in 2020 showed dilated aorta measuring at 3.9 cm.  Consider either repeat echo or CTA of the chest after November 2021.   Medication Adjustments/Labs and Tests Ordered: Current medicines are reviewed at length with the patient today.  Concerns regarding medicines are outlined above.  No orders of the defined types were placed in this encounter.  No orders of the defined types  were placed in this encounter.   Patient Instructions  Medication Instructions:  The current medical regimen is effective;  continue present plan and medications as directed. Please refer to the Current Medication list given to you today. *If you need a refill on your cardiac medications before your next appointment, please call your pharmacy*  Follow-Up: Your next appointment:  6 month(s) Please call our office 2 months in advance to schedule this appointment In Person with K. Mali Hilty, MD  At [facility], you and your health needs are our priority.  As part of our continuing mission to provide you with exceptional heart care, we have created designated Provider Care Teams.  These Care Teams include your primary Cardiologist (physician) and Advanced Practice Providers (APPs -  Physician Assistants and Nurse Practitioners) who all work together to provide you with the care you need, when you need it.      Hilbert Corrigan, [place]  02/13/2020 11:28 PM    [facility]

## 2020-02-11 NOTE — Progress Notes (Signed)
Stress test was reassuring, normal pumping function of heart, no sign of significant reversible blockage

## 2020-02-13 ENCOUNTER — Encounter: Payer: Self-pay | Admitting: Physician Assistant

## 2020-02-24 ENCOUNTER — Telehealth: Payer: Self-pay | Admitting: Internal Medicine

## 2020-02-24 NOTE — Telephone Encounter (Signed)
I attempted to contact patient 01/24/20 to schedule follow up visit from patients recall list with Dr. Debara Pickett. The patient didn't answer so I left message for patient to return call in order to get that appointment scheduled.

## 2020-03-08 DIAGNOSIS — Z8719 Personal history of other diseases of the digestive system: Secondary | ICD-10-CM | POA: Diagnosis not present

## 2020-03-08 DIAGNOSIS — Z1321 Encounter for screening for nutritional disorder: Secondary | ICD-10-CM | POA: Diagnosis not present

## 2020-03-08 DIAGNOSIS — E291 Testicular hypofunction: Secondary | ICD-10-CM | POA: Diagnosis not present

## 2020-03-08 DIAGNOSIS — R519 Headache, unspecified: Secondary | ICD-10-CM | POA: Diagnosis not present

## 2020-03-08 DIAGNOSIS — K219 Gastro-esophageal reflux disease without esophagitis: Secondary | ICD-10-CM | POA: Diagnosis not present

## 2020-03-08 DIAGNOSIS — Z8619 Personal history of other infectious and parasitic diseases: Secondary | ICD-10-CM | POA: Diagnosis not present

## 2020-03-08 DIAGNOSIS — E538 Deficiency of other specified B group vitamins: Secondary | ICD-10-CM | POA: Diagnosis not present

## 2020-03-08 DIAGNOSIS — R69 Illness, unspecified: Secondary | ICD-10-CM | POA: Diagnosis not present

## 2020-03-08 DIAGNOSIS — E559 Vitamin D deficiency, unspecified: Secondary | ICD-10-CM | POA: Diagnosis not present

## 2020-03-08 DIAGNOSIS — W57XXXA Bitten or stung by nonvenomous insect and other nonvenomous arthropods, initial encounter: Secondary | ICD-10-CM | POA: Diagnosis not present

## 2020-03-08 DIAGNOSIS — R5383 Other fatigue: Secondary | ICD-10-CM | POA: Diagnosis not present

## 2020-03-13 DIAGNOSIS — R69 Illness, unspecified: Secondary | ICD-10-CM | POA: Diagnosis not present

## 2020-03-21 DIAGNOSIS — R69 Illness, unspecified: Secondary | ICD-10-CM | POA: Diagnosis not present

## 2020-03-22 DIAGNOSIS — Z0000 Encounter for general adult medical examination without abnormal findings: Secondary | ICD-10-CM | POA: Diagnosis not present

## 2020-03-22 DIAGNOSIS — Z125 Encounter for screening for malignant neoplasm of prostate: Secondary | ICD-10-CM | POA: Diagnosis not present

## 2020-03-28 DIAGNOSIS — R69 Illness, unspecified: Secondary | ICD-10-CM | POA: Diagnosis not present

## 2020-03-29 DIAGNOSIS — R69 Illness, unspecified: Secondary | ICD-10-CM | POA: Diagnosis not present

## 2020-03-29 DIAGNOSIS — Z0000 Encounter for general adult medical examination without abnormal findings: Secondary | ICD-10-CM | POA: Diagnosis not present

## 2020-03-29 DIAGNOSIS — E291 Testicular hypofunction: Secondary | ICD-10-CM | POA: Diagnosis not present

## 2020-03-29 DIAGNOSIS — K219 Gastro-esophageal reflux disease without esophagitis: Secondary | ICD-10-CM | POA: Diagnosis not present

## 2020-03-29 DIAGNOSIS — K429 Umbilical hernia without obstruction or gangrene: Secondary | ICD-10-CM | POA: Diagnosis not present

## 2020-04-05 DIAGNOSIS — R69 Illness, unspecified: Secondary | ICD-10-CM | POA: Diagnosis not present

## 2020-04-05 DIAGNOSIS — F900 Attention-deficit hyperactivity disorder, predominantly inattentive type: Secondary | ICD-10-CM | POA: Diagnosis not present

## 2020-04-12 DIAGNOSIS — F900 Attention-deficit hyperactivity disorder, predominantly inattentive type: Secondary | ICD-10-CM | POA: Diagnosis not present

## 2020-04-12 DIAGNOSIS — F411 Generalized anxiety disorder: Secondary | ICD-10-CM | POA: Diagnosis not present

## 2020-04-12 DIAGNOSIS — K429 Umbilical hernia without obstruction or gangrene: Secondary | ICD-10-CM | POA: Diagnosis not present

## 2020-04-12 DIAGNOSIS — R69 Illness, unspecified: Secondary | ICD-10-CM | POA: Diagnosis not present

## 2020-04-19 ENCOUNTER — Ambulatory Visit (INDEPENDENT_AMBULATORY_CARE_PROVIDER_SITE_OTHER): Payer: 59

## 2020-04-19 ENCOUNTER — Ambulatory Visit: Payer: 59 | Admitting: Family Medicine

## 2020-04-19 ENCOUNTER — Other Ambulatory Visit: Payer: Self-pay

## 2020-04-19 ENCOUNTER — Encounter: Payer: Self-pay | Admitting: Family Medicine

## 2020-04-19 ENCOUNTER — Ambulatory Visit: Payer: Self-pay

## 2020-04-19 VITALS — BP 118/84 | HR 90 | Ht 73.0 in | Wt 183.0 lb

## 2020-04-19 DIAGNOSIS — M25511 Pain in right shoulder: Secondary | ICD-10-CM | POA: Diagnosis not present

## 2020-04-19 DIAGNOSIS — M19011 Primary osteoarthritis, right shoulder: Secondary | ICD-10-CM | POA: Diagnosis not present

## 2020-04-19 NOTE — Patient Instructions (Signed)
Thank you for coming in today. I think you have shoulder impingement and possibly biceps tendonitis.  Plan for PT.  Also ok to try to use voltaren gel over the counter up to 4x daily.  Get xray today.  Recheck in 4-6 weeks.    Shoulder Impingement Syndrome Rehab Ask your health care provider which exercises are safe for you. Do exercises exactly as told by your health care provider and adjust them as directed. It is normal to feel mild stretching, pulling, tightness, or discomfort as you do these exercises. Stop right away if you feel sudden pain or your pain gets worse. Do not begin these exercises until told by your health care provider. Stretching and range-of-motion exercise This exercise warms up your muscles and joints and improves the movement and flexibility of your shoulder. This exercise also helps to relieve pain and stiffness. Passive horizontal adduction In passive adduction, you use your other hand to move the injured arm toward your body. The injured arm does not move on its own. In this movement, your arm is moved across your body in the horizontal plane (horizontal adduction). 1. Sit or stand and pull your left / right elbow across your chest, toward your other shoulder. Stop when you feel a gentle stretch in the back of your shoulder and upper arm. ? Keep your arm at shoulder height. ? Keep your arm as close to your body as you comfortably can. 2. Hold for __________ seconds. 3. Slowly return to the starting position. Repeat __________ times. Complete this exercise __________ times a day. Strengthening exercises These exercises build strength and endurance in your shoulder. Endurance is the ability to use your muscles for a long time, even after they get tired. External rotation, isometric This is an exercise in which you press the back of your wrist against a door frame without moving your shoulder joint (isometric). 1. Stand or sit in a doorway, facing the door  frame. 2. Bend your left / right elbow and place the back of your wrist against the door frame. Only the back of your wrist should be touching the frame. Keep your upper arm at your side. 3. Gently press your wrist against the door frame, as if you are trying to push your arm away from your abdomen (external rotation). Press as hard as you are able without pain. ? Avoid shrugging your shoulder while you press your wrist against the door frame. Keep your shoulder blade tucked down toward the middle of your back. 4. Hold for __________ seconds. 5. Slowly release the tension, and relax your muscles completely before you repeat the exercise. Repeat __________ times. Complete this exercise __________ times a day. Internal rotation, isometric This is an exercise in which you press your palm against a door frame without moving your shoulder joint (isometric). 1. Stand or sit in a doorway, facing the door frame. 2. Bend your left / right elbow and place the palm of your hand against the door frame. Only your palm should be touching the frame. Keep your upper arm at your side. 3. Gently press your hand against the door frame, as if you are trying to push your arm toward your abdomen (internal rotation). Press as hard as you are able without pain. ? Avoid shrugging your shoulder while you press your hand against the door frame. Keep your shoulder blade tucked down toward the middle of your back. 4. Hold for __________ seconds. 5. Slowly release the tension, and relax your muscles completely  before you repeat the exercise. Repeat __________ times. Complete this exercise __________ times a day. Scapular protraction, supine  1. Lie on your back on a firm surface (supine position). Hold a __________ weight in your left / right hand. 2. Raise your left / right arm straight into the air so your hand is directly above your shoulder joint. 3. Push the weight into the air so your shoulder (scapula) lifts off the  surface that you are lying on. The scapula will push up or forward (protraction). Do not move your head, neck, or back. 4. Hold for __________ seconds. 5. Slowly return to the starting position. Let your muscles relax completely before you repeat this exercise. Repeat __________ times. Complete this exercise __________ times a day. Scapular retraction  1. Sit in a stable chair without armrests, or stand up. 2. Secure an exercise band to a stable object in front of you so the band is at shoulder height. 3. Hold one end of the exercise band in each hand. Your palms should face down. 4. Squeeze your shoulder blades together (retraction) and move your elbows slightly behind you. Do not shrug your shoulders upward while you do this. 5. Hold for __________ seconds. 6. Slowly return to the starting position. Repeat __________ times. Complete this exercise __________ times a day. Shoulder extension  1. Sit in a stable chair without armrests, or stand up. 2. Secure an exercise band to a stable object in front of you so the band is above shoulder height. 3. Hold one end of the exercise band in each hand. 4. Straighten your elbows and lift your hands up to shoulder height. 5. Squeeze your shoulder blades together and pull your hands down to the sides of your thighs (extension). Stop when your hands are straight down by your sides. Do not let your hands go behind your body. 6. Hold for __________ seconds. 7. Slowly return to the starting position. Repeat __________ times. Complete this exercise __________ times a day. This information is not intended to replace advice given to you by your health care provider. Make sure you discuss any questions you have with your health care provider. Document Revised: 12/25/2018 Document Reviewed: 09/28/2018 Elsevier Patient Education  [place].

## 2020-04-19 NOTE — Progress Notes (Signed)
Subjective:    CC: R shoulder pain  I, Judy Pimple, am serving as a scribe for Dr. Lynne Leader.  HPI: Pt is a 64 y/o male presenting w/ c/o R shoulder pain and decreased ROM for approximately 5 months.  He locates his pain to front of shoulder. Describes pain as dull.  He denies any injury.  He thinks the pain may have occurred after Pilates but cannot recall specific incident.  No radiating pain weakness or numbness.  Radiating pain: down the right arm to bicep R shoulder mechanical symptoms: no Aggravating factors: certain movements like putting pants on  Treatments tried: ibuprofen, aleve, tylenol   Pertinent review of Systems: No fevers or chills  Relevant historical information: Depression chest pain anxiety   Objective:    Vitals:   04/19/20 1545  BP: 118/84  Pulse: 90  SpO2: 95%   General: Well Developed, well nourished, and in no acute distress.   MSK: Right shoulder normal-appearing Normal motion. Not particular tender to palpation. Intact strength. Negative Hawkins and Neer's test. Mildly positive Yergason's and speeds test.  Pulses cap refill and sensation are intact distally.  Lab and Radiology Results  X-ray images right shoulder obtained today personally and independently reviewed No acute fractures AC DJD. Await formal radiology review  Diagnostic Limited MSK Ultrasound of: Right shoulder Biceps tendon enlarged with linear split tear appearance proximal biceps tendon.  No full-thickness tear. Subscapularis tendon irregular footplate without full-thickness tear. Supraspinatus tendon intact with increased subacromial bursa thickness. Infraspinatus tendon normal-appearing AC joint degenerative with effusion. Impression: Supple biceps tendinitis subacromial bursitis tendinopathy subscapularis tendon    Impression and Recommendations:    Assessment and Plan: 64 y.o. male with right shoulder pain ongoing for months.  Exam and ultrasound  largely normal with some biceps tendinopathy and rotator cuff tendinopathy changes.  Plan for trial of physical therapy.  Recheck 4 to 6 weeks.Marland Kitchen  PDMP not reviewed this encounter. Orders Placed This Encounter  Procedures  . [place] LIMITED JOINT SPACE STRUCTURES UP RIGHT    Standing Status:   Future    Number of Occurrences:   1    Standing Expiration Date:   04/19/2021    Order Specific Question:   Reason for Exam (SYMPTOM  OR DIAGNOSIS REQUIRED)    Answer:   right shoulder pain    Order Specific Question:   Preferred imaging location?    Answer:   [place]  . DG Shoulder Right    Standing Status:   Future    Number of Occurrences:   1    Standing Expiration Date:   04/19/2021    Order Specific Question:   Reason for Exam (SYMPTOM  OR DIAGNOSIS REQUIRED)    Answer:   eval shoulder pain    Order Specific Question:   Preferred imaging location?    Answer:   [facility]    Order Specific Question:   Radiology Contrast Protocol - do NOT remove file path    Answer:   \\[email or web address]  . Ambulatory referral to Physical Therapy    Referral Priority:   Routine    Referral Type:   Physical Medicine    Referral Reason:   Specialty Services Required    Requested Specialty:   Physical Therapy   No orders of the defined types were placed in this encounter.   Discussed warning signs or symptoms. Please see discharge instructions. Patient expresses understanding.   The above documentation has been reviewed and  is accurate and complete Lynne Leader, M.D.

## 2020-04-20 DIAGNOSIS — R69 Illness, unspecified: Secondary | ICD-10-CM | POA: Diagnosis not present

## 2020-04-20 DIAGNOSIS — F900 Attention-deficit hyperactivity disorder, predominantly inattentive type: Secondary | ICD-10-CM | POA: Diagnosis not present

## 2020-04-20 DIAGNOSIS — F411 Generalized anxiety disorder: Secondary | ICD-10-CM | POA: Diagnosis not present

## 2020-04-20 NOTE — Progress Notes (Signed)
Right shoulder x-ray shows a little bit of arthritis in the small joint at the top of the shoulder (the [facility] joint) otherwise looks pretty normal.

## 2020-05-01 DIAGNOSIS — F411 Generalized anxiety disorder: Secondary | ICD-10-CM | POA: Diagnosis not present

## 2020-05-01 DIAGNOSIS — R69 Illness, unspecified: Secondary | ICD-10-CM | POA: Diagnosis not present

## 2020-05-01 DIAGNOSIS — F900 Attention-deficit hyperactivity disorder, predominantly inattentive type: Secondary | ICD-10-CM | POA: Diagnosis not present

## 2020-05-04 DIAGNOSIS — R69 Illness, unspecified: Secondary | ICD-10-CM | POA: Diagnosis not present

## 2020-05-08 DIAGNOSIS — F411 Generalized anxiety disorder: Secondary | ICD-10-CM | POA: Diagnosis not present

## 2020-05-08 DIAGNOSIS — R69 Illness, unspecified: Secondary | ICD-10-CM | POA: Diagnosis not present

## 2020-05-08 DIAGNOSIS — F900 Attention-deficit hyperactivity disorder, predominantly inattentive type: Secondary | ICD-10-CM | POA: Diagnosis not present

## 2020-05-09 ENCOUNTER — Other Ambulatory Visit: Payer: Self-pay

## 2020-05-09 ENCOUNTER — Encounter: Payer: Self-pay | Admitting: Physical Therapy

## 2020-05-09 ENCOUNTER — Ambulatory Visit: Payer: 59 | Admitting: Physical Therapy

## 2020-05-09 DIAGNOSIS — M25511 Pain in right shoulder: Secondary | ICD-10-CM | POA: Diagnosis not present

## 2020-05-09 DIAGNOSIS — G8929 Other chronic pain: Secondary | ICD-10-CM | POA: Diagnosis not present

## 2020-05-09 DIAGNOSIS — M6281 Muscle weakness (generalized): Secondary | ICD-10-CM | POA: Diagnosis not present

## 2020-05-09 DIAGNOSIS — R600 Localized edema: Secondary | ICD-10-CM

## 2020-05-09 NOTE — Patient Instructions (Signed)
Access Code: [email or web address] URL: [email or web address] Date: 05/09/2020 Prepared by: Elsie Ra  Exercises Standing Shoulder Posterior Capsule Stretch - 2 x daily - 6 x weekly - 1 sets - 3 reps - 20 hold Standing Shoulder Flexion Wall Slide - 2 x daily - 6 x weekly - 1 sets - 10 reps - 10 hold Sleeper Stretch - 2 x daily - 6 x weekly - 1 sets - 10 reps - 10 hold Prone Single Arm Shoulder Y - 2 x daily - 6 x weekly - 2-3 sets - 10 reps Prone Shoulder Horizontal Abduction - 2 x daily - 3-4 x weekly - 10 reps - 2-3 sets - 3 sec hold Shoulder External Rotation with Anchored Resistance - 2 x daily - 6 x weekly - 10 reps - 2-3 sets Shoulder Internal Rotation with Resistance - 2 x daily - 6 x weekly - 10 reps - 2-3 sets

## 2020-05-09 NOTE — Therapy (Signed)
[place], [place], [address] Phone: [phone]   Fax:  [phone]  Physical Therapy Evaluation  Patient Details  Name: George Proctor MRN: [id number] Date of Birth: Aug 01, 1956 Referring Provider (PT): Gregor Hams, MD   Encounter Date: 05/09/2020   PT End of Session - 05/09/20 1547    Visit Number 1    Number of Visits 12    Date for PT Re-Evaluation 06/20/20    Authorization Type Aetna    PT Start Time 1347    PT Stop Time 1434    PT Time Calculation (min) 47 min    Activity Tolerance Patient tolerated treatment well    Behavior During Therapy [facility] for tasks assessed/performed           Past Medical History:  Diagnosis Date  . Anxiety   . Depression   . Gait difficulty   . Memory loss     Past Surgical History:  Procedure Laterality Date  . ABDOMINAL SURGERY     Childhood  . BUNIONECTOMY      There were no vitals filed for this visit.    Subjective Assessment - 05/09/20 1352    Subjective Pt is a 64 y/o male presenting w/ c/o R shoulder pain and decreased ROM for approximately 5 months.  He locates his pain to front of shoulder. Describes pain as dull.  He denies any injury.  He thinks the pain may have occurred after Pilates but cannot recall specific incident.  No radiating pain weakness or numbness. He does relay it is slowly getting better however    Diagnostic tests MSK US Impression: "Supple biceps tendinitis subacromial bursitis tendinopathy subscapularis tendon"    Patient Stated Goals reduce pain    Currently in Pain? Yes    Pain Score 3     Pain Location Shoulder    Pain Orientation Right    Pain Descriptors / Indicators Aching    Pain Type Chronic pain    Pain Radiating Towards down to Rt bicep but denies any N/T    Pain Onset More than a month ago    Pain Frequency Intermittent    Aggravating Factors  moving into extension or IR such as putting on his pants or reaching behind his  back    Pain Relieving Factors rest    Multiple Pain Sites No              [facility] PT Assessment - 05/09/20 0001      Assessment   Medical Diagnosis M25.511 (ICD-10-CM) - Acute pain of right shoulder    Referring Provider (PT) Gregor Hams, MD    Onset Date/Surgical Date --   5 month onset of pain   Hand Dominance Right    Next MD Visit PRN    Prior Therapy none      Precautions   Precautions None      Restrictions   Weight Bearing Restrictions No      Balance Screen   Has the patient fallen in the past 6 months No    Has the patient had a decrease in activity level because of a fear of falling?  No    Is the patient reluctant to leave their home because of a fear of falling?  No      Home Ecologist residence      Prior Function   Level of Independence Independent    Vocation Retired  Leisure canoe      Cognition   Overall Cognitive Status Within Functional Limits for tasks assessed      Observation/Other Assessments-Edema    Edema --   mild edema in Rt shoulder     Sensation   Light Touch Appears Intact      ROM / Strength   AROM / PROM / Strength AROM;PROM;Strength      AROM   AROM Assessment Site Shoulder    Right/Left Shoulder Right;Left    Right Shoulder Flexion 162 Degrees    Right Shoulder ABduction --   WNL   Right Shoulder Internal Rotation --   L1 behind back   Right Shoulder External Rotation --   WNL   Left Shoulder Flexion 165 Degrees    Left Shoulder ABduction --   WNL   Left Shoulder Internal Rotation --   T10   Left Shoulder External Rotation --   WNL     Strength   Strength Assessment Site Shoulder    Right/Left Shoulder Right;Left    Right Shoulder Flexion 4+/5    Right Shoulder Extension 4/5    Right Shoulder ABduction 4-/5    Right Shoulder Internal Rotation 4/5    Right Shoulder External Rotation 4-/5    Right Shoulder Horizontal ABduction 4-/5    Left Shoulder Flexion 5/5    Left Shoulder  ABduction 4+/5    Left Shoulder Internal Rotation 4+/5    Left Shoulder External Rotation 4/5      Palpation   Palpation comment mild GH mobility restriction in inferior glide      Special Tests   Other special tests + impingment test, negative labral testing      Transfers   Transfers Independent with all Transfers                      Objective measurements completed on examination: See above findings.       [facility] Adult PT Treatment/Exercise - 05/09/20 0001      Modalities   Modalities Electrical Stimulation;Vasopneumatic      Acupuncturist Location Rt shoulder    Electrical Stimulation Action IFC    Electrical Stimulation Parameters tolerance in sitting with vaso    Electrical Stimulation Goals Pain      Vasopneumatic   Number Minutes Vasopneumatic  10 minutes    Vasopnuematic Location  Shoulder    Vasopneumatic Pressure Medium    Vasopneumatic Temperature  34      Manual Therapy   Manual therapy comments Rt shoulder GH mobs A-P and inferior                  PT Education - 05/09/20 1546    Education Details HEP, POC, TENS    Person(s) Educated Patient    Methods Explanation;Demonstration;Verbal cues;Handout    Comprehension Verbalized understanding;Need further instruction               PT Long Term Goals - 05/09/20 1558      PT LONG TERM GOAL #1   Title Pt will be I and compliant with HEP.    Time 6    Period Weeks    Status New    Target Date 06/20/20      PT LONG TERM GOAL #2   Title Pt will report no more than 2-3/10 pain with usual acitvity.    Time 6    Period Weeks    Status New  PT LONG TERM GOAL #3   Title Pt will improve Rt shoulder ROM to WNL and equal to Lt side.    Time 6    Period Weeks    Status New      PT LONG TERM GOAL #4   Title Pt will improve Rt shoulder strength to 5/5 MMT to improve function.    Time 6    Period Weeks    Status New                   Plan - 05/09/20 1547    Clinical Impression Statement Pt presents with Rt shoulder pain, impingment bursitis and RTC tendonitis based on clinical examination and results from diagnostic [place]. He has overall Rt shoulder weakness, decreased ROM, decreased GH mobility, and increased pain limiting his function. He will benefit from skilled PT to address his deficits.    Personal Factors and Comorbidities Past/Current Experience;Time since onset of injury/illness/exacerbation    Examination-Activity Limitations Carry;Lift;Dressing    Examination-Participation Restrictions Driving;Yard Work    Stability/Clinical Decision Making Stable/Uncomplicated    Designer, jewellery Low    Rehab Potential Good    PT Frequency 2x / week   1-2   PT Duration 6 weeks    PT Treatment/Interventions ADLs/Self Care Home Management;Cryotherapy;Electrical Stimulation;Iontophoresis 4mg /ml Dexamethasone;Moist Heat;Traction;Ultrasound;Neuromuscular re-education;Therapeutic exercise;Therapeutic activities;Manual techniques;Passive range of motion;Taping;Vasopneumatic Device;Joint Manipulations    PT Next Visit Plan review and update HEP PRN, Needs IR ROM, RTC strenght and scap stability. modalaties PRN but has atena so dont charge if use ionto.    PT Home Exercise Plan Access Code: [email or web address]    Consulted and Agree with Plan of Care Patient           Patient will benefit from skilled therapeutic intervention in order to improve the following deficits and impairments:  Decreased activity tolerance, Decreased mobility, Decreased strength, Decreased range of motion, Hypomobility, Postural dysfunction, Impaired flexibility, Pain, Increased fascial restricitons  Visit Diagnosis: No diagnosis found.     Problem List Patient Active Problem List   Diagnosis Date Noted  . Laryngopharyngeal reflux (LPR) 03/24/2018  . Throat clearing 03/24/2018  . Aortic valve regurgitation 03/04/2017  . Depression  01/17/2017  . Major depressive disorder 04/21/2014  . Plantar fasciitis 12/24/2013  . Achilles tendonitis 12/14/2013  . Pronation deformity of ankle, acquired 12/14/2013  . Pain in lower limb 12/14/2013  . Recurrent major depression resistant to treatment ([place]) 01/16/2012  . SOB (shortness of breath) 10/30/2011  . Aortic root dilatation ([place]) 10/30/2011  . Abnormal stress test 10/22/2011  . Chest pain 10/22/2011  . TESTOSTERONE DEFICIENCY 04/19/2008  . ANXIETY 04/28/2007  . INSOMNIA, PERSISTENT 04/28/2007  . ADHD 04/28/2007  . GERD 04/28/2007    [name] 05/09/2020, 4:07 PM  [facility] Physical Therapy [address] [place], [place], [address] Phone: [phone]   Fax:  [phone]  Name: George Proctor MRN: [id number] Date of Birth: [birth year]

## 2020-05-10 DIAGNOSIS — M151 Heberden's nodes (with arthropathy): Secondary | ICD-10-CM | POA: Diagnosis not present

## 2020-05-10 DIAGNOSIS — M1990 Unspecified osteoarthritis, unspecified site: Secondary | ICD-10-CM | POA: Diagnosis not present

## 2020-05-10 DIAGNOSIS — M542 Cervicalgia: Secondary | ICD-10-CM | POA: Diagnosis not present

## 2020-05-10 DIAGNOSIS — M79646 Pain in unspecified finger(s): Secondary | ICD-10-CM | POA: Diagnosis not present

## 2020-05-11 ENCOUNTER — Other Ambulatory Visit: Payer: Self-pay

## 2020-05-11 ENCOUNTER — Ambulatory Visit: Payer: 59 | Admitting: Physical Therapy

## 2020-05-11 DIAGNOSIS — M6281 Muscle weakness (generalized): Secondary | ICD-10-CM

## 2020-05-11 DIAGNOSIS — R600 Localized edema: Secondary | ICD-10-CM | POA: Diagnosis not present

## 2020-05-11 DIAGNOSIS — M25511 Pain in right shoulder: Secondary | ICD-10-CM | POA: Diagnosis not present

## 2020-05-11 DIAGNOSIS — G8929 Other chronic pain: Secondary | ICD-10-CM | POA: Diagnosis not present

## 2020-05-11 NOTE — Therapy (Signed)
[place], [place], [address] Phone: [phone]   Fax:  [phone]  Physical Therapy Treatment  Patient Details  Name: George Proctor MRN: [id number] Date of Birth: 1955-12-16 Referring Provider (PT): Gregor Hams, MD   Encounter Date: 05/11/2020   PT End of Session - 05/11/20 0957    Visit Number 2    Number of Visits 12    Date for PT Re-Evaluation 06/20/20    Authorization Type Aetna    PT Start Time [phone]    PT Stop Time 0937    PT Time Calculation (min) 48 min    Activity Tolerance Patient tolerated treatment well    Behavior During Therapy [facility] for tasks assessed/performed           Past Medical History:  Diagnosis Date  . Anxiety   . Depression   . Gait difficulty   . Memory loss     Past Surgical History:  Procedure Laterality Date  . ABDOMINAL SURGERY     Childhood  . BUNIONECTOMY      There were no vitals filed for this visit.   Subjective Assessment - 05/11/20 0947    Subjective Relays his Rt shoulder is about the same    Diagnostic tests MSK US Impression: "Supple biceps tendinitis subacromial bursitis tendinopathy subscapularis tendon"    Patient Stated Goals reduce pain    Pain Onset More than a month ago            [facility] Adult PT Treatment/Exercise - 05/11/20 0001      Exercises   Exercises Shoulder      Shoulder Exercises: Standing   External Rotation Right;20 reps    Theraband Level (Shoulder External Rotation) Level 3 (Green)    Internal Rotation Right;20 reps    Theraband Level (Shoulder Internal Rotation) Level 3 (Green)    Flexion Both;20 reps    Shoulder Flexion Weight (lbs) 2    ABduction Both;20 reps    Shoulder ABduction Weight (lbs) 2    Extension Both;20 reps    Theraband Level (Shoulder Extension) Level 3 (Green)    Row Both;20 reps    Theraband Level (Shoulder Row) Level 3 (Green)      Shoulder Exercises: Pulleys   Flexion 2 minutes    ABduction 2  minutes      Shoulder Exercises: ROM/Strengthening   UBE (Upper Arm Bike) L3 3 min fwd, and 3 min reverse      Shoulder Exercises: Stretch   Other Shoulder Stretches doorway stretch 20 sec X 3    Other Shoulder Stretches Sleeper stretch 20 sec X 3      Modalities   Modalities Cryotherapy      Cryotherapy   Number Minutes Cryotherapy 10 Minutes   used ice pack today as vaso was not available   Cryotherapy Location Shoulder    Type of Cryotherapy Ice pack      Electrical Stimulation   Electrical Stimulation Location Rt shoulder    Electrical Stimulation Action IFC    Electrical Stimulation Parameters tolearance in sitting with ice    Electrical Stimulation Goals Pain      Manual Therapy   Manual therapy comments Rt shoulder PROM with more time spent on IR ROM, GH mobs A-P and inferior                       PT Long Term Goals - 05/09/20 1558      PT  LONG TERM GOAL #1   Title Pt will be I and compliant with HEP.    Time 6    Period Weeks    Status New    Target Date 06/20/20      PT LONG TERM GOAL #2   Title Pt will report no more than 2-3/10 pain with usual acitvity.    Time 6    Period Weeks    Status New      PT LONG TERM GOAL #3   Title Pt will improve Rt shoulder ROM to WNL and equal to Lt side.    Time 6    Period Weeks    Status New      PT LONG TERM GOAL #4   Title Pt will improve Rt shoulder strength to 5/5 MMT to improve function.    Time 6    Period Weeks    Status New                 Plan - 05/11/20 1000    Clinical Impression Statement Session foucsed on Rt shoulder strengthening and ROM today as tolerated but did have increased pain by end of sesison so used cryotherapy and TENS to reduce this. Continue POC    Personal Factors and Comorbidities Past/Current Experience;Time since onset of injury/illness/exacerbation    Examination-Activity Limitations Carry;Lift;Dressing    Examination-Participation Restrictions Driving;Yard  Work    Stability/Clinical Decision Making Stable/Uncomplicated    Rehab Potential Good    PT Frequency 2x / week   1-2   PT Duration 6 weeks    PT Treatment/Interventions ADLs/Self Care Home Management;Cryotherapy;Electrical Stimulation;Iontophoresis 4mg /ml Dexamethasone;Moist Heat;Traction;Ultrasound;Neuromuscular re-education;Therapeutic exercise;Therapeutic activities;Manual techniques;Passive range of motion;Taping;Vasopneumatic Device;Joint Manipulations    PT Next Visit Plan review and update HEP PRN, Needs IR ROM, RTC strenght and scap stability. modalaties PRN but has atena so dont charge if use ionto.    PT Home Exercise Plan Access Code: [email or web address]    Consulted and Agree with Plan of Care Patient           Patient will benefit from skilled therapeutic intervention in order to improve the following deficits and impairments:  Decreased activity tolerance, Decreased mobility, Decreased strength, Decreased range of motion, Hypomobility, Postural dysfunction, Impaired flexibility, Pain, Increased fascial restricitons  Visit Diagnosis: Chronic right shoulder pain  Muscle weakness (generalized)  Localized edema     Problem List Patient Active Problem List   Diagnosis Date Noted  . Laryngopharyngeal reflux (LPR) 03/24/2018  . Throat clearing 03/24/2018  . Aortic valve regurgitation 03/04/2017  . Depression 01/17/2017  . Major depressive disorder 04/21/2014  . Plantar fasciitis 12/24/2013  . Achilles tendonitis 12/14/2013  . Pronation deformity of ankle, acquired 12/14/2013  . Pain in lower limb 12/14/2013  . Recurrent major depression resistant to treatment ([place]) 01/16/2012  . SOB (shortness of breath) 10/30/2011  . Aortic root dilatation ([place]) 10/30/2011  . Abnormal stress test 10/22/2011  . Chest pain 10/22/2011  . TESTOSTERONE DEFICIENCY 04/19/2008  . ANXIETY 04/28/2007  . INSOMNIA, PERSISTENT 04/28/2007  . ADHD 04/28/2007  . GERD 04/28/2007    George Proctor 05/11/2020, 10:02 AM  [facility] Physical Therapy [address] [place], [place], [address] Phone: [phone]   Fax:  [phone]  Name: George Proctor MRN: [id number] Date of Birth: December 08, 1955

## 2020-05-12 ENCOUNTER — Encounter: Payer: Self-pay | Admitting: Internal Medicine

## 2020-05-12 ENCOUNTER — Ambulatory Visit: Payer: 59 | Admitting: Internal Medicine

## 2020-05-12 VITALS — BP 110/76 | HR 106 | Ht 73.0 in | Wt 186.8 lb

## 2020-05-12 DIAGNOSIS — Z8249 Family history of ischemic heart disease and other diseases of the circulatory system: Secondary | ICD-10-CM

## 2020-05-12 DIAGNOSIS — I77810 Thoracic aortic ectasia: Secondary | ICD-10-CM | POA: Diagnosis not present

## 2020-05-12 DIAGNOSIS — R931 Abnormal findings on diagnostic imaging of heart and coronary circulation: Secondary | ICD-10-CM

## 2020-05-12 NOTE — Patient Instructions (Signed)
Medication Instructions:  Your physician recommends that you continue on your current medications as directed. Please refer to the Current Medication list given to you today.  *If you need a refill on your cardiac medications before your next appointment, please call your pharmacy*   Lab Work: FASTING lipid panel in 6 months to check cholesterol   If you have labs (blood work) drawn today and your tests are completely normal, you will receive your results only by: Marland Kitchen MyChart Message (if you have MyChart) OR . A paper copy in the mail If you have any lab test that is abnormal or we need to change your treatment, we will call you to review the results.   Testing/Procedures: NONE   Follow-Up: At [facility], you and your health needs are our priority.  As part of our continuing mission to provide you with exceptional heart care, we have created designated Provider Care Teams.  These Care Teams include your primary Cardiologist (physician) and Advanced Practice Providers (APPs -  Physician Assistants and Nurse Practitioners) who all work together to provide you with the care you need, when you need it.  We recommend signing up for the patient portal called "MyChart".  Sign up information is provided on this After Visit Summary.  MyChart is used to connect with patients for Virtual Visits (Telemedicine).  Patients are able to view lab/test results, encounter notes, upcoming appointments, etc.  Non-urgent messages can be sent to your provider as well.   To learn more about what you can do with MyChart, go to [email or web address].    Your next appointment:   6 month(s)  The format for your next appointment:   In Person  Provider:   Dr. Lyman Bishop  Other Instructions

## 2020-05-12 NOTE — Progress Notes (Signed)
OFFICE CONSULT NOTE  Chief Complaint:  Routine follow-up  Primary Care Physician: [name], MD  HPI:  George Proctor is a 64 y.o. male who is being seen today for the evaluation of the above chief complaints at the request of [name], MD. Mr. Hopwood has a history of anxiety and depression as well as memory loss in the past. Recently he's been suffering from shortness of breath, fatigue, mental fogginess, anhedonia and symptoms concerning for depression. He denies any anginal symptoms, per se. His shortness of breath sometimes is associated with exertion and can be present at rest. He says that he's had a mental fogginess and has definitely struggled with depression symptoms. He is currently on nortriptyline for his depression. Otherwise he takes no medications. Is no history of hypertension, diabetes or significant dyslipidemia. Family history is significant for hypertension and heart disease in his father but he still living at age 11.   03/04/2017  Mr. Berkley returns today for follow-up. He continues to report some significant fatigue and depression. He has been referred to a psychiatrist who started him on Adderall, bupropion and Rexulti with diagnosis of depression and ADD. He says now he feels worse than he was when he started the medications. He says he is extremely slow to get moving in the morning after starting on the medications. Today we reviewed his echocardiogram which demonstrates further effacement of the aortic valve measuring 4.6 cm at the sinus of Valsalva with mild to moderate aortic insufficiency. He is also hypotensive today 98/70. He is not on any blood pressure medication and reports she's had some occasional positional dizziness. This could be related to [place], which is a fairly newer medication, but has listed orthostatic hypotension as a side effect. Again, Mr. Eastman had a cardiac MRA in 2015 which showed some worsening of his aortic root dilatation compared to an  earlier study, but no ascending aortic aneurysm. This echocardiogram also demonstrated an ascending aortic measurement of 3.8 cm. He reported that his mother died of a thoracic aneurysm as well.  08/10/2018  Mr. Schwartz is seen today in follow-up.  He underwent an echo this summer was seen by Almyra Deforest, PA-C for dyspnea.  This seems to be fairly consistent and does not seem to be worsening.  Is not thought that the AI is a cause for his dyspnea.  His aortic root is stable in size.  In fact he had a CT angiogram earlier this year which showed a somewhat smaller measurement 4.2 cm.  This will need to be followed by echo.  He does report his chest pain has improved and it was felt that this was reflux.  He is currently on a PPI.  He does see psychiatry but is been on and off various medications for depression and fatigue without much benefit.  04/05/2019  Mr. Wehling is referred back for follow-up today.  He recently saw his PCP and had described some chest discomfort.  This was left sternal discomfort which was in a small area and rated as 1 out of 10 in intensity.  It did not radiate.  Subsequently has gone away.  Does not sound cardiac in nature.  He continues to struggle with depression and inactivity.  He says antidepressants including stimulants that were not effective for him.  He is not currently on any medications other than a PPI.  EKG performed in their office showed sinus rhythm with some nonspecific QRS changes.  I repeated an EKG today which  shows sinus tachycardia at 107 without ischemic changes and some possible right atrial enlargement.  He does have a history of an aneurysmal thoracic aorta as well as the descending aortic aneurysmal segment.  He was seen by Dr. early about a year ago who recommended repeat imaging.  A CT has been ordered but not obtained.  05/12/2020  Mr. Brew is seen today for annual follow-up.  Overall he continues to do well without complaints.  He is undergoing some physical  therapy.  More recently was noted to have somewhat elevated cholesterol with an LDL 139.  He says he eats healthy and is very active.  This could be related also to testosterone injections which he is undergoing and more recently he started on Latuda and his depression.  Some chest pain earlier this spring and was seen by Almyra Deforest, PA-C and underwent nuclear stress testing in May 2021 which is low risk Myoview showed an EF of 61%.  He reports afterwards the chest pain had resolved.  PMHx:  Past Medical History:  Diagnosis Date  . Anxiety   . Depression   . Gait difficulty   . Memory loss     Past Surgical History:  Procedure Laterality Date  . ABDOMINAL SURGERY     Childhood  . BUNIONECTOMY      FAMHx:  Family History  Problem Relation Age of Onset  . Aneurysm Mother 24       Thoracic  . Hypertension Father   . Heart failure Father   . Heart disease Father   . Heart attack Paternal Grandfather   . Depression Cousin   . Bipolar disorder Cousin   . Schizophrenia Maternal Uncle     SOCHx:   reports that he has never smoked. He has never used smokeless tobacco. He reports that he does not drink alcohol and does not use drugs.  ALLERGIES:  Allergies  Allergen Reactions  . Omeprazole Nausea And Vomiting    ROS: Pertinent items noted in HPI and remainder of comprehensive ROS otherwise negative.  HOME MEDS: Current Outpatient Medications on File Prior to Visit  Medication Sig Dispense Refill  . Ascorbic Acid (VITAMIN C PO) Take 2,000 mg by mouth.     . B Complex Vitamins (VITAMIN B COMPLEX PO) Take by mouth.    . fish oil-omega-3 fatty acids 1000 MG capsule Take 6 g by mouth daily.     Marland Kitchen lamoTRIgine Starter Kit-Orange 42 x 25 MG & 7 x 100 MG KIT SMARTSIG:Packet(s) By Mouth As Directed    . lurasidone (LATUDA) 20 MG TABS tablet Take 20 mg by mouth daily.    . Multiple Vitamin (MULTIVITAMIN) capsule Take 1 capsule by mouth daily.    . pantoprazole (PROTONIX) 40 MG tablet      . testosterone cypionate (DEPOTESTOSTERONE CYPIONATE) 200 MG/ML injection SMARTSIG:0.5 Milliliter(s) IM Every 2 Weeks     No current facility-administered medications on file prior to visit.    LABS/IMAGING: No results found for this or any previous visit (from the past 48 hour(s)). No results found.  LIPID PANEL:    Component Value Date/Time   CHOL 197 11/03/2007 1136   TRIG 119 11/03/2007 1136   HDL 37.8 (L) 11/03/2007 1136   CHOLHDL 5.2 CALC 11/03/2007 1136   VLDL 24 11/03/2007 1136   LDLCALC 135 (H) 11/03/2007 1136    WEIGHTS: Wt Readings from Last 3 Encounters:  05/12/20 186 lb 12.8 oz (84.7 kg)  04/19/20 183 lb (83 kg)  02/11/20 178  lb 6.4 oz (80.9 kg)    VITALS: BP 110/76   Pulse (!) 106   Ht 6' 1" (1.854 m)   Wt 186 lb 12.8 oz (84.7 kg)   SpO2 96%   BMI 24.65 kg/m   EXAM: General appearance: alert and no distress Neck: no carotid bruit, no JVD and thyroid not enlarged, symmetric, no tenderness/mass/nodules Lungs: clear to auscultation bilaterally Heart: regular rate and rhythm and diastolic murmur: early diastolic 2/6, blowing at apex Abdomen: soft, non-tender; bowel sounds normal; no masses,  no organomegaly Extremities: extremities normal, atraumatic, no cyanosis or edema Pulses: 2+ and symmetric Skin: Skin color, texture, turgor normal. No rashes or lesions Neurologic: Grossly normal Psych: Pleasant  EKG: Sinus tachycardia 106, right atrial marginal-personally reviewed  ASSESSMENT: 1. Atypical chest pain-low risk Myoview 01/2020, normal LV function 2. Aortic root dilatation-measuring 4.1-4.6 cm (01/2017) 3. Mild to moderate aortic insufficiency 4. Shortness of breath, fatigue 5. Probable depression 6. Orthostatic hypotension  PLAN: 1.   Mr. Corvin had recent chest pain with a low risk Myoview which is reassuring.  The chest pain has resolved.  He had an ascending aortic root dilatation which measured between 4.1 and 4.6 cm of the more recently was  no greater than 4.3 cm.  This will need to continue to be followed.  CT calcium score was performed in May 2021 which showed an upper limit of normal size ascending aorta measuring 4.0 cm.  Plan follow-up with me annually or sooner as necessary.  Pixie Casino, MD, [facility], [place] Director of the [facility]  [facility] Diplomate of the American Board of Clinical Lipidology Attending Cardiologist  Direct Dial: [phone]  Fax: [phone]  Website:  [email or web address]  05/12/2020, 1:44 PM

## 2020-05-13 ENCOUNTER — Encounter: Payer: Self-pay | Admitting: Internal Medicine

## 2020-05-15 DIAGNOSIS — F900 Attention-deficit hyperactivity disorder, predominantly inattentive type: Secondary | ICD-10-CM | POA: Diagnosis not present

## 2020-05-15 DIAGNOSIS — R69 Illness, unspecified: Secondary | ICD-10-CM | POA: Diagnosis not present

## 2020-05-15 DIAGNOSIS — F411 Generalized anxiety disorder: Secondary | ICD-10-CM | POA: Diagnosis not present

## 2020-05-16 ENCOUNTER — Ambulatory Visit: Payer: 59 | Admitting: Physical Therapy

## 2020-05-16 ENCOUNTER — Other Ambulatory Visit: Payer: Self-pay

## 2020-05-16 DIAGNOSIS — G8929 Other chronic pain: Secondary | ICD-10-CM | POA: Diagnosis not present

## 2020-05-16 DIAGNOSIS — M6281 Muscle weakness (generalized): Secondary | ICD-10-CM

## 2020-05-16 DIAGNOSIS — R600 Localized edema: Secondary | ICD-10-CM | POA: Diagnosis not present

## 2020-05-16 DIAGNOSIS — M25511 Pain in right shoulder: Secondary | ICD-10-CM | POA: Diagnosis not present

## 2020-05-16 NOTE — Therapy (Signed)
[place], [place], [address] Phone: [phone]   Fax:  [phone]  Physical Therapy Treatment  Patient Details  Name: George Proctor MRN: [id number] Date of Birth: 03-02-1956 Referring Provider (PT): Gregor Hams, MD   Encounter Date: 05/16/2020   PT End of Session - 05/16/20 1057    Visit Number 3    Number of Visits 12    Date for PT Re-Evaluation 06/20/20    Authorization Type Aetna    PT Start Time 1015    PT Stop Time 1103    PT Time Calculation (min) 48 min    Activity Tolerance Patient tolerated treatment well    Behavior During Therapy [facility] for tasks assessed/performed           Past Medical History:  Diagnosis Date  . Anxiety   . Depression   . Gait difficulty   . Memory loss     Past Surgical History:  Procedure Laterality Date  . ABDOMINAL SURGERY     Childhood  . BUNIONECTOMY      There were no vitals filed for this visit.   Subjective Assessment - 05/16/20 1052    Subjective Relays his Rt shoulder was more sore and sensative after last session so has not done any of the HEP. He denies pain upon arrival when his arm is not in use but still with tighntess and pain when he moves into extension or behind back    Diagnostic tests MSK US Impression: "Supple biceps tendinitis subacromial bursitis tendinopathy subscapularis tendon"    Patient Stated Goals reduce pain    Pain Onset More than a month ago              [facility] Adult PT Treatment/Exercise - 05/16/20 0001      Exercises   Exercises Shoulder      Shoulder Exercises: Prone   Other Prone Exercises Prone Y,T,W X 10 reps ea for Rt shoulder      Shoulder Exercises: Standing   External Rotation Right;20 reps    Theraband Level (Shoulder External Rotation) Level 2 (Red)    Internal Rotation Right;20 reps    Theraband Level (Shoulder Internal Rotation) Level 2 (Red)    Flexion --    Shoulder Flexion Weight (lbs) --    ABduction  --    Shoulder ABduction Weight (lbs) --    Extension Both;20 reps    Theraband Level (Shoulder Extension) Level 3 (Green)    Row Both;20 reps    Theraband Level (Shoulder Row) Level 3 (Green)      Shoulder Exercises: Pulleys   Flexion 2 minutes    ABduction 2 minutes      Shoulder Exercises: ROM/Strengthening   UBE (Upper Arm Bike) L3 3 min fwd, and 3 min reverse      Shoulder Exercises: Stretch   Other Shoulder Stretches doorway stretch 20 sec X 3    Other Shoulder Stretches IR stretch behind back with strap 5 sec X 15      Modalities   Modalities Electrical Stimulation;Vasopneumatic      Cryotherapy   Cryotherapy Location --      Acupuncturist Location Rt shoulder    Electrical Stimulation Action IFC    Electrical Stimulation Parameters tolerance in sitting with vaso    Electrical Stimulation Goals Pain      Vasopneumatic   Number Minutes Vasopneumatic  10 minutes    Vasopnuematic Location  Shoulder  Vasopneumatic Pressure Medium    Vasopneumatic Temperature  34      Manual Therapy   Manual therapy comments --                       PT Long Term Goals - 05/09/20 1558      PT LONG TERM GOAL #1   Title Pt will be I and compliant with HEP.    Time 6    Period Weeks    Status New    Target Date 06/20/20      PT LONG TERM GOAL #2   Title Pt will report no more than 2-3/10 pain with usual acitvity.    Time 6    Period Weeks    Status New      PT LONG TERM GOAL #3   Title Pt will improve Rt shoulder ROM to WNL and equal to Lt side.    Time 6    Period Weeks    Status New      PT LONG TERM GOAL #4   Title Pt will improve Rt shoulder strength to 5/5 MMT to improve function.    Time 6    Period Weeks    Status New                 Plan - 05/16/20 1058    Clinical Impression Statement Backed down on strengthening some due to soreness after last visit. Continued with vaso and TENS to reduce overall  inflammation and edema post tx. His ROM is improving well.    Personal Factors and Comorbidities Past/Current Experience;Time since onset of injury/illness/exacerbation    Examination-Activity Limitations Carry;Lift;Dressing    Examination-Participation Restrictions Driving;Yard Work    Stability/Clinical Decision Making Stable/Uncomplicated    Rehab Potential Good    PT Frequency 2x / week   1-2   PT Duration 6 weeks    PT Treatment/Interventions ADLs/Self Care Home Management;Cryotherapy;Electrical Stimulation;Iontophoresis 4mg /ml Dexamethasone;Moist Heat;Traction;Ultrasound;Neuromuscular re-education;Therapeutic exercise;Therapeutic activities;Manual techniques;Passive range of motion;Taping;Vasopneumatic Device;Joint Manipulations    PT Next Visit Plan review and update HEP PRN, Needs IR ROM, RTC strenght and scap stability. modalaties PRN but has atena so dont charge if use ionto.    PT Home Exercise Plan Access Code: [email or web address]    Consulted and Agree with Plan of Care Patient           Patient will benefit from skilled therapeutic intervention in order to improve the following deficits and impairments:  Decreased activity tolerance, Decreased mobility, Decreased strength, Decreased range of motion, Hypomobility, Postural dysfunction, Impaired flexibility, Pain, Increased fascial restricitons  Visit Diagnosis: Chronic right shoulder pain  Muscle weakness (generalized)  Localized edema     Problem List Patient Active Problem List   Diagnosis Date Noted  . Laryngopharyngeal reflux (LPR) 03/24/2018  . Throat clearing 03/24/2018  . Aortic valve regurgitation 03/04/2017  . Depression 01/17/2017  . Major depressive disorder 04/21/2014  . Plantar fasciitis 12/24/2013  . Achilles tendonitis 12/14/2013  . Pronation deformity of ankle, acquired 12/14/2013  . Pain in lower limb 12/14/2013  . Recurrent major depression resistant to treatment ([place]) 01/16/2012  . SOB (shortness of  breath) 10/30/2011  . Aortic root dilatation ([place]) 10/30/2011  . Abnormal stress test 10/22/2011  . Chest pain 10/22/2011  . TESTOSTERONE DEFICIENCY 04/19/2008  . ANXIETY 04/28/2007  . INSOMNIA, PERSISTENT 04/28/2007  . ADHD 04/28/2007  . GERD 04/28/2007    [name] 05/16/2020, 10:59 AM  [facility] [address] [place], [place], [address] Phone: [phone]   Fax:  [phone]  Name: ADRYEL WORTMANN MRN: [id number] Date of Birth: May 06, 1956

## 2020-05-18 ENCOUNTER — Ambulatory Visit: Payer: 59 | Admitting: Physical Therapy

## 2020-05-18 ENCOUNTER — Other Ambulatory Visit: Payer: Self-pay

## 2020-05-18 DIAGNOSIS — G8929 Other chronic pain: Secondary | ICD-10-CM

## 2020-05-18 DIAGNOSIS — M6281 Muscle weakness (generalized): Secondary | ICD-10-CM | POA: Diagnosis not present

## 2020-05-18 DIAGNOSIS — M25511 Pain in right shoulder: Secondary | ICD-10-CM

## 2020-05-18 DIAGNOSIS — R600 Localized edema: Secondary | ICD-10-CM

## 2020-05-18 NOTE — Therapy (Signed)
[place], [place], [address] Phone: [phone]   Fax:  [phone]  Physical Therapy Treatment  Patient Details  Name: George Proctor MRN: [id number] Date of Birth: [birth year] Referring Provider (PT): Gregor Hams, MD   Encounter Date: 05/18/2020   PT End of Session - 05/18/20 0921    Visit Number 4    Number of Visits 12    Date for PT Re-Evaluation 06/20/20    Authorization Type Aetna    PT Start Time [id number]    PT Stop Time 0926    PT Time Calculation (min) 43 min    Activity Tolerance Patient tolerated treatment well    Behavior During Therapy [facility] for tasks assessed/performed           Past Medical History:  Diagnosis Date  . Anxiety   . Depression   . Gait difficulty   . Memory loss     Past Surgical History:  Procedure Laterality Date  . ABDOMINAL SURGERY     Childhood  . BUNIONECTOMY      There were no vitals filed for this visit.   Subjective Assessment - 05/18/20 0847    Subjective Relays no pain at rest but still with pain reaching behind his back. Denies pain reaching up now but it feels different on his Rt side compared to his Lt side.    Diagnostic tests MSK US Impression: "Supple biceps tendinitis subacromial bursitis tendinopathy subscapularis tendon"    Patient Stated Goals reduce pain    Pain Onset More than a month ago                             [facility] Adult PT Treatment/Exercise - 05/18/20 0001      Shoulder Exercises: Prone   Other Prone Exercises Prone Y,T,I X 10 reps ea for Rt shoulder      Shoulder Exercises: Standing   External Rotation Right    Theraband Level (Shoulder External Rotation) Level 2 (Red)    External Rotation Limitations 2X15    Internal Rotation Right    Theraband Level (Shoulder Internal Rotation) Level 2 (Red)    Internal Rotation Limitations 2X15    Extension Both    Theraband Level (Shoulder Extension) Level 2 (Red)    Extension  Limitations 2X15    Row Both    Theraband Level (Shoulder Row) Level 2 (Red)    Row Limitations 2X15    Other Standing Exercises with 2# weight bar IR behind back and extension behind back X 15 reps ea    Other Standing Exercises OH shoulder stability rolls      Shoulder Exercises: ROM/Strengthening   UBE (Upper Arm Bike) L3 3 min fwd, and 3 min reverse      Modalities   Modalities Iontophoresis      Electrical Stimulation   Electrical Stimulation Location Rt shoulder    Electrical Stimulation Action IFC    Electrical Stimulation Parameters tolerance in sitting with vaso    Electrical Stimulation Goals Pain      Iontophoresis   Type of Iontophoresis Dexamethasone    Location Rt shoulder    Dose 1.0 cc    Time 4 hour wear home patch      Vasopneumatic   Number Minutes Vasopneumatic  10 minutes    Vasopnuematic Location  Shoulder    Vasopneumatic Pressure Medium    Vasopneumatic Temperature  34  PT Education - 05/18/20 0921    Education Details Ionto instructions and rationale    Person(s) Educated Patient    Methods Explanation    Comprehension Verbalized understanding               PT Long Term Goals - 05/09/20 1558      PT LONG TERM GOAL #1   Title Pt will be I and compliant with HEP.    Time 6    Period Weeks    Status New    Target Date 06/20/20      PT LONG TERM GOAL #2   Title Pt will report no more than 2-3/10 pain with usual acitvity.    Time 6    Period Weeks    Status New      PT LONG TERM GOAL #3   Title Pt will improve Rt shoulder ROM to WNL and equal to Lt side.    Time 6    Period Weeks    Status New      PT LONG TERM GOAL #4   Title Pt will improve Rt shoulder strength to 5/5 MMT to improve function.    Time 6    Period Weeks    Status New                 Plan - 05/18/20 2703    Clinical Impression Statement continued with scapular stability exercises to tolerance and trialed Ionto today in  efforts to decrease overall pain and inflammation that he has when he extends his arm behind his back.    Personal Factors and Comorbidities Past/Current Experience;Time since onset of injury/illness/exacerbation    Examination-Activity Limitations Carry;Lift;Dressing    Examination-Participation Restrictions Driving;Yard Work    Stability/Clinical Decision Making Stable/Uncomplicated    Rehab Potential Good    PT Frequency 2x / week   1-2   PT Duration 6 weeks    PT Treatment/Interventions ADLs/Self Care Home Management;Cryotherapy;Electrical Stimulation;Iontophoresis 4mg /ml Dexamethasone;Moist Heat;Traction;Ultrasound;Neuromuscular re-education;Therapeutic exercise;Therapeutic activities;Manual techniques;Passive range of motion;Taping;Vasopneumatic Device;Joint Manipulations    PT Next Visit Plan how was ionto?   but has atena so dont charge if use ionto. Needs IR ROM, RTC strenght and scap stability. modalaties PRN    PT Home Exercise Plan Access Code: [email or web address]    Consulted and Agree with Plan of Care Patient           Patient will benefit from skilled therapeutic intervention in order to improve the following deficits and impairments:  Decreased activity tolerance, Decreased mobility, Decreased strength, Decreased range of motion, Hypomobility, Postural dysfunction, Impaired flexibility, Pain, Increased fascial restricitons  Visit Diagnosis: Chronic right shoulder pain  Muscle weakness (generalized)  Localized edema     Problem List Patient Active Problem List   Diagnosis Date Noted  . Laryngopharyngeal reflux (LPR) 03/24/2018  . Throat clearing 03/24/2018  . Aortic valve regurgitation 03/04/2017  . Depression 01/17/2017  . Major depressive disorder 04/21/2014  . Plantar fasciitis 12/24/2013  . Achilles tendonitis 12/14/2013  . Pronation deformity of ankle, acquired 12/14/2013  . Pain in lower limb 12/14/2013  . Recurrent major depression resistant to treatment ([place])  01/16/2012  . SOB (shortness of breath) 10/30/2011  . Aortic root dilatation ([place]) 10/30/2011  . Abnormal stress test 10/22/2011  . Chest pain 10/22/2011  . TESTOSTERONE DEFICIENCY 04/19/2008  . ANXIETY 04/28/2007  . INSOMNIA, PERSISTENT 04/28/2007  . ADHD 04/28/2007  . GERD 04/28/2007    [name] 05/18/2020, 9:31 AM  [facility] [address] [place], [place], [address] Phone: [phone]   Fax:  [phone]  Name: SCHYLER COUNSELL MRN: [id number] Date of Birth: 19-Feb-1956

## 2020-05-23 ENCOUNTER — Ambulatory Visit: Payer: 59 | Admitting: Physical Therapy

## 2020-05-23 ENCOUNTER — Other Ambulatory Visit: Payer: Self-pay

## 2020-05-23 DIAGNOSIS — G8929 Other chronic pain: Secondary | ICD-10-CM

## 2020-05-23 DIAGNOSIS — M6281 Muscle weakness (generalized): Secondary | ICD-10-CM | POA: Diagnosis not present

## 2020-05-23 DIAGNOSIS — M25511 Pain in right shoulder: Secondary | ICD-10-CM

## 2020-05-23 DIAGNOSIS — R600 Localized edema: Secondary | ICD-10-CM

## 2020-05-23 NOTE — Therapy (Addendum)
[place], [place], [address] Phone: [phone]   Fax:  [phone]  Physical Therapy Treatment  Patient Details  Name: George Proctor MRN: [id number] Date of Birth: 05-17-1956 Referring Provider (PT): Gregor Hams, MD   Encounter Date: 05/23/2020   PT End of Session - 05/23/20 1052    Visit Number 5    Number of Visits 12    Date for PT Re-Evaluation 06/20/20    Authorization Type Aet[employer]    PT Start Time 0973    PT Stop Time 1058    PT Time Calculation (min) 43 min    Activity Tolerance Patient tolerated treatment well    Behavior During Therapy [facility] for tasks assessed/performed           Past Medical History:  Diagnosis Date  . Anxiety   . Depression   . Gait difficulty   . Memory loss     Past Surgical History:  Procedure Laterality Date  . ABDOMINAL SURGERY     Childhood  . BUNIONECTOMY      There were no vitals filed for this visit.   Subjective Assessment - 05/23/20 1019    Subjective Relays no pain at rest but still with some intermittent pains in his shoulder but overall not as bad and does not notice it as often.    Diagnostic tests MSK US Impression: "Supple biceps tendinitis subacromial bursitis tendinopathy subscapularis tendon"    Patient Stated Goals reduce pain    Pain Onset More than a month ago             [facility] Adult PT Treatment/Exercise - 05/23/20 0001      Shoulder Exercises: Prone   Other Prone Exercises Prone Y,T,I X 15 reps ea holding 2 seconds for Rt shoulder      Shoulder Exercises: Sidelying   External Rotation Right    External Rotation Weight (lbs) 2    External Rotation Limitations 2X15    ABduction Right    ABduction Weight (lbs) 2    ABduction Limitations 2X15      Shoulder Exercises: Standing   Other Standing Exercises with 3# weight bar IR behind back and extension behind back X 15 reps ea    Other Standing Exercises bent over rows 5 lbs 2X15       Shoulder Exercises: ROM/Strengthening   UBE (Upper Arm Bike) L3 3 min fwd, and 3 min reverse      Electrical Stimulation   Electrical Stimulation Location Rt shoulder    Electrical Stimulation Action IFC    Electrical Stimulation Parameters tolerance for 12 min with vaso    Electrical Stimulation Goals Pain      Vasopneumatic   Number Minutes Vasopneumatic  10 minutes    Vasopnuematic Location  Shoulder    Vasopneumatic Pressure Medium    Vasopneumatic Temperature  34                       PT Long Term Goals - 05/09/20 1558      PT LONG TERM GOAL #1   Title Pt will be I and compliant with HEP.    Time 6    Period Weeks    Status New    Target Date 06/20/20      PT LONG TERM GOAL #2   Title Pt will report no more than 2-3/10 pain with usual acitvity.    Time 6    Period Weeks  Status New      PT LONG TERM GOAL #3   Title Pt will improve Rt shoulder ROM to WNL and equal to Lt side.    Time 6    Period Weeks    Status New      PT LONG TERM GOAL #4   Title Pt will improve Rt shoulder strength to 5/5 MMT to improve function.    Time 6    Period Weeks    Status New                 Plan - 05/23/20 1049    Clinical Impression Statement Overall appears to be improving with PT, less overall pain, improved strength and ROM but still with intermittent pain and referred pain down his posterior latreal Rt mid/upper arm. Again used modalties at end to reduce pain and inflamation. Continue POC.    Personal Factors and Comorbidities Past/Current Experience;Time since onset of injury/illness/exacerbation    Examination-Activity Limitations Carry;Lift;Dressing    Examination-Participation Restrictions Driving;Yard Work    Stability/Clinical Decision Making Stable/Uncomplicated    Rehab Potential Good    PT Frequency 2x / week   1-2   PT Duration 6 weeks    PT Treatment/Interventions ADLs/Self Care Home Management;Cryotherapy;Electrical  Stimulation;Iontophoresis 4mg /ml Dexamethasone;Moist Heat;Traction;Ultrasound;Neuromuscular re-education;Therapeutic exercise;Therapeutic activities;Manual techniques;Passive range of motion;Taping;Vasopneumatic Device;Joint Manipulations    PT Next Visit Plan update ROM and goals next session,  Needs IR ROM, RTC strenght and scap stability. modalaties PRN    PT Home Exercise Plan Access Code: [email or web address]    Consulted and Agree with Plan of Care Patient           Patient will benefit from skilled therapeutic intervention in order to improve the following deficits and impairments:  Decreased activity tolerance, Decreased mobility, Decreased strength, Decreased range of motion, Hypomobility, Postural dysfunction, Impaired flexibility, Pain, Increased fascial restricitons  Visit Diagnosis: Chronic right shoulder pain  Muscle weakness (generalized)  Localized edema     Problem List Patient Active Problem List   Diagnosis Date Noted  . Laryngopharyngeal reflux (LPR) 03/24/2018  . Throat clearing 03/24/2018  . Aortic valve regurgitation 03/04/2017  . Depression 01/17/2017  . Major depressive disorder 04/21/2014  . Plantar fasciitis 12/24/2013  . Achilles tendonitis 12/14/2013  . Pronation deformity of ankle, acquired 12/14/2013  . Pain in lower limb 12/14/2013  . Recurrent major depression resistant to treatment ([place]) 01/16/2012  . SOB (shortness of breath) 10/30/2011  . Aortic root dilatation ([place]) 10/30/2011  . Abnormal stress test 10/22/2011  . Chest pain 10/22/2011  . TESTOSTERONE DEFICIENCY 04/19/2008  . ANXIETY 04/28/2007  . INSOMNIA, PERSISTENT 04/28/2007  . ADHD 04/28/2007  . GERD 04/28/2007    [name] 05/23/2020, 10:53 AM  [facility] Physical Therapy [address] [place], [place], [address] Phone: [phone]   Fax:  [phone]  Name: George Proctor MRN: [id number] Date of Birth: 1956-05-08

## 2020-05-25 ENCOUNTER — Ambulatory Visit: Payer: 59 | Admitting: Physical Therapy

## 2020-05-25 ENCOUNTER — Other Ambulatory Visit: Payer: Self-pay

## 2020-05-25 DIAGNOSIS — M25511 Pain in right shoulder: Secondary | ICD-10-CM

## 2020-05-25 DIAGNOSIS — G8929 Other chronic pain: Secondary | ICD-10-CM | POA: Diagnosis not present

## 2020-05-25 DIAGNOSIS — M6281 Muscle weakness (generalized): Secondary | ICD-10-CM | POA: Diagnosis not present

## 2020-05-25 DIAGNOSIS — R600 Localized edema: Secondary | ICD-10-CM | POA: Diagnosis not present

## 2020-05-25 NOTE — Therapy (Addendum)
[facility] [address] [place], [place], [address] Phone: [phone]   Fax:  [phone]  Physical Therapy Treatment/Discharge addendum PHYSICAL THERAPY DISCHARGE SUMMARY  Visits from Start of Care: 6  Current functional level related to goals / functional outcomes: See below   Remaining deficits: See below   Education / Equipment: HEP  Plan: Patient agrees to discharge.  Patient goals were partially met. Patient is being discharged due to not returning since the last visit.  ?????   Elsie Ra, PT, DPT 08/28/20 3:34 PM     Patient Details  Name: George Proctor MRN: [id number] Date of Birth: 1955-11-03 Referring Provider (PT): Gregor Hams, MD   Encounter Date: 05/25/2020   PT End of Session - 05/25/20 1048    Visit Number 6    Number of Visits 12    Date for PT Re-Evaluation 06/20/20    Authorization Type Aetna    PT Start Time 1015    PT Stop Time 1102    PT Time Calculation (min) 47 min    Activity Tolerance Patient tolerated treatment well    Behavior During Therapy [facility] for tasks assessed/performed           Past Medical History:  Diagnosis Date  . Anxiety   . Depression   . Gait difficulty   . Memory loss     Past Surgical History:  Procedure Laterality Date  . ABDOMINAL SURGERY     Childhood  . BUNIONECTOMY      There were no vitals filed for this visit.   Subjective Assessment - 05/25/20 1029    Subjective relays he is not noticing the pain when he is putting on his pants anymore. No pain upon arrival today but still with pain at times with certain movements    Diagnostic tests MSK US Impression: "Supple biceps tendinitis subacromial bursitis tendinopathy subscapularis tendon"    Patient Stated Goals reduce pain    Pain Onset More than a month ago              [facility] PT Assessment - 05/25/20 0001      Assessment   Medical Diagnosis M25.511 (ICD-10-CM) - Acute pain of right shoulder     Referring Provider (PT) Gregor Hams, MD    Hand Dominance Right      AROM   Right Shoulder Flexion --   WNL   Right Shoulder ABduction --   WNL   Right Shoulder Internal Rotation --   L3 behind back   Right Shoulder External Rotation --   WNL     Strength   Right Shoulder Flexion 4+/5    Right Shoulder Extension 4+/5    Right Shoulder ABduction 4+/5    Right Shoulder Internal Rotation 4+/5    Right Shoulder External Rotation 4+/5    Right Shoulder Horizontal ABduction 4+/5            [facility] Adult PT Treatment/Exercise - 05/25/20 0001      Shoulder Exercises: Sidelying   External Rotation Right    External Rotation Weight (lbs) 3    External Rotation Limitations 2X15    ABduction Right    ABduction Weight (lbs) 3    ABduction Limitations 2X15      Shoulder Exercises: Standing   Other Standing Exercises with 3# weight bar IR behind back and extension behind back, and OH bilat shoulder press X 20 reps ea    Other Standing Exercises bent over rows 5 lbs  3X15, bent over extensions 3 lbs 3X15, bent over Y and T with 3 lbs 2X15      Shoulder Exercises: Pulleys   Flexion 2 minutes    ABduction 2 minutes      Shoulder Exercises: Stretch   Corner Stretch Limitations doorway stretch 10 sec X 10 reps    Internal Rotation Stretch Limitations 10 sec X 10 reps with strap behind back                       PT Long Term Goals - 05/25/20 1053      PT LONG TERM GOAL #1   Title Pt will be I and compliant with HEP.    Baseline compliant with starter HEP    Time 6    Period Weeks    Status On-going      PT LONG TERM GOAL #2   Title Pt will report no more than 2-3/10 pain with usual acitvity.    Time 6    Period Weeks    Status On-going      PT LONG TERM GOAL #3   Title Pt will improve Rt shoulder ROM to WNL and equal to Lt side.    Baseline met 9/9    Time 6    Period Weeks    Status Achieved      PT LONG TERM GOAL #4   Title Pt will improve Rt shoulder  strength to 5/5 MMT to improve function.    Baseline 4+ on 9/9    Time 6    Period Weeks    Status On-going                 Plan - 05/25/20 1048    Clinical Impression Statement He was able to show improvments in Rt shoulder strength and stability today and able to progress his weight with strength training. Continue POC    Personal Factors and Comorbidities Past/Current Experience;Time since onset of injury/illness/exacerbation    Examination-Activity Limitations Carry;Lift;Dressing    Examination-Participation Restrictions Driving;Yard Work    Stability/Clinical Decision Making Stable/Uncomplicated    Rehab Potential Good    PT Frequency 2x / week   1-2   PT Duration 6 weeks    PT Treatment/Interventions ADLs/Self Care Home Management;Cryotherapy;Electrical Stimulation;Iontophoresis 65m/ml Dexamethasone;Moist Heat;Traction;Ultrasound;Neuromuscular re-education;Therapeutic exercise;Therapeutic activities;Manual techniques;Passive range of motion;Taping;Vasopneumatic Device;Joint Manipulations    PT Next Visit Plan Needs IR ROM, RTC strenght and scap stability. modalaties PRN    PT Home Exercise Plan Access Code: [email or web address]   Consulted and Agree with Plan of Care Patient           Patient will benefit from skilled therapeutic intervention in order to improve the following deficits and impairments:  Decreased activity tolerance, Decreased mobility, Decreased strength, Decreased range of motion, Hypomobility, Postural dysfunction, Impaired flexibility, Pain, Increased fascial restricitons  Visit Diagnosis: Chronic right shoulder pain  Muscle weakness (generalized)  Localized edema     Problem List Patient Active Problem List   Diagnosis Date Noted  . Laryngopharyngeal reflux (LPR) 03/24/2018  . Throat clearing 03/24/2018  . Aortic valve regurgitation 03/04/2017  . Depression 01/17/2017  . Major depressive disorder 04/21/2014  . Plantar fasciitis 12/24/2013  .  Achilles tendonitis 12/14/2013  . Pronation deformity of ankle, acquired 12/14/2013  . Pain in lower limb 12/14/2013  . Recurrent major depression resistant to treatment ([place] 01/16/2012  . SOB (shortness of breath) 10/30/2011  . Aortic root dilatation ([place] 10/30/2011  . Abnormal  stress test 10/22/2011  . Chest pain 10/22/2011  . TESTOSTERONE DEFICIENCY 04/19/2008  . ANXIETY 04/28/2007  . INSOMNIA, PERSISTENT 04/28/2007  . ADHD 04/28/2007  . GERD 04/28/2007    [name] 05/25/2020, 10:55 AM  [facility] Physical Therapy [address] [place], [place], [address] Phone: [phone]   Fax:  [phone]  Name: George Proctor MRN: [id number] Date of Birth: [birth year]

## 2020-05-29 ENCOUNTER — Encounter: Payer: 59 | Admitting: Physical Therapy

## 2020-05-29 DIAGNOSIS — R69 Illness, unspecified: Secondary | ICD-10-CM | POA: Diagnosis not present

## 2020-05-29 DIAGNOSIS — F900 Attention-deficit hyperactivity disorder, predominantly inattentive type: Secondary | ICD-10-CM | POA: Diagnosis not present

## 2020-05-29 DIAGNOSIS — F411 Generalized anxiety disorder: Secondary | ICD-10-CM | POA: Diagnosis not present

## 2020-05-31 ENCOUNTER — Other Ambulatory Visit: Payer: Self-pay | Admitting: Internal Medicine

## 2020-05-31 DIAGNOSIS — K5900 Constipation, unspecified: Secondary | ICD-10-CM | POA: Diagnosis not present

## 2020-05-31 DIAGNOSIS — R69 Illness, unspecified: Secondary | ICD-10-CM | POA: Diagnosis not present

## 2020-05-31 DIAGNOSIS — R1904 Left lower quadrant abdominal swelling, mass and lump: Secondary | ICD-10-CM

## 2020-06-01 ENCOUNTER — Encounter: Payer: 59 | Admitting: Physical Therapy

## 2020-06-07 DIAGNOSIS — R1907 Generalized intra-abdominal and pelvic swelling, mass and lump: Secondary | ICD-10-CM | POA: Diagnosis not present

## 2020-06-07 DIAGNOSIS — R1084 Generalized abdominal pain: Secondary | ICD-10-CM | POA: Diagnosis not present

## 2020-06-07 DIAGNOSIS — K5900 Constipation, unspecified: Secondary | ICD-10-CM | POA: Diagnosis not present

## 2020-06-07 DIAGNOSIS — R933 Abnormal findings on diagnostic imaging of other parts of digestive tract: Secondary | ICD-10-CM | POA: Diagnosis not present

## 2020-06-09 ENCOUNTER — Telehealth: Payer: Self-pay | Admitting: Hematology

## 2020-06-09 NOTE — Telephone Encounter (Signed)
Called pt per 9/24 sch message - no answer , unable to reach pt.

## 2020-06-12 ENCOUNTER — Other Ambulatory Visit: Payer: 59

## 2020-06-12 ENCOUNTER — Telehealth: Payer: Self-pay | Admitting: Hematology and Oncology

## 2020-06-12 ENCOUNTER — Ambulatory Visit: Payer: 59 | Admitting: Hematology and Oncology

## 2020-06-12 NOTE — Telephone Encounter (Signed)
error 

## 2020-06-14 DIAGNOSIS — F900 Attention-deficit hyperactivity disorder, predominantly inattentive type: Secondary | ICD-10-CM | POA: Diagnosis not present

## 2020-06-14 DIAGNOSIS — R69 Illness, unspecified: Secondary | ICD-10-CM | POA: Diagnosis not present

## 2020-06-14 DIAGNOSIS — F411 Generalized anxiety disorder: Secondary | ICD-10-CM | POA: Diagnosis not present

## 2020-06-15 ENCOUNTER — Ambulatory Visit: Payer: 59 | Admitting: Hematology

## 2020-06-15 ENCOUNTER — Other Ambulatory Visit: Payer: 59

## 2020-06-15 ENCOUNTER — Inpatient Hospital Stay: Payer: 59

## 2020-06-15 ENCOUNTER — Other Ambulatory Visit: Payer: Self-pay

## 2020-06-15 ENCOUNTER — Inpatient Hospital Stay: Payer: 59 | Attending: Hematology | Admitting: Hematology

## 2020-06-15 VITALS — BP 133/97 | HR 123 | Temp 97.2°F | Resp 18 | Ht 73.0 in | Wt 186.2 lb

## 2020-06-15 DIAGNOSIS — R112 Nausea with vomiting, unspecified: Secondary | ICD-10-CM | POA: Insufficient documentation

## 2020-06-15 DIAGNOSIS — M549 Dorsalgia, unspecified: Secondary | ICD-10-CM | POA: Diagnosis not present

## 2020-06-15 DIAGNOSIS — G8929 Other chronic pain: Secondary | ICD-10-CM | POA: Diagnosis not present

## 2020-06-15 DIAGNOSIS — R5383 Other fatigue: Secondary | ICD-10-CM | POA: Insufficient documentation

## 2020-06-15 DIAGNOSIS — R1013 Epigastric pain: Secondary | ICD-10-CM | POA: Diagnosis not present

## 2020-06-15 DIAGNOSIS — R162 Hepatomegaly with splenomegaly, not elsewhere classified: Secondary | ICD-10-CM

## 2020-06-15 DIAGNOSIS — F329 Major depressive disorder, single episode, unspecified: Secondary | ICD-10-CM | POA: Insufficient documentation

## 2020-06-15 DIAGNOSIS — Z803 Family history of malignant neoplasm of breast: Secondary | ICD-10-CM | POA: Insufficient documentation

## 2020-06-15 DIAGNOSIS — R591 Generalized enlarged lymph nodes: Secondary | ICD-10-CM

## 2020-06-15 DIAGNOSIS — R61 Generalized hyperhidrosis: Secondary | ICD-10-CM | POA: Diagnosis not present

## 2020-06-15 DIAGNOSIS — R69 Illness, unspecified: Secondary | ICD-10-CM | POA: Diagnosis not present

## 2020-06-15 DIAGNOSIS — Z8249 Family history of ischemic heart disease and other diseases of the circulatory system: Secondary | ICD-10-CM | POA: Diagnosis not present

## 2020-06-15 DIAGNOSIS — Z79899 Other long term (current) drug therapy: Secondary | ICD-10-CM | POA: Insufficient documentation

## 2020-06-15 DIAGNOSIS — R1900 Intra-abdominal and pelvic swelling, mass and lump, unspecified site: Secondary | ICD-10-CM | POA: Diagnosis not present

## 2020-06-15 DIAGNOSIS — R630 Anorexia: Secondary | ICD-10-CM | POA: Insufficient documentation

## 2020-06-15 DIAGNOSIS — Z818 Family history of other mental and behavioral disorders: Secondary | ICD-10-CM | POA: Diagnosis not present

## 2020-06-15 DIAGNOSIS — R0602 Shortness of breath: Secondary | ICD-10-CM | POA: Diagnosis not present

## 2020-06-15 LAB — CMP (CANCER CENTER ONLY)
ALT: 17 U/L (ref 0–44)
AST: 87 U/L — ABNORMAL HIGH (ref 15–41)
Albumin: 3.5 g/dL (ref 3.5–5.0)
Alkaline Phosphatase: 153 U/L — ABNORMAL HIGH (ref 38–126)
Anion gap: 12 (ref 5–15)
BUN: 10 mg/dL (ref 8–23)
CO2: 26 mmol/L (ref 22–32)
Calcium: 9.3 mg/dL (ref 8.9–10.3)
Chloride: 100 mmol/L (ref 98–111)
Creatinine: 1.06 mg/dL (ref 0.61–1.24)
GFR, Est AFR Am: >60 mL/min (ref >60)
GFR, Estimated: >60 mL/min (ref >60)
Glucose, Bld: 82 mg/dL (ref 70–99)
Potassium: 4.3 mmol/L (ref 3.5–5.1)
Sodium: 138 mmol/L (ref 135–145)
Total Bilirubin: 0.5 mg/dL (ref 0.3–1.2)
Total Protein: 6.1 g/dL — ABNORMAL LOW (ref 6.5–8.1)

## 2020-06-15 LAB — CBC WITH DIFFERENTIAL/PLATELET
Abs Immature Granulocytes: 0.02 10*3/uL (ref 0.00–0.07)
Basophils Absolute: 0 10*3/uL (ref 0.0–0.1)
Basophils Relative: 0 %
Eosinophils Absolute: 0.1 10*3/uL (ref 0.0–0.5)
Eosinophils Relative: 2 %
HCT: 42.9 % (ref 39.0–52.0)
Hemoglobin: 14.4 g/dL (ref 13.0–17.0)
Immature Granulocytes: 0 %
Lymphocytes Relative: 5 %
Lymphs Abs: 0.3 10*3/uL — ABNORMAL LOW (ref 0.7–4.0)
MCH: 28.6 pg (ref 26.0–34.0)
MCHC: 33.6 g/dL (ref 30.0–36.0)
MCV: 85.3 fL (ref 80.0–100.0)
Monocytes Absolute: 0.5 10*3/uL (ref 0.1–1.0)
Monocytes Relative: 10 %
Neutro Abs: 4.4 10*3/uL (ref 1.7–7.7)
Neutrophils Relative %: 83 %
Platelets: 239 10*3/uL (ref 150–400)
RBC: 5.03 MIL/uL (ref 4.22–5.81)
RDW: 13.4 % (ref 11.5–15.5)
WBC: 5.4 10*3/uL (ref 4.0–10.5)
nRBC: 0 % (ref 0.0–0.2)

## 2020-06-15 LAB — HEPATITIS C ANTIBODY: HCV Ab: NONREACTIVE

## 2020-06-15 LAB — HEPATITIS B SURFACE ANTIGEN: Hepatitis B Surface Ag: NONREACTIVE

## 2020-06-15 LAB — HEPATITIS B CORE ANTIBODY, TOTAL: Hep B Core Total Ab: NONREACTIVE

## 2020-06-15 LAB — HIV ANTIBODY (ROUTINE TESTING W REFLEX): HIV Screen 4th Generation wRfx: NONREACTIVE

## 2020-06-15 LAB — LACTATE DEHYDROGENASE: LDH: 1052 U/L — ABNORMAL HIGH (ref 98–192)

## 2020-06-15 NOTE — Patient Instructions (Signed)
Thank you for choosing [facility] to provide your oncology and hematology care.   Should you have questions after your visit to the [facility] ([facility]), please contact this office at [phone] between 8:30 AM and 4:30 PM.  Voice mails left after 4:00 PM may not be returned until the following business day.  Calls received after 4:30 PM will be answered by an off-site Nurse Triage Line.    Prescription Refills:  Please have your pharmacy contact us directly for most prescription requests.  Contact the office directly for refills of narcotics (pain medications). Allow 48-72 hours for refills.  Appointments: Please contact the [facility] scheduling department [phone] for questions regarding [facility] appointment scheduling.  Contact the schedulers with any scheduling changes so that your appointment can be rescheduled in a timely manner.   Central Scheduling for [place] [phone] - Call to schedule procedures such as PET scans, CT scans, MRI, Ultrasound, etc.  To afford each patient quality time with our providers, please arrive 30 minutes before your scheduled appointment time.  If you arrive late for your appointment, you may be asked to reschedule.  We strive to give you quality time with our providers, and arriving late affects you and other patients whose appointments are after yours. If you are a no show for multiple scheduled visits, you may be dismissed from the clinic at the providers discretion.     Resources: [facility] Social Workers [phone] for additional information on assistance programs or assistance connecting with community support programs   [place] DSS  [phone]: Information regarding food stamps, Medicaid, and utility assistance [place] [employer] [place] [phone]   [place] [employer]'s shared-ride transportation service for eligible riders who have a disability that prevents them from riding the fixed route bus.   Medicare  Rights Center 800-333-4114 Helps people with Medicare understand their rights and benefits, navigate the Medicare system, and secure the quality healthcare they deserve [employer] 800-227-2345 Assists patients locate various types of support and financial assistance Cancer Care: 1-800-813-HOPE (4673) Provides financial assistance, online support groups, medication/co-pay assistance.   Transportation Assistance for appointments at [facility]: Transportation Coordinator [phone]  Again, thank you for choosing [facility] for your care.       

## 2020-06-15 NOTE — Progress Notes (Signed)
HEMATOLOGY/ONCOLOGY CONSULTATION NOTE  Date of Service: 06/15/2020  Patient Care Team: [name], MD as PCP - General (Internal Medicine) Debara Pickett Nadean Corwin, MD as PCP - Cardiology (Cardiology) [name], MD (Internal Medicine)  CHIEF COMPLAINTS/PURPOSE OF CONSULTATION:  Probable widespread lymphoma - palpable mass in his abdomen.   HISTORY OF PRESENTING ILLNESS:   George Proctor is a wonderful 64 y.o. male who has been referred to [place] by Dr. Shelia Media for evaluation and management of possible wide-spread lymphoma - palpable mass in abdomen. Pt is accompanied today by his wife. The pt reports that he is doing well overall.   The pt reports that about three weeks ago he began experiencing abdominal fullness. Two weeks ago he began to feel abdominal pain. Lying down makes this discomfort worse. He has not had a restful sleep in two weeks. The abdominal fullness has caused lack of appetite, belching, and one episode of nausea and vomiting. He is currently eating 1/3-1/2 of his baseline. Pt has been taking Tramadol to control his pain, as well as a stool softener. His bowel movements look different, but he denies any constipation. He is hot natured and has mild night sweats, but denies drenching night sweats. Pt feels that he has been short of breath for awhile, but that this may have worsened lately. Pt follows with Dr. Benson Norway, GI.   He had abdominal surgery as a child after an accident. Pt has had GI bleeding and ulcers from taking NSAIDS. Pt has had several instances of abdominal swelling that were triggered by illness. Pt had esophageal dialation. His strictures were thought to be caused by acid reflux. He also has an umbilical hernia. He has chronic back pain. Pt was diagnosed with chronic lyme disease by a [place]. He has a history of balance issues and has brought this up to his PCP, who did not feel that it required a work up. He does not have a true allergy, but  experiences nausea and vomiting when taking Omeprazole. Pt denies any international travel in the last year.   Pt has been taking weekly Testosterone for 3-4 months. He was experiencing fatigue prior to beginning the hormonal injections. Pt was placed on Latuda & Lamictil several weeks ago for his Depression and Anxiety, but discontinued a week ago as he did not see any improvement in mood. He is not currently following with a mental health professional.   Dr. Shelia Media referred pt to Dr. Redmond Pulling at [facility] for biopsy consideration. Pt is scheduled to see Dr. Shelia Media next Wednesday.   Of note prior to the patient's visit today, pt has had  CT Abd/Pel completed on 06/05/2020 with results revealing numerous cardiophrenic, retroperitoneal and mesenteric adenopathy most consistent with lymphoma. Small volume of ascites. Hepatosplenomegaly.  Most recent lab results (03/22/2020) of CBC is as follows: all values are WNL except for WBC at 3.5K, PLT at 131K, Lymphs Rel at 17.6, Total Protein at 6.3.  On review of systems, pt reports worsening SOB, abdominal pain, abdominal fullness, low appetite, dyspepsia, mild night sweats and denies fevers, chills, rash, headaches, leg swelling, urinary habit changes, constipation, testicular pain/swelling and any other symptoms.   On PMHx the pt reports Depression, Anxiety, Abdominal Surgery, Gait difficulty, Esophageal dilation. On Family Hx the pt reports that his father and paternal aunt had polymyalgia rheumatica. His paternal aunt also had breast cancer.  MEDICAL HISTORY:  Past Medical History:  Diagnosis Date  . Anxiety   . Depression   .  Gait difficulty   . Memory loss     SURGICAL HISTORY: Past Surgical History:  Procedure Laterality Date  . ABDOMINAL SURGERY     Childhood  . BUNIONECTOMY      SOCIAL HISTORY: Social History   Socioeconomic History  . Marital status: Married    Spouse name: Not on file  . Number of children: 0  .  Years of education: Veterinary surgeon  . Highest education level: Not on file  Occupational History  . Occupation: Scientist, research (physical sciences)  Tobacco Use  . Smoking status: Never Smoker  . Smokeless tobacco: Never Used  Vaping Use  . Vaping Use: Never used  Substance and Sexual Activity  . Alcohol use: No    Alcohol/week: 0.0 standard drinks    Comment: Less than one drink per week.  . Drug use: No  . Sexual activity: Not Currently  Other Topics Concern  . Not on file  Social History Narrative   Lives at home with wife.   Right-handed.   3 cups caffeine per day.   Social Determinants of Health   Financial Resource Strain:   . Difficulty of Paying Living Expenses: Not on file  Food Insecurity:   . Worried About Charity fundraiser in the Last Year: Not on file  . Ran Out of Food in the Last Year: Not on file  Transportation Needs:   . Lack of Transportation (Medical): Not on file  . Lack of Transportation (Non-Medical): Not on file  Physical Activity:   . Days of Exercise per Week: Not on file  . Minutes of Exercise per Session: Not on file  Stress:   . Feeling of Stress : Not on file  Social Connections:   . Frequency of Communication with Friends and Family: Not on file  . Frequency of Social Gatherings with Friends and Family: Not on file  . Attends Religious Services: Not on file  . Active Member of Clubs or Organizations: Not on file  . Attends Archivist Meetings: Not on file  . Marital Status: Not on file  Intimate Partner Violence:   . Fear of Current or Ex-Partner: Not on file  . Emotionally Abused: Not on file  . Physically Abused: Not on file  . Sexually Abused: Not on file    FAMILY HISTORY: Family History  Problem Relation Age of Onset  . Aneurysm Mother 69       Thoracic  . Hypertension Father   . Heart failure Father   . Heart disease Father   . Heart attack Paternal Grandfather   . Depression Cousin   . Bipolar disorder Cousin     . Schizophrenia Maternal Uncle     ALLERGIES:  is allergic to omeprazole.  MEDICATIONS:  Current Outpatient Medications  Medication Sig Dispense Refill  . Ascorbic Acid (VITAMIN C PO) Take 2,000 mg by mouth.     . B Complex Vitamins (VITAMIN B COMPLEX PO) Take by mouth.    . fish oil-omega-3 fatty acids 1000 MG capsule Take 6 g by mouth daily.     Marland Kitchen lamoTRIgine Starter Kit-Orange 42 x 25 MG & 7 x 100 MG KIT SMARTSIG:Packet(s) By Mouth As Directed    . lurasidone (LATUDA) 20 MG TABS tablet Take 20 mg by mouth daily.    . Multiple Vitamin (MULTIVITAMIN) capsule Take 1 capsule by mouth daily.    . pantoprazole (PROTONIX) 40 MG tablet     . testosterone cypionate (DEPOTESTOSTERONE CYPIONATE) 200 MG/ML  injection SMARTSIG:0.5 Milliliter(s) IM Every 2 Weeks     No current facility-administered medications for this visit.    REVIEW OF SYSTEMS:    10 Point review of Systems was done is negative except as noted above.  PHYSICAL EXAMINATION: ECOG PERFORMANCE STATUS: 2 - Symptomatic, <50% confined to bed  . Vitals:   06/15/20 1428  BP: (!) 133/97  Pulse: (!) 123  Resp: 18  Temp: (!) 97.2 F (36.2 C)  SpO2: 96%   Filed Weights   06/15/20 1428  Weight: 186 lb 3.2 oz (84.5 kg)   .Body mass index is 24.57 kg/m.  GENERAL:alert, in no acute distress and comfortable SKIN: no acute rashes, no significant lesions EYES: conjunctiva are pink and non-injected, sclera anicteric OROPHARYNX: MMM, no exudates, no oropharyngeal erythema or ulceration NECK: supple, no JVD LYMPH:  no palpable lymphadenopathy in the cervical and axillary regions. Several inguinal lymph nodes b/l. LUNGS: clear to auscultation b/l with normal respiratory effort HEART: regular rate & rhythm ABDOMEN:  normoactive bowel sounds , non tender, not distended. Extremity: no pedal edema PSYCH: alert & oriented x 3 with fluent speech NEURO: no focal motor/sensory deficits  LABORATORY DATA:  I have reviewed the data  as listed  . CBC Latest Ref Rng & Units 11/17/2017 11/27/2007 11/03/2007  WBC 4.0 - 10.5 K/uL 5.7 7.7 5.7  Hemoglobin 13.0 - 17.0 g/dL 14.9 15.3 15.3  Hematocrit 39 - 52 % 45.4 43.9 45.0  Platelets 150 - 400 K/uL 219 271 222    . CMP Latest Ref Rng & Units 11/17/2017 11/27/2007 11/03/2007  Glucose 65 - 99 mg/dL 116(H) 105(H) 101(H)  BUN 6 - 20 mg/dL '9 12 10  ' Creatinine 0.61 - 1.24 mg/dL 1.00 1.04 0.9  Sodium 135 - 145 mmol/L 141 141 144  Potassium 3.5 - 5.1 mmol/L 3.9 4.1 4.8  Chloride 101 - 111 mmol/L 104 105 108  CO2 22 - 32 mmol/L '25 29 30  ' Calcium 8.9 - 10.3 mg/dL 9.3 9.9 9.8  Total Protein - - 6.7 6.4  Total Bilirubin - - 1.0 1.1  Alkaline Phos - - 57 59  AST - - 21 25  ALT - - 23 32     RADIOGRAPHIC STUDIES: I have personally reviewed the radiological images as listed and agreed with the findings in the report. No results found.     ASSESSMENT & PLAN:   64 yo with   1) Newly diagnosed extensive abdominal , retroperitoneal LNpathy highly concerning for/suggestive of lymphoma PLAN: -Discussed patient's most recent labs from 03/22/2020, all values are WNL except for WBC at 3.5K, PLT at 131K, Lymphs Rel at 17.6, Total Protein at 6.3. -Discussed 06/05/2020 CT Abd/Pel which revealed numerous cardiophrenic, retroperitoneal and mesenteric adenopathy most consistent with lymphoma. Small volume of ascites. Hepatosplenomegaly. -Advised pt that an enlarged spleen, liver, and lymph nodes suggest a lymphoma. -Advised pt that an inflammatory or infectious process could cause the changes evidenced on the CT scan, but this is less likely.  -Advised pt that his rapidly progressing symptoms suggest a higher-grade lymphoma, but does not r/o a slow-growing process.  -Advised pt that a Surgical LN Bx would provide the most accurate result, but that a CT guided biopsy would be less invasive.  -Advised pt that Testosterone replacement can increase the risk of blood clots. -Recommend pt  supplement nutritional intake with Boost or Ensure supplements. -Recommend pt take 1 Tramadol 2-3 times per day to prevent wooziness. -Will get CT guided Bx external iliac or  retroperitoneal LN -Will get PET/CT in 3-5 days -Will get labs today    FOLLOW UP: Labs today PET/CT stat in 3-5 days CT guided biopsy of external iliac or retroperitoneal LN for lymphoma workup   All of the patients questions were answered with apparent satisfaction. The patient knows to call the clinic with any problems, questions or concerns.  I spent 45 mins counseling the patient face to face. The total time spent in the appointment was 60 minutes and more than 50% was on counseling and direct patient cares.    Sullivan Lone MD [place] AAHIVMS [facility] Hematology/Oncology Physician [facility]  (Office):       [phone] (Work cell):  [phone] (Fax):           [phone]  06/15/2020 12:58 PM  I, Yevette Edwards, am acting as a scribe for Dr. Sullivan Lone.   .I have reviewed the above documentation for accuracy and completeness, and I agree with the above. Brunetta Genera MD

## 2020-06-16 ENCOUNTER — Telehealth: Payer: Self-pay

## 2020-06-16 NOTE — Telephone Encounter (Signed)
Received call from pt. regarding his PET CT appt. scheduled for 06/27/2020. Dr. [name] ordered this STAT and would like it done earlier. Called Nuclear Medicine on [phone] but they were not available today and will be back on Monday.  Sandi to follow up on Monday for an earlier appt.

## 2020-06-19 ENCOUNTER — Telehealth: Payer: Self-pay | Admitting: *Deleted

## 2020-06-19 NOTE — Telephone Encounter (Signed)
Patient contacted office - asked if PET currently scheduled on 10/12 could be scheduled any sooner. Contacted CM for authorization of stat PET to facilitate scheduling sooner. PET authorized.  Contacted [facility] -- PET now scheduled for 7am 10/6 at [facility] - per nuclear med: pt to arrive @ 0630 @ [facility]; Nothing to eat 6hrs prior - pt can have water.  Contacted patient with new time for PET. Patient verbalized understanding.

## 2020-06-21 ENCOUNTER — Other Ambulatory Visit: Payer: Self-pay | Admitting: Hematology

## 2020-06-21 ENCOUNTER — Encounter (HOSPITAL_COMMUNITY)
Admission: RE | Admit: 2020-06-21 | Discharge: 2020-06-21 | Disposition: A | Discharge status: Home / Self Care | Payer: 59 | Source: Ambulatory Visit | Attending: Hematology | Admitting: Hematology

## 2020-06-21 ENCOUNTER — Other Ambulatory Visit: Payer: Self-pay

## 2020-06-21 ENCOUNTER — Encounter (HOSPITAL_COMMUNITY): Payer: Self-pay

## 2020-06-21 DIAGNOSIS — Z20822 Contact with and (suspected) exposure to covid-19: Secondary | ICD-10-CM | POA: Diagnosis not present

## 2020-06-21 DIAGNOSIS — Z5111 Encounter for antineoplastic chemotherapy: Secondary | ICD-10-CM | POA: Diagnosis not present

## 2020-06-21 DIAGNOSIS — Z818 Family history of other mental and behavioral disorders: Secondary | ICD-10-CM | POA: Diagnosis not present

## 2020-06-21 DIAGNOSIS — R162 Hepatomegaly with splenomegaly, not elsewhere classified: Secondary | ICD-10-CM

## 2020-06-21 DIAGNOSIS — Z79890 Hormone replacement therapy (postmenopausal): Secondary | ICD-10-CM | POA: Diagnosis not present

## 2020-06-21 DIAGNOSIS — R69 Illness, unspecified: Secondary | ICD-10-CM | POA: Diagnosis not present

## 2020-06-21 DIAGNOSIS — R590 Localized enlarged lymph nodes: Secondary | ICD-10-CM | POA: Diagnosis not present

## 2020-06-21 DIAGNOSIS — J940 Chylous effusion: Secondary | ICD-10-CM | POA: Diagnosis present

## 2020-06-21 DIAGNOSIS — R609 Edema, unspecified: Secondary | ICD-10-CM | POA: Diagnosis not present

## 2020-06-21 DIAGNOSIS — F411 Generalized anxiety disorder: Secondary | ICD-10-CM | POA: Diagnosis not present

## 2020-06-21 DIAGNOSIS — R188 Other ascites: Secondary | ICD-10-CM | POA: Diagnosis not present

## 2020-06-21 DIAGNOSIS — K219 Gastro-esophageal reflux disease without esophagitis: Secondary | ICD-10-CM | POA: Diagnosis not present

## 2020-06-21 DIAGNOSIS — R0602 Shortness of breath: Secondary | ICD-10-CM | POA: Diagnosis not present

## 2020-06-21 DIAGNOSIS — F419 Anxiety disorder, unspecified: Secondary | ICD-10-CM | POA: Diagnosis present

## 2020-06-21 DIAGNOSIS — F329 Major depressive disorder, single episode, unspecified: Secondary | ICD-10-CM | POA: Diagnosis not present

## 2020-06-21 DIAGNOSIS — C8598 Non-Hodgkin lymphoma, unspecified, lymph nodes of multiple sites: Secondary | ICD-10-CM | POA: Diagnosis not present

## 2020-06-21 DIAGNOSIS — C786 Secondary malignant neoplasm of retroperitoneum and peritoneum: Secondary | ICD-10-CM | POA: Diagnosis not present

## 2020-06-21 DIAGNOSIS — F909 Attention-deficit hyperactivity disorder, unspecified type: Secondary | ICD-10-CM | POA: Diagnosis present

## 2020-06-21 DIAGNOSIS — J9811 Atelectasis: Secondary | ICD-10-CM | POA: Diagnosis not present

## 2020-06-21 DIAGNOSIS — R1084 Generalized abdominal pain: Secondary | ICD-10-CM | POA: Diagnosis not present

## 2020-06-21 DIAGNOSIS — Z452 Encounter for adjustment and management of vascular access device: Secondary | ICD-10-CM | POA: Diagnosis not present

## 2020-06-21 DIAGNOSIS — R591 Generalized enlarged lymph nodes: Secondary | ICD-10-CM | POA: Insufficient documentation

## 2020-06-21 DIAGNOSIS — I700 Atherosclerosis of aorta: Secondary | ICD-10-CM | POA: Diagnosis present

## 2020-06-21 DIAGNOSIS — F339 Major depressive disorder, recurrent, unspecified: Secondary | ICD-10-CM | POA: Diagnosis present

## 2020-06-21 DIAGNOSIS — G4700 Insomnia, unspecified: Secondary | ICD-10-CM | POA: Diagnosis present

## 2020-06-21 DIAGNOSIS — K429 Umbilical hernia without obstruction or gangrene: Secondary | ICD-10-CM | POA: Diagnosis present

## 2020-06-21 DIAGNOSIS — J90 Pleural effusion, not elsewhere classified: Secondary | ICD-10-CM | POA: Diagnosis not present

## 2020-06-21 DIAGNOSIS — F900 Attention-deficit hyperactivity disorder, predominantly inattentive type: Secondary | ICD-10-CM | POA: Diagnosis not present

## 2020-06-21 DIAGNOSIS — R109 Unspecified abdominal pain: Secondary | ICD-10-CM | POA: Diagnosis not present

## 2020-06-21 DIAGNOSIS — N1330 Unspecified hydronephrosis: Secondary | ICD-10-CM | POA: Diagnosis not present

## 2020-06-21 DIAGNOSIS — E883 Tumor lysis syndrome: Secondary | ICD-10-CM | POA: Diagnosis not present

## 2020-06-21 DIAGNOSIS — Z888 Allergy status to other drugs, medicaments and biological substances status: Secondary | ICD-10-CM | POA: Diagnosis not present

## 2020-06-21 DIAGNOSIS — C8243 Follicular lymphoma grade IIIb, intra-abdominal lymph nodes: Secondary | ICD-10-CM | POA: Diagnosis not present

## 2020-06-21 DIAGNOSIS — J9601 Acute respiratory failure with hypoxia: Secondary | ICD-10-CM | POA: Diagnosis not present

## 2020-06-21 DIAGNOSIS — Z23 Encounter for immunization: Secondary | ICD-10-CM | POA: Diagnosis not present

## 2020-06-21 DIAGNOSIS — C8590 Non-Hodgkin lymphoma, unspecified, unspecified site: Secondary | ICD-10-CM | POA: Diagnosis not present

## 2020-06-21 DIAGNOSIS — C8248 Follicular lymphoma grade IIIb, lymph nodes of multiple sites: Secondary | ICD-10-CM | POA: Diagnosis not present

## 2020-06-21 DIAGNOSIS — H9190 Unspecified hearing loss, unspecified ear: Secondary | ICD-10-CM | POA: Diagnosis present

## 2020-06-21 DIAGNOSIS — Z79899 Other long term (current) drug therapy: Secondary | ICD-10-CM | POA: Diagnosis not present

## 2020-06-21 LAB — GLUCOSE, CAPILLARY: Glucose-Capillary: 90 mg/dL (ref 70–99)

## 2020-06-21 MED ORDER — SENNOSIDES-DOCUSATE SODIUM 8.6-50 MG PO TABS: 2.0000 | ORAL_TABLET | Freq: Every day | ORAL | 1 refills | Status: DC

## 2020-06-21 MED ORDER — OXYCODONE HCL 5 MG PO TABS: 5.0000 mg | ORAL_TABLET | ORAL | 0 refills | Status: DC | PRN

## 2020-06-21 MED ORDER — POLYETHYLENE GLYCOL 3350 17 G PO PACK: 17.0000 g | PACK | Freq: Every day | ORAL | 1 refills | Status: DC

## 2020-06-21 MED ORDER — FLUDEOXYGLUCOSE F - 18 (FDG) INJECTION
9.5000 | Freq: Once | INTRAVENOUS | Status: AC | PRN
  Administered 2020-06-21: 9.5 via INTRAVENOUS

## 2020-06-21 NOTE — Progress Notes (Unsigned)
RB 1  George Proctor Male, 64 y.o., 1956/02/01 MRN:  [id number] Phone:  [phone] Jerilynn Mages) PCP:  [name], MD Coverage:  [employer] Nap Next Appt With Radiology ([facility]-CT 1) 06/26/2020 at 9:00 AM  RE: Biopsy Received: Yesterday Message Details  Arne Cleveland, MD  Lenore Cordia Ok   CT core mesenteric LAN  See Im 92 Se 2 CT 06/05/20   DD[facility]   Previous Messages  ----- Message -----  From: Lenore Cordia  Sent: 06/20/2020  4:02 PM EDT  To: Ir Procedure Requests  Subject: Biopsy                      [facility] Images have been uploaded to canopy  Accession Number [id number]  ----- Message -----  From: Arne Cleveland, MD  Sent: 06/19/2020 10:01 AM EDT  To: Lenore Cordia  Subject: RE: Biopsy                    They need to load the [facility] CT 2021 for review   [facility]     ----- Message -----  From: Lenore Cordia  Sent: 06/16/2020  2:33 PM EDT  To: Ir Procedure Requests  Subject: Biopsy                      Procedure Requested: STAT  CT Biopsy    Reason for Procedure: 64 yo with massive intra abd, retroperitoneal , pelvic LNadenopathy and hepatosplenomegaly for biopsy of external iliac or retroperitoneal or mesenteric mass likely lymphoma for diagnosis/workup    Provider Requesting: Brunetta Genera  Provider Telephone: [phone]    Other Info: pet scheduled for 10/12

## 2020-06-22 ENCOUNTER — Other Ambulatory Visit: Payer: Self-pay | Admitting: Radiology

## 2020-06-22 DIAGNOSIS — F411 Generalized anxiety disorder: Secondary | ICD-10-CM | POA: Diagnosis not present

## 2020-06-22 DIAGNOSIS — R69 Illness, unspecified: Secondary | ICD-10-CM | POA: Diagnosis not present

## 2020-06-22 DIAGNOSIS — F900 Attention-deficit hyperactivity disorder, predominantly inattentive type: Secondary | ICD-10-CM | POA: Diagnosis not present

## 2020-06-24 ENCOUNTER — Emergency Department (HOSPITAL_COMMUNITY): Payer: 59

## 2020-06-24 ENCOUNTER — Encounter (HOSPITAL_COMMUNITY): Payer: Self-pay | Admitting: *Deleted

## 2020-06-24 ENCOUNTER — Other Ambulatory Visit: Payer: Self-pay

## 2020-06-24 ENCOUNTER — Inpatient Hospital Stay (HOSPITAL_COMMUNITY)
Admission: EM | Admit: 2020-06-24 | Discharge: 2020-07-02 | DRG: 823 | Disposition: A | Discharge status: Home / Self Care | Payer: 59 | Attending: Family Medicine | Admitting: Family Medicine

## 2020-06-24 DIAGNOSIS — F339 Major depressive disorder, recurrent, unspecified: Secondary | ICD-10-CM | POA: Diagnosis present

## 2020-06-24 DIAGNOSIS — Z888 Allergy status to other drugs, medicaments and biological substances status: Secondary | ICD-10-CM | POA: Diagnosis not present

## 2020-06-24 DIAGNOSIS — C8248 Follicular lymphoma grade IIIb, lymph nodes of multiple sites: Secondary | ICD-10-CM

## 2020-06-24 DIAGNOSIS — R591 Generalized enlarged lymph nodes: Secondary | ICD-10-CM | POA: Diagnosis present

## 2020-06-24 DIAGNOSIS — R109 Unspecified abdominal pain: Secondary | ICD-10-CM | POA: Diagnosis present

## 2020-06-24 DIAGNOSIS — E883 Tumor lysis syndrome: Secondary | ICD-10-CM | POA: Diagnosis present

## 2020-06-24 DIAGNOSIS — K429 Umbilical hernia without obstruction or gangrene: Secondary | ICD-10-CM | POA: Diagnosis present

## 2020-06-24 DIAGNOSIS — J9811 Atelectasis: Secondary | ICD-10-CM | POA: Diagnosis present

## 2020-06-24 DIAGNOSIS — J940 Chylous effusion: Secondary | ICD-10-CM | POA: Diagnosis present

## 2020-06-24 DIAGNOSIS — R1084 Generalized abdominal pain: Secondary | ICD-10-CM

## 2020-06-24 DIAGNOSIS — J90 Pleural effusion, not elsewhere classified: Secondary | ICD-10-CM | POA: Diagnosis not present

## 2020-06-24 DIAGNOSIS — R0602 Shortness of breath: Secondary | ICD-10-CM | POA: Diagnosis present

## 2020-06-24 DIAGNOSIS — K219 Gastro-esophageal reflux disease without esophagitis: Secondary | ICD-10-CM | POA: Diagnosis present

## 2020-06-24 DIAGNOSIS — Z5111 Encounter for antineoplastic chemotherapy: Secondary | ICD-10-CM | POA: Diagnosis not present

## 2020-06-24 DIAGNOSIS — J9601 Acute respiratory failure with hypoxia: Secondary | ICD-10-CM | POA: Diagnosis present

## 2020-06-24 DIAGNOSIS — Z818 Family history of other mental and behavioral disorders: Secondary | ICD-10-CM | POA: Diagnosis not present

## 2020-06-24 DIAGNOSIS — Z79899 Other long term (current) drug therapy: Secondary | ICD-10-CM | POA: Diagnosis not present

## 2020-06-24 DIAGNOSIS — Z79890 Hormone replacement therapy (postmenopausal): Secondary | ICD-10-CM | POA: Diagnosis not present

## 2020-06-24 DIAGNOSIS — Z23 Encounter for immunization: Secondary | ICD-10-CM | POA: Diagnosis not present

## 2020-06-24 DIAGNOSIS — R188 Other ascites: Secondary | ICD-10-CM | POA: Diagnosis present

## 2020-06-24 DIAGNOSIS — N1330 Unspecified hydronephrosis: Secondary | ICD-10-CM | POA: Diagnosis present

## 2020-06-24 DIAGNOSIS — C8590 Non-Hodgkin lymphoma, unspecified, unspecified site: Secondary | ICD-10-CM

## 2020-06-24 DIAGNOSIS — F909 Attention-deficit hyperactivity disorder, unspecified type: Secondary | ICD-10-CM | POA: Diagnosis present

## 2020-06-24 DIAGNOSIS — I700 Atherosclerosis of aorta: Secondary | ICD-10-CM | POA: Diagnosis present

## 2020-06-24 DIAGNOSIS — F419 Anxiety disorder, unspecified: Secondary | ICD-10-CM | POA: Diagnosis present

## 2020-06-24 DIAGNOSIS — C786 Secondary malignant neoplasm of retroperitoneum and peritoneum: Secondary | ICD-10-CM | POA: Diagnosis present

## 2020-06-24 DIAGNOSIS — R590 Localized enlarged lymph nodes: Secondary | ICD-10-CM | POA: Diagnosis present

## 2020-06-24 DIAGNOSIS — R609 Edema, unspecified: Secondary | ICD-10-CM | POA: Diagnosis not present

## 2020-06-24 DIAGNOSIS — F329 Major depressive disorder, single episode, unspecified: Secondary | ICD-10-CM | POA: Diagnosis not present

## 2020-06-24 DIAGNOSIS — C8243 Follicular lymphoma grade IIIb, intra-abdominal lymph nodes: Principal | ICD-10-CM | POA: Diagnosis present

## 2020-06-24 DIAGNOSIS — Z20822 Contact with and (suspected) exposure to covid-19: Secondary | ICD-10-CM | POA: Diagnosis present

## 2020-06-24 DIAGNOSIS — H9190 Unspecified hearing loss, unspecified ear: Secondary | ICD-10-CM | POA: Diagnosis present

## 2020-06-24 DIAGNOSIS — Z7189 Other specified counseling: Secondary | ICD-10-CM

## 2020-06-24 DIAGNOSIS — G4700 Insomnia, unspecified: Secondary | ICD-10-CM | POA: Diagnosis present

## 2020-06-24 DIAGNOSIS — C8598 Non-Hodgkin lymphoma, unspecified, lymph nodes of multiple sites: Secondary | ICD-10-CM | POA: Diagnosis not present

## 2020-06-24 HISTORY — DX: Non-Hodgkin lymphoma, unspecified, unspecified site: C85.90

## 2020-06-24 LAB — CBC WITH DIFFERENTIAL/PLATELET
Abs Immature Granulocytes: 0.02 10*3/uL (ref 0.00–0.07)
Basophils Absolute: 0 10*3/uL (ref 0.0–0.1)
Basophils Relative: 1 %
Eosinophils Absolute: 0.2 10*3/uL (ref 0.0–0.5)
Eosinophils Relative: 3 %
HCT: 44.4 % (ref 39.0–52.0)
Hemoglobin: 14.8 g/dL (ref 13.0–17.0)
Immature Granulocytes: 0 %
Lymphocytes Relative: 5 %
Lymphs Abs: 0.3 10*3/uL — ABNORMAL LOW (ref 0.7–4.0)
MCH: 28.5 pg (ref 26.0–34.0)
MCHC: 33.3 g/dL (ref 30.0–36.0)
MCV: 85.5 fL (ref 80.0–100.0)
Monocytes Absolute: 0.5 10*3/uL (ref 0.1–1.0)
Monocytes Relative: 9 %
Neutro Abs: 4.4 10*3/uL (ref 1.7–7.7)
Neutrophils Relative %: 82 %
Platelets: 303 10*3/uL (ref 150–400)
RBC: 5.19 MIL/uL (ref 4.22–5.81)
RDW: 13.3 % (ref 11.5–15.5)
WBC: 5.4 10*3/uL (ref 4.0–10.5)
nRBC: 0 % (ref 0.0–0.2)

## 2020-06-24 LAB — APTT: aPTT: 32 seconds (ref 24–36)

## 2020-06-24 LAB — COMPREHENSIVE METABOLIC PANEL
ALT: 30 U/L (ref 0–44)
AST: 97 U/L — ABNORMAL HIGH (ref 15–41)
Albumin: 3.5 g/dL (ref 3.5–5.0)
Alkaline Phosphatase: 126 U/L (ref 38–126)
Anion gap: 13 (ref 5–15)
BUN: 15 mg/dL (ref 8–23)
CO2: 23 mmol/L (ref 22–32)
Calcium: 9.1 mg/dL (ref 8.9–10.3)
Chloride: 99 mmol/L (ref 98–111)
Creatinine, Ser: 1.15 mg/dL (ref 0.61–1.24)
GFR, Estimated: >60 mL/min (ref >60)
Glucose, Bld: 91 mg/dL (ref 70–99)
Potassium: 4.2 mmol/L (ref 3.5–5.1)
Sodium: 135 mmol/L (ref 135–145)
Total Bilirubin: 0.9 mg/dL (ref 0.3–1.2)
Total Protein: 6.3 g/dL — ABNORMAL LOW (ref 6.5–8.1)

## 2020-06-24 LAB — PROTIME-INR
INR: 1 (ref 0.8–1.2)
Prothrombin Time: 13.2 seconds (ref 11.4–15.2)

## 2020-06-24 LAB — URINALYSIS, ROUTINE W REFLEX MICROSCOPIC
Bilirubin Urine: NEGATIVE
Glucose, UA: NEGATIVE mg/dL
Hgb urine dipstick: NEGATIVE
Ketones, ur: 5 mg/dL — AB
Leukocytes,Ua: NEGATIVE
Nitrite: NEGATIVE
Protein, ur: NEGATIVE mg/dL
Specific Gravity, Urine: 1.039 — ABNORMAL HIGH (ref 1.005–1.030)
pH: 5 (ref 5.0–8.0)

## 2020-06-24 LAB — RESPIRATORY PANEL BY RT PCR (FLU A&B, COVID)
Influenza A by PCR: NEGATIVE
Influenza B by PCR: NEGATIVE
SARS Coronavirus 2 by RT PCR: NEGATIVE

## 2020-06-24 LAB — LACTIC ACID, PLASMA
Lactic Acid, Venous: 2.7 mmol/L (ref 0.5–1.9)
Lactic Acid, Venous: 2.3 mmol/L (ref 0.5–1.9)

## 2020-06-24 LAB — TROPONIN I (HIGH SENSITIVITY)
Troponin I (High Sensitivity): 5 ng/L (ref <18)
Troponin I (High Sensitivity): 4 ng/L (ref <18)

## 2020-06-24 MED ORDER — MORPHINE SULFATE (PF) 4 MG/ML IV SOLN
4.0000 mg | Freq: Once | INTRAVENOUS | Status: AC
  Administered 2020-06-24: 4 mg via INTRAVENOUS
  Filled 2020-06-24: qty 1

## 2020-06-24 MED ORDER — ALBUTEROL SULFATE (2.5 MG/3ML) 0.083% IN NEBU: 2.5000 mg | INHALATION_SOLUTION | Freq: Four times a day (QID) | RESPIRATORY_TRACT | Status: DC | PRN

## 2020-06-24 MED ORDER — SODIUM CHLORIDE (PF) 0.9 % IJ SOLN
INTRAMUSCULAR | Status: AC
  Filled 2020-06-24: qty 50

## 2020-06-24 MED ORDER — IOHEXOL 350 MG/ML SOLN
100.0000 mL | Freq: Once | INTRAVENOUS | Status: AC | PRN
  Administered 2020-06-24: 100 mL via INTRAVENOUS

## 2020-06-24 MED ORDER — LACTATED RINGERS IV BOLUS
500.0000 mL | Freq: Once | INTRAVENOUS | Status: AC
  Administered 2020-06-24: 500 mL via INTRAVENOUS

## 2020-06-24 MED ORDER — OXYCODONE-ACETAMINOPHEN 5-325 MG PO TABS
1.0000 | ORAL_TABLET | Freq: Four times a day (QID) | ORAL | Status: DC | PRN
  Administered 2020-06-26 – 2020-06-27 (×3): 2 via ORAL
  Administered 2020-06-28: 1 via ORAL
  Administered 2020-06-28 – 2020-06-29 (×4): 2 via ORAL
  Filled 2020-06-24: qty 1
  Filled 2020-06-24 (×2): qty 2
  Filled 2020-06-24: qty 1
  Filled 2020-06-24 (×3): qty 2
  Filled 2020-06-24: qty 1
  Filled 2020-06-24 (×2): qty 2

## 2020-06-24 MED ORDER — HYDROMORPHONE HCL 1 MG/ML IJ SOLN
0.5000 mg | INTRAMUSCULAR | Status: DC | PRN
  Administered 2020-06-25 – 2020-06-26 (×10): 1 mg via INTRAVENOUS
  Filled 2020-06-24 (×10): qty 1

## 2020-06-24 MED ORDER — ACETAMINOPHEN 650 MG RE SUPP: 650.0000 mg | Freq: Four times a day (QID) | RECTAL | Status: DC | PRN

## 2020-06-24 MED ORDER — ONDANSETRON HCL 4 MG/2ML IJ SOLN: 4.0000 mg | Freq: Four times a day (QID) | INTRAMUSCULAR | Status: AC | PRN

## 2020-06-24 MED ORDER — ENOXAPARIN SODIUM 40 MG/0.4ML ~~LOC~~ SOLN
40.0000 mg | SUBCUTANEOUS | Status: DC
  Administered 2020-06-24 – 2020-06-25 (×2): 40 mg via SUBCUTANEOUS
  Filled 2020-06-24 (×2): qty 0.4

## 2020-06-24 MED ORDER — SENNA 8.6 MG PO TABS
1.0000 | ORAL_TABLET | Freq: Two times a day (BID) | ORAL | Status: DC
  Administered 2020-06-24 – 2020-07-02 (×14): 8.6 mg via ORAL
  Filled 2020-06-24 (×15): qty 1

## 2020-06-24 MED ORDER — ACETAMINOPHEN 325 MG PO TABS: 650.0000 mg | ORAL_TABLET | Freq: Four times a day (QID) | ORAL | Status: DC | PRN

## 2020-06-24 MED ORDER — POLYETHYLENE GLYCOL 3350 17 G PO PACK: 17.0000 g | PACK | Freq: Every day | ORAL | Status: DC | PRN

## 2020-06-24 MED ORDER — BISACODYL 10 MG RE SUPP: 10.0000 mg | Freq: Every day | RECTAL | Status: DC | PRN

## 2020-06-24 MED ORDER — KCL IN DEXTROSE-NACL 10-5-0.45 MEQ/L-%-% IV SOLN
INTRAVENOUS | Status: DC
  Filled 2020-06-24 (×2): qty 1000

## 2020-06-24 NOTE — ED Notes (Signed)
Date and time results received: 06/24/20 3:03 PM  Test: Lactic Acid  Critical Value: 2.7  Name of Provider Notified: Tomi Bamberger, MD   Orders Received? Or Actions Taken?: Orders Received - See Orders for details

## 2020-06-24 NOTE — H&P (Signed)
[facility] History and Physical  THEODOROS STJAMES ITG:[id number] DOB: 1956-03-24 DOA: 06/24/2020  Referring physician: ED  PCP: [name], MD   Patient is coming from: home  Chief Complaint: Abdominal pain and shortness of breath  HPI: George Proctor is a 64 y.o. male with past medical history of anxiety, depression, GERD, presented hospital today with complaints of worsening abdominal distention and pain.  Patient stated  orthopnea and was unable to lie down because of shortness of breath.  Patient was recently seen by Dr. [name] oncology on 06/15/2020 for wide spread lymphadenopathy.  Patient underwent a PET CT scan on 06/21/2020 with findings of hypermetabolic regions throughout the body mostly in the abdomen with suspicion for peritoneal carcinomatosis as well.  Excisional biopsy has been planned for 07/27/2020.  In the interim, patient states that has been complaining of increasing abdominal pain and swelling and for the last couple days it has gotten worse.  Described as moderate to severe intensity pressure-like pain, intermittent in nature,worsened with lying down so he has been sleeping in a recliner.  He stated that he had a bowel movement this morning but was unable to eat much due to abdominal distention.  Denies any fever, chills or rigor no chest pain cough but has dyspnea.  He complains of subjective weight loss and night sweats.  No vomiting or diarrhea.  Denies any urinary urgency, frequency or dysuria.  Denies hematemesis, melena or change in bowel habits.  ED Course: In the ED, patient noted to be in distress with mild tachycardia and tachypnea.  With mild hypoxic at 89% on room air and was given 2 L of nasal cannula supplemental oxygen with improvement in his saturation.  Laboratory data was notable for normal CBC and chemistry except for low total protein and mildly elevated AST.  Coagulation profile was within normal limits.  Blood culture was also sent from the ED.  Chest  x-ray showed some blunting of the costophrenic angle.  CT angiogram of the chest did not show any evidence of pulmonary embolism.  He did have small to moderate bilateral pleural effusion with partial atelectasis of the lower lobes.  There was note of extensive retroperitoneal, mesenteric and omental adenopathy and enlarged lymph nodes in the anterior cardiophrenic space.  He did have hydronephrosis as well.  ED provider spoke with Dr. [name] primary oncologist who advised inpatient admission for further work-up and treatment.  Review of Systems:  All systems were reviewed and were negative unless otherwise mentioned in the HPI  Past Medical History:  Diagnosis Date  . Anxiety   . Depression   . Gait difficulty   . Lymphoma ([place])   . Memory loss    Past Surgical History:  Procedure Laterality Date  . ABDOMINAL SURGERY     Childhood  . BUNIONECTOMY      Social History:  reports that he has never smoked. He has never used smokeless tobacco. He reports that he does not drink alcohol and does not use drugs.  Allergies  Allergen Reactions  . Omeprazole Nausea And Vomiting    Family History  Problem Relation Age of Onset  . Aneurysm Mother 34       Thoracic  . Hypertension Father   . Heart failure Father   . Heart disease Father   . Heart attack Paternal Grandfather   . Depression Cousin   . Bipolar disorder Cousin   . Schizophrenia Maternal Uncle      Prior to Admission medications  Medication Sig Start Date End Date Taking? Authorizing Provider  Ascorbic Acid (VITAMIN C PO) Take 2,000 mg by mouth.     [provider]  B Complex Vitamins (VITAMIN B COMPLEX PO) Take by mouth.    [provider]  fish oil-omega-3 fatty acids 1000 MG capsule Take 6 g by mouth daily.     [provider]  lamoTRIgine (LAMICTAL) 25 MG tablet Take by mouth. Patient not taking: Reported on 06/15/2020 05/08/20   [provider]  lamoTRIgine Starter Kit-Orange 42 x 25  MG & 7 x 100 MG KIT SMARTSIG:Packet(s) By Mouth As Directed Patient not taking: Reported on 06/15/2020 04/06/20   [provider]  lurasidone (LATUDA) 20 MG TABS tablet Take 20 mg by mouth daily. Patient not taking: Reported on 06/15/2020    [provider]  Multiple Vitamin (MULTIVITAMIN) capsule Take 1 capsule by mouth daily.    [provider]  oxyCODONE (OXY IR/ROXICODONE) 5 MG immediate release tablet Take 1 tablet (5 mg total) by mouth every 4 (four) hours as needed for severe pain. 06/21/20   Brunetta Genera, MD  pantoprazole (PROTONIX) 40 MG tablet  01/10/19   [provider]  polyethylene glycol (MIRALAX) 17 g packet Take 17 g by mouth daily. 06/21/20   Brunetta Genera, MD  senna-docusate (SENNA S) 8.6-50 MG tablet Take 2 tablets by mouth at bedtime. 06/21/20   Brunetta Genera, MD  testosterone cypionate (DEPOTESTOSTERONE CYPIONATE) 200 MG/ML injection Inject into the muscle once a week.  12/25/19   [provider]    Physical Exam: Vitals:   06/24/20 1600 06/24/20 1615 06/24/20 1630 06/24/20 1643  BP: 127/81  128/90   Pulse: (!) 118 (!) 118 (!) 120 (!) 115  Resp: '16 17 14 15  ' Temp:      TempSrc:      SpO2: 91% 90% 91% 94%  Weight:      Height:       Wt Readings from Last 3 Encounters:  06/24/20 85.7 kg  06/15/20 84.5 kg  05/12/20 84.7 kg   Body mass index is 24.94 kg/m.  General: Alert awake propped up in bed, in mild distress and discomfort due to abdominal distention and pain. HENT: Normocephalic, pupils equally reacting to light and accommodation.  No scleral pallor or icterus noted. Oral mucosa is moist.  Chest: Diminished breath sounds bilateral.  Coarse breath sounds over the bases. CVS: S1 &S2 heard. No murmur.  Regular rate and rhythm. Abdomen: Soft, distended abdomen with mild diffuse tenderness bowel sounds are heard.  Medical hernia noted. Extremities: No cyanosis, clubbing but bilateral lower extremity  pitting edema noted peripheral pulses are palpable.  Left inguinal lymph node palpable. Psych: Alert, awake and oriented, mildly anxious CNS:  No cranial nerve deficits.  Power equal in all extremities.   No cerebellar signs.   Skin: Warm and dry.  No rashes noted.  Labs on Admission:   CBC: Recent Labs  Lab 06/24/20 1345  WBC 5.4  NEUTROABS 4.4  HGB 14.8  HCT 44.4  MCV 85.5  PLT 540    Basic Metabolic Panel: Recent Labs  Lab 06/24/20 1345  NA 135  K 4.2  CL 99  CO2 23  GLUCOSE 91  BUN 15  CREATININE 1.15  CALCIUM 9.1    Liver Function Tests: Recent Labs  Lab 06/24/20 1345  AST 97*  ALT 30  ALKPHOS 126  BILITOT 0.9  PROT 6.3*  ALBUMIN 3.5   No  results for input(s): LIPASE, AMYLASE in the last 168 hours. No results for input(s): AMMONIA in the last 168 hours.  Cardiac Enzymes: No results for input(s): CKTOTAL, CKMB, CKMBINDEX, TROPONINI in the last 168 hours.  BNP (last 3 results) No results for input(s): BNP in the last 8760 hours.  ProBNP (last 3 results) No results for input(s): PROBNP in the last 8760 hours.  CBG: Recent Labs  Lab 06/21/20 0700  GLUCAP 90     Radiological Exams on Admission: CT Angio Chest PE W and/or Wo Contrast  Result Date: 06/24/2020 CLINICAL DATA:  64 year old male with recent diagnosis of lymphoma presenting with shortness of breath. Concern for pulmonary embolism. EXAM: CT ANGIOGRAPHY CHEST CT ABDOMEN AND PELVIS WITH CONTRAST TECHNIQUE: Multidetector CT imaging of the chest was performed using the standard protocol during bolus administration of intravenous contrast. Multiplanar CT image reconstructions and MIPs were obtained to evaluate the vascular anatomy. Multidetector CT imaging of the abdomen and pelvis was performed using the standard protocol during bolus administration of intravenous contrast. CONTRAST:  125m OMNIPAQUE IOHEXOL 350 MG/ML SOLN COMPARISON:  Chest radiograph dated 06/24/2020. FINDINGS: Evaluation of  this exam is limited due to respiratory motion artifact. CTA CHEST FINDINGS Cardiovascular: There is no cardiomegaly or pericardial effusion. The thoracic aorta is unremarkable. Evaluation of the pulmonary arteries is limited due to respiratory motion artifact and suboptimal opacification and timing of the contrast. No large or central pulmonary artery embolus identified. Mediastinum/Nodes: There is no hilar adenopathy. Enlarged lymph nodes noted anterior to the heart and in the anterior cardiophrenic space. The esophagus and the thyroid gland are grossly unremarkable. No mediastinal fluid collection. Lungs/Pleura: Small to moderate bilateral pleural effusions with associated partial compressive atelectasis the lower lobes. Pneumonia is not excluded clinical correlation is recommended. There is no pneumothorax. The central airways are patent. Musculoskeletal: No chest wall abnormality. No acute or significant osseous findings. Review of the MIP images confirms the above findings. CT ABDOMEN and PELVIS FINDINGS No intra-abdominal free air. There is moderate ascites. Hepatobiliary: The liver is unremarkable. No intrahepatic biliary ductal dilatation. The gallbladder is unremarkable. Pancreas: The pancreas is suboptimally visualized but grossly unremarkable. Spleen: Normal in size without focal abnormality. Adrenals/Urinary Tract: The adrenal glands unremarkable. There is mild left hydronephrosis. There is delayed excretion of contrast by left kidney. The right kidney is unremarkable. The urinary bladder is grossly unremarkable. Stomach/Bowel: There is moderate stool throughout the colon. There is no bowel obstruction. The appendix is not visualized with certainty. No inflammatory changes identified in the right lower quadrant. Vascular/Lymphatic: Mild aortoiliac atherosclerotic disease. The IVC is grossly unremarkable. No portal venous gas. Extensive retroperitoneal adenopathy encasing the abdominal aorta and iliac  arteries. There is diffuse mesenteric and omental implants and caking. Large soft tissue mass in the mesentery encasing the SMA measures approximately 11 x 17 cm in axial dimensions and 15 cm in craniocaudal length. Reproductive: The prostate is grossly unremarkable. Other: Diffuse subcutaneous edema. Small fat containing umbilical hernia. Musculoskeletal: No acute or significant osseous findings. Review of the MIP images confirms the above findings. IMPRESSION: 1. No CT evidence of central pulmonary artery embolus. 2. Small to moderate bilateral pleural effusions with associated partial compressive atelectasis of the lower lobes. Pneumonia is not excluded clinical correlation is recommended. 3. Extensive retroperitoneal, mesenteric, and omental adenopathy in keeping with history of lymphoma. Enlarged lymph nodes in the anterior cardiophrenic space. 4. Moderate ascites. 5. Mild left hydronephrosis. 6. Aortic Atherosclerosis (ICD10-I70.0). Electronically Signed   By: AMilas Hock  Radparvar M.D.   On: 06/24/2020 16:02   CT ABDOMEN PELVIS W CONTRAST  Result Date: 06/24/2020 CLINICAL DATA:  64 year old male with recent diagnosis of lymphoma presenting with shortness of breath. Concern for pulmonary embolism. EXAM: CT ANGIOGRAPHY CHEST CT ABDOMEN AND PELVIS WITH CONTRAST TECHNIQUE: Multidetector CT imaging of the chest was performed using the standard protocol during bolus administration of intravenous contrast. Multiplanar CT image reconstructions and MIPs were obtained to evaluate the vascular anatomy. Multidetector CT imaging of the abdomen and pelvis was performed using the standard protocol during bolus administration of intravenous contrast. CONTRAST:  172m OMNIPAQUE IOHEXOL 350 MG/ML SOLN COMPARISON:  Chest radiograph dated 06/24/2020. FINDINGS: Evaluation of this exam is limited due to respiratory motion artifact. CTA CHEST FINDINGS Cardiovascular: There is no cardiomegaly or pericardial effusion. The thoracic  aorta is unremarkable. Evaluation of the pulmonary arteries is limited due to respiratory motion artifact and suboptimal opacification and timing of the contrast. No large or central pulmonary artery embolus identified. Mediastinum/Nodes: There is no hilar adenopathy. Enlarged lymph nodes noted anterior to the heart and in the anterior cardiophrenic space. The esophagus and the thyroid gland are grossly unremarkable. No mediastinal fluid collection. Lungs/Pleura: Small to moderate bilateral pleural effusions with associated partial compressive atelectasis the lower lobes. Pneumonia is not excluded clinical correlation is recommended. There is no pneumothorax. The central airways are patent. Musculoskeletal: No chest wall abnormality. No acute or significant osseous findings. Review of the MIP images confirms the above findings. CT ABDOMEN and PELVIS FINDINGS No intra-abdominal free air. There is moderate ascites. Hepatobiliary: The liver is unremarkable. No intrahepatic biliary ductal dilatation. The gallbladder is unremarkable. Pancreas: The pancreas is suboptimally visualized but grossly unremarkable. Spleen: Normal in size without focal abnormality. Adrenals/Urinary Tract: The adrenal glands unremarkable. There is mild left hydronephrosis. There is delayed excretion of contrast by left kidney. The right kidney is unremarkable. The urinary bladder is grossly unremarkable. Stomach/Bowel: There is moderate stool throughout the colon. There is no bowel obstruction. The appendix is not visualized with certainty. No inflammatory changes identified in the right lower quadrant. Vascular/Lymphatic: Mild aortoiliac atherosclerotic disease. The IVC is grossly unremarkable. No portal venous gas. Extensive retroperitoneal adenopathy encasing the abdominal aorta and iliac arteries. There is diffuse mesenteric and omental implants and caking. Large soft tissue mass in the mesentery encasing the SMA measures approximately 11 x  17 cm in axial dimensions and 15 cm in craniocaudal length. Reproductive: The prostate is grossly unremarkable. Other: Diffuse subcutaneous edema. Small fat containing umbilical hernia. Musculoskeletal: No acute or significant osseous findings. Review of the MIP images confirms the above findings. IMPRESSION: 1. No CT evidence of central pulmonary artery embolus. 2. Small to moderate bilateral pleural effusions with associated partial compressive atelectasis of the lower lobes. Pneumonia is not excluded clinical correlation is recommended. 3. Extensive retroperitoneal, mesenteric, and omental adenopathy in keeping with history of lymphoma. Enlarged lymph nodes in the anterior cardiophrenic space. 4. Moderate ascites. 5. Mild left hydronephrosis. 6. Aortic Atherosclerosis (ICD10-I70.0). Electronically Signed   By: AAnner CreteM.D.   On: 06/24/2020 16:02   DG Chest Port 1 View  Result Date: 06/24/2020 CLINICAL DATA:  Abdominal pain and swelling.  History of lymphoma. EXAM: PORTABLE CHEST 1 VIEW COMPARISON:  Chest radiograph 11/17/2017 FINDINGS: Normal cardiac and mediastinal contours. Small bilateral pleural effusions with underlying consolidation. No pleural effusion or pneumothorax. Osseous structures unremarkable. IMPRESSION: Small bilateral pleural effusions with underlying consolidation. Electronically Signed   By: DPolly CobiaD.  On: 06/24/2020 14:11    EKG: Personally reviewed by me which shows sinus tachycardia  Assessment/Plan Principal Problem:   Abdominal pain Active Problems:   Anxiety   Attention deficit hyperactivity disorder (ADHD)   GERD   SOB (shortness of breath)   Major depressive disorder  Abdominal pain, distention and discomfort.  Focus on analgesia.  Continue bowel regimen.  Patient does have extensive intra-abdominal lymphadenopathy and possible peritoneal carcinomatosis as per PET scan.  Plan is biopsy on Monday.  Oncology has been notified from the ED.  Follow  recommendations.  We will add IV Dilaudid for adequate pain relief.  Put the patient on clear liquids for now.  Check urine analysis.  Generalized lymphadenopathy, abdominal distention basal pleural effusion.  Patient does have some pleuritic component of pain.  Could be spectrum of lymphoma.  LDH done on 06/15/2020 was more than 1000.  Hepatitis B and C was negative.  HIV was negative as well.  Patient was seen by oncology as outpatient recently.  Plan is excisional biopsy of the lymph node on 06/26/2020.  At this time we will continue supportive care for the patient.  Shortness of breath dyspnea and acute hypoxic respiratory failure on presentation.  Continue incentive spirometry, supplemental oxygen.  2 L of oxygen by nasal cannula at this time.  Chest x-ray reviewed by me which shows mild to moderate pleural effusion.  Will consider pleural tapping if worsening symptoms, worsening findings on imaging or hypoxia.  We will get BNP, 2D echocardiogram.  History of depression anxiety.  Used to be on on oral medication from home.  States that  they have not been helping.  History of GERD we will put the patient on Protonix.  DVT Prophylaxis: Lovenox subcu  Consultant: Dr. [name], oncology was contacted from the ED.  Code Status: Full code  Microbiology blood cultures have been sent from the ED  Antibiotics: None  Family Communication:  Patients' condition and plan of care including tests being ordered have been discussed with the patient  who indicate understanding and agree with the plan.   Status is: Inpatient  Remains inpatient appropriate because:IV treatments appropriate due to intensity of illness or inability to take PO, Inpatient level of care appropriate due to severity of illness and Plan for biopsy, IV narcotics for pain relief, oncology evaluation and treatment plan   Dispo: The patient is from: Home              Anticipated d/c is to: Home              Anticipated d/c date is: 2  to 3 days              Patient currently is not medically stable to d/c.   Severity of Illness: The appropriate patient status for this patient is INPATIENT. Inpatient status is judged to be reasonable and necessary in order to provide the required intensity of service to ensure the patient's safety. The patient's presenting symptoms, physical exam findings, and initial radiographic and laboratory data in the context of their chronic comorbidities is felt to place them at high risk for further clinical deterioration. Furthermore, it is not anticipated that the patient will be medically stable for discharge from the hospital within 2 midnights of admission. I certify that at the point of admission it is my clinical judgment that the patient will require inpatient hospital care spanning beyond 2 midnights from the point of admission due to high intensity of service, high risk  for further deterioration and high frequency of surveillance required.   Signed, Flora Lipps, MD [facility] 06/24/2020

## 2020-06-24 NOTE — ED Notes (Signed)
Provider made aware of pt's increased heart rate

## 2020-06-24 NOTE — ED Provider Notes (Signed)
[facility] DEPT Provider Note   CSN: [id number] Arrival date & time: 06/24/20  1219     History Chief Complaint  Patient presents with  . Abdominal Pain    DAEJON LICH is a 64 y.o. male history of newly diagnosed lymphoma not currently on any treatments has plans for biopsy this Monday.  Patient reports he has had increasing abdominal pain and swelling over the last few weeks, over the last 2 days he feels it has acutely worsened.  He describes moderate-severe intensity pressure aching throughout his entire abdomen nonradiating worsened with lying flat and palpation no alleviating factors.  He feels the swelling has increased and he has now developed shortness of breath which has been constant over the last few days but is worsened with lying flat.  He reports unable to eat due to abdominal swelling, last bowel movement this morning normal without blood.  Denies fever/chills, chest pain, cough, vomiting, diarrhea, dysuria/hematuria or any additional concerns.  HPI     Past Medical History:  Diagnosis Date  . Anxiety   . Depression   . Gait difficulty   . Lymphoma ([place])   . Memory loss     Patient Active Problem List   Diagnosis Date Noted  . Abdominal pain 06/24/2020  . Laryngopharyngeal reflux (LPR) 03/24/2018  . Throat clearing 03/24/2018  . Aortic valve regurgitation 03/04/2017  . Depression 01/17/2017  . Major depressive disorder 04/21/2014  . Plantar fasciitis 12/24/2013  . Achilles tendonitis 12/14/2013  . Pronation deformity of ankle, acquired 12/14/2013  . Pain in lower limb 12/14/2013  . Recurrent major depression resistant to treatment ([place]) 01/16/2012  . SOB (shortness of breath) 10/30/2011  . Aortic root dilatation ([place]) 10/30/2011  . Abnormal stress test 10/22/2011  . Chest pain 10/22/2011  . TESTOSTERONE DEFICIENCY 04/19/2008  . Anxiety 04/28/2007  . INSOMNIA, PERSISTENT 04/28/2007  . Attention deficit hyperactivity  disorder (ADHD) 04/28/2007  . GERD 04/28/2007    Past Surgical History:  Procedure Laterality Date  . ABDOMINAL SURGERY     Childhood  . BUNIONECTOMY         Family History  Problem Relation Age of Onset  . Aneurysm Mother 57       Thoracic  . Hypertension Father   . Heart failure Father   . Heart disease Father   . Heart attack Paternal Grandfather   . Depression Cousin   . Bipolar disorder Cousin   . Schizophrenia Maternal Uncle     Social History   Tobacco Use  . Smoking status: Never Smoker  . Smokeless tobacco: Never Used  Vaping Use  . Vaping Use: Never used  Substance Use Topics  . Alcohol use: No    Alcohol/week: 0.0 standard drinks    Comment: Less than one drink per week.  . Drug use: No    Home Medications Prior to Admission medications   Medication Sig Start Date End Date Taking? Authorizing Provider  Ascorbic Acid (VITAMIN C PO) Take 2,000 mg by mouth.     [provider]  B Complex Vitamins (VITAMIN B COMPLEX PO) Take by mouth.    [provider]  fish oil-omega-3 fatty acids 1000 MG capsule Take 6 g by mouth daily.     [provider]  lamoTRIgine (LAMICTAL) 25 MG tablet Take by mouth. Patient not taking: Reported on 06/15/2020 05/08/20   [provider]  lamoTRIgine Starter Kit-Orange 42 x 25 MG & 7 x 100 MG KIT SMARTSIG:Packet(s) By  Mouth As Directed Patient not taking: Reported on 06/15/2020 04/06/20   [provider]  lurasidone (LATUDA) 20 MG TABS tablet Take 20 mg by mouth daily. Patient not taking: Reported on 06/15/2020    [provider]  Multiple Vitamin (MULTIVITAMIN) capsule Take 1 capsule by mouth daily.    [provider]  oxyCODONE (OXY IR/ROXICODONE) 5 MG immediate release tablet Take 1 tablet (5 mg total) by mouth every 4 (four) hours as needed for severe pain. 06/21/20   Brunetta Genera, MD  pantoprazole (PROTONIX) 40 MG tablet  01/10/19   [provider]    polyethylene glycol (MIRALAX) 17 g packet Take 17 g by mouth daily. 06/21/20   Brunetta Genera, MD  senna-docusate (SENNA S) 8.6-50 MG tablet Take 2 tablets by mouth at bedtime. 06/21/20   Brunetta Genera, MD  testosterone cypionate (DEPOTESTOSTERONE CYPIONATE) 200 MG/ML injection Inject into the muscle once a week.  12/25/19   [provider]    Allergies    Omeprazole  Review of Systems   Review of Systems Ten systems are reviewed and are negative for acute change except as noted in the HPI  Physical Exam Updated Vital Signs BP 115/85   Pulse (!) 120   Temp 97.8 F (36.6 C)   Resp 18   Ht 6' 1" (1.854 m)   Wt 85.7 kg   SpO2 90%   BMI 24.94 kg/m   Physical Exam Constitutional:      General: He is not in acute distress.    Appearance: Normal appearance. He is well-developed. He is not ill-appearing or diaphoretic.  HENT:     Head: Normocephalic and atraumatic.  Eyes:     General: Vision grossly intact. Gaze aligned appropriately.     Pupils: Pupils are equal, round, and reactive to light.  Neck:     Trachea: Trachea and phonation normal.  Pulmonary:     Effort: Pulmonary effort is normal. No respiratory distress.     Breath sounds: Normal breath sounds and air entry.  Abdominal:     General: Abdomen is protuberant. There is distension.     Tenderness: There is generalized abdominal tenderness. There is no guarding or rebound.  Musculoskeletal:        General: Normal range of motion.     Cervical back: Normal range of motion.  Skin:    General: Skin is warm and dry.  Neurological:     Mental Status: He is alert.     GCS: GCS eye subscore is 4. GCS verbal subscore is 5. GCS motor subscore is 6.     Comments: Speech is clear and goal oriented, follows commands Major Cranial nerves without deficit, no facial droop Moves extremities without ataxia, coordination intact  Psychiatric:        Behavior: Behavior normal.     ED Results / Procedures /  Treatments   Labs (all labs ordered are listed, but only abnormal results are displayed) Labs Reviewed  LACTIC ACID, PLASMA - Abnormal; Notable for the following components:      Result Value   Lactic Acid, Venous 2.7 (*)    All other components within normal limits  LACTIC ACID, PLASMA - Abnormal; Notable for the following components:   Lactic Acid, Venous 2.3 (*)    All other components within normal limits  COMPREHENSIVE METABOLIC PANEL - Abnormal; Notable for the following components:   Total Protein 6.3 (*)    AST 97 (*)    All  other components within normal limits  CBC WITH DIFFERENTIAL/PLATELET - Abnormal; Notable for the following components:   Lymphs Abs 0.3 (*)    All other components within normal limits  RESPIRATORY PANEL BY RT PCR (FLU A&B, COVID)  CULTURE, BLOOD (SINGLE)  URINE CULTURE  PROTIME-INR  APTT  URINALYSIS, ROUTINE W REFLEX MICROSCOPIC  COMPREHENSIVE METABOLIC PANEL  CBC  PROTIME-INR  TROPONIN I (HIGH SENSITIVITY)  TROPONIN I (HIGH SENSITIVITY)    EKG None  Radiology CT Angio Chest PE W and/or Wo Contrast  Result Date: 06/24/2020 CLINICAL DATA:  64 year old male with recent diagnosis of lymphoma presenting with shortness of breath. Concern for pulmonary embolism. EXAM: CT ANGIOGRAPHY CHEST CT ABDOMEN AND PELVIS WITH CONTRAST TECHNIQUE: Multidetector CT imaging of the chest was performed using the standard protocol during bolus administration of intravenous contrast. Multiplanar CT image reconstructions and MIPs were obtained to evaluate the vascular anatomy. Multidetector CT imaging of the abdomen and pelvis was performed using the standard protocol during bolus administration of intravenous contrast. CONTRAST:  153m OMNIPAQUE IOHEXOL 350 MG/ML SOLN COMPARISON:  Chest radiograph dated 06/24/2020. FINDINGS: Evaluation of this exam is limited due to respiratory motion artifact. CTA CHEST FINDINGS Cardiovascular: There is no cardiomegaly or pericardial  effusion. The thoracic aorta is unremarkable. Evaluation of the pulmonary arteries is limited due to respiratory motion artifact and suboptimal opacification and timing of the contrast. No large or central pulmonary artery embolus identified. Mediastinum/Nodes: There is no hilar adenopathy. Enlarged lymph nodes noted anterior to the heart and in the anterior cardiophrenic space. The esophagus and the thyroid gland are grossly unremarkable. No mediastinal fluid collection. Lungs/Pleura: Small to moderate bilateral pleural effusions with associated partial compressive atelectasis the lower lobes. Pneumonia is not excluded clinical correlation is recommended. There is no pneumothorax. The central airways are patent. Musculoskeletal: No chest wall abnormality. No acute or significant osseous findings. Review of the MIP images confirms the above findings. CT ABDOMEN and PELVIS FINDINGS No intra-abdominal free air. There is moderate ascites. Hepatobiliary: The liver is unremarkable. No intrahepatic biliary ductal dilatation. The gallbladder is unremarkable. Pancreas: The pancreas is suboptimally visualized but grossly unremarkable. Spleen: Normal in size without focal abnormality. Adrenals/Urinary Tract: The adrenal glands unremarkable. There is mild left hydronephrosis. There is delayed excretion of contrast by left kidney. The right kidney is unremarkable. The urinary bladder is grossly unremarkable. Stomach/Bowel: There is moderate stool throughout the colon. There is no bowel obstruction. The appendix is not visualized with certainty. No inflammatory changes identified in the right lower quadrant. Vascular/Lymphatic: Mild aortoiliac atherosclerotic disease. The IVC is grossly unremarkable. No portal venous gas. Extensive retroperitoneal adenopathy encasing the abdominal aorta and iliac arteries. There is diffuse mesenteric and omental implants and caking. Large soft tissue mass in the mesentery encasing the SMA  measures approximately 11 x 17 cm in axial dimensions and 15 cm in craniocaudal length. Reproductive: The prostate is grossly unremarkable. Other: Diffuse subcutaneous edema. Small fat containing umbilical hernia. Musculoskeletal: No acute or significant osseous findings. Review of the MIP images confirms the above findings. IMPRESSION: 1. No CT evidence of central pulmonary artery embolus. 2. Small to moderate bilateral pleural effusions with associated partial compressive atelectasis of the lower lobes. Pneumonia is not excluded clinical correlation is recommended. 3. Extensive retroperitoneal, mesenteric, and omental adenopathy in keeping with history of lymphoma. Enlarged lymph nodes in the anterior cardiophrenic space. 4. Moderate ascites. 5. Mild left hydronephrosis. 6. Aortic Atherosclerosis (ICD10-I70.0). Electronically Signed   By: ALaren EvertsD.  On: 06/24/2020 16:02   CT ABDOMEN PELVIS W CONTRAST  Result Date: 06/24/2020 CLINICAL DATA:  64 year old male with recent diagnosis of lymphoma presenting with shortness of breath. Concern for pulmonary embolism. EXAM: CT ANGIOGRAPHY CHEST CT ABDOMEN AND PELVIS WITH CONTRAST TECHNIQUE: Multidetector CT imaging of the chest was performed using the standard protocol during bolus administration of intravenous contrast. Multiplanar CT image reconstructions and MIPs were obtained to evaluate the vascular anatomy. Multidetector CT imaging of the abdomen and pelvis was performed using the standard protocol during bolus administration of intravenous contrast. CONTRAST:  186m OMNIPAQUE IOHEXOL 350 MG/ML SOLN COMPARISON:  Chest radiograph dated 06/24/2020. FINDINGS: Evaluation of this exam is limited due to respiratory motion artifact. CTA CHEST FINDINGS Cardiovascular: There is no cardiomegaly or pericardial effusion. The thoracic aorta is unremarkable. Evaluation of the pulmonary arteries is limited due to respiratory motion artifact and suboptimal  opacification and timing of the contrast. No large or central pulmonary artery embolus identified. Mediastinum/Nodes: There is no hilar adenopathy. Enlarged lymph nodes noted anterior to the heart and in the anterior cardiophrenic space. The esophagus and the thyroid gland are grossly unremarkable. No mediastinal fluid collection. Lungs/Pleura: Small to moderate bilateral pleural effusions with associated partial compressive atelectasis the lower lobes. Pneumonia is not excluded clinical correlation is recommended. There is no pneumothorax. The central airways are patent. Musculoskeletal: No chest wall abnormality. No acute or significant osseous findings. Review of the MIP images confirms the above findings. CT ABDOMEN and PELVIS FINDINGS No intra-abdominal free air. There is moderate ascites. Hepatobiliary: The liver is unremarkable. No intrahepatic biliary ductal dilatation. The gallbladder is unremarkable. Pancreas: The pancreas is suboptimally visualized but grossly unremarkable. Spleen: Normal in size without focal abnormality. Adrenals/Urinary Tract: The adrenal glands unremarkable. There is mild left hydronephrosis. There is delayed excretion of contrast by left kidney. The right kidney is unremarkable. The urinary bladder is grossly unremarkable. Stomach/Bowel: There is moderate stool throughout the colon. There is no bowel obstruction. The appendix is not visualized with certainty. No inflammatory changes identified in the right lower quadrant. Vascular/Lymphatic: Mild aortoiliac atherosclerotic disease. The IVC is grossly unremarkable. No portal venous gas. Extensive retroperitoneal adenopathy encasing the abdominal aorta and iliac arteries. There is diffuse mesenteric and omental implants and caking. Large soft tissue mass in the mesentery encasing the SMA measures approximately 11 x 17 cm in axial dimensions and 15 cm in craniocaudal length. Reproductive: The prostate is grossly unremarkable. Other:  Diffuse subcutaneous edema. Small fat containing umbilical hernia. Musculoskeletal: No acute or significant osseous findings. Review of the MIP images confirms the above findings. IMPRESSION: 1. No CT evidence of central pulmonary artery embolus. 2. Small to moderate bilateral pleural effusions with associated partial compressive atelectasis of the lower lobes. Pneumonia is not excluded clinical correlation is recommended. 3. Extensive retroperitoneal, mesenteric, and omental adenopathy in keeping with history of lymphoma. Enlarged lymph nodes in the anterior cardiophrenic space. 4. Moderate ascites. 5. Mild left hydronephrosis. 6. Aortic Atherosclerosis (ICD10-I70.0). Electronically Signed   By: AAnner CreteM.D.   On: 06/24/2020 16:02   DG Chest Port 1 View  Result Date: 06/24/2020 CLINICAL DATA:  Abdominal pain and swelling.  History of lymphoma. EXAM: PORTABLE CHEST 1 VIEW COMPARISON:  Chest radiograph 11/17/2017 FINDINGS: Normal cardiac and mediastinal contours. Small bilateral pleural effusions with underlying consolidation. No pleural effusion or pneumothorax. Osseous structures unremarkable. IMPRESSION: Small bilateral pleural effusions with underlying consolidation. Electronically Signed   By: DLovey NewcomerM.D.   On: 06/24/2020 14:11  Procedures .Critical Care Performed by: Deliah Boston, PA-C Authorized by: Deliah Boston, PA-C   Critical care provider statement:    Critical care time (minutes):  31   Critical care was necessary to treat or prevent imminent or life-threatening deterioration of the following conditions:  Respiratory failure   Critical care was time spent personally by me on the following activities:  Discussions with consultants, evaluation of patient's response to treatment, examination of patient, ordering and performing treatments and interventions, ordering and review of laboratory studies, ordering and review of radiographic studies, pulse oximetry,  re-evaluation of patient's condition, obtaining history from patient or surrogate, review of old charts and development of treatment plan with patient or surrogate   (including critical care time)  Medications Ordered in ED Medications  sodium chloride (PF) 0.9 % injection (has no administration in time range)  enoxaparin (LOVENOX) injection 40 mg (has no administration in time range)  dextrose 5 % and 0.45 % NaCl with KCl 10 mEq/L infusion (has no administration in time range)  acetaminophen (TYLENOL) tablet 650 mg (has no administration in time range)    Or  acetaminophen (TYLENOL) suppository 650 mg (has no administration in time range)  HYDROmorphone (DILAUDID) injection 0.5-1 mg (has no administration in time range)  senna (SENOKOT) tablet 8.6 mg (has no administration in time range)  bisacodyl (DULCOLAX) suppository 10 mg (has no administration in time range)  polyethylene glycol (MIRALAX / GLYCOLAX) packet 17 g (has no administration in time range)  albuterol (PROVENTIL) (2.5 MG/3ML) 0.083% nebulizer solution 2.5 mg (has no administration in time range)  morphine 4 MG/ML injection 4 mg (4 mg Intravenous Given 06/24/20 1346)  lactated ringers bolus 500 mL (0 mLs Intravenous Stopped 06/24/20 1703)  iohexol (OMNIPAQUE) 350 MG/ML injection 100 mL (100 mLs Intravenous Contrast Given 06/24/20 1510)  morphine 4 MG/ML injection 4 mg (4 mg Intravenous Given 06/24/20 1547)    ED Course  I have reviewed the triage vital signs and the nursing notes.  Pertinent labs & imaging results that were available during my care of the patient were reviewed by me and considered in my medical decision making (see chart for details).  Clinical Course as of Jun 24 1732  Sat Jun 24, 2020  1639 Dr. [name]   [BM]  1652 Hospitalist   [BM]    Clinical Course User Index [BM] Gari Crown   MDM Rules/Calculators/A&P                         Additional history obtained from: 1. Nursing notes from  this visit. 2. Electronic medical record review.  Patient had PET scan 3 days ago, showed hypermetabolic adenopathy throughout the neck chest abdomen and pelvis with extensive peritoneal carcinomatosis.  Consistent with lymphoma.  Also with moderate bilateral pleural effusions.  Left pelvicaliceal fullness versus hydronephrosis.  Questionable punctate left renal stone. ------------------------------------------ I ordered, reviewed and interpreted labs which include: CBC without leukocytosis or anemia. aPTT and PT/INR within normal limits Covid/flu panel negative CMP without emergent electrolyte derangement, AKI, emergent LFT elevations or gap Lactic 2.3 High-sensitivity troponin within normal limits  CXR:    IMPRESSION:  Small bilateral pleural effusions with underlying consolidation.   CT Angio Chest and AP: IMPRESSION:  1. No CT evidence of central pulmonary artery embolus.  2. Small to moderate bilateral pleural effusions with associated  partial compressive atelectasis of the lower lobes. Pneumonia is not  excluded clinical correlation is recommended.  3. Extensive retroperitoneal, mesenteric, and omental adenopathy in  keeping with history of lymphoma. Enlarged lymph nodes in the  anterior cardiophrenic space.  4. Moderate ascites.  5. Mild left hydronephrosis.  6. Aortic Atherosclerosis (ICD10-I70.0).  - Patient reassessed multiple times at this visit resting comfortably no acute distress he is requiring 2 L nasal cannula for hypoxia.  Suspect this is secondary to pleural effusions, lower suspicion for bacterial infection/pneumonia at this time as he has no leukocytosis fever or productive cough.  Patient is agreeable for admission.  I spoke with patient's oncologist Dr. [name] who agrees with hospitalist admission. - Discussed case with Dr. Louanne Belton, patient accepted to hospitalist service.  Note: Portions of this report may have been transcribed using voice recognition software.  Every effort was made to ensure accuracy; however, inadvertent computerized transcription errors may still be present. Final Clinical Impression(s) / ED Diagnoses Final diagnoses:  Generalized abdominal pain  Bilateral pleural effusion  Lymphadenopathy  Other ascites    Rx / DC Orders ED Discharge Orders    None       Gari Crown 06/24/20 1734    Dorie Rank, MD 06/24/20 2234

## 2020-06-24 NOTE — ED Triage Notes (Signed)
Abd swelling and pain due to lymphoma, increased weakness with abd distention. Poor appetite, pain increasing

## 2020-06-25 ENCOUNTER — Inpatient Hospital Stay (HOSPITAL_COMMUNITY): Payer: 59

## 2020-06-25 DIAGNOSIS — R0602 Shortness of breath: Secondary | ICD-10-CM | POA: Diagnosis not present

## 2020-06-25 DIAGNOSIS — F909 Attention-deficit hyperactivity disorder, unspecified type: Secondary | ICD-10-CM

## 2020-06-25 DIAGNOSIS — R109 Unspecified abdominal pain: Secondary | ICD-10-CM | POA: Diagnosis not present

## 2020-06-25 DIAGNOSIS — K219 Gastro-esophageal reflux disease without esophagitis: Secondary | ICD-10-CM | POA: Diagnosis not present

## 2020-06-25 DIAGNOSIS — F419 Anxiety disorder, unspecified: Secondary | ICD-10-CM | POA: Diagnosis not present

## 2020-06-25 LAB — CBC
HCT: 42.5 % (ref 39.0–52.0)
Hemoglobin: 14 g/dL (ref 13.0–17.0)
MCH: 28.5 pg (ref 26.0–34.0)
MCHC: 32.9 g/dL (ref 30.0–36.0)
MCV: 86.6 fL (ref 80.0–100.0)
Platelets: 278 10*3/uL (ref 150–400)
RBC: 4.91 MIL/uL (ref 4.22–5.81)
RDW: 13.3 % (ref 11.5–15.5)
WBC: 4.5 10*3/uL (ref 4.0–10.5)
nRBC: 0 % (ref 0.0–0.2)

## 2020-06-25 LAB — COMPREHENSIVE METABOLIC PANEL
ALT: 29 U/L (ref 0–44)
AST: 88 U/L — ABNORMAL HIGH (ref 15–41)
Albumin: 3.3 g/dL — ABNORMAL LOW (ref 3.5–5.0)
Alkaline Phosphatase: 113 U/L (ref 38–126)
Anion gap: 11 (ref 5–15)
BUN: 13 mg/dL (ref 8–23)
CO2: 24 mmol/L (ref 22–32)
Calcium: 8.7 mg/dL — ABNORMAL LOW (ref 8.9–10.3)
Chloride: 99 mmol/L (ref 98–111)
Creatinine, Ser: 1.05 mg/dL (ref 0.61–1.24)
GFR, Estimated: >60 mL/min (ref >60)
Glucose, Bld: 91 mg/dL (ref 70–99)
Potassium: 4.5 mmol/L (ref 3.5–5.1)
Sodium: 134 mmol/L — ABNORMAL LOW (ref 135–145)
Total Bilirubin: 0.8 mg/dL (ref 0.3–1.2)
Total Protein: 5.8 g/dL — ABNORMAL LOW (ref 6.5–8.1)

## 2020-06-25 LAB — PHOSPHORUS: Phosphorus: 4.2 mg/dL (ref 2.5–4.6)

## 2020-06-25 LAB — LACTIC ACID, PLASMA: Lactic Acid, Venous: 2.2 mmol/L (ref 0.5–1.9)

## 2020-06-25 LAB — PROTIME-INR
INR: 1.1 (ref 0.8–1.2)
Prothrombin Time: 13.4 seconds (ref 11.4–15.2)

## 2020-06-25 LAB — MAGNESIUM: Magnesium: 2 mg/dL (ref 1.7–2.4)

## 2020-06-25 LAB — ECHOCARDIOGRAM COMPLETE
Area-P 1/2: 3.31 cm2
Height: 73 in
Weight: 3024 oz

## 2020-06-25 LAB — BRAIN NATRIURETIC PEPTIDE: B Natriuretic Peptide: 29.7 pg/mL (ref 0.0–100.0)

## 2020-06-25 MED ORDER — ALPRAZOLAM 0.25 MG PO TABS: 0.2500 mg | ORAL_TABLET | Freq: Three times a day (TID) | ORAL | Status: DC | PRN

## 2020-06-25 MED ORDER — ALUM & MAG HYDROXIDE-SIMETH 200-200-20 MG/5ML PO SUSP
15.0000 mL | Freq: Four times a day (QID) | ORAL | Status: DC | PRN
  Administered 2020-06-25 – 2020-06-29 (×2): 15 mL via ORAL
  Filled 2020-06-25 (×2): qty 30

## 2020-06-25 MED ORDER — FUROSEMIDE 10 MG/ML IJ SOLN
40.0000 mg | Freq: Once | INTRAMUSCULAR | Status: AC
  Administered 2020-06-25: 40 mg via INTRAVENOUS
  Filled 2020-06-25: qty 4

## 2020-06-25 MED ORDER — PANTOPRAZOLE SODIUM 40 MG PO TBEC
40.0000 mg | DELAYED_RELEASE_TABLET | Freq: Every day | ORAL | Status: DC
  Administered 2020-06-25 – 2020-07-02 (×8): 40 mg via ORAL
  Filled 2020-06-25 (×8): qty 1

## 2020-06-25 NOTE — Progress Notes (Signed)
  Echocardiogram 2D Echocardiogram has been performed.  George Proctor 06/25/2020, 2:07 PM

## 2020-06-25 NOTE — Progress Notes (Signed)
PROGRESS NOTE  George Proctor CHY:[id number] DOB: Sep 15, 1956 DOA: 06/24/2020 PCP: [name], MD   LOS: 1 day   Brief narrative: As per HPI,  George Proctor is a 64 y.o. male with past medical history of anxiety, depression, GERD, presented to the hospital with worsening abdominal distention pain dyspnea and orthopnea.    Patient was recently seen by Dr. [name] oncology on 06/15/2020 for wide spread lymphadenopathy.  Patient underwent a PET CT scan on 06/21/2020 with findings of hypermetabolic regions throughout the body mostly in the abdomen with suspicion for peritoneal carcinomatosis as well.  Excisional biopsy has been planned for 07/27/2020.  In the interim, patient has been having complaining of increasing abdominal pain and swelling which had gotten worse and decided to come to the hospital.  In the ED, patient was noted to be in distress with mild tachycardia and tachypnea.    He was mild hypoxic at 89% on room air and was given 2 L of nasal cannula supplemental oxygen with improvement in his saturation.  Laboratory data was notable for normal CBC and chemistry except for low total protein and mildly elevated AST.  Coagulation profile was within normal limits.  Blood culture was also sent from the ED.  Chest x-ray showed some blunting of the bilateral costophrenic angles.  CT angiogram of the chest did not show any evidence of pulmonary embolism.  He did have small to moderate bilateral pleural effusion with partial atelectasis of the lower lobes.  There was note of extensive retroperitoneal, mesenteric and omental adenopathy and enlarged lymph nodes in the anterior cardiophrenic space.  He did have hydronephrosis as well.  ED provider spoke with Dr. [name] primary oncologist who advised inpatient admission for further work-up and treatment.   Assessment/Plan:  Principal Problem:   Abdominal pain Active Problems:   Anxiety   Attention deficit hyperactivity disorder (ADHD)   GERD   SOB  (shortness of breath)   Major depressive disorder   Abdominal pain, distention and discomfort.  Patient does have extensive intra-abdominal lymphadenopathy and possible peritoneal carcinomatosis as per PET scan.  Plan is biopsy on Monday.  Oncology has been notified from the ED.  Follow recommendations.    Continue IV Dilaudid for adequate pain relief.  Diet as tolerated.  No evidence of bowel obstruction.  Urinalysis was negative for any evidence of infection.  Lactic acid mildly elevated at 2.2.  Continue IV fluid hydration.  Unlikely to be infection related.  Generalized lymphadenopathy, abdominal distention, basal pleural effusion.  Patient does have some pleuritic component of pain.  Could be spectrum of lymphoma.  LDH done on 06/15/2020 was more than 1000.  Hepatitis B and C was negative.  HIV was negative as well.  Patient was seen by oncology as outpatient recently.  Plan is excisional biopsy of the lymph node on 06/26/2020.  At this time we will continue supportive care for the patient.  Peripheral edema. Could be secondary to lymphadenopathy, low albumin. Will try a dose of lasix today.   Shortness of breath, dyspnea and acute hypoxic respiratory failure on presentation.  Continue incentive spirometry, supplemental oxygen.  2 L of oxygen by nasal cannula at this time.    BNP of 29.  Chest x-ray with mild to moderate pleural effusion.  Will consider pleural tapping if worsening symptoms, worsening findings on imaging or hypoxia.   Pending 2D echocardiogram. Try a dose of lasix today.   History of depression anxiety.  Used to be on on oral  medication from home.  States that  they have not been helping. Add xanax prn  History of GERD  allergy to omeprazole.   DVT prophylaxis: enoxaparin (LOVENOX) injection 40 mg Start: 06/24/20 2000   Code Status: Full code  Family Communication: none.  Status is: Inpatient  Remains inpatient appropriate because:IV treatments appropriate due to  intensity of illness or inability to take PO, Inpatient level of care appropriate due to severity of illness and Severe abdominal pain requiring IV narcotics   Dispo: The patient is from: Home              Anticipated d/c is to: Home              Anticipated d/c date is: 2 days              Patient currently is not medically stable to d/c.  Consultants:  Oncology- notifed  Procedures:  None  Antibiotics:  . None  Anti-infectives (From admission, onward)   None      Subjective: Today, patient was seen and examined at bedside. Patient states he feels same with intermittent abdominal discomfort, pleurtic chest pain and unable to lie flat. Feels hot and sweaty.  Objective: Vitals:   06/25/20 0430 06/25/20 0549  BP: 130/88 119/87  Pulse: (!) 102 (!) 103  Resp:  18  Temp:  98.9 F (37.2 C)  SpO2: 92% 95%    Intake/Output Summary (Last 24 hours) at 06/25/2020 0810 Last data filed at 06/25/2020 0600 Gross per 24 hour  Intake 1757.46 ml  Output 0 ml  Net 1757.46 ml   Filed Weights   06/24/20 1226  Weight: 85.7 kg   Body mass index is 24.94 kg/m.   Physical Exam: GENERAL: Patient is alert awake and oriented. Not in obvious distress. Mildly hard of hearing.  HENT: No scleral pallor or icterus. Pupils equally reactive to light. Oral mucosa is moist NECK: is supple, no gross swelling noted. CHEST:  Diminished breath sounds bilaterally.  CVS: S1 and S2 heard, no murmur. Regular rate and rhythm.  ABDOMEN: distended, tender, umbilical hernia noted. EXTREMITIES: Bilateral lower extremity pitting edema noted CNS: Cranial nerves are intact. No focal motor deficits. SKIN: warm and dry without rashes.  Data Review: I have personally reviewed the following laboratory data and studies,  CBC: Recent Labs  Lab 06/24/20 1345 06/25/20 0305  WBC 5.4 4.5  NEUTROABS 4.4  --   HGB 14.8 14.0  HCT 44.4 42.5  MCV 85.5 86.6  PLT 303 742   Basic Metabolic Panel: Recent Labs   Lab 06/24/20 1345 06/25/20 0305  NA 135 134*  K 4.2 4.5  CL 99 99  CO2 23 24  GLUCOSE 91 91  BUN 15 13  CREATININE 1.15 1.05  CALCIUM 9.1 8.7*  MG  --  2.0  PHOS  --  4.2   Liver Function Tests: Recent Labs  Lab 06/24/20 1345 06/25/20 0305  AST 97* 88*  ALT 30 29  ALKPHOS 126 113  BILITOT 0.9 0.8  PROT 6.3* 5.8*  ALBUMIN 3.5 3.3*   No results for input(s): LIPASE, AMYLASE in the last 168 hours. No results for input(s): AMMONIA in the last 168 hours. Cardiac Enzymes: No results for input(s): CKTOTAL, CKMB, CKMBINDEX, TROPONINI in the last 168 hours. BNP (last 3 results) Recent Labs    06/25/20 0300  BNP 29.7    ProBNP (last 3 results) No results for input(s): PROBNP in the last 8760 hours.  CBG: Recent Labs  Lab 06/21/20 0700  GLUCAP 90   Recent Results (from the past 240 hour(s))  Respiratory Panel by RT PCR (Flu A&B, Covid) - Nasopharyngeal Swab     Status: None   Collection Time: 06/24/20  1:45 PM   Specimen: Nasopharyngeal Swab  Result Value Ref Range Status   SARS Coronavirus 2 by RT PCR NEGATIVE NEGATIVE Final    Comment: (NOTE) SARS-CoV-2 target nucleic acids are NOT DETECTED.  The SARS-CoV-2 RNA is generally detectable in upper respiratoy specimens during the acute phase of infection. The lowest concentration of SARS-CoV-2 viral copies this assay can detect is 131 copies/mL. A negative result does not preclude SARS-Cov-2 infection and should not be used as the sole basis for treatment or other patient management decisions. A negative result may occur with  improper specimen collection/handling, submission of specimen other than nasopharyngeal swab, presence of viral mutation(s) within the areas targeted by this assay, and inadequate number of viral copies (<131 copies/mL). A negative result must be combined with clinical observations, patient history, and epidemiological information. The expected result is Negative.  Fact Sheet for  Patients:  PinkCheek.be  Fact Sheet for Healthcare Providers:  GravelBags.it  This test is no t yet approved or cleared by the [place] FDA and  has been authorized for detection and/or diagnosis of SARS-CoV-2 by FDA under an Emergency Use Authorization (EUA). This EUA will remain  in effect (meaning this test can be used) for the duration of the COVID-19 declaration under Section 564(b)(1) of the Act, 21 U.S.C. section 360bbb-3(b)(1), unless the authorization is terminated or revoked sooner.     Influenza A by PCR NEGATIVE NEGATIVE Final   Influenza B by PCR NEGATIVE NEGATIVE Final    Comment: (NOTE) The Xpert Xpress SARS-CoV-2/FLU/RSV assay is intended as an aid in  the diagnosis of influenza from Nasopharyngeal swab specimens and  should not be used as a sole basis for treatment. Nasal washings and  aspirates are unacceptable for Xpert Xpress SARS-CoV-2/FLU/RSV  testing.  Fact Sheet for Patients: PinkCheek.be  Fact Sheet for Healthcare Providers: GravelBags.it  This test is not yet approved or cleared by the [place] FDA and  has been authorized for detection and/or diagnosis of SARS-CoV-2 by  FDA under an Emergency Use Authorization (EUA). This EUA will remain  in effect (meaning this test can be used) for the duration of the  Covid-19 declaration under Section 564(b)(1) of the Act, 21  U.S.C. section 360bbb-3(b)(1), unless the authorization is  terminated or revoked. Performed at [facility], [place] [address], [place], [place] [address]      Studies: CT Angio Chest PE W and/or Wo Contrast  Result Date: 06/24/2020 CLINICAL DATA:  64 year old male with recent diagnosis of lymphoma presenting with shortness of breath. Concern for pulmonary embolism. EXAM: CT ANGIOGRAPHY CHEST CT ABDOMEN AND PELVIS WITH CONTRAST TECHNIQUE:  Multidetector CT imaging of the chest was performed using the standard protocol during bolus administration of intravenous contrast. Multiplanar CT image reconstructions and MIPs were obtained to evaluate the vascular anatomy. Multidetector CT imaging of the abdomen and pelvis was performed using the standard protocol during bolus administration of intravenous contrast. CONTRAST:  132mL OMNIPAQUE IOHEXOL 350 MG/ML SOLN COMPARISON:  Chest radiograph dated 06/24/2020. FINDINGS: Evaluation of this exam is limited due to respiratory motion artifact. CTA CHEST FINDINGS Cardiovascular: There is no cardiomegaly or pericardial effusion. The thoracic aorta is unremarkable. Evaluation of the pulmonary arteries is limited due to respiratory motion artifact and suboptimal opacification  and timing of the contrast. No large or central pulmonary artery embolus identified. Mediastinum/Nodes: There is no hilar adenopathy. Enlarged lymph nodes noted anterior to the heart and in the anterior cardiophrenic space. The esophagus and the thyroid gland are grossly unremarkable. No mediastinal fluid collection. Lungs/Pleura: Small to moderate bilateral pleural effusions with associated partial compressive atelectasis the lower lobes. Pneumonia is not excluded clinical correlation is recommended. There is no pneumothorax. The central airways are patent. Musculoskeletal: No chest wall abnormality. No acute or significant osseous findings. Review of the MIP images confirms the above findings. CT ABDOMEN and PELVIS FINDINGS No intra-abdominal free air. There is moderate ascites. Hepatobiliary: The liver is unremarkable. No intrahepatic biliary ductal dilatation. The gallbladder is unremarkable. Pancreas: The pancreas is suboptimally visualized but grossly unremarkable. Spleen: Normal in size without focal abnormality. Adrenals/Urinary Tract: The adrenal glands unremarkable. There is mild left hydronephrosis. There is delayed excretion of  contrast by left kidney. The right kidney is unremarkable. The urinary bladder is grossly unremarkable. Stomach/Bowel: There is moderate stool throughout the colon. There is no bowel obstruction. The appendix is not visualized with certainty. No inflammatory changes identified in the right lower quadrant. Vascular/Lymphatic: Mild aortoiliac atherosclerotic disease. The IVC is grossly unremarkable. No portal venous gas. Extensive retroperitoneal adenopathy encasing the abdominal aorta and iliac arteries. There is diffuse mesenteric and omental implants and caking. Large soft tissue mass in the mesentery encasing the SMA measures approximately 11 x 17 cm in axial dimensions and 15 cm in craniocaudal length. Reproductive: The prostate is grossly unremarkable. Other: Diffuse subcutaneous edema. Small fat containing umbilical hernia. Musculoskeletal: No acute or significant osseous findings. Review of the MIP images confirms the above findings. IMPRESSION: 1. No CT evidence of central pulmonary artery embolus. 2. Small to moderate bilateral pleural effusions with associated partial compressive atelectasis of the lower lobes. Pneumonia is not excluded clinical correlation is recommended. 3. Extensive retroperitoneal, mesenteric, and omental adenopathy in keeping with history of lymphoma. Enlarged lymph nodes in the anterior cardiophrenic space. 4. Moderate ascites. 5. Mild left hydronephrosis. 6. Aortic Atherosclerosis (ICD10-I70.0). Electronically Signed   By: Anner Crete M.D.   On: 06/24/2020 16:02   CT ABDOMEN PELVIS W CONTRAST  Result Date: 06/24/2020 CLINICAL DATA:  64 year old male with recent diagnosis of lymphoma presenting with shortness of breath. Concern for pulmonary embolism. EXAM: CT ANGIOGRAPHY CHEST CT ABDOMEN AND PELVIS WITH CONTRAST TECHNIQUE: Multidetector CT imaging of the chest was performed using the standard protocol during bolus administration of intravenous contrast. Multiplanar CT image  reconstructions and MIPs were obtained to evaluate the vascular anatomy. Multidetector CT imaging of the abdomen and pelvis was performed using the standard protocol during bolus administration of intravenous contrast. CONTRAST:  112mL OMNIPAQUE IOHEXOL 350 MG/ML SOLN COMPARISON:  Chest radiograph dated 06/24/2020. FINDINGS: Evaluation of this exam is limited due to respiratory motion artifact. CTA CHEST FINDINGS Cardiovascular: There is no cardiomegaly or pericardial effusion. The thoracic aorta is unremarkable. Evaluation of the pulmonary arteries is limited due to respiratory motion artifact and suboptimal opacification and timing of the contrast. No large or central pulmonary artery embolus identified. Mediastinum/Nodes: There is no hilar adenopathy. Enlarged lymph nodes noted anterior to the heart and in the anterior cardiophrenic space. The esophagus and the thyroid gland are grossly unremarkable. No mediastinal fluid collection. Lungs/Pleura: Small to moderate bilateral pleural effusions with associated partial compressive atelectasis the lower lobes. Pneumonia is not excluded clinical correlation is recommended. There is no pneumothorax. The central airways are patent. Musculoskeletal:  No chest wall abnormality. No acute or significant osseous findings. Review of the MIP images confirms the above findings. CT ABDOMEN and PELVIS FINDINGS No intra-abdominal free air. There is moderate ascites. Hepatobiliary: The liver is unremarkable. No intrahepatic biliary ductal dilatation. The gallbladder is unremarkable. Pancreas: The pancreas is suboptimally visualized but grossly unremarkable. Spleen: Normal in size without focal abnormality. Adrenals/Urinary Tract: The adrenal glands unremarkable. There is mild left hydronephrosis. There is delayed excretion of contrast by left kidney. The right kidney is unremarkable. The urinary bladder is grossly unremarkable. Stomach/Bowel: There is moderate stool throughout the  colon. There is no bowel obstruction. The appendix is not visualized with certainty. No inflammatory changes identified in the right lower quadrant. Vascular/Lymphatic: Mild aortoiliac atherosclerotic disease. The IVC is grossly unremarkable. No portal venous gas. Extensive retroperitoneal adenopathy encasing the abdominal aorta and iliac arteries. There is diffuse mesenteric and omental implants and caking. Large soft tissue mass in the mesentery encasing the SMA measures approximately 11 x 17 cm in axial dimensions and 15 cm in craniocaudal length. Reproductive: The prostate is grossly unremarkable. Other: Diffuse subcutaneous edema. Small fat containing umbilical hernia. Musculoskeletal: No acute or significant osseous findings. Review of the MIP images confirms the above findings. IMPRESSION: 1. No CT evidence of central pulmonary artery embolus. 2. Small to moderate bilateral pleural effusions with associated partial compressive atelectasis of the lower lobes. Pneumonia is not excluded clinical correlation is recommended. 3. Extensive retroperitoneal, mesenteric, and omental adenopathy in keeping with history of lymphoma. Enlarged lymph nodes in the anterior cardiophrenic space. 4. Moderate ascites. 5. Mild left hydronephrosis. 6. Aortic Atherosclerosis (ICD10-I70.0). Electronically Signed   By: Anner Crete M.D.   On: 06/24/2020 16:02   DG Chest Port 1 View  Result Date: 06/24/2020 CLINICAL DATA:  Abdominal pain and swelling.  History of lymphoma. EXAM: PORTABLE CHEST 1 VIEW COMPARISON:  Chest radiograph 11/17/2017 FINDINGS: Normal cardiac and mediastinal contours. Small bilateral pleural effusions with underlying consolidation. No pleural effusion or pneumothorax. Osseous structures unremarkable. IMPRESSION: Small bilateral pleural effusions with underlying consolidation. Electronically Signed   By: Lovey Newcomer M.D.   On: 06/24/2020 14:11      Flora Lipps, MD  [facility] 06/25/2020

## 2020-06-26 ENCOUNTER — Ambulatory Visit (HOSPITAL_COMMUNITY): Payer: 59

## 2020-06-26 ENCOUNTER — Ambulatory Visit (HOSPITAL_COMMUNITY): Admission: RE | Admit: 2020-06-26 | Payer: 59 | Source: Ambulatory Visit

## 2020-06-26 ENCOUNTER — Inpatient Hospital Stay (HOSPITAL_COMMUNITY): Payer: 59

## 2020-06-26 DIAGNOSIS — R109 Unspecified abdominal pain: Secondary | ICD-10-CM | POA: Diagnosis not present

## 2020-06-26 DIAGNOSIS — C8598 Non-Hodgkin lymphoma, unspecified, lymph nodes of multiple sites: Secondary | ICD-10-CM | POA: Diagnosis not present

## 2020-06-26 DIAGNOSIS — K219 Gastro-esophageal reflux disease without esophagitis: Secondary | ICD-10-CM | POA: Diagnosis not present

## 2020-06-26 DIAGNOSIS — F909 Attention-deficit hyperactivity disorder, unspecified type: Secondary | ICD-10-CM | POA: Diagnosis not present

## 2020-06-26 DIAGNOSIS — F419 Anxiety disorder, unspecified: Secondary | ICD-10-CM | POA: Diagnosis not present

## 2020-06-26 HISTORY — PX: IR PARACENTESIS: IMG2679

## 2020-06-26 HISTORY — PX: IR US GUIDE BX ASP/DRAIN: IMG2392

## 2020-06-26 LAB — LACTATE DEHYDROGENASE, PLEURAL OR PERITONEAL FLUID: LD, Fluid: 1664 U/L — ABNORMAL HIGH (ref 3–23)

## 2020-06-26 LAB — CBC
HCT: 43.9 % (ref 39.0–52.0)
Hemoglobin: 14.1 g/dL (ref 13.0–17.0)
MCH: 28.1 pg (ref 26.0–34.0)
MCHC: 32.1 g/dL (ref 30.0–36.0)
MCV: 87.6 fL (ref 80.0–100.0)
Platelets: 297 10*3/uL (ref 150–400)
RBC: 5.01 MIL/uL (ref 4.22–5.81)
RDW: 13.2 % (ref 11.5–15.5)
WBC: 5.1 10*3/uL (ref 4.0–10.5)
nRBC: 0 % (ref 0.0–0.2)

## 2020-06-26 LAB — BASIC METABOLIC PANEL
Anion gap: 13 (ref 5–15)
BUN: 13 mg/dL (ref 8–23)
CO2: 25 mmol/L (ref 22–32)
Calcium: 8.9 mg/dL (ref 8.9–10.3)
Chloride: 96 mmol/L — ABNORMAL LOW (ref 98–111)
Creatinine, Ser: 1.17 mg/dL (ref 0.61–1.24)
GFR, Estimated: >60 mL/min (ref >60)
Glucose, Bld: 93 mg/dL (ref 70–99)
Potassium: 4 mmol/L (ref 3.5–5.1)
Sodium: 134 mmol/L — ABNORMAL LOW (ref 135–145)

## 2020-06-26 LAB — MAGNESIUM: Magnesium: 1.9 mg/dL (ref 1.7–2.4)

## 2020-06-26 LAB — URINE CULTURE: Culture: NO GROWTH

## 2020-06-26 LAB — BODY FLUID CELL COUNT WITH DIFFERENTIAL
Lymphs, Fluid: 86 %
Monocyte-Macrophage-Serous Fluid: 2 % — ABNORMAL LOW (ref 50–90)
Neutrophil Count, Fluid: 6 % (ref 0–25)
Other Cells, Fluid: 6 %
Total Nucleated Cell Count, Fluid: 60600 cu mm — ABNORMAL HIGH (ref 0–1000)

## 2020-06-26 LAB — GLUCOSE, PLEURAL OR PERITONEAL FLUID: Glucose, Fluid: <20 mg/dL

## 2020-06-26 LAB — PROTEIN, PLEURAL OR PERITONEAL FLUID: Total protein, fluid: 3.2 g/dL

## 2020-06-26 LAB — URIC ACID: Uric Acid, Serum: 13.1 mg/dL — ABNORMAL HIGH (ref 3.7–8.6)

## 2020-06-26 LAB — AMYLASE, PLEURAL OR PERITONEAL FLUID: Amylase, Fluid: 17 U/L

## 2020-06-26 LAB — ALBUMIN, PLEURAL OR PERITONEAL FLUID: Albumin, Fluid: 2.2 g/dL

## 2020-06-26 MED ORDER — ENOXAPARIN SODIUM 40 MG/0.4ML ~~LOC~~ SOLN
40.0000 mg | SUBCUTANEOUS | Status: DC
  Administered 2020-06-27 – 2020-07-02 (×5): 40 mg via SUBCUTANEOUS
  Filled 2020-06-26 (×5): qty 0.4

## 2020-06-26 MED ORDER — MIDAZOLAM HCL 2 MG/2ML IJ SOLN
INTRAMUSCULAR | Status: AC
  Filled 2020-06-26: qty 4

## 2020-06-26 MED ORDER — LORAZEPAM 2 MG/ML IJ SOLN: 0.5000 mg | Freq: Four times a day (QID) | INTRAMUSCULAR | Status: DC | PRN

## 2020-06-26 MED ORDER — ALLOPURINOL 100 MG PO TABS
100.0000 mg | ORAL_TABLET | Freq: Two times a day (BID) | ORAL | Status: DC
  Administered 2020-06-26 – 2020-07-02 (×12): 100 mg via ORAL
  Filled 2020-06-26 (×13): qty 1

## 2020-06-26 MED ORDER — MIDAZOLAM HCL 2 MG/2ML IJ SOLN
INTRAMUSCULAR | Status: AC | PRN
  Administered 2020-06-26 (×2): 1 mg via INTRAVENOUS

## 2020-06-26 MED ORDER — FENTANYL CITRATE (PF) 100 MCG/2ML IJ SOLN
INTRAMUSCULAR | Status: AC | PRN
  Administered 2020-06-26 (×2): 50 ug via INTRAVENOUS

## 2020-06-26 MED ORDER — PREDNISONE 20 MG PO TABS
20.0000 mg | ORAL_TABLET | Freq: Once | ORAL | Status: AC
  Administered 2020-06-26: 20 mg via ORAL
  Filled 2020-06-26: qty 1

## 2020-06-26 MED ORDER — LIDOCAINE HCL (PF) 1 % IJ SOLN
INTRAMUSCULAR | Status: AC | PRN
  Administered 2020-06-26: 10 mL via INTRADERMAL

## 2020-06-26 MED ORDER — LIDOCAINE HCL 1 % IJ SOLN
INTRAMUSCULAR | Status: AC
  Filled 2020-06-26: qty 20

## 2020-06-26 MED ORDER — PREDNISONE 20 MG PO TABS
80.0000 mg | ORAL_TABLET | Freq: Every day | ORAL | Status: DC
  Administered 2020-06-26 – 2020-06-29 (×4): 80 mg via ORAL
  Filled 2020-06-26 (×4): qty 4

## 2020-06-26 MED ORDER — FENTANYL CITRATE (PF) 100 MCG/2ML IJ SOLN
INTRAMUSCULAR | Status: AC
  Filled 2020-06-26: qty 2

## 2020-06-26 NOTE — Progress Notes (Signed)
PROGRESS NOTE  George Proctor TJQ:[id number] DOB: [birth year] DOA: 06/24/2020 PCP: [name], MD   LOS: 2 days   Brief narrative: As per HPI,  George Proctor is a 64 y.o. male with past medical history of anxiety, depression, GERD, presented to the hospital with worsening abdominal distention pain dyspnea and orthopnea.    Patient was recently seen by Dr. [name] oncology on 06/15/2020 for wide spread lymphadenopathy.  Patient underwent a PET CT scan on 06/21/2020 with findings of hypermetabolic regions throughout the body mostly in the abdomen with suspicion for peritoneal carcinomatosis as well.  Excisional biopsy has been planned for 07/27/2020.  In the interim, patient has been having complaining of increasing abdominal pain and swelling which had gotten worse and decided to come to the hospital.  In the ED, patient was noted to be in distress with mild tachycardia and tachypnea.    He was mild hypoxic at 89% on room air and was given 2 L of nasal cannula supplemental oxygen with improvement in his saturation.  Laboratory data was notable for normal CBC and chemistry except for low total protein and mildly elevated AST.  Coagulation profile was within normal limits.  Blood culture was also sent from the ED.  Chest x-ray showed some blunting of the bilateral costophrenic angles.  CT angiogram of the chest did not show any evidence of pulmonary embolism.  He did have small to moderate bilateral pleural effusion with partial atelectasis of the lower lobes.  There was note of extensive retroperitoneal, mesenteric and omental adenopathy and enlarged lymph nodes in the anterior cardiophrenic space.  He did have hydronephrosis as well.  ED provider spoke with Dr. [name] primary oncologist who advised inpatient admission for further work-up and treatment.   Assessment/Plan:  Principal Problem:   Abdominal pain Active Problems:   Anxiety   Attention deficit hyperactivity disorder (ADHD)   GERD   SOB  (shortness of breath)   Major depressive disorder  Abdominal pain, distention and discomfort.  Patient does have extensive intra-abdominal lymphadenopathy and possible peritoneal carcinomatosis as per PET scan.  Plan is biopsy by interventional radiology on 06/26/2020. Oncology has been notified.  Continue IV Dilaudid for adequate pain relief.  Has been kept n.p.o. after midnight.  No evidence of bowel obstruction.  Urinalysis was negative for any evidence of infection.   Generalized lymphadenopathy, abdominal distention, basal pleural effusion.  Patient does have some pleuritic component of pain.  Could be spectrum of lymphoma.  LDH done on 06/15/2020 was more than 1000.  Hepatitis B and C was negative.  HIV was negative as well.  Awaiting for IR guided biopsy today.  Peripheral edema. Could be secondary to lymphadenopathy, low albumin.  Received dose of lasix on 06/25/2020 with mild improvement of swelling.  Shortness of breath, dyspnea and acute hypoxic respiratory failure on presentation.  Continue incentive spirometry, supplemental oxygen.  2 L of oxygen by nasal cannula at this time.    BNP of 29.  Chest x-ray with mild to moderate pleural effusion.  Will consider pleural tapping if worsening symptoms, worsening findings on imaging or hypoxia.   2D echocardiogram done on 06/25/2020 showed LV ejection fraction of 65 to 70% with grade 1 diastolic dysfunction.  Received 1 dose of IV Lasix yesterday.  Still on 2 L of oxygen.  History of depression anxiety.  Used to be on on oral medication from home.  States that  they have not been helping.  On Xanax as needed.  History  of GERD  intolerance omeprazole but could  tolerate Protonix   DVT prophylaxis: enoxaparin (LOVENOX) injection 40 mg Start: 06/24/20 2000   Code Status: Full code  Family Communication: none.  Patient states that he has been communicating with the family.  Status is: Inpatient  Remains inpatient appropriate because:IV  treatments appropriate due to intensity of illness or inability to take PO, Inpatient level of care appropriate due to severity of illness and Severe abdominal pain requiring IV narcotics, need for IR guided biopsy.   Dispo: The patient is from: Home              Anticipated d/c is to: Home              Anticipated d/c date is: 2 days              Patient currently is not medically stable to d/c.  Consultants:  Oncology- notifed  Interventional radiology  Procedures:  None yet  Antibiotics:  . None  Anti-infectives (From admission, onward)   None     Subjective: Today, patient was seen and examined at bedside.  Patient complains of intermittent abdominal discomfort had urinated some yesterday.  Has had bowel movements.  No nausea vomiting.  Still has a pleuritic chest pain and diaphoresis in the nighttime.  Objective: Vitals:   06/26/20 1115 06/26/20 1120  BP: 124/88 120/89  Pulse: (!) 109 (!) 104  Resp: 19 10  Temp:    SpO2: 95% 94%    Intake/Output Summary (Last 24 hours) at 06/26/2020 1128 Last data filed at 06/26/2020 1000 Gross per 24 hour  Intake 680.03 ml  Output 2850 ml  Net -2169.97 ml   Filed Weights   06/24/20 1226  Weight: 85.7 kg   Body mass index is 24.94 kg/m.   Physical Exam: GENERAL: Patient is alert awake and oriented. Not in obvious distress. Mildly hard of hearing.  On nasal cannula oxygen. HENT: No scleral pallor or icterus. Pupils equally reactive to light. Oral mucosa is moist NECK: is supple, no gross swelling noted. CHEST:  Diminished breath sounds bilaterally.  Coarse breath sounds noted over the bases. CVS: S1 and S2 heard, no murmur. Regular rate and rhythm.  ABDOMEN: distended, tender, umbilical hernia noted. EXTREMITIES: Bilateral lower extremity pitting edema noted, slightly diminished. CNS: Cranial nerves are intact. No focal motor deficits. SKIN: warm and dry without rashes.  Data Review: I have personally reviewed the  following laboratory data and studies,  CBC: Recent Labs  Lab 06/24/20 1345 06/25/20 0305 06/26/20 0326  WBC 5.4 4.5 5.1  NEUTROABS 4.4  --   --   HGB 14.8 14.0 14.1  HCT 44.4 42.5 43.9  MCV 85.5 86.6 87.6  PLT 303 278 563   Basic Metabolic Panel: Recent Labs  Lab 06/24/20 1345 06/25/20 0305 06/26/20 0326  NA 135 134* 134*  K 4.2 4.5 4.0  CL 99 99 96*  CO2 23 24 25   GLUCOSE 91 91 93  BUN 15 13 13   CREATININE 1.15 1.05 1.17  CALCIUM 9.1 8.7* 8.9  MG  --  2.0 1.9  PHOS  --  4.2  --    Liver Function Tests: Recent Labs  Lab 06/24/20 1345 06/25/20 0305  AST 97* 88*  ALT 30 29  ALKPHOS 126 113  BILITOT 0.9 0.8  PROT 6.3* 5.8*  ALBUMIN 3.5 3.3*   No results for input(s): LIPASE, AMYLASE in the last 168 hours. No results for input(s): AMMONIA in the last 168  hours. Cardiac Enzymes: No results for input(s): CKTOTAL, CKMB, CKMBINDEX, TROPONINI in the last 168 hours. BNP (last 3 results) Recent Labs    06/25/20 0300  BNP 29.7    ProBNP (last 3 results) No results for input(s): PROBNP in the last 8760 hours.  CBG: Recent Labs  Lab 06/21/20 0700  GLUCAP 90   Recent Results (from the past 240 hour(s))  Blood culture (routine single)     Status: None (Preliminary result)   Collection Time: 06/24/20  1:45 PM   Specimen: BLOOD  Result Value Ref Range Status   Specimen Description   Final    BLOOD Performed at [place] [address] [place], [place] [address]    Special Requests   Final    NONE Performed at [facility], [place] [address] [place], [place] [address]    Culture   Final    NO GROWTH 2 DAYS Performed at [place] [address] [place], [place] [address]    Report Status PENDING  Incomplete  Respiratory Panel by RT PCR (Flu A&B, Covid) - Nasopharyngeal Swab     Status: None   Collection Time: 06/24/20  1:45 PM   Specimen: Nasopharyngeal Swab  Result Value Ref Range Status   SARS  Coronavirus 2 by RT PCR NEGATIVE NEGATIVE Final    Comment: (NOTE) SARS-CoV-2 target nucleic acids are NOT DETECTED.  The SARS-CoV-2 RNA is generally detectable in upper respiratoy specimens during the acute phase of infection. The lowest concentration of SARS-CoV-2 viral copies this assay can detect is 131 copies/mL. A negative result does not preclude SARS-Cov-2 infection and should not be used as the sole basis for treatment or other patient management decisions. A negative result may occur with  improper specimen collection/handling, submission of specimen other than nasopharyngeal swab, presence of viral mutation(s) within the areas targeted by this assay, and inadequate number of viral copies (<131 copies/mL). A negative result must be combined with clinical observations, patient history, and epidemiological information. The expected result is Negative.  Fact Sheet for Patients:  PinkCheek.be  Fact Sheet for Healthcare Providers:  GravelBags.it  This test is no t yet approved or cleared by the [place] FDA and  has been authorized for detection and/or diagnosis of SARS-CoV-2 by FDA under an Emergency Use Authorization (EUA). This EUA will remain  in effect (meaning this test can be used) for the duration of the COVID-19 declaration under Section 564(b)(1) of the Act, 21 U.S.C. section 360bbb-3(b)(1), unless the authorization is terminated or revoked sooner.     Influenza A by PCR NEGATIVE NEGATIVE Final   Influenza B by PCR NEGATIVE NEGATIVE Final    Comment: (NOTE) The Xpert Xpress SARS-CoV-2/FLU/RSV assay is intended as an aid in  the diagnosis of influenza from Nasopharyngeal swab specimens and  should not be used as a sole basis for treatment. Nasal washings and  aspirates are unacceptable for Xpert Xpress SARS-CoV-2/FLU/RSV  testing.  Fact Sheet for  Patients: PinkCheek.be  Fact Sheet for Healthcare Providers: GravelBags.it  This test is not yet approved or cleared by the [place] FDA and  has been authorized for detection and/or diagnosis of SARS-CoV-2 by  FDA under an Emergency Use Authorization (EUA). This EUA will remain  in effect (meaning this test can be used) for the duration of the  Covid-19 declaration under Section 564(b)(1) of the Act, 21  U.S.C. section 360bbb-3(b)(1), unless the authorization is  terminated or revoked. Performed at  [facility], [place] [address] [place], [place] [address]   Urine culture     Status: None   Collection Time: 06/24/20  5:28 PM   Specimen: In/Out Cath Urine  Result Value Ref Range Status   Specimen Description   Final    IN/OUT CATH URINE Performed at [place] [address] [place], [place] [address]    Special Requests   Final    NONE Performed at [facility], [place] [address], [place], [place] [address]    Culture   Final    NO GROWTH Performed at [facility], [place] [address] [place], [place] [address]    Report Status 06/26/2020 FINAL  Final     Studies: CT Angio Chest PE W and/or Wo Contrast  Result Date: 06/24/2020 CLINICAL DATA:  64 year old male with recent diagnosis of lymphoma presenting with shortness of breath. Concern for pulmonary embolism. EXAM: CT ANGIOGRAPHY CHEST CT ABDOMEN AND PELVIS WITH CONTRAST TECHNIQUE: Multidetector CT imaging of the chest was performed using the standard protocol during bolus administration of intravenous contrast. Multiplanar CT image reconstructions and MIPs were obtained to evaluate the vascular anatomy. Multidetector CT imaging of the abdomen and pelvis was performed using the standard protocol during bolus administration of intravenous contrast. CONTRAST:  139mL OMNIPAQUE IOHEXOL 350 MG/ML SOLN COMPARISON:   Chest radiograph dated 06/24/2020. FINDINGS: Evaluation of this exam is limited due to respiratory motion artifact. CTA CHEST FINDINGS Cardiovascular: There is no cardiomegaly or pericardial effusion. The thoracic aorta is unremarkable. Evaluation of the pulmonary arteries is limited due to respiratory motion artifact and suboptimal opacification and timing of the contrast. No large or central pulmonary artery embolus identified. Mediastinum/Nodes: There is no hilar adenopathy. Enlarged lymph nodes noted anterior to the heart and in the anterior cardiophrenic space. The esophagus and the thyroid gland are grossly unremarkable. No mediastinal fluid collection. Lungs/Pleura: Small to moderate bilateral pleural effusions with associated partial compressive atelectasis the lower lobes. Pneumonia is not excluded clinical correlation is recommended. There is no pneumothorax. The central airways are patent. Musculoskeletal: No chest wall abnormality. No acute or significant osseous findings. Review of the MIP images confirms the above findings. CT ABDOMEN and PELVIS FINDINGS No intra-abdominal free air. There is moderate ascites. Hepatobiliary: The liver is unremarkable. No intrahepatic biliary ductal dilatation. The gallbladder is unremarkable. Pancreas: The pancreas is suboptimally visualized but grossly unremarkable. Spleen: Normal in size without focal abnormality. Adrenals/Urinary Tract: The adrenal glands unremarkable. There is mild left hydronephrosis. There is delayed excretion of contrast by left kidney. The right kidney is unremarkable. The urinary bladder is grossly unremarkable. Stomach/Bowel: There is moderate stool throughout the colon. There is no bowel obstruction. The appendix is not visualized with certainty. No inflammatory changes identified in the right lower quadrant. Vascular/Lymphatic: Mild aortoiliac atherosclerotic disease. The IVC is grossly unremarkable. No portal venous gas. Extensive  retroperitoneal adenopathy encasing the abdominal aorta and iliac arteries. There is diffuse mesenteric and omental implants and caking. Large soft tissue mass in the mesentery encasing the SMA measures approximately 11 x 17 cm in axial dimensions and 15 cm in craniocaudal length. Reproductive: The prostate is grossly unremarkable. Other: Diffuse subcutaneous edema. Small fat containing umbilical hernia. Musculoskeletal: No acute or significant osseous findings. Review of the MIP images confirms the above findings. IMPRESSION: 1. No CT evidence of central pulmonary artery embolus. 2. Small to moderate bilateral pleural effusions with associated partial compressive atelectasis of the lower lobes. Pneumonia  is not excluded clinical correlation is recommended. 3. Extensive retroperitoneal, mesenteric, and omental adenopathy in keeping with history of lymphoma. Enlarged lymph nodes in the anterior cardiophrenic space. 4. Moderate ascites. 5. Mild left hydronephrosis. 6. Aortic Atherosclerosis (ICD10-I70.0). Electronically Signed   By: Anner Crete M.D.   On: 06/24/2020 16:02   CT ABDOMEN PELVIS W CONTRAST  Result Date: 06/24/2020 CLINICAL DATA:  64 year old male with recent diagnosis of lymphoma presenting with shortness of breath. Concern for pulmonary embolism. EXAM: CT ANGIOGRAPHY CHEST CT ABDOMEN AND PELVIS WITH CONTRAST TECHNIQUE: Multidetector CT imaging of the chest was performed using the standard protocol during bolus administration of intravenous contrast. Multiplanar CT image reconstructions and MIPs were obtained to evaluate the vascular anatomy. Multidetector CT imaging of the abdomen and pelvis was performed using the standard protocol during bolus administration of intravenous contrast. CONTRAST:  124mL OMNIPAQUE IOHEXOL 350 MG/ML SOLN COMPARISON:  Chest radiograph dated 06/24/2020. FINDINGS: Evaluation of this exam is limited due to respiratory motion artifact. CTA CHEST FINDINGS Cardiovascular:  There is no cardiomegaly or pericardial effusion. The thoracic aorta is unremarkable. Evaluation of the pulmonary arteries is limited due to respiratory motion artifact and suboptimal opacification and timing of the contrast. No large or central pulmonary artery embolus identified. Mediastinum/Nodes: There is no hilar adenopathy. Enlarged lymph nodes noted anterior to the heart and in the anterior cardiophrenic space. The esophagus and the thyroid gland are grossly unremarkable. No mediastinal fluid collection. Lungs/Pleura: Small to moderate bilateral pleural effusions with associated partial compressive atelectasis the lower lobes. Pneumonia is not excluded clinical correlation is recommended. There is no pneumothorax. The central airways are patent. Musculoskeletal: No chest wall abnormality. No acute or significant osseous findings. Review of the MIP images confirms the above findings. CT ABDOMEN and PELVIS FINDINGS No intra-abdominal free air. There is moderate ascites. Hepatobiliary: The liver is unremarkable. No intrahepatic biliary ductal dilatation. The gallbladder is unremarkable. Pancreas: The pancreas is suboptimally visualized but grossly unremarkable. Spleen: Normal in size without focal abnormality. Adrenals/Urinary Tract: The adrenal glands unremarkable. There is mild left hydronephrosis. There is delayed excretion of contrast by left kidney. The right kidney is unremarkable. The urinary bladder is grossly unremarkable. Stomach/Bowel: There is moderate stool throughout the colon. There is no bowel obstruction. The appendix is not visualized with certainty. No inflammatory changes identified in the right lower quadrant. Vascular/Lymphatic: Mild aortoiliac atherosclerotic disease. The IVC is grossly unremarkable. No portal venous gas. Extensive retroperitoneal adenopathy encasing the abdominal aorta and iliac arteries. There is diffuse mesenteric and omental implants and caking. Large soft tissue mass  in the mesentery encasing the SMA measures approximately 11 x 17 cm in axial dimensions and 15 cm in craniocaudal length. Reproductive: The prostate is grossly unremarkable. Other: Diffuse subcutaneous edema. Small fat containing umbilical hernia. Musculoskeletal: No acute or significant osseous findings. Review of the MIP images confirms the above findings. IMPRESSION: 1. No CT evidence of central pulmonary artery embolus. 2. Small to moderate bilateral pleural effusions with associated partial compressive atelectasis of the lower lobes. Pneumonia is not excluded clinical correlation is recommended. 3. Extensive retroperitoneal, mesenteric, and omental adenopathy in keeping with history of lymphoma. Enlarged lymph nodes in the anterior cardiophrenic space. 4. Moderate ascites. 5. Mild left hydronephrosis. 6. Aortic Atherosclerosis (ICD10-I70.0). Electronically Signed   By: Anner Crete M.D.   On: 06/24/2020 16:02   DG Chest Port 1 View  Result Date: 06/24/2020 CLINICAL DATA:  Abdominal pain and swelling.  History of lymphoma. EXAM: PORTABLE CHEST 1  VIEW COMPARISON:  Chest radiograph 11/17/2017 FINDINGS: Normal cardiac and mediastinal contours. Small bilateral pleural effusions with underlying consolidation. No pleural effusion or pneumothorax. Osseous structures unremarkable. IMPRESSION: Small bilateral pleural effusions with underlying consolidation. Electronically Signed   By: Lovey Newcomer M.D.   On: 06/24/2020 14:11   ECHOCARDIOGRAM COMPLETE  Result Date: 06/25/2020    ECHOCARDIOGRAM REPORT   Patient Name:   George Proctor Date of Exam: 06/25/2020 Medical Rec #:  [id number]      Height:       73.0 in Accession #:    [id number]     Weight:       189.0 lb Date of Birth:  Oct 30, 1955      BSA:          2.101 m Patient Age:    68 years       BP:           136/92 mmHg Patient Gender: M              HR:           120 bpm. Exam Location:  Inpatient Procedure: 2D Echo Indications:    786.09 dyspnea  History:         Patient has prior history of Echocardiogram examinations, most                 recent 08/11/2019. Lymphoma.  Sonographer:    Jannett Celestine RDCS (AE) Referring Phys: [phone] [facility] [place]  Sonographer Comments: Suboptimal parasternal window and suboptimal subcostal window. IMPRESSIONS  1. Left ventricular ejection fraction, by estimation, is 65 to 70%. The left ventricle has normal function. The left ventricle has no regional wall motion abnormalities. Left ventricular diastolic parameters are consistent with Grade I diastolic dysfunction (impaired relaxation).  2. Right ventricular systolic function is hyperdynamic. The right ventricular size is normal.  3. The mitral valve is normal in structure. No evidence of mitral valve regurgitation. No evidence of mitral stenosis.  4. The aortic valve is normal in structure. Aortic valve regurgitation is not visualized. No aortic stenosis is present. FINDINGS  Left Ventricle: Left ventricular ejection fraction, by estimation, is 65 to 70%. The left ventricle has normal function. The left ventricle has no regional wall motion abnormalities. The left ventricular internal cavity size was normal in size. There is  no left ventricular hypertrophy. Left ventricular diastolic parameters are consistent with Grade I diastolic dysfunction (impaired relaxation). Right Ventricle: The right ventricular size is normal. No increase in right ventricular wall thickness. Right ventricular systolic function is hyperdynamic. Left Atrium: Left atrial size was normal in size. Right Atrium: Right atrial size was normal in size. Pericardium: There is no evidence of pericardial effusion. Mitral Valve: The mitral valve is normal in structure. No evidence of mitral valve regurgitation. No evidence of mitral valve stenosis. Tricuspid Valve: The tricuspid valve is normal in structure. Tricuspid valve regurgitation is trivial. No evidence of tricuspid stenosis. Aortic Valve: The aortic valve is  normal in structure. Aortic valve regurgitation is not visualized. No aortic stenosis is present. Pulmonic Valve: The pulmonic valve was normal in structure. Pulmonic valve regurgitation is not visualized. No evidence of pulmonic stenosis. Aorta: The aortic root is normal in size and structure. Venous: The inferior vena cava was not well visualized. IAS/Shunts: No atrial level shunt detected by color flow Doppler.  LEFT VENTRICLE PLAX 2D LVOT diam:     2.50 cm LV SV:         72 LV  SV Index:   34 LVOT Area:     4.91 cm  RIGHT VENTRICLE RV S prime:     18.00 cm/s TAPSE (M-mode): 1.8 cm LEFT ATRIUM             Index       RIGHT ATRIUM           Index LA Vol (A2C):   60.4 ml 28.75 ml/m RA Area:     15.80 cm LA Vol (A4C):   42.5 ml 20.23 ml/m RA Volume:   32.40 ml  15.42 ml/m LA Biplane Vol: 54.0 ml 25.71 ml/m  AORTIC VALVE LVOT Vmax:   95.30 cm/s LVOT Vmean:  74.500 cm/s LVOT VTI:    0.146 m MITRAL VALVE               TRICUSPID VALVE MV Area (PHT): 3.31 cm    TR Peak grad:   13.7 mmHg MV Decel Time: 229 msec    TR Vmax:        185.00 cm/s MV E velocity: 69.40 cm/s MV A velocity: 87.00 cm/s  SHUNTS MV E/A ratio:  0.80        Systemic VTI:  0.15 m                            Systemic Diam: 2.50 cm Skeet Latch MD Electronically signed by Skeet Latch MD Signature Date/Time: 06/25/2020/4:06:39 PM    Final       Flora Lipps, MD  [facility] 06/26/2020

## 2020-06-26 NOTE — Procedures (Signed)
Pre Procedure Dx: Inguinal lymphadenopathy; symptomatic ascites Post Procedural Dx: Same  Successful US guided paracentesis yielding 2 L of chylous appearing fluid. Technically successful US guided biopsy of right inguinal lymph node.  EBL: None No immediate complications.   Ronny Bacon, MD Pager #: [phone]

## 2020-06-26 NOTE — Progress Notes (Addendum)
MEDICATION-RELATED CONSULT NOTE   IR Procedure Consult - Anticoagulant/Antiplatelet PTA/Inpatient Med List Review by Pharmacist    Procedure: US guided paracentesis & US guided biopsy of right inguinal lymph node.    Completed: 06/26/2020 11:45 am   Post-Procedural bleeding risk per IR MD assessment:  standard  Antithrombotic medications on inpatient or PTA profile prior to procedure:   [place] 40    Recommended restart time per IR Post-Procedure Guidelines:  Day +1 (next AM)   Other considerations:      Plan:    LMWH 40 qday. Next dose 10/12 AM  Eudelia Bunch, Pharm.D 06/26/2020 12:54 PM

## 2020-06-26 NOTE — Care Management Important Message (Signed)
Important Message  Patient Details  Name: George Proctor MRN: [id number] Date of Birth: 08/31/1956   Medicare Important Message Given:  Yes     [name] 06/26/2020, 12:16 PM

## 2020-06-26 NOTE — Consult Note (Signed)
Chief Complaint: Patient was seen in consultation today for image guided inguinal lymph node biopsy and possible paracentesis Chief Complaint  Patient presents with  . Abdominal Pain    Referring Physician(s): Marion  Supervising Physician: Sandi Mariscal  Patient Status: [facility] - In-pt  History of Present Illness: George BRUMMET is a 64 year old white male with history of anxiety/depression and recent symptoms of abdominal pain, dyspnea with exertion, nausea, weight loss, and night sweats.  Subsequent imaging has revealed hypermetabolic adenopathy throughout the neck, chest, abdomen and pelvis with extensive peritoneal carcinomatosis concerning for lymphoma.  He also has small to moderate bilateral pleural effusions, mild left hydronephrosis and ascites.  He has no prior history of cancer.  He was originally scheduled for outpatient lymph node biopsy at [facility] today,however was admitted to [facility] over the weekend with worsening abdominal pain and dyspnea.  He is currently afebrile, WBC normal, PT/INR normal, creatinine normal.  Lactic acid 2.2.  COVID-19 negative.  Past Medical History:  Diagnosis Date  . Anxiety   . Depression   . Gait difficulty   . Lymphoma ([place])   . Memory loss     Past Surgical History:  Procedure Laterality Date  . ABDOMINAL SURGERY     Childhood  . BUNIONECTOMY      Allergies: Omeprazole  Medications: Prior to Admission medications   Medication Sig Start Date End Date Taking? Authorizing Provider  Ascorbic Acid (VITAMIN C) 1000 MG tablet Take 1,000 mg by mouth daily.   Yes [provider]  B Complex Vitamins (VITAMIN B COMPLEX PO) Take 1 tablet by mouth daily.    Yes [provider]  Multiple Vitamin (MULTIVITAMIN) capsule Take 1 capsule by mouth daily.   Yes [provider]  oxyCODONE (OXY IR/ROXICODONE) 5 MG immediate release tablet Take 1 tablet (5 mg total) by mouth every 4 (four) hours as  needed for severe pain. 06/21/20  Yes Brunetta Genera, MD  pantoprazole (PROTONIX) 40 MG tablet Take 40 mg by mouth 2 (two) times daily as needed (indigestion).  01/10/19  Yes [provider]  polyethylene glycol (MIRALAX) 17 g packet Take 17 g by mouth daily. 06/21/20  Yes Brunetta Genera, MD  senna-docusate (SENNA S) 8.6-50 MG tablet Take 2 tablets by mouth at bedtime. 06/21/20  Yes Brunetta Genera, MD  testosterone cypionate (DEPOTESTOSTERONE CYPIONATE) 200 MG/ML injection Inject into the muscle once a week.  12/25/19  Yes [provider]     Family History  Problem Relation Age of Onset  . Aneurysm Mother 3       Thoracic  . Hypertension Father   . Heart failure Father   . Heart disease Father   . Heart attack Paternal Grandfather   . Depression Cousin   . Bipolar disorder Cousin   . Schizophrenia Maternal Uncle     Social History   Socioeconomic History  . Marital status: Married    Spouse name: Not on file  . Number of children: 0  . Years of education: Veterinary surgeon  . Highest education level: Not on file  Occupational History  . Occupation: Scientist, research (physical sciences)  Tobacco Use  . Smoking status: Never Smoker  . Smokeless tobacco: Never Used  Vaping Use  . Vaping Use: Never used  Substance and Sexual Activity  . Alcohol use: No    Alcohol/week: 0.0 standard drinks    Comment: Less than one drink per week.  . Drug use: No  .  Sexual activity: Not Currently  Other Topics Concern  . Not on file  Social History Narrative   Lives at home with wife.   Right-handed.   3 cups caffeine per day.   Social Determinants of Health   Financial Resource Strain:   . Difficulty of Paying Living Expenses: Not on file  Food Insecurity:   . Worried About Charity fundraiser in the Last Year: Not on file  . Ran Out of Food in the Last Year: Not on file  Transportation Needs:   . Lack of Transportation (Medical): Not on file  . Lack of  Transportation (Non-Medical): Not on file  Physical Activity:   . Days of Exercise per Week: Not on file  . Minutes of Exercise per Session: Not on file  Stress:   . Feeling of Stress : Not on file  Social Connections:   . Frequency of Communication with Friends and Family: Not on file  . Frequency of Social Gatherings with Friends and Family: Not on file  . Attends Religious Services: Not on file  . Active Member of Clubs or Organizations: Not on file  . Attends Archivist Meetings: Not on file  . Marital Status: Not on file     Review of Systems see above; denies headache, chest pain, cough, back pain, vomiting or bleeding  Vital Signs: BP (!) 128/95 (BP Location: Right Arm)   Pulse (!) 109   Temp 97.7 F (36.5 C) (Oral)   Resp 18   Ht 6\' 1"  (1.854 m)   Wt 189 lb (85.7 kg)   SpO2 95%   BMI 24.94 kg/m   Physical Exam awake, alert.  Chest with diminished breath sounds at bases.  Heart with slightly tachycardic but regular rhythm.  Abdomen taut, mildly distended, positive bowel sounds, currently nontender.  Bilateral pretibial edema noted.  Imaging: CT Angio Chest PE W and/or Wo Contrast  Result Date: 06/24/2020 CLINICAL DATA:  64 year old male with recent diagnosis of lymphoma presenting with shortness of breath. Concern for pulmonary embolism. EXAM: CT ANGIOGRAPHY CHEST CT ABDOMEN AND PELVIS WITH CONTRAST TECHNIQUE: Multidetector CT imaging of the chest was performed using the standard protocol during bolus administration of intravenous contrast. Multiplanar CT image reconstructions and MIPs were obtained to evaluate the vascular anatomy. Multidetector CT imaging of the abdomen and pelvis was performed using the standard protocol during bolus administration of intravenous contrast. CONTRAST:  186mL OMNIPAQUE IOHEXOL 350 MG/ML SOLN COMPARISON:  Chest radiograph dated 06/24/2020. FINDINGS: Evaluation of this exam is limited due to respiratory motion artifact. CTA CHEST  FINDINGS Cardiovascular: There is no cardiomegaly or pericardial effusion. The thoracic aorta is unremarkable. Evaluation of the pulmonary arteries is limited due to respiratory motion artifact and suboptimal opacification and timing of the contrast. No large or central pulmonary artery embolus identified. Mediastinum/Nodes: There is no hilar adenopathy. Enlarged lymph nodes noted anterior to the heart and in the anterior cardiophrenic space. The esophagus and the thyroid gland are grossly unremarkable. No mediastinal fluid collection. Lungs/Pleura: Small to moderate bilateral pleural effusions with associated partial compressive atelectasis the lower lobes. Pneumonia is not excluded clinical correlation is recommended. There is no pneumothorax. The central airways are patent. Musculoskeletal: No chest wall abnormality. No acute or significant osseous findings. Review of the MIP images confirms the above findings. CT ABDOMEN and PELVIS FINDINGS No intra-abdominal free air. There is moderate ascites. Hepatobiliary: The liver is unremarkable. No intrahepatic biliary ductal dilatation. The gallbladder is unremarkable. Pancreas: The pancreas is  suboptimally visualized but grossly unremarkable. Spleen: Normal in size without focal abnormality. Adrenals/Urinary Tract: The adrenal glands unremarkable. There is mild left hydronephrosis. There is delayed excretion of contrast by left kidney. The right kidney is unremarkable. The urinary bladder is grossly unremarkable. Stomach/Bowel: There is moderate stool throughout the colon. There is no bowel obstruction. The appendix is not visualized with certainty. No inflammatory changes identified in the right lower quadrant. Vascular/Lymphatic: Mild aortoiliac atherosclerotic disease. The IVC is grossly unremarkable. No portal venous gas. Extensive retroperitoneal adenopathy encasing the abdominal aorta and iliac arteries. There is diffuse mesenteric and omental implants and  caking. Large soft tissue mass in the mesentery encasing the SMA measures approximately 11 x 17 cm in axial dimensions and 15 cm in craniocaudal length. Reproductive: The prostate is grossly unremarkable. Other: Diffuse subcutaneous edema. Small fat containing umbilical hernia. Musculoskeletal: No acute or significant osseous findings. Review of the MIP images confirms the above findings. IMPRESSION: 1. No CT evidence of central pulmonary artery embolus. 2. Small to moderate bilateral pleural effusions with associated partial compressive atelectasis of the lower lobes. Pneumonia is not excluded clinical correlation is recommended. 3. Extensive retroperitoneal, mesenteric, and omental adenopathy in keeping with history of lymphoma. Enlarged lymph nodes in the anterior cardiophrenic space. 4. Moderate ascites. 5. Mild left hydronephrosis. 6. Aortic Atherosclerosis (ICD10-I70.0). Electronically Signed   By: Anner Crete M.D.   On: 06/24/2020 16:02   CT ABDOMEN PELVIS W CONTRAST  Result Date: 06/24/2020 CLINICAL DATA:  64 year old male with recent diagnosis of lymphoma presenting with shortness of breath. Concern for pulmonary embolism. EXAM: CT ANGIOGRAPHY CHEST CT ABDOMEN AND PELVIS WITH CONTRAST TECHNIQUE: Multidetector CT imaging of the chest was performed using the standard protocol during bolus administration of intravenous contrast. Multiplanar CT image reconstructions and MIPs were obtained to evaluate the vascular anatomy. Multidetector CT imaging of the abdomen and pelvis was performed using the standard protocol during bolus administration of intravenous contrast. CONTRAST:  16mL OMNIPAQUE IOHEXOL 350 MG/ML SOLN COMPARISON:  Chest radiograph dated 06/24/2020. FINDINGS: Evaluation of this exam is limited due to respiratory motion artifact. CTA CHEST FINDINGS Cardiovascular: There is no cardiomegaly or pericardial effusion. The thoracic aorta is unremarkable. Evaluation of the pulmonary arteries is  limited due to respiratory motion artifact and suboptimal opacification and timing of the contrast. No large or central pulmonary artery embolus identified. Mediastinum/Nodes: There is no hilar adenopathy. Enlarged lymph nodes noted anterior to the heart and in the anterior cardiophrenic space. The esophagus and the thyroid gland are grossly unremarkable. No mediastinal fluid collection. Lungs/Pleura: Small to moderate bilateral pleural effusions with associated partial compressive atelectasis the lower lobes. Pneumonia is not excluded clinical correlation is recommended. There is no pneumothorax. The central airways are patent. Musculoskeletal: No chest wall abnormality. No acute or significant osseous findings. Review of the MIP images confirms the above findings. CT ABDOMEN and PELVIS FINDINGS No intra-abdominal free air. There is moderate ascites. Hepatobiliary: The liver is unremarkable. No intrahepatic biliary ductal dilatation. The gallbladder is unremarkable. Pancreas: The pancreas is suboptimally visualized but grossly unremarkable. Spleen: Normal in size without focal abnormality. Adrenals/Urinary Tract: The adrenal glands unremarkable. There is mild left hydronephrosis. There is delayed excretion of contrast by left kidney. The right kidney is unremarkable. The urinary bladder is grossly unremarkable. Stomach/Bowel: There is moderate stool throughout the colon. There is no bowel obstruction. The appendix is not visualized with certainty. No inflammatory changes identified in the right lower quadrant. Vascular/Lymphatic: Mild aortoiliac atherosclerotic disease. The IVC  is grossly unremarkable. No portal venous gas. Extensive retroperitoneal adenopathy encasing the abdominal aorta and iliac arteries. There is diffuse mesenteric and omental implants and caking. Large soft tissue mass in the mesentery encasing the SMA measures approximately 11 x 17 cm in axial dimensions and 15 cm in craniocaudal length.  Reproductive: The prostate is grossly unremarkable. Other: Diffuse subcutaneous edema. Small fat containing umbilical hernia. Musculoskeletal: No acute or significant osseous findings. Review of the MIP images confirms the above findings. IMPRESSION: 1. No CT evidence of central pulmonary artery embolus. 2. Small to moderate bilateral pleural effusions with associated partial compressive atelectasis of the lower lobes. Pneumonia is not excluded clinical correlation is recommended. 3. Extensive retroperitoneal, mesenteric, and omental adenopathy in keeping with history of lymphoma. Enlarged lymph nodes in the anterior cardiophrenic space. 4. Moderate ascites. 5. Mild left hydronephrosis. 6. Aortic Atherosclerosis (ICD10-I70.0). Electronically Signed   By: Anner Crete M.D.   On: 06/24/2020 16:02   NM PET Image Initial (PI) Skull Base To Thigh  Result Date: 06/21/2020 CLINICAL DATA:  Initial treatment strategy for non-Hodgkin lymphoma. EXAM: NUCLEAR MEDICINE PET SKULL BASE TO THIGH TECHNIQUE: 9.5 mCi F-18 FDG was injected intravenously. Full-ring PET imaging was performed from the skull base to thigh after the radiotracer. CT data was obtained and used for attenuation correction and anatomic localization. Fasting blood glucose: 90 mg/dl COMPARISON:  CT abdomen pelvis 06/05/2020, coronary CT 01/28/2020 and CT chest 11/17/2017. FINDINGS: Mediastinal blood pool activity: SUV max 0.8 Liver activity: SUV max 3.2 NECK: Mild hypermetabolic lymph nodes in the neck are seen with an index left level 2 lymph node measuring approximately 6 mm (4/27) and fall SUV max of 4.0. Incidental CT findings: None. CHEST: Hypermetabolic low internal jugular mediastinal, internal mammary and axillary lymph nodes. Index low left paratracheal lymph measures 8 mm (4/64) with an SUV max of 6.8. Clustered prepericardiac lymph nodes measure up to 12 mm (4/90) with an SUV max of 10.0. No hypermetabolic pulmonary nodules. Incidental CT  findings: Coronary artery calcification. Heart is mildly enlarged. No pericardial effusion. Moderate bilateral pleural effusions with compressive atelectasis in both lower lobes. ABDOMEN/PELVIS: Hypermetabolic adenopathy is seen throughout the abdominal peritoneal ligaments, retroperitoneum and mesenteries. Conglomerate nodal mesenteric mass is difficult to differentiate from small bowel, which may be involved, with an SUV max of 14.4, measuring approximately 8.6 x 12.6 cm (4/137). Index gastrohepatic ligament lymph node measures approximately 2.3 cm (4/110) with an SUV max of 11.4. Extensive hypermetabolic peritoneal/omental thickening/caking, with an index 1.9 x 3.6 cm left omental nodule (4/121), SUV max 11.8. Inguinal lymph nodes measure up to 1.5 cm on the left (4/202) with an SUV max of 1.7. No abnormal hypermetabolism in the liver, adrenal glands, spleen or pancreas. Incidental CT findings: Visualized portion of the liver is grossly unremarkable. There may be sludge in the gallbladder. Adrenal glands and right kidney are grossly unremarkable. There may be a punctate stone in the lower pole left kidney. Left pelvicaliceal fullness versus mild hydronephrosis. Spleen is grossly unremarkable. Bladder appears thick-walled but is under distended. Ascites. SKELETON: No abnormal osseous hypermetabolism. Incidental CT findings: Degenerative changes in the spine. IMPRESSION: 1. Hypermetabolic adenopathy throughout the neck, chest, abdomen and pelvis. Findings are worst in the abdomen, with extensive peritoneal carcinomatosis. Small bowel involvement is not excluded. Findings are consistent with the provided history of lymphoma, [place] 5. 2. Moderate bilateral pleural effusions with compressive atelectasis in both lower lobes. 3. Left pelvicaliceal fullness versus mild hydronephrosis. 4. Question punctate left renal  stone. Electronically Signed   By: Lorin Picket M.D.   On: 06/21/2020 09:04   DG Chest Port 1  View  Result Date: 06/24/2020 CLINICAL DATA:  Abdominal pain and swelling.  History of lymphoma. EXAM: PORTABLE CHEST 1 VIEW COMPARISON:  Chest radiograph 11/17/2017 FINDINGS: Normal cardiac and mediastinal contours. Small bilateral pleural effusions with underlying consolidation. No pleural effusion or pneumothorax. Osseous structures unremarkable. IMPRESSION: Small bilateral pleural effusions with underlying consolidation. Electronically Signed   By: Lovey Newcomer M.D.   On: 06/24/2020 14:11   ECHOCARDIOGRAM COMPLETE  Result Date: 06/25/2020    ECHOCARDIOGRAM REPORT   Patient Name:   George Proctor Date of Exam: 06/25/2020 Medical Rec #:  [id number]      Height:       73.0 in Accession #:    [id number]     Weight:       189.0 lb Date of Birth:  02/17/1956      BSA:          2.101 m Patient Age:    53 years       BP:           136/92 mmHg Patient Gender: M              HR:           120 bpm. Exam Location:  Inpatient Procedure: 2D Echo Indications:    786.09 dyspnea  History:        Patient has prior history of Echocardiogram examinations, most                 recent 08/11/2019. Lymphoma.  Sonographer:    Jannett Celestine RDCS (AE) Referring Phys: [phone] [facility]  Sonographer Comments: Suboptimal parasternal window and suboptimal subcostal window. IMPRESSIONS  1. Left ventricular ejection fraction, by estimation, is 65 to 70%. The left ventricle has normal function. The left ventricle has no regional wall motion abnormalities. Left ventricular diastolic parameters are consistent with Grade I diastolic dysfunction (impaired relaxation).  2. Right ventricular systolic function is hyperdynamic. The right ventricular size is normal.  3. The mitral valve is normal in structure. No evidence of mitral valve regurgitation. No evidence of mitral stenosis.  4. The aortic valve is normal in structure. Aortic valve regurgitation is not visualized. No aortic stenosis is present. FINDINGS  Left Ventricle: Left  ventricular ejection fraction, by estimation, is 65 to 70%. The left ventricle has normal function. The left ventricle has no regional wall motion abnormalities. The left ventricular internal cavity size was normal in size. There is  no left ventricular hypertrophy. Left ventricular diastolic parameters are consistent with Grade I diastolic dysfunction (impaired relaxation). Right Ventricle: The right ventricular size is normal. No increase in right ventricular wall thickness. Right ventricular systolic function is hyperdynamic. Left Atrium: Left atrial size was normal in size. Right Atrium: Right atrial size was normal in size. Pericardium: There is no evidence of pericardial effusion. Mitral Valve: The mitral valve is normal in structure. No evidence of mitral valve regurgitation. No evidence of mitral valve stenosis. Tricuspid Valve: The tricuspid valve is normal in structure. Tricuspid valve regurgitation is trivial. No evidence of tricuspid stenosis. Aortic Valve: The aortic valve is normal in structure. Aortic valve regurgitation is not visualized. No aortic stenosis is present. Pulmonic Valve: The pulmonic valve was normal in structure. Pulmonic valve regurgitation is not visualized. No evidence of pulmonic stenosis. Aorta: The aortic root is normal in size and structure. Venous: The inferior  vena cava was not well visualized. IAS/Shunts: No atrial level shunt detected by color flow Doppler.  LEFT VENTRICLE PLAX 2D LVOT diam:     2.50 cm LV SV:         72 LV SV Index:   34 LVOT Area:     4.91 cm  RIGHT VENTRICLE RV S prime:     18.00 cm/s TAPSE (M-mode): 1.8 cm LEFT ATRIUM             Index       RIGHT ATRIUM           Index LA Vol (A2C):   60.4 ml 28.75 ml/m RA Area:     15.80 cm LA Vol (A4C):   42.5 ml 20.23 ml/m RA Volume:   32.40 ml  15.42 ml/m LA Biplane Vol: 54.0 ml 25.71 ml/m  AORTIC VALVE LVOT Vmax:   95.30 cm/s LVOT Vmean:  74.500 cm/s LVOT VTI:    0.146 m MITRAL VALVE               TRICUSPID  VALVE MV Area (PHT): 3.31 cm    TR Peak grad:   13.7 mmHg MV Decel Time: 229 msec    TR Vmax:        185.00 cm/s MV E velocity: 69.40 cm/s MV A velocity: 87.00 cm/s  SHUNTS MV E/A ratio:  0.80        Systemic VTI:  0.15 m                            Systemic Diam: 2.50 cm Skeet Latch MD Electronically signed by Skeet Latch MD Signature Date/Time: 06/25/2020/4:06:39 PM    Final     Labs:  CBC: Recent Labs    06/15/20 1537 06/24/20 1345 06/25/20 0305 06/26/20 0326  WBC 5.4 5.4 4.5 5.1  HGB 14.4 14.8 14.0 14.1  HCT 42.9 44.4 42.5 43.9  PLT 239 303 278 297    COAGS: Recent Labs    06/24/20 1345 06/25/20 0305  INR 1.0 1.1  APTT 32  --     BMP: Recent Labs    06/15/20 1537 06/24/20 1345 06/25/20 0305 06/26/20 0326  NA 138 135 134* 134*  K 4.3 4.2 4.5 4.0  CL 100 99 99 96*  CO2 26 23 24 25   GLUCOSE 82 91 91 93  BUN 10 15 13 13   CALCIUM 9.3 9.1 8.7* 8.9  CREATININE 1.06 1.15 1.05 1.17  GFRNONAA >60 >60 >60 >60  GFRAA >60  --   --   --     LIVER FUNCTION TESTS: Recent Labs    06/15/20 1537 06/24/20 1345 06/25/20 0305  BILITOT 0.5 0.9 0.8  AST 87* 97* 88*  ALT 17 30 29   ALKPHOS 153* 126 113  PROT 6.1* 6.3* 5.8*  ALBUMIN 3.5 3.5 3.3*    TUMOR MARKERS: No results for input(s): AFPTM, CEA, CA199, CHROMGRNA in the last 8760 hours.  Assessment and Plan: 64 year old white male with history of anxiety/depression and recent symptoms of abdominal pain, dyspnea with exertion, nausea ,weight loss, and night sweats.  Subsequent imaging has revealed hypermetabolic adenopathy throughout the neck, chest, abdomen and pelvis with extensive peritoneal carcinomatosis concerning for lymphoma.  He also has small to moderate bilateral pleural effusions, mild left hydronephrosis and ascites.  He has no prior history of cancer.  He was originally scheduled for outpatient lymph node biopsy at [facility] today,however was admitted to  [facility] over the  weekend with worsening abdominal pain and dyspnea.  He is currently afebrile, WBC normal, PT/INR normal, creatinine normal.  Lactic acid 2.2.  COVID-19 negative.  Latest imaging studies were reviewed by Dr. Pascal Lux.  Plan at this time is to proceed with image guided inguinal lymph node biopsy and possible paracentesis.  Details/risks of procedures, including but not limited to, internal bleeding, infection, injury to adjacent structures discussed with patient with his understanding and consent.  Procedures planned for today.    Thank you for this interesting consult.  I greatly enjoyed meeting George Proctor and look forward to participating in their care.  A copy of this report was sent to the requesting provider on this date.  Electronically Signed: D. Rowe Robert, PA-C 06/26/2020, 9:08 AM  I spent a total of 25 minutes    in face to face in clinical consultation, greater than 50% of which was counseling/coordinating care for image guided inguinal lymph node biopsy and possible paracentesis.

## 2020-06-26 NOTE — Progress Notes (Signed)
[facility]   HEMATOLOGY/ONCOLOGY INPATIENT PROGRESS NOTE  Date of Service: 06/26/2020  Inpatient Attending: .Pokhrel, Corrie Mckusick, MD   SUBJECTIVE  George Proctor was admitted on 06/24/2020 with abdominal pain abdominal distention and shortness of breath and was noted to have some mild hypoxia.  Work-up ruled out a PE and acute coronary artery disease.  Echocardiogram has shown normal ejection fraction. He had a PET scan as outpatient on 06/21/2020 as a part of his initial work-up for suspected lymphoma which shows hypermetabolic adenopathy throughout the neck chest abdomen and pelvis with most prominent findings in the abdomen with extensive peritoneal carcinomatosis.  He also has developed some moderate bilateral pleural effusions. Patient had his scheduled inguinal lymph node biopsy and abdominal paracentesis with removal of 2 L of chylous appearing fluid. Patient notes that his breathing is much improved and his abdomen is less uncomfortable and less distended after the paracentesis. We discussed initial ascitic fluid shows lymphocytic predominance along with chylous effusion suggests lymphatic obstruction likely due to lymphoma.  No other overt primary tumor noted on PET CT scan. We discussed starting him on high-dose steroids pending final biopsy results.  OBJECTIVE:  NAD  PHYSICAL EXAMINATION: . Vitals:   06/26/20 1140 06/26/20 1204 06/26/20 1300 06/26/20 1335  BP: (!) 139/91 136/81 135/84 132/82  Pulse: (!) 105 (!) 103 100 99  Resp: (!) 21 16 18 18   Temp:  98.1 F (36.7 C) 97.9 F (36.6 C) 97.9 F (36.6 C)  TempSrc:  Oral Oral Oral  SpO2: 95% 95% 92% 99%  Weight:      Height:       Filed Weights   06/24/20 1226  Weight: 189 lb (85.7 kg)   .Body mass index is 24.94 kg/m.  GENERAL:alert, in no acute distress and comfortable SKIN: skin color, texture, turgor are normal, no rashes or significant lesions EYES: normal, conjunctiva are pink and non-injected, sclera clear OROPHARYNX:no  exudate, no erythema and lips, buccal mucosa, and tongue normal  NECK: supple, no JVD, thyroid normal size, non-tender, without nodularity LYMPH:  no palpable lymphadenopathy in the cervical, axillary or inguinal LUNGS: clear to auscultation with normal respiratory effort HEART: regular rate & rhythm,  no murmurs and no lower extremity edema ABDOMEN: abdomen distended with inverted umbilicus.  Mild tenderness to palpation over central abdomen. Musculoskeletal: Bilateral 2+ pedal edema PSYCH: alert & oriented x 3 with fluent speech NEURO: no focal motor/sensory deficits  MEDICAL HISTORY:  Past Medical History:  Diagnosis Date  . Anxiety   . Depression   . Gait difficulty   . Lymphoma ([place])   . Memory loss     SURGICAL HISTORY: Past Surgical History:  Procedure Laterality Date  . ABDOMINAL SURGERY     Childhood  . BUNIONECTOMY    . IR PARACENTESIS  06/26/2020  . IR US GUIDE BX ASP/DRAIN  06/26/2020    SOCIAL HISTORY: Social History   Socioeconomic History  . Marital status: Married    Spouse name: Not on file  . Number of children: 0  . Years of education: Veterinary surgeon  . Highest education level: Not on file  Occupational History  . Occupation: Scientist, research (physical sciences)  Tobacco Use  . Smoking status: Never Smoker  . Smokeless tobacco: Never Used  Vaping Use  . Vaping Use: Never used  Substance and Sexual Activity  . Alcohol use: No    Alcohol/week: 0.0 standard drinks    Comment: Less than one drink per week.  . Drug use: No  .  Sexual activity: Not Currently  Other Topics Concern  . Not on file  Social History Narrative   Lives at home with wife.   Right-handed.   3 cups caffeine per day.   Social Determinants of Health   Financial Resource Strain:   . Difficulty of Paying Living Expenses: Not on file  Food Insecurity:   . Worried About Charity fundraiser in the Last Year: Not on file  . Ran Out of Food in the Last Year: Not on file    Transportation Needs:   . Lack of Transportation (Medical): Not on file  . Lack of Transportation (Non-Medical): Not on file  Physical Activity:   . Days of Exercise per Week: Not on file  . Minutes of Exercise per Session: Not on file  Stress:   . Feeling of Stress : Not on file  Social Connections:   . Frequency of Communication with Friends and Family: Not on file  . Frequency of Social Gatherings with Friends and Family: Not on file  . Attends Religious Services: Not on file  . Active Member of Clubs or Organizations: Not on file  . Attends Archivist Meetings: Not on file  . Marital Status: Not on file  Intimate Partner Violence:   . Fear of Current or Ex-Partner: Not on file  . Emotionally Abused: Not on file  . Physically Abused: Not on file  . Sexually Abused: Not on file    FAMILY HISTORY: Family History  Problem Relation Age of Onset  . Aneurysm Mother 13       Thoracic  . Hypertension Father   . Heart failure Father   . Heart disease Father   . Heart attack Paternal Grandfather   . Depression Cousin   . Bipolar disorder Cousin   . Schizophrenia Maternal Uncle     ALLERGIES:  is allergic to omeprazole.  MEDICATIONS:  Scheduled Meds: . [START ON 06/27/2020] enoxaparin (LOVENOX) injection  40 mg Subcutaneous Q24H  . lidocaine      . pantoprazole  40 mg Oral Daily  . senna  1 tablet Oral BID   Continuous Infusions: PRN Meds:.acetaminophen **OR** acetaminophen, albuterol, ALPRAZolam, alum & mag hydroxide-simeth, bisacodyl, HYDROmorphone (DILAUDID) injection, ondansetron (ZOFRAN) IV, oxyCODONE-acetaminophen, polyethylene glycol  REVIEW OF SYSTEMS:    10 Point review of Systems was done is negative except as noted above.   LABORATORY DATA:  I have reviewed the data as listed  . CBC Latest Ref Rng & Units 06/26/2020 06/25/2020 06/24/2020  WBC 4.0 - 10.5 K/uL 5.1 4.5 5.4  Hemoglobin 13.0 - 17.0 g/dL 14.1 14.0 14.8  Hematocrit 39 - 52 % 43.9  42.5 44.4  Platelets 150 - 400 K/uL 297 278 303    . CMP Latest Ref Rng & Units 06/26/2020 06/25/2020 06/24/2020  Glucose 70 - 99 mg/dL 93 91 91  BUN 8 - 23 mg/dL 13 13 15   Creatinine 0.61 - 1.24 mg/dL 1.17 1.05 1.15  Sodium 135 - 145 mmol/L 134(L) 134(L) 135  Potassium 3.5 - 5.1 mmol/L 4.0 4.5 4.2  Chloride 98 - 111 mmol/L 96(L) 99 99  CO2 22 - 32 mmol/L 25 24 23   Calcium 8.9 - 10.3 mg/dL 8.9 8.7(L) 9.1  Total Protein 6.5 - 8.1 g/dL - 5.8(L) 6.3(L)  Total Bilirubin 0.3 - 1.2 mg/dL - 0.8 0.9  Alkaline Phos 38 - 126 U/L - 113 126  AST 15 - 41 U/L - 88(H) 97(H)  ALT 0 - 44 U/L - 29  30     RADIOGRAPHIC STUDIES: I have personally reviewed the radiological images as listed and agreed with the findings in the report. CT Angio Chest PE W and/or Wo Contrast  Result Date: 06/24/2020 CLINICAL DATA:  64 year old male with recent diagnosis of lymphoma presenting with shortness of breath. Concern for pulmonary embolism. EXAM: CT ANGIOGRAPHY CHEST CT ABDOMEN AND PELVIS WITH CONTRAST TECHNIQUE: Multidetector CT imaging of the chest was performed using the standard protocol during bolus administration of intravenous contrast. Multiplanar CT image reconstructions and MIPs were obtained to evaluate the vascular anatomy. Multidetector CT imaging of the abdomen and pelvis was performed using the standard protocol during bolus administration of intravenous contrast. CONTRAST:  133mL OMNIPAQUE IOHEXOL 350 MG/ML SOLN COMPARISON:  Chest radiograph dated 06/24/2020. FINDINGS: Evaluation of this exam is limited due to respiratory motion artifact. CTA CHEST FINDINGS Cardiovascular: There is no cardiomegaly or pericardial effusion. The thoracic aorta is unremarkable. Evaluation of the pulmonary arteries is limited due to respiratory motion artifact and suboptimal opacification and timing of the contrast. No large or central pulmonary artery embolus identified. Mediastinum/Nodes: There is no hilar adenopathy. Enlarged  lymph nodes noted anterior to the heart and in the anterior cardiophrenic space. The esophagus and the thyroid gland are grossly unremarkable. No mediastinal fluid collection. Lungs/Pleura: Small to moderate bilateral pleural effusions with associated partial compressive atelectasis the lower lobes. Pneumonia is not excluded clinical correlation is recommended. There is no pneumothorax. The central airways are patent. Musculoskeletal: No chest wall abnormality. No acute or significant osseous findings. Review of the MIP images confirms the above findings. CT ABDOMEN and PELVIS FINDINGS No intra-abdominal free air. There is moderate ascites. Hepatobiliary: The liver is unremarkable. No intrahepatic biliary ductal dilatation. The gallbladder is unremarkable. Pancreas: The pancreas is suboptimally visualized but grossly unremarkable. Spleen: Normal in size without focal abnormality. Adrenals/Urinary Tract: The adrenal glands unremarkable. There is mild left hydronephrosis. There is delayed excretion of contrast by left kidney. The right kidney is unremarkable. The urinary bladder is grossly unremarkable. Stomach/Bowel: There is moderate stool throughout the colon. There is no bowel obstruction. The appendix is not visualized with certainty. No inflammatory changes identified in the right lower quadrant. Vascular/Lymphatic: Mild aortoiliac atherosclerotic disease. The IVC is grossly unremarkable. No portal venous gas. Extensive retroperitoneal adenopathy encasing the abdominal aorta and iliac arteries. There is diffuse mesenteric and omental implants and caking. Large soft tissue mass in the mesentery encasing the SMA measures approximately 11 x 17 cm in axial dimensions and 15 cm in craniocaudal length. Reproductive: The prostate is grossly unremarkable. Other: Diffuse subcutaneous edema. Small fat containing umbilical hernia. Musculoskeletal: No acute or significant osseous findings. Review of the MIP images confirms  the above findings. IMPRESSION: 1. No CT evidence of central pulmonary artery embolus. 2. Small to moderate bilateral pleural effusions with associated partial compressive atelectasis of the lower lobes. Pneumonia is not excluded clinical correlation is recommended. 3. Extensive retroperitoneal, mesenteric, and omental adenopathy in keeping with history of lymphoma. Enlarged lymph nodes in the anterior cardiophrenic space. 4. Moderate ascites. 5. Mild left hydronephrosis. 6. Aortic Atherosclerosis (ICD10-I70.0). Electronically Signed   By: Anner Crete M.D.   On: 06/24/2020 16:02   CT ABDOMEN PELVIS W CONTRAST  Result Date: 06/24/2020 CLINICAL DATA:  64 year old male with recent diagnosis of lymphoma presenting with shortness of breath. Concern for pulmonary embolism. EXAM: CT ANGIOGRAPHY CHEST CT ABDOMEN AND PELVIS WITH CONTRAST TECHNIQUE: Multidetector CT imaging of the chest was performed using the standard protocol during  bolus administration of intravenous contrast. Multiplanar CT image reconstructions and MIPs were obtained to evaluate the vascular anatomy. Multidetector CT imaging of the abdomen and pelvis was performed using the standard protocol during bolus administration of intravenous contrast. CONTRAST:  163mL OMNIPAQUE IOHEXOL 350 MG/ML SOLN COMPARISON:  Chest radiograph dated 06/24/2020. FINDINGS: Evaluation of this exam is limited due to respiratory motion artifact. CTA CHEST FINDINGS Cardiovascular: There is no cardiomegaly or pericardial effusion. The thoracic aorta is unremarkable. Evaluation of the pulmonary arteries is limited due to respiratory motion artifact and suboptimal opacification and timing of the contrast. No large or central pulmonary artery embolus identified. Mediastinum/Nodes: There is no hilar adenopathy. Enlarged lymph nodes noted anterior to the heart and in the anterior cardiophrenic space. The esophagus and the thyroid gland are grossly unremarkable. No mediastinal  fluid collection. Lungs/Pleura: Small to moderate bilateral pleural effusions with associated partial compressive atelectasis the lower lobes. Pneumonia is not excluded clinical correlation is recommended. There is no pneumothorax. The central airways are patent. Musculoskeletal: No chest wall abnormality. No acute or significant osseous findings. Review of the MIP images confirms the above findings. CT ABDOMEN and PELVIS FINDINGS No intra-abdominal free air. There is moderate ascites. Hepatobiliary: The liver is unremarkable. No intrahepatic biliary ductal dilatation. The gallbladder is unremarkable. Pancreas: The pancreas is suboptimally visualized but grossly unremarkable. Spleen: Normal in size without focal abnormality. Adrenals/Urinary Tract: The adrenal glands unremarkable. There is mild left hydronephrosis. There is delayed excretion of contrast by left kidney. The right kidney is unremarkable. The urinary bladder is grossly unremarkable. Stomach/Bowel: There is moderate stool throughout the colon. There is no bowel obstruction. The appendix is not visualized with certainty. No inflammatory changes identified in the right lower quadrant. Vascular/Lymphatic: Mild aortoiliac atherosclerotic disease. The IVC is grossly unremarkable. No portal venous gas. Extensive retroperitoneal adenopathy encasing the abdominal aorta and iliac arteries. There is diffuse mesenteric and omental implants and caking. Large soft tissue mass in the mesentery encasing the SMA measures approximately 11 x 17 cm in axial dimensions and 15 cm in craniocaudal length. Reproductive: The prostate is grossly unremarkable. Other: Diffuse subcutaneous edema. Small fat containing umbilical hernia. Musculoskeletal: No acute or significant osseous findings. Review of the MIP images confirms the above findings. IMPRESSION: 1. No CT evidence of central pulmonary artery embolus. 2. Small to moderate bilateral pleural effusions with associated  partial compressive atelectasis of the lower lobes. Pneumonia is not excluded clinical correlation is recommended. 3. Extensive retroperitoneal, mesenteric, and omental adenopathy in keeping with history of lymphoma. Enlarged lymph nodes in the anterior cardiophrenic space. 4. Moderate ascites. 5. Mild left hydronephrosis. 6. Aortic Atherosclerosis (ICD10-I70.0). Electronically Signed   By: Anner Crete M.D.   On: 06/24/2020 16:02   NM PET Image Initial (PI) Skull Base To Thigh  Result Date: 06/21/2020 CLINICAL DATA:  Initial treatment strategy for non-Hodgkin lymphoma. EXAM: NUCLEAR MEDICINE PET SKULL BASE TO THIGH TECHNIQUE: 9.5 mCi F-18 FDG was injected intravenously. Full-ring PET imaging was performed from the skull base to thigh after the radiotracer. CT data was obtained and used for attenuation correction and anatomic localization. Fasting blood glucose: 90 mg/dl COMPARISON:  CT abdomen pelvis 06/05/2020, coronary CT 01/28/2020 and CT chest 11/17/2017. FINDINGS: Mediastinal blood pool activity: SUV max 0.8 Liver activity: SUV max 3.2 NECK: Mild hypermetabolic lymph nodes in the neck are seen with an index left level 2 lymph node measuring approximately 6 mm (4/27) and fall SUV max of 4.0. Incidental CT findings: None. CHEST: Hypermetabolic low  internal jugular mediastinal, internal mammary and axillary lymph nodes. Index low left paratracheal lymph measures 8 mm (4/64) with an SUV max of 6.8. Clustered prepericardiac lymph nodes measure up to 12 mm (4/90) with an SUV max of 10.0. No hypermetabolic pulmonary nodules. Incidental CT findings: Coronary artery calcification. Heart is mildly enlarged. No pericardial effusion. Moderate bilateral pleural effusions with compressive atelectasis in both lower lobes. ABDOMEN/PELVIS: Hypermetabolic adenopathy is seen throughout the abdominal peritoneal ligaments, retroperitoneum and mesenteries. Conglomerate nodal mesenteric mass is difficult to differentiate  from small bowel, which may be involved, with an SUV max of 14.4, measuring approximately 8.6 x 12.6 cm (4/137). Index gastrohepatic ligament lymph node measures approximately 2.3 cm (4/110) with an SUV max of 11.4. Extensive hypermetabolic peritoneal/omental thickening/caking, with an index 1.9 x 3.6 cm left omental nodule (4/121), SUV max 11.8. Inguinal lymph nodes measure up to 1.5 cm on the left (4/202) with an SUV max of 1.7. No abnormal hypermetabolism in the liver, adrenal glands, spleen or pancreas. Incidental CT findings: Visualized portion of the liver is grossly unremarkable. There may be sludge in the gallbladder. Adrenal glands and right kidney are grossly unremarkable. There may be a punctate stone in the lower pole left kidney. Left pelvicaliceal fullness versus mild hydronephrosis. Spleen is grossly unremarkable. Bladder appears thick-walled but is under distended. Ascites. SKELETON: No abnormal osseous hypermetabolism. Incidental CT findings: Degenerative changes in the spine. IMPRESSION: 1. Hypermetabolic adenopathy throughout the neck, chest, abdomen and pelvis. Findings are worst in the abdomen, with extensive peritoneal carcinomatosis. Small bowel involvement is not excluded. Findings are consistent with the provided history of lymphoma, [place] 5. 2. Moderate bilateral pleural effusions with compressive atelectasis in both lower lobes. 3. Left pelvicaliceal fullness versus mild hydronephrosis. 4. Question punctate left renal stone. Electronically Signed   By: Lorin Picket M.D.   On: 06/21/2020 09:04   IR US Guide Bx Asp/Drain  Result Date: 06/26/2020 INDICATION: Concern for lymphoma. Please perform ultrasound-guided biopsy of hypermetabolic inguinal lymph node for tissue diagnostic purposes Additionally, patient currently admitted with abdominal pain and distension and as such request made for ascites search ultrasound ultrasound-guided paracentesis for diagnostic and therapeutic  purposes as indicated. EXAM: 1. ULTRASOUND-GUIDED RIGHT INGUINAL LYMPH NODE BIOPSY 2. ULTRASOUND-GUIDED PARACENTESIS COMPARISON:  CT of the chest, abdomen and pelvis- 06/24/2020; PET-CT-06/21/2020 MEDICATIONS: None. COMPLICATIONS: None immediate. TECHNIQUE: Informed written consent was obtained from the patient after a discussion of the risks, benefits and alternatives to treatment. A timeout was performed prior to the initiation of the procedure. Initial ultrasound scanning demonstrates a small amount of intra-abdominal ascites with dominant pocket located within right upper abdomen adjacent to the hepatic parenchyma. As such, the skin overlying the right upper abdomen was subsequently prepped and draped in the usual sterile fashion. 1% lidocaine with epinephrine was used for local anesthesia. Under direct ultrasound guidance, an 8 Fr Safe-T-Centesis catheter was introduced. The paracentesis was performed. A small amount of aspirated fluid was capped and sent to the laboratory for cytologic analysis as well as triglyceride levels. The catheter was removed and a dressing was applied. Next, attention was paid towards ultrasound-guided biopsy of known hypermetabolic right inguinal lymphadenopathy. Sonographic evaluation of the right groin demonstrates an at least 2.4 x 0.8 cm right inguinal lymph node correlating with the dominant right inguinal lymph node seen on preceding abdominal CT image 90, series 2. The procedure was planned. The right groin was prepped and draped in usual sterile fashion. After the overlying soft tissues anesthetized 1% lidocaine  with epinephrine, an 18 gauge core needle biopsy device was utilized to obtain 6 core needle biopsy samples. Multiple ultrasound images were saved procedural documentation purposes. Samples were placed in saline and submitted to the laboratory for pathologic analysis. The patient tolerated the above procedures well without immediate postprocedural complication.  FINDINGS: A total of approximately 2 liters of chylous fluid was removed. Samples were sent to the laboratory as requested by the clinical team. Successful ultrasound-guided biopsy dominant right inguinal lymph node IMPRESSION: 1. Successful ultrasound-guided biopsy of dominant right inguinal node. 2. Successful ultrasound-guided paracentesis yielding 2 liters of chylous appearing fluid. Fluid was sent to the laboratory for cytologic analysis as well as triglyceride levels. Electronically Signed   By: Sandi Mariscal M.D.   On: 06/26/2020 13:16   DG Chest Port 1 View  Result Date: 06/24/2020 CLINICAL DATA:  Abdominal pain and swelling.  History of lymphoma. EXAM: PORTABLE CHEST 1 VIEW COMPARISON:  Chest radiograph 11/17/2017 FINDINGS: Normal cardiac and mediastinal contours. Small bilateral pleural effusions with underlying consolidation. No pleural effusion or pneumothorax. Osseous structures unremarkable. IMPRESSION: Small bilateral pleural effusions with underlying consolidation. Electronically Signed   By: Lovey Newcomer M.D.   On: 06/24/2020 14:11   ECHOCARDIOGRAM COMPLETE  Result Date: 06/25/2020    ECHOCARDIOGRAM REPORT   Patient Name:   George Proctor Date of Exam: 06/25/2020 Medical Rec #:  [id number]      Height:       73.0 in Accession #:    [id number]     Weight:       189.0 lb Date of Birth:  12/25/1955      BSA:          2.101 m Patient Age:    26 years       BP:           136/92 mmHg Patient Gender: M              HR:           120 bpm. Exam Location:  Inpatient Procedure: 2D Echo Indications:    786.09 dyspnea  History:        Patient has prior history of Echocardiogram examinations, most                 recent 08/11/2019. Lymphoma.  Sonographer:    Jannett Celestine RDCS (AE) Referring Phys: [phone] [facility] POKHREL  Sonographer Comments: Suboptimal parasternal window and suboptimal subcostal window. IMPRESSIONS  1. Left ventricular ejection fraction, by estimation, is 65 to 70%. The left ventricle has  normal function. The left ventricle has no regional wall motion abnormalities. Left ventricular diastolic parameters are consistent with Grade I diastolic dysfunction (impaired relaxation).  2. Right ventricular systolic function is hyperdynamic. The right ventricular size is normal.  3. The mitral valve is normal in structure. No evidence of mitral valve regurgitation. No evidence of mitral stenosis.  4. The aortic valve is normal in structure. Aortic valve regurgitation is not visualized. No aortic stenosis is present. FINDINGS  Left Ventricle: Left ventricular ejection fraction, by estimation, is 65 to 70%. The left ventricle has normal function. The left ventricle has no regional wall motion abnormalities. The left ventricular internal cavity size was normal in size. There is  no left ventricular hypertrophy. Left ventricular diastolic parameters are consistent with Grade I diastolic dysfunction (impaired relaxation). Right Ventricle: The right ventricular size is normal. No increase in right ventricular wall thickness. Right ventricular systolic function is hyperdynamic. Left Atrium: Left  atrial size was normal in size. Right Atrium: Right atrial size was normal in size. Pericardium: There is no evidence of pericardial effusion. Mitral Valve: The mitral valve is normal in structure. No evidence of mitral valve regurgitation. No evidence of mitral valve stenosis. Tricuspid Valve: The tricuspid valve is normal in structure. Tricuspid valve regurgitation is trivial. No evidence of tricuspid stenosis. Aortic Valve: The aortic valve is normal in structure. Aortic valve regurgitation is not visualized. No aortic stenosis is present. Pulmonic Valve: The pulmonic valve was normal in structure. Pulmonic valve regurgitation is not visualized. No evidence of pulmonic stenosis. Aorta: The aortic root is normal in size and structure. Venous: The inferior vena cava was not well visualized. IAS/Shunts: No atrial level shunt  detected by color flow Doppler.  LEFT VENTRICLE PLAX 2D LVOT diam:     2.50 cm LV SV:         72 LV SV Index:   34 LVOT Area:     4.91 cm  RIGHT VENTRICLE RV S prime:     18.00 cm/s TAPSE (M-mode): 1.8 cm LEFT ATRIUM             Index       RIGHT ATRIUM           Index LA Vol (A2C):   60.4 ml 28.75 ml/m RA Area:     15.80 cm LA Vol (A4C):   42.5 ml 20.23 ml/m RA Volume:   32.40 ml  15.42 ml/m LA Biplane Vol: 54.0 ml 25.71 ml/m  AORTIC VALVE LVOT Vmax:   95.30 cm/s LVOT Vmean:  74.500 cm/s LVOT VTI:    0.146 m MITRAL VALVE               TRICUSPID VALVE MV Area (PHT): 3.31 cm    TR Peak grad:   13.7 mmHg MV Decel Time: 229 msec    TR Vmax:        185.00 cm/s MV E velocity: 69.40 cm/s MV A velocity: 87.00 cm/s  SHUNTS MV E/A ratio:  0.80        Systemic VTI:  0.15 m                            Systemic Diam: 2.50 cm Skeet Latch MD Electronically signed by Skeet Latch MD Signature Date/Time: 06/25/2020/4:06:39 PM    Final    IR Paracentesis  Result Date: 06/26/2020 INDICATION: Concern for lymphoma. Please perform ultrasound-guided biopsy of hypermetabolic inguinal lymph node for tissue diagnostic purposes Additionally, patient currently admitted with abdominal pain and distension and as such request made for ascites search ultrasound ultrasound-guided paracentesis for diagnostic and therapeutic purposes as indicated. EXAM: 1. ULTRASOUND-GUIDED RIGHT INGUINAL LYMPH NODE BIOPSY 2. ULTRASOUND-GUIDED PARACENTESIS COMPARISON:  CT of the chest, abdomen and pelvis- 06/24/2020; PET-CT-06/21/2020 MEDICATIONS: None. COMPLICATIONS: None immediate. TECHNIQUE: Informed written consent was obtained from the patient after a discussion of the risks, benefits and alternatives to treatment. A timeout was performed prior to the initiation of the procedure. Initial ultrasound scanning demonstrates a small amount of intra-abdominal ascites with dominant pocket located within right upper abdomen adjacent to the hepatic  parenchyma. As such, the skin overlying the right upper abdomen was subsequently prepped and draped in the usual sterile fashion. 1% lidocaine with epinephrine was used for local anesthesia. Under direct ultrasound guidance, an 8 Fr Safe-T-Centesis catheter was introduced. The paracentesis was performed. A small amount of aspirated fluid was capped and  sent to the laboratory for cytologic analysis as well as triglyceride levels. The catheter was removed and a dressing was applied. Next, attention was paid towards ultrasound-guided biopsy of known hypermetabolic right inguinal lymphadenopathy. Sonographic evaluation of the right groin demonstrates an at least 2.4 x 0.8 cm right inguinal lymph node correlating with the dominant right inguinal lymph node seen on preceding abdominal CT image 90, series 2. The procedure was planned. The right groin was prepped and draped in usual sterile fashion. After the overlying soft tissues anesthetized 1% lidocaine with epinephrine, an 18 gauge core needle biopsy device was utilized to obtain 6 core needle biopsy samples. Multiple ultrasound images were saved procedural documentation purposes. Samples were placed in saline and submitted to the laboratory for pathologic analysis. The patient tolerated the above procedures well without immediate postprocedural complication. FINDINGS: A total of approximately 2 liters of chylous fluid was removed. Samples were sent to the laboratory as requested by the clinical team. Successful ultrasound-guided biopsy dominant right inguinal lymph node IMPRESSION: 1. Successful ultrasound-guided biopsy of dominant right inguinal node. 2. Successful ultrasound-guided paracentesis yielding 2 liters of chylous appearing fluid. Fluid was sent to the laboratory for cytologic analysis as well as triglyceride levels. Electronically Signed   By: Sandi Mariscal M.D.   On: 06/26/2020 13:16    ASSESSMENT & PLAN:   64 year old gentleman with  #1 extensive  cervical thoracic and primarily abdominal pelvic lymphadenopathy highly suspicious for high-grade lymphoma. #2 abdominal distention and discomfort along with newly noted chylous effusion showing lymphocytic predominance. Patient status post paracentesis with removal of 2 L of fluid with improvement in symptoms. #3 shortness of breath-echo shows normal ejection fraction.  CT chest negative for PE or pneumonia.  Negative enzymes. Plan -Patient has had his inguinal lymph node biopsy today as well as paracentesis with removal of 2 L of chylous effusion with improvement in his abdominal discomfort and shortness of breath. -We discussed the findings of his imaging studies. -We will start the patient on high-dose prednisone 80 mg p.o. daily to help with starting to treat his suspected lymphoma.  Patient is agreeable to this plan pending final biopsy results. -GI prophylaxis with PPI. -We will check uric acid level and start patient on allopurinol for tumor lysis syndrome prophylaxis. -Lorazepam as needed for insomnia or anxiety or tremulousness related to high-dose steroids. -Patient was recommended to the prednisone on a full stomach. -Appreciate the excellent hospital medicine and nursing cares on [address] for 4W -Would likely get biopsy results late tomorrow or day after tomorrow.  Patient will likely need to be in the hospital till then and depending on the results might need his first cycle of treatment as inpatient. -We will follow-up final ascitic fluid results  I spent 30 minutes counseling the patient face to face. The total time spent in the appointment was 40 minutes and more than 50% was on counseling and direct patient cares.    Sullivan Lone MD [place] AAHIVMS [facility] Hematology/Oncology Physician [facility]  (Office):       [phone] (Work cell):  [phone] (Fax):           [phone]  06/26/2020 3:28 PM

## 2020-06-27 ENCOUNTER — Ambulatory Visit (HOSPITAL_COMMUNITY): Admission: RE | Admit: 2020-06-27 | Payer: 59 | Source: Ambulatory Visit

## 2020-06-27 DIAGNOSIS — R0602 Shortness of breath: Secondary | ICD-10-CM | POA: Diagnosis not present

## 2020-06-27 DIAGNOSIS — F419 Anxiety disorder, unspecified: Secondary | ICD-10-CM | POA: Diagnosis not present

## 2020-06-27 DIAGNOSIS — C8598 Non-Hodgkin lymphoma, unspecified, lymph nodes of multiple sites: Secondary | ICD-10-CM | POA: Diagnosis not present

## 2020-06-27 DIAGNOSIS — K219 Gastro-esophageal reflux disease without esophagitis: Secondary | ICD-10-CM | POA: Diagnosis not present

## 2020-06-27 DIAGNOSIS — R109 Unspecified abdominal pain: Secondary | ICD-10-CM | POA: Diagnosis not present

## 2020-06-27 LAB — COMPREHENSIVE METABOLIC PANEL
ALT: 25 U/L (ref 0–44)
AST: 70 U/L — ABNORMAL HIGH (ref 15–41)
Albumin: 3.2 g/dL — ABNORMAL LOW (ref 3.5–5.0)
Alkaline Phosphatase: 105 U/L (ref 38–126)
Anion gap: 11 (ref 5–15)
BUN: 23 mg/dL (ref 8–23)
CO2: 26 mmol/L (ref 22–32)
Calcium: 8.9 mg/dL (ref 8.9–10.3)
Chloride: 98 mmol/L (ref 98–111)
Creatinine, Ser: 1.17 mg/dL (ref 0.61–1.24)
GFR, Estimated: >60 mL/min (ref >60)
Glucose, Bld: 124 mg/dL — ABNORMAL HIGH (ref 70–99)
Potassium: 4.6 mmol/L (ref 3.5–5.1)
Sodium: 135 mmol/L (ref 135–145)
Total Bilirubin: 0.4 mg/dL (ref 0.3–1.2)
Total Protein: 5.7 g/dL — ABNORMAL LOW (ref 6.5–8.1)

## 2020-06-27 LAB — CBC WITH DIFFERENTIAL/PLATELET
Abs Immature Granulocytes: 0.02 10*3/uL (ref 0.00–0.07)
Basophils Absolute: 0 10*3/uL (ref 0.0–0.1)
Basophils Relative: 0 %
Eosinophils Absolute: 0 10*3/uL (ref 0.0–0.5)
Eosinophils Relative: 0 %
HCT: 41.1 % (ref 39.0–52.0)
Hemoglobin: 13.4 g/dL (ref 13.0–17.0)
Immature Granulocytes: 0 %
Lymphocytes Relative: 5 %
Lymphs Abs: 0.2 10*3/uL — ABNORMAL LOW (ref 0.7–4.0)
MCH: 27.9 pg (ref 26.0–34.0)
MCHC: 32.6 g/dL (ref 30.0–36.0)
MCV: 85.4 fL (ref 80.0–100.0)
Monocytes Absolute: 0.4 10*3/uL (ref 0.1–1.0)
Monocytes Relative: 7 %
Neutro Abs: 4.3 10*3/uL (ref 1.7–7.7)
Neutrophils Relative %: 88 %
Platelets: 311 10*3/uL (ref 150–400)
RBC: 4.81 MIL/uL (ref 4.22–5.81)
RDW: 13.1 % (ref 11.5–15.5)
WBC: 4.9 10*3/uL (ref 4.0–10.5)
nRBC: 0 % (ref 0.0–0.2)

## 2020-06-27 LAB — TRIGLYCERIDES, BODY FLUIDS: Triglycerides, Fluid: 309 mg/dL

## 2020-06-27 LAB — LACTATE DEHYDROGENASE: LDH: 639 U/L — ABNORMAL HIGH (ref 98–192)

## 2020-06-27 LAB — CYTOLOGY - NON PAP

## 2020-06-27 LAB — SURGICAL PATHOLOGY

## 2020-06-27 LAB — URIC ACID: Uric Acid, Serum: 10.3 mg/dL — ABNORMAL HIGH (ref 3.7–8.6)

## 2020-06-27 NOTE — Progress Notes (Addendum)
George Proctor   HEMATOLOGY/ONCOLOGY INPATIENT PROGRESS NOTE  Date of Service: 06/27/2020  Inpatient Attending: .Pokhrel, Corrie Mckusick, MD   SUBJECTIVE  George Proctor was admitted on 06/24/2020 with abdominal pain abdominal distention and shortness of breath and was noted to have some mild hypoxia.  Work-up ruled out a PE and acute coronary artery disease.  Echocardiogram has shown normal ejection fraction. He had a PET scan as outpatient on 06/21/2020 as a part of his initial work-up for suspected lymphoma which shows hypermetabolic adenopathy throughout the neck chest abdomen and pelvis with most prominent findings in the abdomen with extensive peritoneal carcinomatosis.  He also has developed some moderate bilateral pleural effusions.  The patient underwent ultrasound-guided biopsy of the right inguinal lymph node as well as an ultrasound-guided paracentesis yielding 2 L of chylous appearing fluid on 06/26/2020.  He had improvement of his breathing abdominal distention following the procedure.  He was started on high-dose prednisone at 80 mg daily following the biopsy.  He was also started on allopurinol due to an elevated uric acid level and concern for tumor lysis syndrome.  OBJECTIVE:  NAD  PHYSICAL EXAMINATION: . Vitals:   06/27/20 0338 06/27/20 0704 06/27/20 0940 06/27/20 1153  BP: 123/84 (!) 135/95 (!) 142/100 127/84  Pulse: (!) 103 (!) 108 (!) 118 (!) 111  Resp: 16 16 18 16   Temp: 98 F (36.7 C) 97.8 F (36.6 C)  97.9 F (36.6 C)  TempSrc: Oral Oral  Oral  SpO2: 92% 94% 92% 94%  Weight:      Height:       Filed Weights   06/24/20 1226  Weight: 85.7 kg   .Body mass index is 24.94 kg/m.  GENERAL:alert, in no acute distress and comfortable SKIN: skin color, texture, turgor are normal, no rashes or significant lesions EYES: normal, conjunctiva are pink and non-injected, sclera clear OROPHARYNX:no exudate, no erythema and lips, buccal mucosa, and tongue normal  NECK: supple, no JVD,  thyroid normal size, non-tender, without nodularity LYMPH:  no palpable lymphadenopathy in the cervical, axillary or inguinal LUNGS: clear to auscultation with normal respiratory effort HEART: regular rate & rhythm,  no murmurs and no lower extremity edema ABDOMEN: abdomen distended with inverted umbilicus.  Mild tenderness to palpation over central abdomen. Musculoskeletal: Bilateral 2+ pedal edema PSYCH: alert & oriented x 3 with fluent speech NEURO: no focal motor/sensory deficits  MEDICAL HISTORY:  Past Medical History:  Diagnosis Date  . Anxiety   . Depression   . Gait difficulty   . Lymphoma ([place])   . Memory loss     SURGICAL HISTORY: Past Surgical History:  Procedure Laterality Date  . ABDOMINAL SURGERY     Childhood  . BUNIONECTOMY    . IR PARACENTESIS  06/26/2020  . IR US GUIDE BX ASP/DRAIN  06/26/2020    SOCIAL HISTORY: Social History   Socioeconomic History  . Marital status: Married    Spouse name: Not on file  . Number of children: 0  . Years of education: Veterinary surgeon  . Highest education level: Not on file  Occupational History  . Occupation: Scientist, research (physical sciences)  Tobacco Use  . Smoking status: Never Smoker  . Smokeless tobacco: Never Used  Vaping Use  . Vaping Use: Never used  Substance and Sexual Activity  . Alcohol use: No    Alcohol/week: 0.0 standard drinks    Comment: Less than one drink per week.  . Drug use: No  . Sexual activity: Not Currently  Other Topics  Concern  . Not on file  Social History Narrative   Lives at home with wife.   Right-handed.   3 cups caffeine per day.   Social Determinants of Health   Financial Resource Strain:   . Difficulty of Paying Living Expenses: Not on file  Food Insecurity:   . Worried About Charity fundraiser in the Last Year: Not on file  . Ran Out of Food in the Last Year: Not on file  Transportation Needs:   . Lack of Transportation (Medical): Not on file  . Lack of  Transportation (Non-Medical): Not on file  Physical Activity:   . Days of Exercise per Week: Not on file  . Minutes of Exercise per Session: Not on file  Stress:   . Feeling of Stress : Not on file  Social Connections:   . Frequency of Communication with Friends and Family: Not on file  . Frequency of Social Gatherings with Friends and Family: Not on file  . Attends Religious Services: Not on file  . Active Member of Clubs or Organizations: Not on file  . Attends Archivist Meetings: Not on file  . Marital Status: Not on file  Intimate Partner Violence:   . Fear of Current or Ex-Partner: Not on file  . Emotionally Abused: Not on file  . Physically Abused: Not on file  . Sexually Abused: Not on file    FAMILY HISTORY: Family History  Problem Relation Age of Onset  . Aneurysm Mother 54       Thoracic  . Hypertension Father   . Heart failure Father   . Heart disease Father   . Heart attack Paternal Grandfather   . Depression Cousin   . Bipolar disorder Cousin   . Schizophrenia Maternal Uncle     ALLERGIES:  is allergic to omeprazole.  MEDICATIONS:  Scheduled Meds: . allopurinol  100 mg Oral BID  . enoxaparin (LOVENOX) injection  40 mg Subcutaneous Q24H  . pantoprazole  40 mg Oral Daily  . predniSONE  80 mg Oral Q breakfast  . senna  1 tablet Oral BID   Continuous Infusions: PRN Meds:.acetaminophen **OR** acetaminophen, albuterol, alum & mag hydroxide-simeth, bisacodyl, HYDROmorphone (DILAUDID) injection, LORazepam, ondansetron (ZOFRAN) IV, oxyCODONE-acetaminophen, polyethylene glycol  REVIEW OF SYSTEMS:    10 Point review of Systems was done is negative except as noted above.   LABORATORY DATA:  I have reviewed the data as listed  . CBC Latest Ref Rng & Units 06/27/2020 06/26/2020 06/25/2020  WBC 4.0 - 10.5 K/uL 4.9 5.1 4.5  Hemoglobin 13.0 - 17.0 g/dL 13.4 14.1 14.0  Hematocrit 39 - 52 % 41.1 43.9 42.5  Platelets 150 - 400 K/uL 311 297 278     . CMP Latest Ref Rng & Units 06/27/2020 06/26/2020 06/25/2020  Glucose 70 - 99 mg/dL 124(H) 93 91  BUN 8 - 23 mg/dL 23 13 13   Creatinine 0.61 - 1.24 mg/dL 1.17 1.17 1.05  Sodium 135 - 145 mmol/L 135 134(L) 134(L)  Potassium 3.5 - 5.1 mmol/L 4.6 4.0 4.5  Chloride 98 - 111 mmol/L 98 96(L) 99  CO2 22 - 32 mmol/L 26 25 24   Calcium 8.9 - 10.3 mg/dL 8.9 8.9 8.7(L)  Total Protein 6.5 - 8.1 g/dL 5.7(L) - 5.8(L)  Total Bilirubin 0.3 - 1.2 mg/dL 0.4 - 0.8  Alkaline Phos 38 - 126 U/L 105 - 113  AST 15 - 41 U/L 70(H) - 88(H)  ALT 0 - 44 U/L 25 - 29  RADIOGRAPHIC STUDIES: I have personally reviewed the radiological images as listed and agreed with the findings in the report. CT Angio Chest PE W and/or Wo Contrast  Result Date: 06/24/2020 CLINICAL DATA:  64 year old male with recent diagnosis of lymphoma presenting with shortness of breath. Concern for pulmonary embolism. EXAM: CT ANGIOGRAPHY CHEST CT ABDOMEN AND PELVIS WITH CONTRAST TECHNIQUE: Multidetector CT imaging of the chest was performed using the standard protocol during bolus administration of intravenous contrast. Multiplanar CT image reconstructions and MIPs were obtained to evaluate the vascular anatomy. Multidetector CT imaging of the abdomen and pelvis was performed using the standard protocol during bolus administration of intravenous contrast. CONTRAST:  178mL OMNIPAQUE IOHEXOL 350 MG/ML SOLN COMPARISON:  Chest radiograph dated 06/24/2020. FINDINGS: Evaluation of this exam is limited due to respiratory motion artifact. CTA CHEST FINDINGS Cardiovascular: There is no cardiomegaly or pericardial effusion. The thoracic aorta is unremarkable. Evaluation of the pulmonary arteries is limited due to respiratory motion artifact and suboptimal opacification and timing of the contrast. No large or central pulmonary artery embolus identified. Mediastinum/Nodes: There is no hilar adenopathy. Enlarged lymph nodes noted anterior to the heart and  in the anterior cardiophrenic space. The esophagus and the thyroid gland are grossly unremarkable. No mediastinal fluid collection. Lungs/Pleura: Small to moderate bilateral pleural effusions with associated partial compressive atelectasis the lower lobes. Pneumonia is not excluded clinical correlation is recommended. There is no pneumothorax. The central airways are patent. Musculoskeletal: No chest wall abnormality. No acute or significant osseous findings. Review of the MIP images confirms the above findings. CT ABDOMEN and PELVIS FINDINGS No intra-abdominal free air. There is moderate ascites. Hepatobiliary: The liver is unremarkable. No intrahepatic biliary ductal dilatation. The gallbladder is unremarkable. Pancreas: The pancreas is suboptimally visualized but grossly unremarkable. Spleen: Normal in size without focal abnormality. Adrenals/Urinary Tract: The adrenal glands unremarkable. There is mild left hydronephrosis. There is delayed excretion of contrast by left kidney. The right kidney is unremarkable. The urinary bladder is grossly unremarkable. Stomach/Bowel: There is moderate stool throughout the colon. There is no bowel obstruction. The appendix is not visualized with certainty. No inflammatory changes identified in the right lower quadrant. Vascular/Lymphatic: Mild aortoiliac atherosclerotic disease. The IVC is grossly unremarkable. No portal venous gas. Extensive retroperitoneal adenopathy encasing the abdominal aorta and iliac arteries. There is diffuse mesenteric and omental implants and caking. Large soft tissue mass in the mesentery encasing the SMA measures approximately 11 x 17 cm in axial dimensions and 15 cm in craniocaudal length. Reproductive: The prostate is grossly unremarkable. Other: Diffuse subcutaneous edema. Small fat containing umbilical hernia. Musculoskeletal: No acute or significant osseous findings. Review of the MIP images confirms the above findings. IMPRESSION: 1. No CT  evidence of central pulmonary artery embolus. 2. Small to moderate bilateral pleural effusions with associated partial compressive atelectasis of the lower lobes. Pneumonia is not excluded clinical correlation is recommended. 3. Extensive retroperitoneal, mesenteric, and omental adenopathy in keeping with history of lymphoma. Enlarged lymph nodes in the anterior cardiophrenic space. 4. Moderate ascites. 5. Mild left hydronephrosis. 6. Aortic Atherosclerosis (ICD10-I70.0). Electronically Signed   By: Anner Crete M.D.   On: 06/24/2020 16:02   CT ABDOMEN PELVIS W CONTRAST  Result Date: 06/24/2020 CLINICAL DATA:  64 year old male with recent diagnosis of lymphoma presenting with shortness of breath. Concern for pulmonary embolism. EXAM: CT ANGIOGRAPHY CHEST CT ABDOMEN AND PELVIS WITH CONTRAST TECHNIQUE: Multidetector CT imaging of the chest was performed using the standard protocol during bolus administration of intravenous contrast.  Multiplanar CT image reconstructions and MIPs were obtained to evaluate the vascular anatomy. Multidetector CT imaging of the abdomen and pelvis was performed using the standard protocol during bolus administration of intravenous contrast. CONTRAST:  169mL OMNIPAQUE IOHEXOL 350 MG/ML SOLN COMPARISON:  Chest radiograph dated 06/24/2020. FINDINGS: Evaluation of this exam is limited due to respiratory motion artifact. CTA CHEST FINDINGS Cardiovascular: There is no cardiomegaly or pericardial effusion. The thoracic aorta is unremarkable. Evaluation of the pulmonary arteries is limited due to respiratory motion artifact and suboptimal opacification and timing of the contrast. No large or central pulmonary artery embolus identified. Mediastinum/Nodes: There is no hilar adenopathy. Enlarged lymph nodes noted anterior to the heart and in the anterior cardiophrenic space. The esophagus and the thyroid gland are grossly unremarkable. No mediastinal fluid collection. Lungs/Pleura: Small to  moderate bilateral pleural effusions with associated partial compressive atelectasis the lower lobes. Pneumonia is not excluded clinical correlation is recommended. There is no pneumothorax. The central airways are patent. Musculoskeletal: No chest wall abnormality. No acute or significant osseous findings. Review of the MIP images confirms the above findings. CT ABDOMEN and PELVIS FINDINGS No intra-abdominal free air. There is moderate ascites. Hepatobiliary: The liver is unremarkable. No intrahepatic biliary ductal dilatation. The gallbladder is unremarkable. Pancreas: The pancreas is suboptimally visualized but grossly unremarkable. Spleen: Normal in size without focal abnormality. Adrenals/Urinary Tract: The adrenal glands unremarkable. There is mild left hydronephrosis. There is delayed excretion of contrast by left kidney. The right kidney is unremarkable. The urinary bladder is grossly unremarkable. Stomach/Bowel: There is moderate stool throughout the colon. There is no bowel obstruction. The appendix is not visualized with certainty. No inflammatory changes identified in the right lower quadrant. Vascular/Lymphatic: Mild aortoiliac atherosclerotic disease. The IVC is grossly unremarkable. No portal venous gas. Extensive retroperitoneal adenopathy encasing the abdominal aorta and iliac arteries. There is diffuse mesenteric and omental implants and caking. Large soft tissue mass in the mesentery encasing the SMA measures approximately 11 x 17 cm in axial dimensions and 15 cm in craniocaudal length. Reproductive: The prostate is grossly unremarkable. Other: Diffuse subcutaneous edema. Small fat containing umbilical hernia. Musculoskeletal: No acute or significant osseous findings. Review of the MIP images confirms the above findings. IMPRESSION: 1. No CT evidence of central pulmonary artery embolus. 2. Small to moderate bilateral pleural effusions with associated partial compressive atelectasis of the lower  lobes. Pneumonia is not excluded clinical correlation is recommended. 3. Extensive retroperitoneal, mesenteric, and omental adenopathy in keeping with history of lymphoma. Enlarged lymph nodes in the anterior cardiophrenic space. 4. Moderate ascites. 5. Mild left hydronephrosis. 6. Aortic Atherosclerosis (ICD10-I70.0). Electronically Signed   By: Anner Crete M.D.   On: 06/24/2020 16:02   NM PET Image Initial (PI) Skull Base To Thigh  Result Date: 06/21/2020 CLINICAL DATA:  Initial treatment strategy for non-Hodgkin lymphoma. EXAM: NUCLEAR MEDICINE PET SKULL BASE TO THIGH TECHNIQUE: 9.5 mCi F-18 FDG was injected intravenously. Full-ring PET imaging was performed from the skull base to thigh after the radiotracer. CT data was obtained and used for attenuation correction and anatomic localization. Fasting blood glucose: 90 mg/dl COMPARISON:  CT abdomen pelvis 06/05/2020, coronary CT 01/28/2020 and CT chest 11/17/2017. FINDINGS: Mediastinal blood pool activity: SUV max 0.8 Liver activity: SUV max 3.2 NECK: Mild hypermetabolic lymph nodes in the neck are seen with an index left level 2 lymph node measuring approximately 6 mm (4/27) and fall SUV max of 4.0. Incidental CT findings: None. CHEST: Hypermetabolic low internal jugular mediastinal, internal mammary  and axillary lymph nodes. Index low left paratracheal lymph measures 8 mm (4/64) with an SUV max of 6.8. Clustered prepericardiac lymph nodes measure up to 12 mm (4/90) with an SUV max of 10.0. No hypermetabolic pulmonary nodules. Incidental CT findings: Coronary artery calcification. Heart is mildly enlarged. No pericardial effusion. Moderate bilateral pleural effusions with compressive atelectasis in both lower lobes. ABDOMEN/PELVIS: Hypermetabolic adenopathy is seen throughout the abdominal peritoneal ligaments, retroperitoneum and mesenteries. Conglomerate nodal mesenteric mass is difficult to differentiate from small bowel, which may be involved, with an  SUV max of 14.4, measuring approximately 8.6 x 12.6 cm (4/137). Index gastrohepatic ligament lymph node measures approximately 2.3 cm (4/110) with an SUV max of 11.4. Extensive hypermetabolic peritoneal/omental thickening/caking, with an index 1.9 x 3.6 cm left omental nodule (4/121), SUV max 11.8. Inguinal lymph nodes measure up to 1.5 cm on the left (4/202) with an SUV max of 1.7. No abnormal hypermetabolism in the liver, adrenal glands, spleen or pancreas. Incidental CT findings: Visualized portion of the liver is grossly unremarkable. There may be sludge in the gallbladder. Adrenal glands and right kidney are grossly unremarkable. There may be a punctate stone in the lower pole left kidney. Left pelvicaliceal fullness versus mild hydronephrosis. Spleen is grossly unremarkable. Bladder appears thick-walled but is under distended. Ascites. SKELETON: No abnormal osseous hypermetabolism. Incidental CT findings: Degenerative changes in the spine. IMPRESSION: 1. Hypermetabolic adenopathy throughout the neck, chest, abdomen and pelvis. Findings are worst in the abdomen, with extensive peritoneal carcinomatosis. Small bowel involvement is not excluded. Findings are consistent with the provided history of lymphoma, [place] 5. 2. Moderate bilateral pleural effusions with compressive atelectasis in both lower lobes. 3. Left pelvicaliceal fullness versus mild hydronephrosis. 4. Question punctate left renal stone. Electronically Signed   By: Lorin Picket M.D.   On: 06/21/2020 09:04   IR US Guide Bx Asp/Drain  Result Date: 06/26/2020 INDICATION: Concern for lymphoma. Please perform ultrasound-guided biopsy of hypermetabolic inguinal lymph node for tissue diagnostic purposes Additionally, patient currently admitted with abdominal pain and distension and as such request made for ascites search ultrasound ultrasound-guided paracentesis for diagnostic and therapeutic purposes as indicated. EXAM: 1. ULTRASOUND-GUIDED  RIGHT INGUINAL LYMPH NODE BIOPSY 2. ULTRASOUND-GUIDED PARACENTESIS COMPARISON:  CT of the chest, abdomen and pelvis- 06/24/2020; PET-CT-06/21/2020 MEDICATIONS: None. COMPLICATIONS: None immediate. TECHNIQUE: Informed written consent was obtained from the patient after a discussion of the risks, benefits and alternatives to treatment. A timeout was performed prior to the initiation of the procedure. Initial ultrasound scanning demonstrates a small amount of intra-abdominal ascites with dominant pocket located within right upper abdomen adjacent to the hepatic parenchyma. As such, the skin overlying the right upper abdomen was subsequently prepped and draped in the usual sterile fashion. 1% lidocaine with epinephrine was used for local anesthesia. Under direct ultrasound guidance, an 8 Fr Safe-T-Centesis catheter was introduced. The paracentesis was performed. A small amount of aspirated fluid was capped and sent to the laboratory for cytologic analysis as well as triglyceride levels. The catheter was removed and a dressing was applied. Next, attention was paid towards ultrasound-guided biopsy of known hypermetabolic right inguinal lymphadenopathy. Sonographic evaluation of the right groin demonstrates an at least 2.4 x 0.8 cm right inguinal lymph node correlating with the dominant right inguinal lymph node seen on preceding abdominal CT image 90, series 2. The procedure was planned. The right groin was prepped and draped in usual sterile fashion. After the overlying soft tissues anesthetized 1% lidocaine with epinephrine, an 18 gauge  core needle biopsy device was utilized to obtain 6 core needle biopsy samples. Multiple ultrasound images were saved procedural documentation purposes. Samples were placed in saline and submitted to the laboratory for pathologic analysis. The patient tolerated the above procedures well without immediate postprocedural complication. FINDINGS: A total of approximately 2 liters of chylous  fluid was removed. Samples were sent to the laboratory as requested by the clinical team. Successful ultrasound-guided biopsy dominant right inguinal lymph node IMPRESSION: 1. Successful ultrasound-guided biopsy of dominant right inguinal node. 2. Successful ultrasound-guided paracentesis yielding 2 liters of chylous appearing fluid. Fluid was sent to the laboratory for cytologic analysis as well as triglyceride levels. Electronically Signed   By: Sandi Mariscal M.D.   On: 06/26/2020 13:16   DG Chest Port 1 View  Result Date: 06/24/2020 CLINICAL DATA:  Abdominal pain and swelling.  History of lymphoma. EXAM: PORTABLE CHEST 1 VIEW COMPARISON:  Chest radiograph 11/17/2017 FINDINGS: Normal cardiac and mediastinal contours. Small bilateral pleural effusions with underlying consolidation. No pleural effusion or pneumothorax. Osseous structures unremarkable. IMPRESSION: Small bilateral pleural effusions with underlying consolidation. Electronically Signed   By: Lovey Newcomer M.D.   On: 06/24/2020 14:11   ECHOCARDIOGRAM COMPLETE  Result Date: 06/25/2020    ECHOCARDIOGRAM REPORT   Patient Name:   George Proctor Date of Exam: 06/25/2020 Medical Rec #:  [id number]      Height:       73.0 in Accession #:    [id number]     Weight:       189.0 lb Date of Birth:  01-18-1956      BSA:          2.101 m Patient Age:    54 years       BP:           136/92 mmHg Patient Gender: M              HR:           120 bpm. Exam Location:  Inpatient Procedure: 2D Echo Indications:    786.09 dyspnea  History:        Patient has prior history of Echocardiogram examinations, most                 recent 08/11/2019. Lymphoma.  Sonographer:    Jannett Celestine RDCS (AE) Referring Phys: [phone] [facility]OK[facility]REL  Sonographer Comments: Suboptimal parasternal window and suboptimal subcostal window. IMPRESSIONS  1. Left ventricular ejection fraction, by estimation, is 65 to 70%. The left ventricle has normal function. The left ventricle has no regional wall  motion abnormalities. Left ventricular diastolic parameters are consistent with Grade I diastolic dysfunction (impaired relaxation).  2. Right ventricular systolic function is hyperdynamic. The right ventricular size is normal.  3. The mitral valve is normal in structure. No evidence of mitral valve regurgitation. No evidence of mitral stenosis.  4. The aortic valve is normal in structure. Aortic valve regurgitation is not visualized. No aortic stenosis is present. FINDINGS  Left Ventricle: Left ventricular ejection fraction, by estimation, is 65 to 70%. The left ventricle has normal function. The left ventricle has no regional wall motion abnormalities. The left ventricular internal cavity size was normal in size. There is  no left ventricular hypertrophy. Left ventricular diastolic parameters are consistent with Grade I diastolic dysfunction (impaired relaxation). Right Ventricle: The right ventricular size is normal. No increase in right ventricular wall thickness. Right ventricular systolic function is hyperdynamic. Left Atrium: Left atrial size was normal in  size. Right Atrium: Right atrial size was normal in size. Pericardium: There is no evidence of pericardial effusion. Mitral Valve: The mitral valve is normal in structure. No evidence of mitral valve regurgitation. No evidence of mitral valve stenosis. Tricuspid Valve: The tricuspid valve is normal in structure. Tricuspid valve regurgitation is trivial. No evidence of tricuspid stenosis. Aortic Valve: The aortic valve is normal in structure. Aortic valve regurgitation is not visualized. No aortic stenosis is present. Pulmonic Valve: The pulmonic valve was normal in structure. Pulmonic valve regurgitation is not visualized. No evidence of pulmonic stenosis. Aorta: The aortic root is normal in size and structure. Venous: The inferior vena cava was not well visualized. IAS/Shunts: No atrial level shunt detected by color flow Doppler.  LEFT VENTRICLE PLAX 2D  LVOT diam:     2.50 cm LV SV:         72 LV SV Index:   34 LVOT Area:     4.91 cm  RIGHT VENTRICLE RV S prime:     18.00 cm/s TAPSE (M-mode): 1.8 cm LEFT ATRIUM             Index       RIGHT ATRIUM           Index LA Vol (A2C):   60.4 ml 28.75 ml/m RA Area:     15.80 cm LA Vol (A4C):   42.5 ml 20.23 ml/m RA Volume:   32.40 ml  15.42 ml/m LA Biplane Vol: 54.0 ml 25.71 ml/m  AORTIC VALVE LVOT Vmax:   95.30 cm/s LVOT Vmean:  74.500 cm/s LVOT VTI:    0.146 m MITRAL VALVE               TRICUSPID VALVE MV Area (PHT): 3.31 cm    TR Peak grad:   13.7 mmHg MV Decel Time: 229 msec    TR Vmax:        185.00 cm/s MV E velocity: 69.40 cm/s MV A velocity: 87.00 cm/s  SHUNTS MV E/A ratio:  0.80        Systemic VTI:  0.15 m                            Systemic Diam: 2.50 cm Skeet Latch MD Electronically signed by Skeet Latch MD Signature Date/Time: 06/25/2020/4:06:39 PM    Final    IR Paracentesis  Result Date: 06/26/2020 INDICATION: Concern for lymphoma. Please perform ultrasound-guided biopsy of hypermetabolic inguinal lymph node for tissue diagnostic purposes Additionally, patient currently admitted with abdominal pain and distension and as such request made for ascites search ultrasound ultrasound-guided paracentesis for diagnostic and therapeutic purposes as indicated. EXAM: 1. ULTRASOUND-GUIDED RIGHT INGUINAL LYMPH NODE BIOPSY 2. ULTRASOUND-GUIDED PARACENTESIS COMPARISON:  CT of the chest, abdomen and pelvis- 06/24/2020; PET-CT-06/21/2020 MEDICATIONS: None. COMPLICATIONS: None immediate. TECHNIQUE: Informed written consent was obtained from the patient after a discussion of the risks, benefits and alternatives to treatment. A timeout was performed prior to the initiation of the procedure. Initial ultrasound scanning demonstrates a small amount of intra-abdominal ascites with dominant pocket located within right upper abdomen adjacent to the hepatic parenchyma. As such, the skin overlying the right upper  abdomen was subsequently prepped and draped in the usual sterile fashion. 1% lidocaine with epinephrine was used for local anesthesia. Under direct ultrasound guidance, an 8 Fr Safe-T-Centesis catheter was introduced. The paracentesis was performed. A small amount of aspirated fluid was capped and sent to the laboratory for  cytologic analysis as well as triglyceride levels. The catheter was removed and a dressing was applied. Next, attention was paid towards ultrasound-guided biopsy of known hypermetabolic right inguinal lymphadenopathy. Sonographic evaluation of the right groin demonstrates an at least 2.4 x 0.8 cm right inguinal lymph node correlating with the dominant right inguinal lymph node seen on preceding abdominal CT image 90, series 2. The procedure was planned. The right groin was prepped and draped in usual sterile fashion. After the overlying soft tissues anesthetized 1% lidocaine with epinephrine, an 18 gauge core needle biopsy device was utilized to obtain 6 core needle biopsy samples. Multiple ultrasound images were saved procedural documentation purposes. Samples were placed in saline and submitted to the laboratory for pathologic analysis. The patient tolerated the above procedures well without immediate postprocedural complication. FINDINGS: A total of approximately 2 liters of chylous fluid was removed. Samples were sent to the laboratory as requested by the clinical team. Successful ultrasound-guided biopsy dominant right inguinal lymph node IMPRESSION: 1. Successful ultrasound-guided biopsy of dominant right inguinal node. 2. Successful ultrasound-guided paracentesis yielding 2 liters of chylous appearing fluid. Fluid was sent to the laboratory for cytologic analysis as well as triglyceride levels. Electronically Signed   By: Sandi Mariscal M.D.   On: 06/26/2020 13:16    ASSESSMENT & PLAN:   64 year old gentleman with  #1 extensive cervical thoracic and primarily abdominal pelvic  lymphadenopathy highly suspicious for high-grade lymphoma. #2 abdominal distention and discomfort along with newly noted chylous effusion showing lymphocytic predominance. Patient status post paracentesis with removal of 2 L of fluid with improvement in symptoms. #3 shortness of breath-echo shows normal ejection fraction.  CT chest negative for PE or pneumonia.  Negative enzymes.  Plan -Status post inguinal lymph node biopsy as well as paracentesis with removal of 2 L of chylous effusion on 06/26/2020 with improvement in his abdominal discomfort and shortness of breath. -Discussed with pathology and preliminary results of biopsy consistent with lymphoma but additional stains pending to determine if low-grade versus high-grade.  They anticipate a final pathology result on 06/28/2020. -We discussed the findings of his imaging studies. -He has been started on high-dose prednisone at 80 mg p.o. daily to help with starting to treat his suspected lymphoma.  Patient is agreeable to this plan pending final biopsy results. -GI prophylaxis with PPI. -Uric acid level was elevated and he was started on allopurinol due to concern for tumor lysis syndrome.  Uric acid level trending downward today. -Lorazepam as needed for insomnia or anxiety or tremulousness related to high-dose steroids. -Patient was recommended to the prednisone on a full stomach. -Appreciate the excellent hospital medicine and nursing care on [address] [facility] for [facility]W -Pending biopsy result, may need to consider initiation of chemotherapy as an inpatient if he is found to have high-grade lymphoma. -We will follow-up final ascitic fluid results  Mikey Bussing, DNP, AGPCNP-BC, AOCNP Mon/Tues/Thurs/Fri 7am-5pm; Off Wednesdays Cell: [phone]  Addendum  .Patient was Personally and independently interviewed, examined and relevant elements of the history of present illness were reviewed in details and an assessment and plan was created. All  elements of the patient's history of present illness , assessment and plan were discussed in details with Mikey Bussing, DNP, AGPCNP-BC, AOCNP. The above documentation reflects our combined findings assessment and plan.  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 Final pathology expected 10/13 Uric acid coming down 13-->10 Plan -continue prednisone -continue allopurinol -will f/u final pathology on 10/13 for definitive rx plan George Proctor  [name] MD [place]

## 2020-06-27 NOTE — Progress Notes (Addendum)
PROGRESS NOTE  George Proctor IHK:[id number] DOB: Apr 29, 1956 DOA: 06/24/2020 PCP: [name], MD   LOS: 3 days   Brief narrative: As per HPI,  George Proctor is a 64 y.o. male with past medical history of anxiety, depression, GERD, presented to the hospital with worsening abdominal distention pain dyspnea and orthopnea.    Patient was recently seen by Dr. [name] oncology on 06/15/2020 for widespread lymphadenopathy.  Patient underwent a PET CT scan on 06/21/2020 with findings of hypermetabolic regions throughout the body, mostly in the abdomen with suspicion for peritoneal carcinomatosis as well.  Excisional biopsy has been planned for 07/27/2020.  In the interim, patient has been having complaining of increasing abdominal pain and swelling which had gotten worse and decided to come to the hospital.  In the ED, patient was noted to be in distress with mild tachycardia and tachypnea.    He was mild hypoxic at 89% on room air and was given 2 L of nasal cannula supplemental oxygen with improvement in his saturation.  Laboratory data was notable for normal CBC and chemistry except for low total protein and mildly elevated AST.  Coagulation profile was within normal limits.  Blood culture was also sent from the ED.  Chest x-ray showed some blunting of the bilateral costophrenic angles.  CT angiogram of the chest did not show any evidence of pulmonary embolism.  He did have small to moderate bilateral pleural effusion with partial atelectasis of the lower lobes.  There was note of extensive retroperitoneal, mesenteric and omental adenopathy and enlarged lymph nodes in the anterior cardiophrenic space.  He did have hydronephrosis as well.  ED provider spoke with Dr. [name] primary oncologist who advised inpatient admission for further work-up and treatment.   Assessment/Plan:  Principal Problem:   Abdominal pain Active Problems:   Anxiety   Attention deficit hyperactivity disorder (ADHD)   GERD   SOB  (shortness of breath)   Major depressive disorder   Lymphoma of lymph nodes of multiple regions (HCC)  Abdominal pain, distention and discomfort.  Patient does have extensive intra-abdominal lymphadenopathy and possible peritoneal carcinomatosis as per PET scan. Status post biopsy by interventional radiology on 06/26/2020 with paracentesis and removal of 2 L of chylous fluid.. Oncology on board.  Continue IV Dilaudid for adequate pain relief.    No evidence of bowel obstruction.  Urinalysis was negative for any evidence of infection.  Patient feels well better today.  Generalized lymphadenopathy, abdominal distention, basal pleural effusion.  Patient does have some pleuritic component of pain.  Likely lymphoma with some type B symptoms.  Patient does have night sweats.  LDH done on 06/15/2020 was more than 1000.  Hepatitis B and C was negative.  HIV was negative as well.  Await for biopsy.  Patient has been empirically started on prednisone high-dose by oncology since 06/26/2020.  Has been started on allopurinol empirically for tumor lysis syndrome.  He does have mild elevation of uric acid at this time.  LDH has decreased.  Peripheral edema. Could be secondary to lymphadenopathy, low albumin.  Received dose of lasix on 06/25/2020 with mild improvement of swelling.  Shortness of breath, dyspnea and acute hypoxic respiratory failure on presentation.  Continue incentive spirometry, supplemental oxygen.  2 L of oxygen by nasal cannula at this time.    BNP of 29.  Chest x-ray with mild to moderate pleural effusion.   Has been started on high-dose steroids.  Pleurisy has improved.  Likely his breathing and effusion  will improve with treatment of underlying problem.   2D echocardiogram done on 06/25/2020 showed LV ejection fraction of 65 to 70% with grade 1 diastolic dysfunction. On 2 L Oxygen   History of depression anxiety.  On Xanax as needed.   History of GERD  intolerance to omeprazole but could   tolerate Protonix   DVT prophylaxis: enoxaparin (LOVENOX) injection 40 mg Start: 06/27/20 1000   Code Status: Full code  Family Communication: none.  Patient states that he has been communicating with the family.  Status is: Inpatient  Remains inpatient appropriate because:IV treatments appropriate due to intensity of illness or inability to take PO, Inpatient level of care appropriate due to severity of illness and Severe abdominal pain requiring IV narcotics, status post biopsy will need to follow-up, potential need for inpatient chemotherapy initiation   Dispo: The patient is from: Home              Anticipated d/c is to: Home              Anticipated d/c date is: 2 days   Barriers to discharge.  Need for biopsy report, oncology planning potential chemo during hospitalization.              Patient currently is not medically stable to d/c.  Consultants:  Oncology  Interventional radiology  Procedures:  Ultrasound-guided right inguinal lymph node biopsy, ultrasound-guided paracentesis on 06/26/2020  Antibiotics:  . None  Anti-infectives (From admission, onward)   None     Subjective: Today, patient was seen and examined at bedside.  He feels overall better today with abdominal discomfort able to be with better with less pain.  No nausea or vomiting.  Has been tolerating oral okay.  Has had bowel movements.  Objective: Vitals:   06/27/20 0338 06/27/20 0704  BP: 123/84 (!) 135/95  Pulse: (!) 103 (!) 108  Resp: 16 16  Temp: 98 F (36.7 C) 97.8 F (36.6 C)  SpO2: 92% 94%    Intake/Output Summary (Last 24 hours) at 06/27/2020 0738 Last data filed at 06/27/2020 0708 Gross per 24 hour  Intake 600 ml  Output 2350 ml  Net -1750 ml   Filed Weights   06/24/20 1226  Weight: 85.7 kg   Body mass index is 24.94 kg/m.   Physical Exam: General:  Average built, not in obvious distress, hard of hearing, on nasal cannula oxygen HENT:   No scleral pallor or icterus  noted. Oral mucosa is moist.  Chest: Improved breath sounds bilaterally.  No wheezes. CVS: S1 &S2 heard. No murmur.  Regular rate and rhythm. Abdomen: Soft, nontender, nondistended.  Umbilical hernia noted bowel sounds are heard.   Extremities: No cyanosis, clubbing but lower extremity edema.  Peripheral pulses are palpable.  Right inguinal area with small incision site, mild ecchymosis noted Psych: Alert, awake and oriented, normal mood CNS:  No cranial nerve deficits.  Power equal in all extremities.   Skin: Warm and dry.     Data Review: I have personally reviewed the following laboratory data and studies,  CBC: Recent Labs  Lab 06/24/20 1345 06/25/20 0305 06/26/20 0326  WBC 5.4 4.5 5.1  NEUTROABS 4.4  --   --   HGB 14.8 14.0 14.1  HCT 44.4 42.5 43.9  MCV 85.5 86.6 87.6  PLT 303 278 256   Basic Metabolic Panel: Recent Labs  Lab 06/24/20 1345 06/25/20 0305 06/26/20 0326  NA 135 134* 134*  K 4.2 4.5 4.0  CL 99 99  96*  CO2 23 24 25   GLUCOSE 91 91 93  BUN 15 13 13   CREATININE 1.15 1.05 1.17  CALCIUM 9.1 8.7* 8.9  MG  --  2.0 1.9  PHOS  --  4.2  --    Liver Function Tests: Recent Labs  Lab 06/24/20 1345 06/25/20 0305  AST 97* 88*  ALT 30 29  ALKPHOS 126 113  BILITOT 0.9 0.8  PROT 6.3* 5.8*  ALBUMIN 3.5 3.3*   No results for input(s): LIPASE, AMYLASE in the last 168 hours. No results for input(s): AMMONIA in the last 168 hours. Cardiac Enzymes: No results for input(s): CKTOTAL, CKMB, CKMBINDEX, TROPONINI in the last 168 hours. BNP (last 3 results) Recent Labs    06/25/20 0300  BNP 29.7    ProBNP (last 3 results) No results for input(s): PROBNP in the last 8760 hours.  CBG: Recent Labs  Lab 06/21/20 0700  GLUCAP 90   Recent Results (from the past 240 hour(s))  Blood culture (routine single)     Status: None (Preliminary result)   Collection Time: 06/24/20  1:45 PM   Specimen: BLOOD  Result Value Ref Range Status   Specimen Description   Final     BLOOD Performed at [facility] [address], [place], [place] [address]    Special Requests   Final    NONE Performed at [facility], [place] [address], [place], [place] [address]    Culture   Final    NO GROWTH 3 DAYS Performed at [facility], [place] [address] [place],  [address]    Report Status PENDING  Incomplete  Respiratory Panel by RT PCR (Flu A&B, Covid) - Nasopharyngeal Swab     Status: None   Collection Time: 06/24/20  1:45 PM   Specimen: Nasopharyngeal Swab  Result Value Ref Range Status   SARS Coronavirus 2 by RT PCR NEGATIVE NEGATIVE Final    Comment: (NOTE) SARS-CoV-2 target nucleic acids are NOT DETECTED.  The SARS-CoV-2 RNA is generally detectable in upper respiratoy specimens during the acute phase of infection. The lowest concentration of SARS-CoV-2 viral copies this assay can detect is 131 copies/mL. A negative result does not preclude SARS-Cov-2 infection and should not be used as the sole basis for treatment or other patient management decisions. A negative result may occur with  improper specimen collection/handling, submission of specimen other than nasopharyngeal swab, presence of viral mutation(s) within the areas targeted by this assay, and inadequate number of viral copies (<131 copies/mL). A negative result must be combined with clinical observations, patient history, and epidemiological information. The expected result is Negative.  Fact Sheet for Patients:  PinkCheek.be  Fact Sheet for Healthcare Providers:  GravelBags.it  This test is no t yet approved or cleared by the [place] FDA and  has been authorized for detection and/or diagnosis of SARS-CoV-2 by FDA under an Emergency Use Authorization (EUA). This EUA will remain  in effect (meaning this test can be used) for the duration of the COVID-19 declaration under Section  564(b)(1) of the Act, 21 U.S.C. section 360bbb-3(b)(1), unless the authorization is terminated or revoked sooner.     Influenza A by PCR NEGATIVE NEGATIVE Final   Influenza B by PCR NEGATIVE NEGATIVE Final    Comment: (NOTE) The Xpert Xpress SARS-CoV-2/FLU/RSV assay is intended as an aid in  the diagnosis of influenza from Nasopharyngeal swab specimens and  should not be used as a sole basis for treatment. Nasal  washings and  aspirates are unacceptable for Xpert Xpress SARS-CoV-2/FLU/RSV  testing.  Fact Sheet for Patients: PinkCheek.be  Fact Sheet for Healthcare Providers: GravelBags.it  This test is not yet approved or cleared by the [place] FDA and  has been authorized for detection and/or diagnosis of SARS-CoV-2 by  FDA under an Emergency Use Authorization (EUA). This EUA will remain  in effect (meaning this test can be used) for the duration of the  Covid-19 declaration under Section 564(b)(1) of the Act, 21  U.S.C. section 360bbb-3(b)(1), unless the authorization is  terminated or revoked. Performed at [facility], [place] [address], [place], [place] [address]   Urine culture     Status: None   Collection Time: 06/24/20  5:28 PM   Specimen: In/Out Cath Urine  Result Value Ref Range Status   Specimen Description   Final    IN/OUT CATH URINE Performed at [facility] [address] [place], [place] [address]    Special Requests   Final    NONE Performed at [facility], [place] [address] [place], [place] [address]    Culture   Final    NO GROWTH Performed at [facility], [place] [address] [place], [place] [address]    Report Status 06/26/2020 FINAL  Final  Body fluid culture     Status: None (Preliminary result)   Collection Time: 06/26/20 11:40 AM   Specimen: PATH Cytology Peritoneal fluid  Result Value Ref Range Status   Specimen  Description   Final    PERITONEAL Performed at [facility] [address] [place], [place] [address]    Special Requests   Final    NONE Performed at [facility], [place] [address], [place], [place] [address]    Gram Stain   Final    RARE WBC PRESENT, PREDOMINANTLY MONONUCLEAR NO ORGANISMS SEEN Performed at [facility], [place] [address], [place], [place] [address]    Culture PENDING  Incomplete   Report Status PENDING  Incomplete     Studies: IR US Guide Bx Asp/Drain  Result Date: 06/26/2020 INDICATION: Concern for lymphoma. Please perform ultrasound-guided biopsy of hypermetabolic inguinal lymph node for tissue diagnostic purposes Additionally, patient currently admitted with abdominal pain and distension and as such request made for ascites search ultrasound ultrasound-guided paracentesis for diagnostic and therapeutic purposes as indicated. EXAM: 1. ULTRASOUND-GUIDED RIGHT INGUINAL LYMPH NODE BIOPSY 2. ULTRASOUND-GUIDED PARACENTESIS COMPARISON:  CT of the chest, abdomen and pelvis- 06/24/2020; PET-CT-06/21/2020 MEDICATIONS: None. COMPLICATIONS: None immediate. TECHNIQUE: Informed written consent was obtained from the patient after a discussion of the risks, benefits and alternatives to treatment. A timeout was performed prior to the initiation of the procedure. Initial ultrasound scanning demonstrates a small amount of intra-abdominal ascites with dominant pocket located within right upper abdomen adjacent to the hepatic parenchyma. As such, the skin overlying the right upper abdomen was subsequently prepped and draped in the usual sterile fashion. 1% lidocaine with epinephrine was used for local anesthesia. Under direct ultrasound guidance, an 8 Fr Safe-T-Centesis catheter was introduced. The paracentesis was performed. A small amount of aspirated fluid was capped and sent to the laboratory for cytologic analysis as well as triglyceride levels. The  catheter was removed and a dressing was applied. Next, attention was paid towards ultrasound-guided biopsy of known hypermetabolic right inguinal lymphadenopathy. Sonographic evaluation of the right groin demonstrates an at least 2.4 x 0.8 cm right inguinal lymph node correlating  with the dominant right inguinal lymph node seen on preceding abdominal CT image 90, series 2. The procedure was planned. The right groin was prepped and draped in usual sterile fashion. After the overlying soft tissues anesthetized 1% lidocaine with epinephrine, an 18 gauge core needle biopsy device was utilized to obtain 6 core needle biopsy samples. Multiple ultrasound images were saved procedural documentation purposes. Samples were placed in saline and submitted to the laboratory for pathologic analysis. The patient tolerated the above procedures well without immediate postprocedural complication. FINDINGS: A total of approximately 2 liters of chylous fluid was removed. Samples were sent to the laboratory as requested by the clinical team. Successful ultrasound-guided biopsy dominant right inguinal lymph node IMPRESSION: 1. Successful ultrasound-guided biopsy of dominant right inguinal node. 2. Successful ultrasound-guided paracentesis yielding 2 liters of chylous appearing fluid. Fluid was sent to the laboratory for cytologic analysis as well as triglyceride levels. Electronically Signed   By: Sandi Mariscal M.D.   On: 06/26/2020 13:16   ECHOCARDIOGRAM COMPLETE  Result Date: 06/25/2020    ECHOCARDIOGRAM REPORT   Patient Name:   George Proctor Date of Exam: 06/25/2020 Medical Rec #:  [id number]      Height:       73.0 in Accession #:    [id number]     Weight:       189.0 lb Date of Birth:  01-Feb-1956      BSA:          2.101 m Patient Age:    75 years       BP:           136/92 mmHg Patient Gender: M              HR:           120 bpm. Exam Location:  Inpatient Procedure: 2D Echo Indications:    786.09 dyspnea  History:        Patient  has prior history of Echocardiogram examinations, most                 recent 08/11/2019. Lymphoma.  Sonographer:    Jannett Celestine RDCS (AE) Referring Phys: [phone] [facility] Seraphina Mitchner  Sonographer Comments: Suboptimal parasternal window and suboptimal subcostal window. IMPRESSIONS  1. Left ventricular ejection fraction, by estimation, is 65 to 70%. The left ventricle has normal function. The left ventricle has no regional wall motion abnormalities. Left ventricular diastolic parameters are consistent with Grade I diastolic dysfunction (impaired relaxation).  2. Right ventricular systolic function is hyperdynamic. The right ventricular size is normal.  3. The mitral valve is normal in structure. No evidence of mitral valve regurgitation. No evidence of mitral stenosis.  4. The aortic valve is normal in structure. Aortic valve regurgitation is not visualized. No aortic stenosis is present. FINDINGS  Left Ventricle: Left ventricular ejection fraction, by estimation, is 65 to 70%. The left ventricle has normal function. The left ventricle has no regional wall motion abnormalities. The left ventricular internal cavity size was normal in size. There is  no left ventricular hypertrophy. Left ventricular diastolic parameters are consistent with Grade I diastolic dysfunction (impaired relaxation). Right Ventricle: The right ventricular size is normal. No increase in right ventricular wall thickness. Right ventricular systolic function is hyperdynamic. Left Atrium: Left atrial size was normal in size. Right Atrium: Right atrial size was normal in size. Pericardium: There is no evidence of pericardial effusion. Mitral Valve: The mitral valve is normal in structure. No evidence of mitral valve  regurgitation. No evidence of mitral valve stenosis. Tricuspid Valve: The tricuspid valve is normal in structure. Tricuspid valve regurgitation is trivial. No evidence of tricuspid stenosis. Aortic Valve: The aortic valve is normal in  structure. Aortic valve regurgitation is not visualized. No aortic stenosis is present. Pulmonic Valve: The pulmonic valve was normal in structure. Pulmonic valve regurgitation is not visualized. No evidence of pulmonic stenosis. Aorta: The aortic root is normal in size and structure. Venous: The inferior vena cava was not well visualized. IAS/Shunts: No atrial level shunt detected by color flow Doppler.  LEFT VENTRICLE PLAX 2D LVOT diam:     2.50 cm LV SV:         72 LV SV Index:   34 LVOT Area:     4.91 cm  RIGHT VENTRICLE RV S prime:     18.00 cm/s TAPSE (M-mode): 1.8 cm LEFT ATRIUM             Index       RIGHT ATRIUM           Index LA Vol (A2C):   60.4 ml 28.75 ml/m RA Area:     15.80 cm LA Vol (A4C):   42.5 ml 20.23 ml/m RA Volume:   32.40 ml  15.42 ml/m LA Biplane Vol: 54.0 ml 25.71 ml/m  AORTIC VALVE LVOT Vmax:   95.30 cm/s LVOT Vmean:  74.500 cm/s LVOT VTI:    0.146 m MITRAL VALVE               TRICUSPID VALVE MV Area (PHT): 3.31 cm    TR Peak grad:   13.7 mmHg MV Decel Time: 229 msec    TR Vmax:        185.00 cm/s MV E velocity: 69.40 cm/s MV A velocity: 87.00 cm/s  SHUNTS MV E/A ratio:  0.80        Systemic VTI:  0.15 m                            Systemic Diam: 2.50 cm Skeet Latch MD Electronically signed by Skeet Latch MD Signature Date/Time: 06/25/2020/4:06:39 PM    Final    IR Paracentesis  Result Date: 06/26/2020 INDICATION: Concern for lymphoma. Please perform ultrasound-guided biopsy of hypermetabolic inguinal lymph node for tissue diagnostic purposes Additionally, patient currently admitted with abdominal pain and distension and as such request made for ascites search ultrasound ultrasound-guided paracentesis for diagnostic and therapeutic purposes as indicated. EXAM: 1. ULTRASOUND-GUIDED RIGHT INGUINAL LYMPH NODE BIOPSY 2. ULTRASOUND-GUIDED PARACENTESIS COMPARISON:  CT of the chest, abdomen and pelvis- 06/24/2020; PET-CT-06/21/2020 MEDICATIONS: None. COMPLICATIONS: None  immediate. TECHNIQUE: Informed written consent was obtained from the patient after a discussion of the risks, benefits and alternatives to treatment. A timeout was performed prior to the initiation of the procedure. Initial ultrasound scanning demonstrates a small amount of intra-abdominal ascites with dominant pocket located within right upper abdomen adjacent to the hepatic parenchyma. As such, the skin overlying the right upper abdomen was subsequently prepped and draped in the usual sterile fashion. 1% lidocaine with epinephrine was used for local anesthesia. Under direct ultrasound guidance, an 8 Fr Safe-T-Centesis catheter was introduced. The paracentesis was performed. A small amount of aspirated fluid was capped and sent to the laboratory for cytologic analysis as well as triglyceride levels. The catheter was removed and a dressing was applied. Next, attention was paid towards ultrasound-guided biopsy of known hypermetabolic right inguinal lymphadenopathy. Sonographic evaluation of  the right groin demonstrates an at least 2.4 x 0.8 cm right inguinal lymph node correlating with the dominant right inguinal lymph node seen on preceding abdominal CT image 90, series 2. The procedure was planned. The right groin was prepped and draped in usual sterile fashion. After the overlying soft tissues anesthetized 1% lidocaine with epinephrine, an 18 gauge core needle biopsy device was utilized to obtain 6 core needle biopsy samples. Multiple ultrasound images were saved procedural documentation purposes. Samples were placed in saline and submitted to the laboratory for pathologic analysis. The patient tolerated the above procedures well without immediate postprocedural complication. FINDINGS: A total of approximately 2 liters of chylous fluid was removed. Samples were sent to the laboratory as requested by the clinical team. Successful ultrasound-guided biopsy dominant right inguinal lymph node IMPRESSION: 1. Successful  ultrasound-guided biopsy of dominant right inguinal node. 2. Successful ultrasound-guided paracentesis yielding 2 liters of chylous appearing fluid. Fluid was sent to the laboratory for cytologic analysis as well as triglyceride levels. Electronically Signed   By: Sandi Mariscal M.D.   On: 06/26/2020 13:16      Flora Lipps, MD  [facility] 06/27/2020

## 2020-06-28 DIAGNOSIS — J90 Pleural effusion, not elsewhere classified: Secondary | ICD-10-CM | POA: Diagnosis not present

## 2020-06-28 DIAGNOSIS — R1084 Generalized abdominal pain: Secondary | ICD-10-CM | POA: Diagnosis not present

## 2020-06-28 DIAGNOSIS — R188 Other ascites: Secondary | ICD-10-CM | POA: Diagnosis not present

## 2020-06-28 DIAGNOSIS — R591 Generalized enlarged lymph nodes: Secondary | ICD-10-CM | POA: Diagnosis not present

## 2020-06-28 LAB — CBC
HCT: 36.9 % — ABNORMAL LOW (ref 39.0–52.0)
Hemoglobin: 12.3 g/dL — ABNORMAL LOW (ref 13.0–17.0)
MCH: 28.3 pg (ref 26.0–34.0)
MCHC: 33.3 g/dL (ref 30.0–36.0)
MCV: 84.8 fL (ref 80.0–100.0)
Platelets: 297 10*3/uL (ref 150–400)
RBC: 4.35 MIL/uL (ref 4.22–5.81)
RDW: 13.1 % (ref 11.5–15.5)
WBC: 5.3 10*3/uL (ref 4.0–10.5)
nRBC: 0 % (ref 0.0–0.2)

## 2020-06-28 LAB — BASIC METABOLIC PANEL
Anion gap: 9 (ref 5–15)
BUN: 23 mg/dL (ref 8–23)
CO2: 26 mmol/L (ref 22–32)
Calcium: 8.5 mg/dL — ABNORMAL LOW (ref 8.9–10.3)
Chloride: 100 mmol/L (ref 98–111)
Creatinine, Ser: 1.1 mg/dL (ref 0.61–1.24)
GFR, Estimated: >60 mL/min (ref >60)
Glucose, Bld: 116 mg/dL — ABNORMAL HIGH (ref 70–99)
Potassium: 4.4 mmol/L (ref 3.5–5.1)
Sodium: 135 mmol/L (ref 135–145)

## 2020-06-28 LAB — PH, BODY FLUID: pH, Body Fluid: 7

## 2020-06-28 LAB — MAGNESIUM: Magnesium: 2.3 mg/dL (ref 1.7–2.4)

## 2020-06-28 NOTE — Progress Notes (Signed)
[facility]  PROGRESS NOTE  DOVER HEAD WER:[id number] DOB: 1956/09/04 DOA: 06/24/2020 PCP: [name], MD   Brief HPI:   64 year old male with past medical history of anxiety, depression, GERD came to ED with complaints of abdominal pain, dyspnea and orthopnea.  Patient was seen by Dr. [name],  oncology on 06/15/2020 for widespread lymphadenopathy.  Patient underwent PET/CT scan on 06/21/2020 with findings of hypermetabolic lesions throughout the body mostly in the abdominal suspicion for peritoneal carcinomatosis.   Patient came to ED with worsening abdominal pain, and swelling.  He was noted to be tachypneic with tachycardia.  Chest x-ray showed blunting of bilateral costophrenic angles.  CTA chest did not show PE.  Did have small bilateral pleural effusion with partial atelectasis of the lower lobes.   Patient underwent ultrasound-guided biopsy of right inguinal lymph node as well as ultrasound guided paracentesis yielding 2 L of chylous appearing fluid on 06/26/2020.  Biopsy results pending.  Subjective   Patient seen and examined, denies any complaints.  No chest pain or shortness of breath.   Assessment/Plan:     1. Abdominal distention-PET/CT scan showed extensive intra-abdominal lymphadenopathy, peritoneal carcinomatosis.  Patient underwent right inguinal lymph node biopsy as well as paracentesis yielding chylous fluid.  Peritoneal fluid analysis shows non-Hodgkin's B-cell lymphoma.  Right inguinal tissue biopsy specimen was insufficient for analysis.  Continue high-dose prednisone 80 mg daily.  Oncology following. 2. Generalized lymphadenopathy-likely due to lymphoma as above.  Hepatitis B and C were negative.  HIV was negative. 3. Peripheral edema-likely from anasarca , albumin is 3.2.  Also could be from widespread lymphadenopathy. 4. Dyspnea-resolved chest x-ray showed mild to moderate pleural effusion.  Echocardiogram showed EF 60 to 70% with grade 1 diastolic dysfunction.   Patient did receive 1 dose of Lasix yesterday.  He is currently on 1 L/min  oxygen via nasal cannula. 5. ?  Tumor lysis syndrome-uric acid level elevated at 13.1.  Patient started on allopurinol.  Uric acid has come down to 10.3.     COVID-19 Labs  Recent Labs    06/27/20 0742  LDH 639*    Lab Results  Component Value Date   SARSCOV2NAA NEGATIVE 06/24/2020     Scheduled medications:   . allopurinol  100 mg Oral BID  . enoxaparin (LOVENOX) injection  40 mg Subcutaneous Q24H  . pantoprazole  40 mg Oral Daily  . predniSONE  80 mg Oral Q breakfast  . senna  1 tablet Oral BID         CBG: No results for input(s): GLUCAP in the last 168 hours.  SpO2: 92 % O2 Flow Rate (L/min): 1 L/min    CBC: Recent Labs  Lab 06/24/20 1345 06/25/20 0305 06/26/20 0326 06/27/20 0742 06/28/20 0311  WBC 5.4 4.5 5.1 4.9 5.3  NEUTROABS 4.4  --   --  4.3  --   HGB 14.8 14.0 14.1 13.4 12.3*  HCT 44.4 42.5 43.9 41.1 36.9*  MCV 85.5 86.6 87.6 85.4 84.8  PLT 303 278 297 311 195    Basic Metabolic Panel: Recent Labs  Lab 06/24/20 1345 06/25/20 0305 06/26/20 0326 06/27/20 0742 06/28/20 0311  NA 135 134* 134* 135 135  K 4.2 4.5 4.0 4.6 4.4  CL 99 99 96* 98 100  CO2 23 24 25 26 26   GLUCOSE 91 91 93 124* 116*  BUN 15 13 13 23 23   CREATININE 1.15 1.05 1.17 1.17 1.10  CALCIUM 9.1 8.7* 8.9 8.9 8.5*  MG  --  2.0 1.9  --  2.3  PHOS  --  4.2  --   --   --      Liver Function Tests: Recent Labs  Lab 06/24/20 1345 06/25/20 0305 06/27/20 0742  AST 97* 88* 70*  ALT 30 29 25   ALKPHOS 126 113 105  BILITOT 0.9 0.8 0.4  PROT 6.3* 5.8* 5.7*  ALBUMIN 3.5 3.3* 3.2*     Antibiotics: Anti-infectives (From admission, onward)   None       DVT prophylaxis: Lovenox  Code Status: Full code  Family Communication: No family at bedside    Status is: Inpatient  Dispo: The patient is from: Home              Anticipated d/c is to: Home              Anticipated d/c date is:  06/30/2020              Patient currently not medically stable for discharge  Barrier to discharge-ongoing work-up for lymphoma      Consultants:  Oncology  Procedures:  Right inguinal lymph node biopsy, ultrasound-guided paracentesis   Objective   Vitals:   06/27/20 2224 06/28/20 0641 06/28/20 0731 06/28/20 1329  BP: 115/85 122/70  (!) 130/91  Pulse: (!) 102 (!) 107 (!) 104 (!) 108  Resp: 19 17  19   Temp: 98.6 F (37 C) 97.9 F (36.6 C)  98.5 F (36.9 C)  TempSrc: Oral Oral  Oral  SpO2: 92% 93% 92% 92%  Weight:      Height:        Intake/Output Summary (Last 24 hours) at 06/28/2020 1736 Last data filed at 06/28/2020 1642 Gross per 24 hour  Intake 80 ml  Output 1575 ml  Net -1495 ml    10/11 1901 - 10/13 0700 In: 660 [P.O.:660] Out: 2251 [Urine:2250]  Filed Weights   06/24/20 1226  Weight: 85.7 kg    Physical Examination:    General: Appears in no acute distress  Cardiovascular: S1-S2, regular, no murmur auscultated  Respiratory: Clear to auscultation bilaterally  Abdomen: Abdomen is soft, mildly distended, nontender palpation  Extremities: Bilateral 1+ pitting edema in the lower extremities  Neurologic: Alert, oriented x3, intact insight and judgment    Data Reviewed:   Recent Results (from the past 240 hour(s))  Blood culture (routine single)     Status: None (Preliminary result)   Collection Time: 06/24/20  1:45 PM   Specimen: BLOOD  Result Value Ref Range Status   Specimen Description   Final    BLOOD Performed at [facility], [place] [address] [place], [place] [address]    Special Requests   Final    NONE Performed at [facility], [place] [address], [place], [place] [address]    Culture   Final    NO GROWTH 4 DAYS Performed at [facility], [place] [address], [place], [place] [address]    Report Status PENDING  Incomplete  Respiratory Panel by RT PCR (Flu A&B, Covid) - Nasopharyngeal  Swab     Status: None   Collection Time: 06/24/20  1:45 PM   Specimen: Nasopharyngeal Swab  Result Value Ref Range Status   SARS Coronavirus 2 by RT PCR NEGATIVE NEGATIVE Final    Comment: (NOTE) SARS-CoV-2 target nucleic acids are NOT DETECTED.  The SARS-CoV-2 RNA is generally detectable in upper respiratoy specimens during the acute phase of infection. The lowest concentration of SARS-CoV-2 viral  copies this assay can detect is 131 copies/mL. A negative result does not preclude SARS-Cov-2 infection and should not be used as the sole basis for treatment or other patient management decisions. A negative result may occur with  improper specimen collection/handling, submission of specimen other than nasopharyngeal swab, presence of viral mutation(s) within the areas targeted by this assay, and inadequate number of viral copies (<131 copies/mL). A negative result must be combined with clinical observations, patient history, and epidemiological information. The expected result is Negative.  Fact Sheet for Patients:  PinkCheek.be  Fact Sheet for Healthcare Providers:  GravelBags.it  This test is no t yet approved or cleared by the [place] FDA and  has been authorized for detection and/or diagnosis of SARS-CoV-2 by FDA under an Emergency Use Authorization (EUA). This EUA will remain  in effect (meaning this test can be used) for the duration of the COVID-19 declaration under Section 564(b)(1) of the Act, 21 U.S.C. section 360bbb-3(b)(1), unless the authorization is terminated or revoked sooner.     Influenza A by PCR NEGATIVE NEGATIVE Final   Influenza B by PCR NEGATIVE NEGATIVE Final    Comment: (NOTE) The Xpert Xpress SARS-CoV-2/FLU/RSV assay is intended as an aid in  the diagnosis of influenza from Nasopharyngeal swab specimens and  should not be used as a sole basis for treatment. Nasal washings and  aspirates are  unacceptable for Xpert Xpress SARS-CoV-2/FLU/RSV  testing.  Fact Sheet for Patients: PinkCheek.be  Fact Sheet for Healthcare Providers: GravelBags.it  This test is not yet approved or cleared by the [place] FDA and  has been authorized for detection and/or diagnosis of SARS-CoV-2 by  FDA under an Emergency Use Authorization (EUA). This EUA will remain  in effect (meaning this test can be used) for the duration of the  Covid-19 declaration under Section 564(b)(1) of the Act, 21  U.S.C. section 360bbb-3(b)(1), unless the authorization is  terminated or revoked. Performed at [facility], [place] [address], [place], [place] [address]   Urine culture     Status: None   Collection Time: 06/24/20  5:28 PM   Specimen: In/Out Cath Urine  Result Value Ref Range Status   Specimen Description   Final    IN/OUT CATH URINE Performed at [place] [address], [place], [place] [address]    Special Requests   Final    NONE Performed at [facility], [place] [address], [place], [place] [address]    Culture   Final    NO GROWTH Performed at [facility], [place] [address] [place], [place] [address]    Report Status 06/26/2020 FINAL  Final  Body fluid culture     Status: None (Preliminary result)   Collection Time: 06/26/20 11:40 AM   Specimen: PATH Cytology Peritoneal fluid  Result Value Ref Range Status   Specimen Description   Final    PERITONEAL Performed at [place] [address] [place], [place] [address]    Special Requests   Final    NONE Performed at [facility], [place] [address], [place], [place] [address]    Gram Stain   Final    RARE WBC PRESENT, PREDOMINANTLY MONONUCLEAR NO ORGANISMS SEEN    Culture   Final    NO GROWTH 2 DAYS Performed at [facility], [place] [address], [place],  [address]     Report Status PENDING  Incomplete    No  results for input(s): LIPASE, AMYLASE in the last 168 hours. No results for input(s): AMMONIA in the last 168 hours.  Cardiac Enzymes: No results for input(s): CKTOTAL, CKMB, CKMBINDEX, TROPONINI in the last 168 hours. BNP (last 3 results) Recent Labs    06/25/20 0300  BNP 29.7    ProBNP (last 3 results) No results for input(s): PROBNP in the last 8760 hours.  Studies:  No results found.     Oswald Hillock   [facility] If 7PM-7AM, please contact night-coverage at [email or web address], Office  [phone]   06/28/2020, 5:36 PM  LOS: 4 days

## 2020-06-29 ENCOUNTER — Other Ambulatory Visit: Payer: Self-pay | Admitting: Hematology

## 2020-06-29 ENCOUNTER — Inpatient Hospital Stay (HOSPITAL_COMMUNITY): Payer: 59

## 2020-06-29 DIAGNOSIS — J90 Pleural effusion, not elsewhere classified: Secondary | ICD-10-CM | POA: Diagnosis not present

## 2020-06-29 DIAGNOSIS — C8248 Follicular lymphoma grade IIIb, lymph nodes of multiple sites: Secondary | ICD-10-CM

## 2020-06-29 DIAGNOSIS — R609 Edema, unspecified: Secondary | ICD-10-CM | POA: Diagnosis not present

## 2020-06-29 DIAGNOSIS — C8598 Non-Hodgkin lymphoma, unspecified, lymph nodes of multiple sites: Secondary | ICD-10-CM | POA: Diagnosis not present

## 2020-06-29 DIAGNOSIS — Z7189 Other specified counseling: Secondary | ICD-10-CM | POA: Insufficient documentation

## 2020-06-29 DIAGNOSIS — R591 Generalized enlarged lymph nodes: Secondary | ICD-10-CM | POA: Diagnosis not present

## 2020-06-29 DIAGNOSIS — R188 Other ascites: Secondary | ICD-10-CM | POA: Diagnosis not present

## 2020-06-29 DIAGNOSIS — R1084 Generalized abdominal pain: Secondary | ICD-10-CM | POA: Diagnosis not present

## 2020-06-29 LAB — COMPREHENSIVE METABOLIC PANEL
ALT: 32 U/L (ref 0–44)
AST: 75 U/L — ABNORMAL HIGH (ref 15–41)
Albumin: 3.1 g/dL — ABNORMAL LOW (ref 3.5–5.0)
Alkaline Phosphatase: 86 U/L (ref 38–126)
Anion gap: 10 (ref 5–15)
BUN: 19 mg/dL (ref 8–23)
CO2: 27 mmol/L (ref 22–32)
Calcium: 8.5 mg/dL — ABNORMAL LOW (ref 8.9–10.3)
Chloride: 102 mmol/L (ref 98–111)
Creatinine, Ser: 0.9 mg/dL (ref 0.61–1.24)
GFR, Estimated: >60 mL/min (ref >60)
Glucose, Bld: 99 mg/dL (ref 70–99)
Potassium: 4.3 mmol/L (ref 3.5–5.1)
Sodium: 139 mmol/L (ref 135–145)
Total Bilirubin: 0.4 mg/dL (ref 0.3–1.2)
Total Protein: 5.1 g/dL — ABNORMAL LOW (ref 6.5–8.1)

## 2020-06-29 LAB — URIC ACID: Uric Acid, Serum: 5.7 mg/dL (ref 3.7–8.6)

## 2020-06-29 LAB — BODY FLUID CULTURE: Culture: NO GROWTH

## 2020-06-29 LAB — CULTURE, BLOOD (SINGLE): Culture: NO GROWTH

## 2020-06-29 LAB — SURGICAL PATHOLOGY

## 2020-06-29 MED ORDER — ONDANSETRON HCL 8 MG PO TABS: 8.0000 mg | ORAL_TABLET | Freq: Two times a day (BID) | ORAL | 1 refills | Status: DC | PRN

## 2020-06-29 MED ORDER — LORAZEPAM 0.5 MG PO TABS: 0.5000 mg | ORAL_TABLET | Freq: Four times a day (QID) | ORAL | 0 refills | Status: DC | PRN

## 2020-06-29 MED ORDER — PROCHLORPERAZINE MALEATE 10 MG PO TABS: 10.0000 mg | ORAL_TABLET | Freq: Four times a day (QID) | ORAL | 6 refills | Status: DC | PRN

## 2020-06-29 MED ORDER — OXYCODONE-ACETAMINOPHEN 7.5-325 MG PO TABS
1.0000 | ORAL_TABLET | Freq: Four times a day (QID) | ORAL | Status: DC | PRN
  Administered 2020-06-29 – 2020-07-01 (×6): 2 via ORAL
  Filled 2020-06-29: qty 2
  Filled 2020-06-29 (×2): qty 1
  Filled 2020-06-29 (×2): qty 2
  Filled 2020-06-29: qty 1
  Filled 2020-06-29 (×3): qty 2

## 2020-06-29 MED ORDER — PREDNISONE 20 MG PO TABS: 60.0000 mg | ORAL_TABLET | Freq: Every day | ORAL | 5 refills | Status: DC

## 2020-06-29 MED ORDER — LIDOCAINE HCL 1 % IJ SOLN
INTRAMUSCULAR | Status: AC
  Filled 2020-06-29: qty 20

## 2020-06-29 MED ORDER — LIDOCAINE-PRILOCAINE 2.5-2.5 % EX CREA: TOPICAL_CREAM | CUTANEOUS | 3 refills | Status: DC

## 2020-06-29 NOTE — Progress Notes (Signed)
START ON PATHWAY REGIMEN - Lymphoma and CLL     A cycle is every 21 days:     Prednisone      Rituximab-xxxx      Cyclophosphamide      Doxorubicin      Vincristine   **Always confirm dose/schedule in your pharmacy ordering system**  Patient Characteristics: Diffuse Large B-Cell Lymphoma or Follicular Lymphoma, Grade 3B, First Line, Stage III and IV Disease Type: Follicular Lymphoma, Grade 3B Disease Type: Not Applicable Disease Type: Not Applicable Line of therapy: First Line Intent of Therapy: Non-Curative / Palliative Intent, Discussed with Patient

## 2020-06-29 NOTE — Consult Note (Signed)
Chief Complaint: Patient was seen in consultation today for probable lymphoma/Port-a-cath placement.  Referring Physician(s): Brunetta Genera (oncology)  Supervising Physician: Aletta Edouard  Patient Status: [facility] - In-pt  History of Present Illness: MD SMOLA is a 64 y.o. male with a past medical history of GERD, anxiety, and depression. Of note, patient was recently referred to oncology by his PCP for management of probable widespread lymphoma. He met with Dr. [name] 06/15/2020 on an outpatient basis and at that time was planned for outpatient lymph node biopsy for tissue diagnosis. He presented to [facility] ED 06/24/2020 with complaints of abdominal pain and dyspnea. He was admitted for further management of symptoms and oncology work-up. He underwent an image-guided right inguinal lymph node biopsy (pathology pending) along with paracentesis in IR 06/26/2020. Per oncology, patient has tentative plans to begin systemic chemotherapy during this admission (pending pathology result) due to probable high-grade lymphoma.  NM PET 06/21/2020: 1. Hypermetabolic adenopathy throughout the neck, chest, abdomen and pelvis. Findings are worst in the abdomen, with extensive peritoneal carcinomatosis. Small bowel involvement is not excluded. Findings are consistent with the provided history of lymphoma, Deauville 5. 2. Moderate bilateral pleural effusions with compressive atelectasis in both lower lobes. 3. Left pelvicaliceal fullness versus mild hydronephrosis. 4. Question punctate left renal stone.  CT abdomen/pelvis 06/24/2020: 1. No CT evidence of central pulmonary artery embolus. 2. Small to moderate bilateral pleural effusions with associated partial compressive atelectasis of the lower lobes. Pneumonia is not excluded clinical correlation is recommended. 3. Extensive retroperitoneal, mesenteric, and omental adenopathy in keeping with history of lymphoma. Enlarged lymph nodes in the anterior  cardiophrenic space. 4. Moderate ascites. 5. Mild left hydronephrosis. 6. Aortic Atherosclerosis (ICD10-I70.0).  IR consulted by Dr. [name] for possible image-guided Port-a-cath placement. Patient awake and alert sitting on stretcher in [place]. Complains of abdominal pain and dyspnea secondary to abdominal distension, stable since admission. Denies fever, chills, chest pain, or headache.  LD Lovenox SQ this AM at 0843.   Past Medical History:  Diagnosis Date  . Anxiety   . Depression   . Gait difficulty   . Lymphoma ([place])   . Memory loss     Past Surgical History:  Procedure Laterality Date  . ABDOMINAL SURGERY     Childhood  . BUNIONECTOMY    . IR PARACENTESIS  06/26/2020  . IR US GUIDE BX ASP/DRAIN  06/26/2020    Allergies: Omeprazole  Medications: Prior to Admission medications   Medication Sig Start Date End Date Taking? Authorizing Provider  Ascorbic Acid (VITAMIN C) 1000 MG tablet Take 1,000 mg by mouth daily.   Yes [provider]  B Complex Vitamins (VITAMIN B COMPLEX PO) Take 1 tablet by mouth daily.    Yes [provider]  Multiple Vitamin (MULTIVITAMIN) capsule Take 1 capsule by mouth daily.   Yes [provider]  oxyCODONE (OXY IR/ROXICODONE) 5 MG immediate release tablet Take 1 tablet (5 mg total) by mouth every 4 (four) hours as needed for severe pain. 06/21/20  Yes Brunetta Genera, MD  pantoprazole (PROTONIX) 40 MG tablet Take 40 mg by mouth 2 (two) times daily as needed (indigestion).  01/10/19  Yes [provider]  polyethylene glycol (MIRALAX) 17 g packet Take 17 g by mouth daily. 06/21/20  Yes Brunetta Genera, MD  senna-docusate (SENNA S) 8.6-50 MG tablet Take 2 tablets by mouth at bedtime. 06/21/20  Yes Brunetta Genera, MD  testosterone cypionate (DEPOTESTOSTERONE CYPIONATE) 200 MG/ML injection Inject into  the muscle once a week.  12/25/19  Yes [provider]     Family History  Problem Relation Age  of Onset  . Aneurysm Mother 76       Thoracic  . Hypertension Father   . Heart failure Father   . Heart disease Father   . Heart attack Paternal Grandfather   . Depression Cousin   . Bipolar disorder Cousin   . Schizophrenia Maternal Uncle     Social History   Socioeconomic History  . Marital status: Married    Spouse name: Not on file  . Number of children: 0  . Years of education: Veterinary surgeon  . Highest education level: Not on file  Occupational History  . Occupation: Scientist, research (physical sciences)  Tobacco Use  . Smoking status: Never Smoker  . Smokeless tobacco: Never Used  Vaping Use  . Vaping Use: Never used  Substance and Sexual Activity  . Alcohol use: No    Alcohol/week: 0.0 standard drinks    Comment: Less than one drink per week.  . Drug use: No  . Sexual activity: Not Currently  Other Topics Concern  . Not on file  Social History Narrative   Lives at home with wife.   Right-handed.   3 cups caffeine per day.   Social Determinants of Health   Financial Resource Strain:   . Difficulty of Paying Living Expenses: Not on file  Food Insecurity:   . Worried About Charity fundraiser in the Last Year: Not on file  . Ran Out of Food in the Last Year: Not on file  Transportation Needs:   . Lack of Transportation (Medical): Not on file  . Lack of Transportation (Non-Medical): Not on file  Physical Activity:   . Days of Exercise per Week: Not on file  . Minutes of Exercise per Session: Not on file  Stress:   . Feeling of Stress : Not on file  Social Connections:   . Frequency of Communication with Friends and Family: Not on file  . Frequency of Social Gatherings with Friends and Family: Not on file  . Attends Religious Services: Not on file  . Active Member of Clubs or Organizations: Not on file  . Attends Archivist Meetings: Not on file  . Marital Status: Not on file     Review of Systems: A 12 point ROS discussed and pertinent  positives are indicated in the HPI above.  All other systems are negative.  Review of Systems  Constitutional: Negative for chills and fever.  Respiratory: Positive for shortness of breath. Negative for wheezing.   Cardiovascular: Negative for chest pain and palpitations.  Gastrointestinal: Positive for abdominal pain.  Neurological: Negative for headaches.  Psychiatric/Behavioral: Negative for behavioral problems and confusion.    Vital Signs: BP 125/90 (BP Location: Right Arm)   Pulse 96   Temp 98.5 F (36.9 C) (Oral)   Resp 14   Ht 6' 1" (1.854 m)   Wt 189 lb (85.7 kg)   SpO2 93%   BMI 24.94 kg/m   Physical Exam Vitals and nursing note reviewed.  Constitutional:      General: He is not in acute distress. Cardiovascular:     Rate and Rhythm: Normal rate and regular rhythm.     Heart sounds: Normal heart sounds. No murmur heard.   Pulmonary:     Effort: Pulmonary effort is normal. No respiratory distress.     Breath sounds: Normal breath sounds. No  wheezing.  Skin:    General: Skin is warm and dry.  Neurological:     Mental Status: He is alert and oriented to person, place, and time.  Psychiatric:        Mood and Affect: Mood normal.        Behavior: Behavior normal.      MD Evaluation Airway: WNL Heart: WNL Abdomen: WNL Chest/ Lungs: WNL ASA  Classification: 2 Mallampati/Airway Score: One   Imaging: CT Angio Chest PE W and/or Wo Contrast  Result Date: 06/24/2020 CLINICAL DATA:  64 year old male with recent diagnosis of lymphoma presenting with shortness of breath. Concern for pulmonary embolism. EXAM: CT ANGIOGRAPHY CHEST CT ABDOMEN AND PELVIS WITH CONTRAST TECHNIQUE: Multidetector CT imaging of the chest was performed using the standard protocol during bolus administration of intravenous contrast. Multiplanar CT image reconstructions and MIPs were obtained to evaluate the vascular anatomy. Multidetector CT imaging of the abdomen and pelvis was performed  using the standard protocol during bolus administration of intravenous contrast. CONTRAST:  170m OMNIPAQUE IOHEXOL 350 MG/ML SOLN COMPARISON:  Chest radiograph dated 06/24/2020. FINDINGS: Evaluation of this exam is limited due to respiratory motion artifact. CTA CHEST FINDINGS Cardiovascular: There is no cardiomegaly or pericardial effusion. The thoracic aorta is unremarkable. Evaluation of the pulmonary arteries is limited due to respiratory motion artifact and suboptimal opacification and timing of the contrast. No large or central pulmonary artery embolus identified. Mediastinum/Nodes: There is no hilar adenopathy. Enlarged lymph nodes noted anterior to the heart and in the anterior cardiophrenic space. The esophagus and the thyroid gland are grossly unremarkable. No mediastinal fluid collection. Lungs/Pleura: Small to moderate bilateral pleural effusions with associated partial compressive atelectasis the lower lobes. Pneumonia is not excluded clinical correlation is recommended. There is no pneumothorax. The central airways are patent. Musculoskeletal: No chest wall abnormality. No acute or significant osseous findings. Review of the MIP images confirms the above findings. CT ABDOMEN and PELVIS FINDINGS No intra-abdominal free air. There is moderate ascites. Hepatobiliary: The liver is unremarkable. No intrahepatic biliary ductal dilatation. The gallbladder is unremarkable. Pancreas: The pancreas is suboptimally visualized but grossly unremarkable. Spleen: Normal in size without focal abnormality. Adrenals/Urinary Tract: The adrenal glands unremarkable. There is mild left hydronephrosis. There is delayed excretion of contrast by left kidney. The right kidney is unremarkable. The urinary bladder is grossly unremarkable. Stomach/Bowel: There is moderate stool throughout the colon. There is no bowel obstruction. The appendix is not visualized with certainty. No inflammatory changes identified in the right lower  quadrant. Vascular/Lymphatic: Mild aortoiliac atherosclerotic disease. The IVC is grossly unremarkable. No portal venous gas. Extensive retroperitoneal adenopathy encasing the abdominal aorta and iliac arteries. There is diffuse mesenteric and omental implants and caking. Large soft tissue mass in the mesentery encasing the SMA measures approximately 11 x 17 cm in axial dimensions and 15 cm in craniocaudal length. Reproductive: The prostate is grossly unremarkable. Other: Diffuse subcutaneous edema. Small fat containing umbilical hernia. Musculoskeletal: No acute or significant osseous findings. Review of the MIP images confirms the above findings. IMPRESSION: 1. No CT evidence of central pulmonary artery embolus. 2. Small to moderate bilateral pleural effusions with associated partial compressive atelectasis of the lower lobes. Pneumonia is not excluded clinical correlation is recommended. 3. Extensive retroperitoneal, mesenteric, and omental adenopathy in keeping with history of lymphoma. Enlarged lymph nodes in the anterior cardiophrenic space. 4. Moderate ascites. 5. Mild left hydronephrosis. 6. Aortic Atherosclerosis (ICD10-I70.0). Electronically Signed   By: AAnner CreteM.D.   On:  06/24/2020 16:02   CT ABDOMEN PELVIS W CONTRAST  Result Date: 06/24/2020 CLINICAL DATA:  64 year old male with recent diagnosis of lymphoma presenting with shortness of breath. Concern for pulmonary embolism. EXAM: CT ANGIOGRAPHY CHEST CT ABDOMEN AND PELVIS WITH CONTRAST TECHNIQUE: Multidetector CT imaging of the chest was performed using the standard protocol during bolus administration of intravenous contrast. Multiplanar CT image reconstructions and MIPs were obtained to evaluate the vascular anatomy. Multidetector CT imaging of the abdomen and pelvis was performed using the standard protocol during bolus administration of intravenous contrast. CONTRAST:  18m OMNIPAQUE IOHEXOL 350 MG/ML SOLN COMPARISON:  Chest  radiograph dated 06/24/2020. FINDINGS: Evaluation of this exam is limited due to respiratory motion artifact. CTA CHEST FINDINGS Cardiovascular: There is no cardiomegaly or pericardial effusion. The thoracic aorta is unremarkable. Evaluation of the pulmonary arteries is limited due to respiratory motion artifact and suboptimal opacification and timing of the contrast. No large or central pulmonary artery embolus identified. Mediastinum/Nodes: There is no hilar adenopathy. Enlarged lymph nodes noted anterior to the heart and in the anterior cardiophrenic space. The esophagus and the thyroid gland are grossly unremarkable. No mediastinal fluid collection. Lungs/Pleura: Small to moderate bilateral pleural effusions with associated partial compressive atelectasis the lower lobes. Pneumonia is not excluded clinical correlation is recommended. There is no pneumothorax. The central airways are patent. Musculoskeletal: No chest wall abnormality. No acute or significant osseous findings. Review of the MIP images confirms the above findings. CT ABDOMEN and PELVIS FINDINGS No intra-abdominal free air. There is moderate ascites. Hepatobiliary: The liver is unremarkable. No intrahepatic biliary ductal dilatation. The gallbladder is unremarkable. Pancreas: The pancreas is suboptimally visualized but grossly unremarkable. Spleen: Normal in size without focal abnormality. Adrenals/Urinary Tract: The adrenal glands unremarkable. There is mild left hydronephrosis. There is delayed excretion of contrast by left kidney. The right kidney is unremarkable. The urinary bladder is grossly unremarkable. Stomach/Bowel: There is moderate stool throughout the colon. There is no bowel obstruction. The appendix is not visualized with certainty. No inflammatory changes identified in the right lower quadrant. Vascular/Lymphatic: Mild aortoiliac atherosclerotic disease. The IVC is grossly unremarkable. No portal venous gas. Extensive retroperitoneal  adenopathy encasing the abdominal aorta and iliac arteries. There is diffuse mesenteric and omental implants and caking. Large soft tissue mass in the mesentery encasing the SMA measures approximately 11 x 17 cm in axial dimensions and 15 cm in craniocaudal length. Reproductive: The prostate is grossly unremarkable. Other: Diffuse subcutaneous edema. Small fat containing umbilical hernia. Musculoskeletal: No acute or significant osseous findings. Review of the MIP images confirms the above findings. IMPRESSION: 1. No CT evidence of central pulmonary artery embolus. 2. Small to moderate bilateral pleural effusions with associated partial compressive atelectasis of the lower lobes. Pneumonia is not excluded clinical correlation is recommended. 3. Extensive retroperitoneal, mesenteric, and omental adenopathy in keeping with history of lymphoma. Enlarged lymph nodes in the anterior cardiophrenic space. 4. Moderate ascites. 5. Mild left hydronephrosis. 6. Aortic Atherosclerosis (ICD10-I70.0). Electronically Signed   By: AAnner CreteM.D.   On: 06/24/2020 16:02   NM PET Image Initial (PI) Skull Base To Thigh  Result Date: 06/21/2020 CLINICAL DATA:  Initial treatment strategy for non-Hodgkin lymphoma. EXAM: NUCLEAR MEDICINE PET SKULL BASE TO THIGH TECHNIQUE: 9.5 mCi F-18 FDG was injected intravenously. Full-ring PET imaging was performed from the skull base to thigh after the radiotracer. CT data was obtained and used for attenuation correction and anatomic localization. Fasting blood glucose: 90 mg/dl COMPARISON:  CT abdomen pelvis 06/05/2020,  coronary CT 01/28/2020 and CT chest 11/17/2017. FINDINGS: Mediastinal blood pool activity: SUV max 0.8 Liver activity: SUV max 3.2 NECK: Mild hypermetabolic lymph nodes in the neck are seen with an index left level 2 lymph node measuring approximately 6 mm (4/27) and fall SUV max of 4.0. Incidental CT findings: None. CHEST: Hypermetabolic low internal jugular mediastinal,  internal mammary and axillary lymph nodes. Index low left paratracheal lymph measures 8 mm (4/64) with an SUV max of 6.8. Clustered prepericardiac lymph nodes measure up to 12 mm (4/90) with an SUV max of 10.0. No hypermetabolic pulmonary nodules. Incidental CT findings: Coronary artery calcification. Heart is mildly enlarged. No pericardial effusion. Moderate bilateral pleural effusions with compressive atelectasis in both lower lobes. ABDOMEN/PELVIS: Hypermetabolic adenopathy is seen throughout the abdominal peritoneal ligaments, retroperitoneum and mesenteries. Conglomerate nodal mesenteric mass is difficult to differentiate from small bowel, which may be involved, with an SUV max of 14.4, measuring approximately 8.6 x 12.6 cm (4/137). Index gastrohepatic ligament lymph node measures approximately 2.3 cm (4/110) with an SUV max of 11.4. Extensive hypermetabolic peritoneal/omental thickening/caking, with an index 1.9 x 3.6 cm left omental nodule (4/121), SUV max 11.8. Inguinal lymph nodes measure up to 1.5 cm on the left (4/202) with an SUV max of 1.7. No abnormal hypermetabolism in the liver, adrenal glands, spleen or pancreas. Incidental CT findings: Visualized portion of the liver is grossly unremarkable. There may be sludge in the gallbladder. Adrenal glands and right kidney are grossly unremarkable. There may be a punctate stone in the lower pole left kidney. Left pelvicaliceal fullness versus mild hydronephrosis. Spleen is grossly unremarkable. Bladder appears thick-walled but is under distended. Ascites. SKELETON: No abnormal osseous hypermetabolism. Incidental CT findings: Degenerative changes in the spine. IMPRESSION: 1. Hypermetabolic adenopathy throughout the neck, chest, abdomen and pelvis. Findings are worst in the abdomen, with extensive peritoneal carcinomatosis. Small bowel involvement is not excluded. Findings are consistent with the provided history of lymphoma, Deauville 5. 2. Moderate bilateral  pleural effusions with compressive atelectasis in both lower lobes. 3. Left pelvicaliceal fullness versus mild hydronephrosis. 4. Question punctate left renal stone. Electronically Signed   By: Lorin Picket M.D.   On: 06/21/2020 09:04   IR US Guide Bx Asp/Drain  Result Date: 06/26/2020 INDICATION: Concern for lymphoma. Please perform ultrasound-guided biopsy of hypermetabolic inguinal lymph node for tissue diagnostic purposes Additionally, patient currently admitted with abdominal pain and distension and as such request made for ascites search ultrasound ultrasound-guided paracentesis for diagnostic and therapeutic purposes as indicated. EXAM: 1. ULTRASOUND-GUIDED RIGHT INGUINAL LYMPH NODE BIOPSY 2. ULTRASOUND-GUIDED PARACENTESIS COMPARISON:  CT of the chest, abdomen and pelvis- 06/24/2020; PET-CT-06/21/2020 MEDICATIONS: None. COMPLICATIONS: None immediate. TECHNIQUE: Informed written consent was obtained from the patient after a discussion of the risks, benefits and alternatives to treatment. A timeout was performed prior to the initiation of the procedure. Initial ultrasound scanning demonstrates a small amount of intra-abdominal ascites with dominant pocket located within right upper abdomen adjacent to the hepatic parenchyma. As such, the skin overlying the right upper abdomen was subsequently prepped and draped in the usual sterile fashion. 1% lidocaine with epinephrine was used for local anesthesia. Under direct ultrasound guidance, an 8 Fr Safe-T-Centesis catheter was introduced. The paracentesis was performed. A small amount of aspirated fluid was capped and sent to the laboratory for cytologic analysis as well as triglyceride levels. The catheter was removed and a dressing was applied. Next, attention was paid towards ultrasound-guided biopsy of known hypermetabolic right inguinal lymphadenopathy. Sonographic evaluation  of the right groin demonstrates an at least 2.4 x 0.8 cm right inguinal lymph  node correlating with the dominant right inguinal lymph node seen on preceding abdominal CT image 90, series 2. The procedure was planned. The right groin was prepped and draped in usual sterile fashion. After the overlying soft tissues anesthetized 1% lidocaine with epinephrine, an 18 gauge core needle biopsy device was utilized to obtain 6 core needle biopsy samples. Multiple ultrasound images were saved procedural documentation purposes. Samples were placed in saline and submitted to the laboratory for pathologic analysis. The patient tolerated the above procedures well without immediate postprocedural complication. FINDINGS: A total of approximately 2 liters of chylous fluid was removed. Samples were sent to the laboratory as requested by the clinical team. Successful ultrasound-guided biopsy dominant right inguinal lymph node IMPRESSION: 1. Successful ultrasound-guided biopsy of dominant right inguinal node. 2. Successful ultrasound-guided paracentesis yielding 2 liters of chylous appearing fluid. Fluid was sent to the laboratory for cytologic analysis as well as triglyceride levels. Electronically Signed   By: Sandi Mariscal M.D.   On: 06/26/2020 13:16   DG Chest Port 1 View  Result Date: 06/24/2020 CLINICAL DATA:  Abdominal pain and swelling.  History of lymphoma. EXAM: PORTABLE CHEST 1 VIEW COMPARISON:  Chest radiograph 11/17/2017 FINDINGS: Normal cardiac and mediastinal contours. Small bilateral pleural effusions with underlying consolidation. No pleural effusion or pneumothorax. Osseous structures unremarkable. IMPRESSION: Small bilateral pleural effusions with underlying consolidation. Electronically Signed   By: Lovey Newcomer M.D.   On: 06/24/2020 14:11   ECHOCARDIOGRAM COMPLETE  Result Date: 06/25/2020    ECHOCARDIOGRAM REPORT   Patient Name:   KENNIEL BERGSMA Date of Exam: 06/25/2020 Medical Rec #:  [id number]      Height:       73.0 in Accession #:    [id number]     Weight:       189.0 lb Date of  Birth:  1956-02-07      BSA:          2.101 m Patient Age:    51 years       BP:           136/92 mmHg Patient Gender: M              HR:           120 bpm. Exam Location:  Inpatient Procedure: 2D Echo Indications:    786.09 dyspnea  History:        Patient has prior history of Echocardiogram examinations, most                 recent 08/11/2019. Lymphoma.  Sonographer:    Jannett Celestine RDCS (AE) Referring Phys: [phone] [facility]  Sonographer Comments: Suboptimal parasternal window and suboptimal subcostal window. IMPRESSIONS  1. Left ventricular ejection fraction, by estimation, is 65 to 70%. The left ventricle has normal function. The left ventricle has no regional wall motion abnormalities. Left ventricular diastolic parameters are consistent with Grade I diastolic dysfunction (impaired relaxation).  2. Right ventricular systolic function is hyperdynamic. The right ventricular size is normal.  3. The mitral valve is normal in structure. No evidence of mitral valve regurgitation. No evidence of mitral stenosis.  4. The aortic valve is normal in structure. Aortic valve regurgitation is not visualized. No aortic stenosis is present. FINDINGS  Left Ventricle: Left ventricular ejection fraction, by estimation, is 65 to 70%. The left ventricle has normal function. The left ventricle has no regional wall  motion abnormalities. The left ventricular internal cavity size was normal in size. There is  no left ventricular hypertrophy. Left ventricular diastolic parameters are consistent with Grade I diastolic dysfunction (impaired relaxation). Right Ventricle: The right ventricular size is normal. No increase in right ventricular wall thickness. Right ventricular systolic function is hyperdynamic. Left Atrium: Left atrial size was normal in size. Right Atrium: Right atrial size was normal in size. Pericardium: There is no evidence of pericardial effusion. Mitral Valve: The mitral valve is normal in structure. No evidence  of mitral valve regurgitation. No evidence of mitral valve stenosis. Tricuspid Valve: The tricuspid valve is normal in structure. Tricuspid valve regurgitation is trivial. No evidence of tricuspid stenosis. Aortic Valve: The aortic valve is normal in structure. Aortic valve regurgitation is not visualized. No aortic stenosis is present. Pulmonic Valve: The pulmonic valve was normal in structure. Pulmonic valve regurgitation is not visualized. No evidence of pulmonic stenosis. Aorta: The aortic root is normal in size and structure. Venous: The inferior vena cava was not well visualized. IAS/Shunts: No atrial level shunt detected by color flow Doppler.  LEFT VENTRICLE PLAX 2D LVOT diam:     2.50 cm LV SV:         72 LV SV Index:   34 LVOT Area:     4.91 cm  RIGHT VENTRICLE RV S prime:     18.00 cm/s TAPSE (M-mode): 1.8 cm LEFT ATRIUM             Index       RIGHT ATRIUM           Index LA Vol (A2C):   60.4 ml 28.75 ml/m RA Area:     15.80 cm LA Vol (A4C):   42.5 ml 20.23 ml/m RA Volume:   32.40 ml  15.42 ml/m LA Biplane Vol: 54.0 ml 25.71 ml/m  AORTIC VALVE LVOT Vmax:   95.30 cm/s LVOT Vmean:  74.500 cm/s LVOT VTI:    0.146 m MITRAL VALVE               TRICUSPID VALVE MV Area (PHT): 3.31 cm    TR Peak grad:   13.7 mmHg MV Decel Time: 229 msec    TR Vmax:        185.00 cm/s MV E velocity: 69.40 cm/s MV A velocity: 87.00 cm/s  SHUNTS MV E/A ratio:  0.80        Systemic VTI:  0.15 m                            Systemic Diam: 2.50 cm Skeet Latch MD Electronically signed by Skeet Latch MD Signature Date/Time: 06/25/2020/4:06:39 PM    Final    IR Paracentesis  Result Date: 06/26/2020 INDICATION: Concern for lymphoma. Please perform ultrasound-guided biopsy of hypermetabolic inguinal lymph node for tissue diagnostic purposes Additionally, patient currently admitted with abdominal pain and distension and as such request made for ascites search ultrasound ultrasound-guided paracentesis for diagnostic and  therapeutic purposes as indicated. EXAM: 1. ULTRASOUND-GUIDED RIGHT INGUINAL LYMPH NODE BIOPSY 2. ULTRASOUND-GUIDED PARACENTESIS COMPARISON:  CT of the chest, abdomen and pelvis- 06/24/2020; PET-CT-06/21/2020 MEDICATIONS: None. COMPLICATIONS: None immediate. TECHNIQUE: Informed written consent was obtained from the patient after a discussion of the risks, benefits and alternatives to treatment. A timeout was performed prior to the initiation of the procedure. Initial ultrasound scanning demonstrates a small amount of intra-abdominal ascites with dominant pocket located within right upper abdomen adjacent  to the hepatic parenchyma. As such, the skin overlying the right upper abdomen was subsequently prepped and draped in the usual sterile fashion. 1% lidocaine with epinephrine was used for local anesthesia. Under direct ultrasound guidance, an 8 Fr Safe-T-Centesis catheter was introduced. The paracentesis was performed. A small amount of aspirated fluid was capped and sent to the laboratory for cytologic analysis as well as triglyceride levels. The catheter was removed and a dressing was applied. Next, attention was paid towards ultrasound-guided biopsy of known hypermetabolic right inguinal lymphadenopathy. Sonographic evaluation of the right groin demonstrates an at least 2.4 x 0.8 cm right inguinal lymph node correlating with the dominant right inguinal lymph node seen on preceding abdominal CT image 90, series 2. The procedure was planned. The right groin was prepped and draped in usual sterile fashion. After the overlying soft tissues anesthetized 1% lidocaine with epinephrine, an 18 gauge core needle biopsy device was utilized to obtain 6 core needle biopsy samples. Multiple ultrasound images were saved procedural documentation purposes. Samples were placed in saline and submitted to the laboratory for pathologic analysis. The patient tolerated the above procedures well without immediate postprocedural  complication. FINDINGS: A total of approximately 2 liters of chylous fluid was removed. Samples were sent to the laboratory as requested by the clinical team. Successful ultrasound-guided biopsy dominant right inguinal lymph node IMPRESSION: 1. Successful ultrasound-guided biopsy of dominant right inguinal node. 2. Successful ultrasound-guided paracentesis yielding 2 liters of chylous appearing fluid. Fluid was sent to the laboratory for cytologic analysis as well as triglyceride levels. Electronically Signed   By: Sandi Mariscal M.D.   On: 06/26/2020 13:16    Labs:  CBC: Recent Labs    06/25/20 0305 06/26/20 0326 06/27/20 0742 06/28/20 0311  WBC 4.5 5.1 4.9 5.3  HGB 14.0 14.1 13.4 12.3*  HCT 42.5 43.9 41.1 36.9*  PLT 278 297 311 297    COAGS: Recent Labs    06/24/20 1345 06/25/20 0305  INR 1.0 1.1  APTT 32  --     BMP: Recent Labs    06/15/20 1537 06/24/20 1345 06/26/20 0326 06/27/20 0742 06/28/20 0311 06/29/20 0320  NA 138   < > 134* 135 135 139  K 4.3   < > 4.0 4.6 4.4 4.3  CL 100   < > 96* 98 100 102  CO2 26   < > _0 GLUCOSE 82   < > 93 124* 116* 99  BUN 10   < > _1 CALCIUM 9.3   < > 8.9 8.9 8.5* 8.5*  CREATININE 1.06   < > 1.17 1.17 1.10 0.90  GFRNONAA >60   < > >60 >60 >60 >60  GFRAA >60  --   --   --   --   --    < > = values in this interval not displayed.    LIVER FUNCTION TESTS: Recent Labs    06/24/20 1345 06/25/20 0305 06/27/20 0742 06/29/20 0320  BILITOT 0.9 0.8 0.4 0.4  AST 97* 88* 70* 75*  ALT _2 32  ALKPHOS 126 113 105 86  PROT 6.3* 5.8* 5.7* 5.1*  ALBUMIN 3.5 3.3* 3.2* 3.1*     Assessment and Plan:  Extensive cervical, thoracic, and primarily abdominal pelvic lymphoadenopathy highly suspicious for high-grade lymphoma (pathology pending) with tentative plans to begin systemic chemotherapy during this admission. Plan for image-guided Port-a-cath placement in IR tentatively for tomorrow 06/30/2020 pending IR  scheduling. Patient will  be NPO at midnight. Afebrile. Will hold AM dose of Lovenox per IR protocol.  Risks and benefits of image-guided Port-a-catheter placement were discussed with the patient including, but not limited to bleeding, infection, pneumothorax, or fibrin sheath development and need for additional procedures. All of the patient's questions were answered, patient is agreeable to proceed. Consent signed and in chart.   Thank you for this interesting consult.  I greatly enjoyed meeting Tayler C Faria and look forward to participating in their care.  A copy of this report was sent to the requesting provider on this date.  Electronically Signed: Earley Abide, PA-C 06/29/2020, 12:15 PM   I spent a total of 20 Minutes in face to face in clinical consultation, greater than 50% of which was counseling/coordinating care for probable lymphoma/Port-a-cath placement.

## 2020-06-29 NOTE — Progress Notes (Signed)
[facility]  PROGRESS NOTE  CAPRI RABEN ZWC:[id number] DOB: [birth year] DOA: 06/24/2020 PCP: [name], MD   Brief HPI:   64 year old male with past medical history of anxiety, depression, GERD came to ED with complaints of abdominal pain, dyspnea and orthopnea.  Patient was seen by Dr. [name],  oncology on 06/15/2020 for widespread lymphadenopathy.  Patient underwent PET/CT scan on 06/21/2020 with findings of hypermetabolic lesions throughout the body mostly in the abdominal suspicion for peritoneal carcinomatosis.   Patient came to ED with worsening abdominal pain, and swelling.  He was noted to be tachypneic with tachycardia.  Chest x-ray showed blunting of bilateral costophrenic angles.  CTA chest did not show PE.  Did have small bilateral pleural effusion with partial atelectasis of the lower lobes.   Patient underwent ultrasound-guided biopsy of right inguinal lymph node as well as ultrasound guided paracentesis yielding 2 L of chylous appearing fluid on 06/26/2020.  Biopsy results pending.  Subjective   Patient seen and examined, denies any new complaints.   Assessment/Plan:     1. Abdominal distention-PET/CT scan showed extensive intra-abdominal lymphadenopathy, peritoneal carcinomatosis.  Patient underwent right inguinal lymph node biopsy as well as paracentesis yielding chylous fluid.  Peritoneal fluid analysis shows non-Hodgkin's B-cell lymphoma.  Right inguinal tissue biopsy specimen was insufficient for analysis.  Continue high-dose prednisone 80 mg daily.  Oncology following.  Oncology is waiting for final IHC.  Patient might need to be started on chemotherapy if he is found to have high-grade lymphoma. 2. Generalized lymphadenopathy-likely due to lymphoma as above.  Hepatitis B and C were negative.  HIV was negative. 3. Peripheral edema-likely from anasarca , albumin is 3.2.  Also could be from widespread lymphadenopathy. 4. Dyspnea-improved, chest x-ray showed mild to  moderate pleural effusion.  He underwent paracentesis which improved dyspnea.    Echocardiogram showed EF 60 to 70% with grade 1 diastolic dysfunction.  Patient did receive 1 dose of Lasix.  He is currently on 1 L/min  oxygen via nasal cannula. 5. ?  Tumor lysis syndrome-uric acid level elevated at 13.1.  Patient started on allopurinol.  Uric acid has come down to 10.3.     COVID-19 Labs  Recent Labs    06/27/20 0742  LDH 639*    Lab Results  Component Value Date   SARSCOV2NAA NEGATIVE 06/24/2020     Scheduled medications:   . allopurinol  100 mg Oral BID  . enoxaparin (LOVENOX) injection  40 mg Subcutaneous Q24H  . pantoprazole  40 mg Oral Daily  . predniSONE  80 mg Oral Q breakfast  . senna  1 tablet Oral BID         CBG: No results for input(s): GLUCAP in the last 168 hours.  SpO2: 94 % O2 Flow Rate (L/min): 1 L/min    CBC: Recent Labs  Lab 06/24/20 1345 06/25/20 0305 06/26/20 0326 06/27/20 0742 06/28/20 0311  WBC 5.4 4.5 5.1 4.9 5.3  NEUTROABS 4.4  --   --  4.3  --   HGB 14.8 14.0 14.1 13.4 12.3*  HCT 44.4 42.5 43.9 41.1 36.9*  MCV 85.5 86.6 87.6 85.4 84.8  PLT 303 278 297 311 235    Basic Metabolic Panel: Recent Labs  Lab 06/25/20 0305 06/26/20 0326 06/27/20 0742 06/28/20 0311 06/29/20 0320  NA 134* 134* 135 135 139  K 4.5 4.0 4.6 4.4 4.3  CL 99 96* 98 100 102  CO2 24 25 26 26 27   GLUCOSE 91 93 124* 116*  99  BUN 13 13 23 23 19   CREATININE 1.05 1.17 1.17 1.10 0.90  CALCIUM 8.7* 8.9 8.9 8.5* 8.5*  MG 2.0 1.9  --  2.3  --   PHOS 4.2  --   --   --   --      Liver Function Tests: Recent Labs  Lab 06/24/20 1345 06/25/20 0305 06/27/20 0742 06/29/20 0320  AST 97* 88* 70* 75*  ALT 30 29 25  32  ALKPHOS 126 113 105 86  BILITOT 0.9 0.8 0.4 0.4  PROT 6.3* 5.8* 5.7* 5.1*  ALBUMIN 3.5 3.3* 3.2* 3.1*     Antibiotics: Anti-infectives (From admission, onward)   None       DVT prophylaxis: Lovenox  Code Status: Full code  Family  Communication: No family at bedside    Status is: Inpatient  Dispo: The patient is from: Home              Anticipated d/c is to: Home              Anticipated d/c date is: 06/30/2020              Patient currently not medically stable for discharge  Barrier to discharge-ongoing work-up for lymphoma      Consultants:  Oncology  Procedures:  Right inguinal lymph node biopsy, ultrasound-guided paracentesis   Objective   Vitals:   06/28/20 1329 06/28/20 2117 06/29/20 0532 06/29/20 1352  BP: (!) 130/91 (!) 140/97 125/90 136/86  Pulse: (!) 108 100 96 98  Resp: 19 16 14 18   Temp: 98.5 F (36.9 C) 97.7 F (36.5 C) 98.5 F (36.9 C) 98.1 F (36.7 C)  TempSrc: Oral Oral Oral Oral  SpO2: 92% 94% 93% 94%  Weight:      Height:        Intake/Output Summary (Last 24 hours) at 06/29/2020 1405 Last data filed at 06/29/2020 0263 Gross per 24 hour  Intake 900 ml  Output 550 ml  Net 350 ml    10/12 1901 - 10/14 0700 In: 500 [P.O.:500] Out: 1575 [Urine:1575]  Filed Weights   06/24/20 1226  Weight: 85.7 kg    Physical Examination:    General-appears in no acute distress  Heart-S1-S2, regular, no murmur auscultated  Lungs-clear to auscultation bilaterally, no wheezing or crackles auscultated  Abdomen-soft, nontender, no organomegaly  Extremities-bilateral trace edema in the lower extremities  Neuro-alert, oriented x3, no focal deficit noted    Data Reviewed:   Recent Results (from the past 240 hour(s))  Blood culture (routine single)     Status: None   Collection Time: 06/24/20  1:45 PM   Specimen: BLOOD  Result Value Ref Range Status   Specimen Description   Final    BLOOD Performed at [facility], [address], [place], [place] [address]    Special Requests   Final    NONE Performed at [facility], [place] [address], [place], [place] [address]    Culture   Final    NO GROWTH 5 DAYS Performed at [facility], [place] [address], [place], [place] [address]    Report Status 06/29/2020 FINAL  Final  Respiratory Panel by RT PCR (Flu A&B, Covid) - Nasopharyngeal Swab     Status: None   Collection Time: 06/24/20  1:45 PM   Specimen: Nasopharyngeal Swab  Result Value Ref Range Status   SARS Coronavirus 2 by RT PCR NEGATIVE NEGATIVE Final    Comment: (NOTE)  SARS-CoV-2 target nucleic acids are NOT DETECTED.  The SARS-CoV-2 RNA is generally detectable in upper respiratoy specimens during the acute phase of infection. The lowest concentration of SARS-CoV-2 viral copies this assay can detect is 131 copies/mL. A negative result does not preclude SARS-Cov-2 infection and should not be used as the sole basis for treatment or other patient management decisions. A negative result may occur with  improper specimen collection/handling, submission of specimen other than nasopharyngeal swab, presence of viral mutation(s) within the areas targeted by this assay, and inadequate number of viral copies (<131 copies/mL). A negative result must be combined with clinical observations, patient history, and epidemiological information. The expected result is Negative.  Fact Sheet for Patients:  PinkCheek.be  Fact Sheet for Healthcare Providers:  GravelBags.it  This test is no t yet approved or cleared by the [place] FDA and  has been authorized for detection and/or diagnosis of SARS-CoV-2 by FDA under an Emergency Use Authorization (EUA). This EUA will remain  in effect (meaning this test can be used) for the duration of the COVID-19 declaration under Section 564(b)(1) of the Act, 21 U.S.C. section 360bbb-3(b)(1), unless the authorization is terminated or revoked sooner.     Influenza A by PCR NEGATIVE NEGATIVE Final   Influenza B by PCR NEGATIVE NEGATIVE Final    Comment: (NOTE) The Xpert Xpress SARS-CoV-2/FLU/RSV assay is intended as an aid  in  the diagnosis of influenza from Nasopharyngeal swab specimens and  should not be used as a sole basis for treatment. Nasal washings and  aspirates are unacceptable for Xpert Xpress SARS-CoV-2/FLU/RSV  testing.  Fact Sheet for Patients: PinkCheek.be  Fact Sheet for Healthcare Providers: GravelBags.it  This test is not yet approved or cleared by the [place] FDA and  has been authorized for detection and/or diagnosis of SARS-CoV-2 by  FDA under an Emergency Use Authorization (EUA). This EUA will remain  in effect (meaning this test can be used) for the duration of the  Covid-19 declaration under Section 564(b)(1) of the Act, 21  U.S.C. section 360bbb-3(b)(1), unless the authorization is  terminated or revoked. Performed at [facility], [place] [address], [place], [place] [address]   Urine culture     Status: None   Collection Time: 06/24/20  5:28 PM   Specimen: In/Out Cath Urine  Result Value Ref Range Status   Specimen Description   Final    IN/OUT CATH URINE Performed at [facility] [address], [place], [place] [address]    Special Requests   Final    NONE Performed at [facility], [place] [address], [place], [place] [address]    Culture   Final    NO GROWTH Performed at [facility], [place] [address] [place], [place] [address]    Report Status 06/26/2020 FINAL  Final  Body fluid culture     Status: None   Collection Time: 06/26/20 11:40 AM   Specimen: PATH Cytology Peritoneal fluid  Result Value Ref Range Status   Specimen Description   Final    PERITONEAL Performed at [facility] [address] [place], [place] [address]    Special Requests   Final    NONE Performed at [facility], [place] [address], [place], [place] [address]    Gram Stain   Final    RARE WBC PRESENT, PREDOMINANTLY  MONONUCLEAR NO ORGANISMS SEEN    Culture   Final    NO  GROWTH 3 DAYS Performed at [facility], [place] [address], [place], [place] [address]    Report Status 06/29/2020 FINAL  Final    No results for input(s): LIPASE, AMYLASE in the last 168 hours. No results for input(s): AMMONIA in the last 168 hours.  Cardiac Enzymes: No results for input(s): CKTOTAL, CKMB, CKMBINDEX, TROPONINI in the last 168 hours. BNP (last 3 results) Recent Labs    06/25/20 0300  BNP 29.7        [place]   [facility] If 7PM-7AM, please contact night-coverage at [email or web address], Office  [phone]   06/29/2020, 2:05 PM  LOS: 5 days

## 2020-06-29 NOTE — Progress Notes (Signed)
IR consulted by Mikey Bussing, NP for possible image-guided paracentesis.  Limited abdominal US revealed no fluid that could be safely accessed with procedure today. Of note, patient had recent image-guided paracentesis in IR 06/26/2020 yielding 2L. Images sent to Dr. Kathlene Cote for review. Informed patient that procedure will not occur today. All questions answered and concerns addressed. Patient conveys understanding and agrees with plan.  IR available in future if needed.   Bea Graff Valera Vallas, PA-C 06/29/2020, 12:10 PM

## 2020-06-29 NOTE — Progress Notes (Signed)
Right lower extremity venous duplex completed. Refer to "CV Proc" under chart review to view preliminary results.  06/29/2020 3:51 PM Kelby Aline., MHA, RVT, RDCS, RDMS

## 2020-06-29 NOTE — Progress Notes (Addendum)
George Proctor   HEMATOLOGY/ONCOLOGY INPATIENT PROGRESS NOTE  Date of Service: 06/29/2020  Inpatient Attending: .Oswald Hillock, MD   SUBJECTIVE  George Proctor was admitted on 06/24/2020 with abdominal pain abdominal distention and shortness of breath and was noted to have some mild hypoxia.  Work-up ruled out a PE and acute coronary artery disease.  Echocardiogram has shown normal ejection fraction. He had a PET scan as outpatient on 06/21/2020 as a part of his initial work-up for suspected lymphoma which shows hypermetabolic adenopathy throughout the neck chest abdomen and pelvis with most prominent findings in the abdomen with extensive peritoneal carcinomatosis.  He also has developed some moderate bilateral pleural effusions.  The patient underwent ultrasound-guided biopsy of the right inguinal lymph node as well as an ultrasound-guided paracentesis yielding 2 L of chylous appearing fluid on 06/26/2020.  He had improvement of his breathing abdominal distention following the procedure.  He was started on high-dose prednisone at 80 mg daily following the biopsy.  He was also started on allopurinol due to an elevated uric acid level and concern for tumor lysis syndrome.  Today, he reports more abdominal distention and some mild shortness of breath.  Feels abdomen is starting to fill back up.  He also notices that his right lower extremity is more edematous than the left.  He also states that his lymph nodes in his groin are noticeably larger.  OBJECTIVE:  NAD  PHYSICAL EXAMINATION: . Vitals:   06/28/20 0731 06/28/20 1329 06/28/20 2117 06/29/20 0532  BP:  (!) 130/91 (!) 140/97 125/90  Pulse: (!) 104 (!) 108 100 96  Resp:  _0 Temp:  98.5 F (36.9 C) 97.7 F (36.5 C) 98.5 F (36.9 C)  TempSrc:  Oral Oral Oral  SpO2: 92% 92% 94% 93%  Weight:      Height:       Filed Weights   06/24/20 1226  Weight: 85.7 kg   .Body mass index is 24.94 kg/m.  GENERAL:alert, in no acute distress and  comfortable SKIN: skin color, texture, turgor are normal, no rashes or significant lesions EYES: normal, conjunctiva are pink and non-injected, sclera clear OROPHARYNX:no exudate, no erythema and lips, buccal mucosa, and tongue normal  NECK: supple, no JVD, thyroid normal size, non-tender, without nodularity LYMPH: Palpable inguinal adenopathy LUNGS: clear to auscultation with normal respiratory effort HEART: regular rate & rhythm,  no murmurs and no lower extremity edema ABDOMEN: abdomen distended with inverted umbilicus.  Mild tenderness to palpation over central abdomen. Musculoskeletal: 2+ bilateral lower extremity edema, right greater than left PSYCH: alert & oriented x 3 with fluent speech NEURO: no focal motor/sensory deficits  MEDICAL HISTORY:  Past Medical History:  Diagnosis Date  . Anxiety   . Depression   . Gait difficulty   . Lymphoma ([place])   . Memory loss     SURGICAL HISTORY: Past Surgical History:  Procedure Laterality Date  . ABDOMINAL SURGERY     Childhood  . BUNIONECTOMY    . IR PARACENTESIS  06/26/2020  . IR US GUIDE BX ASP/DRAIN  06/26/2020    SOCIAL HISTORY: Social History   Socioeconomic History  . Marital status: Married    Spouse name: Not on file  . Number of children: 0  . Years of education: Veterinary surgeon  . Highest education level: Not on file  Occupational History  . Occupation: Scientist, research (physical sciences)  Tobacco Use  . Smoking status: Never Smoker  . Smokeless tobacco: Never Used  Vaping  Use  . Vaping Use: Never used  Substance and Sexual Activity  . Alcohol use: No    Alcohol/week: 0.0 standard drinks    Comment: Less than one drink per week.  . Drug use: No  . Sexual activity: Not Currently  Other Topics Concern  . Not on file  Social History Narrative   Lives at home with wife.   Right-handed.   3 cups caffeine per day.   Social Determinants of Health   Financial Resource Strain:   . Difficulty of Paying Living  Expenses: Not on file  Food Insecurity:   . Worried About Charity fundraiser in the Last Year: Not on file  . Ran Out of Food in the Last Year: Not on file  Transportation Needs:   . Lack of Transportation (Medical): Not on file  . Lack of Transportation (Non-Medical): Not on file  Physical Activity:   . Days of Exercise per Week: Not on file  . Minutes of Exercise per Session: Not on file  Stress:   . Feeling of Stress : Not on file  Social Connections:   . Frequency of Communication with Friends and Family: Not on file  . Frequency of Social Gatherings with Friends and Family: Not on file  . Attends Religious Services: Not on file  . Active Member of Clubs or Organizations: Not on file  . Attends Archivist Meetings: Not on file  . Marital Status: Not on file  Intimate Partner Violence:   . Fear of Current or Ex-Partner: Not on file  . Emotionally Abused: Not on file  . Physically Abused: Not on file  . Sexually Abused: Not on file    FAMILY HISTORY: Family History  Problem Relation Age of Onset  . Aneurysm Mother 79       Thoracic  . Hypertension Father   . Heart failure Father   . Heart disease Father   . Heart attack Paternal Grandfather   . Depression Cousin   . Bipolar disorder Cousin   . Schizophrenia Maternal Uncle     ALLERGIES:  is allergic to omeprazole.  MEDICATIONS:  Scheduled Meds: . allopurinol  100 mg Oral BID  . enoxaparin (LOVENOX) injection  40 mg Subcutaneous Q24H  . pantoprazole  40 mg Oral Daily  . predniSONE  80 mg Oral Q breakfast  . senna  1 tablet Oral BID   Continuous Infusions: PRN Meds:.acetaminophen **OR** acetaminophen, albuterol, alum & mag hydroxide-simeth, bisacodyl, HYDROmorphone (DILAUDID) injection, LORazepam, ondansetron (ZOFRAN) IV, oxyCODONE-acetaminophen, polyethylene glycol  REVIEW OF SYSTEMS:    10 Point review of Systems was done is negative except as noted above.   LABORATORY DATA:  I have reviewed the  data as listed  . CBC Latest Ref Rng & Units 06/28/2020 06/27/2020 06/26/2020  WBC 4.0 - 10.5 K/uL 5.3 4.9 5.1  Hemoglobin 13.0 - 17.0 g/dL 12.3(L) 13.4 14.1  Hematocrit 39 - 52 % 36.9(L) 41.1 43.9  Platelets 150 - 400 K/uL 297 311 297    . CMP Latest Ref Rng & Units 06/29/2020 06/28/2020 06/27/2020  Glucose 70 - 99 mg/dL 99 116(H) 124(H)  BUN 8 - 23 mg/dL _0 Creatinine 0.61 - 1.24 mg/dL 0.90 1.10 1.17  Sodium 135 - 145 mmol/L 139 135 135  Potassium 3.5 - 5.1 mmol/L 4.3 4.4 4.6  Chloride 98 - 111 mmol/L 102 100 98  CO2 22 - 32 mmol/L _1 Calcium 8.9 - 10.3 mg/dL 8.5(L) 8.5(L) 8.9  Total Protein 6.5 - 8.1 g/dL 5.1(L) - 5.7(L)  Total Bilirubin 0.3 - 1.2 mg/dL 0.4 - 0.4  Alkaline Phos 38 - 126 U/L 86 - 105  AST 15 - 41 U/L 75(H) - 70(H)  ALT 0 - 44 U/L 32 - 25     RADIOGRAPHIC STUDIES: I have personally reviewed the radiological images as listed and agreed with the findings in the report. CT Angio Chest PE W and/or Wo Contrast  Result Date: 06/24/2020 CLINICAL DATA:  64 year old male with recent diagnosis of lymphoma presenting with shortness of breath. Concern for pulmonary embolism. EXAM: CT ANGIOGRAPHY CHEST CT ABDOMEN AND PELVIS WITH CONTRAST TECHNIQUE: Multidetector CT imaging of the chest was performed using the standard protocol during bolus administration of intravenous contrast. Multiplanar CT image reconstructions and MIPs were obtained to evaluate the vascular anatomy. Multidetector CT imaging of the abdomen and pelvis was performed using the standard protocol during bolus administration of intravenous contrast. CONTRAST:  114m OMNIPAQUE IOHEXOL 350 MG/ML SOLN COMPARISON:  Chest radiograph dated 06/24/2020. FINDINGS: Evaluation of this exam is limited due to respiratory motion artifact. CTA CHEST FINDINGS Cardiovascular: There is no cardiomegaly or pericardial effusion. The thoracic aorta is unremarkable. Evaluation of the pulmonary arteries is limited due to  respiratory motion artifact and suboptimal opacification and timing of the contrast. No large or central pulmonary artery embolus identified. Mediastinum/Nodes: There is no hilar adenopathy. Enlarged lymph nodes noted anterior to the heart and in the anterior cardiophrenic space. The esophagus and the thyroid gland are grossly unremarkable. No mediastinal fluid collection. Lungs/Pleura: Small to moderate bilateral pleural effusions with associated partial compressive atelectasis the lower lobes. Pneumonia is not excluded clinical correlation is recommended. There is no pneumothorax. The central airways are patent. Musculoskeletal: No chest wall abnormality. No acute or significant osseous findings. Review of the MIP images confirms the above findings. CT ABDOMEN and PELVIS FINDINGS No intra-abdominal free air. There is moderate ascites. Hepatobiliary: The liver is unremarkable. No intrahepatic biliary ductal dilatation. The gallbladder is unremarkable. Pancreas: The pancreas is suboptimally visualized but grossly unremarkable. Spleen: Normal in size without focal abnormality. Adrenals/Urinary Tract: The adrenal glands unremarkable. There is mild left hydronephrosis. There is delayed excretion of contrast by left kidney. The right kidney is unremarkable. The urinary bladder is grossly unremarkable. Stomach/Bowel: There is moderate stool throughout the colon. There is no bowel obstruction. The appendix is not visualized with certainty. No inflammatory changes identified in the right lower quadrant. Vascular/Lymphatic: Mild aortoiliac atherosclerotic disease. The IVC is grossly unremarkable. No portal venous gas. Extensive retroperitoneal adenopathy encasing the abdominal aorta and iliac arteries. There is diffuse mesenteric and omental implants and caking. Large soft tissue mass in the mesentery encasing the SMA measures approximately 11 x 17 cm in axial dimensions and 15 cm in craniocaudal length. Reproductive: The  prostate is grossly unremarkable. Other: Diffuse subcutaneous edema. Small fat containing umbilical hernia. Musculoskeletal: No acute or significant osseous findings. Review of the MIP images confirms the above findings. IMPRESSION: 1. No CT evidence of central pulmonary artery embolus. 2. Small to moderate bilateral pleural effusions with associated partial compressive atelectasis of the lower lobes. Pneumonia is not excluded clinical correlation is recommended. 3. Extensive retroperitoneal, mesenteric, and omental adenopathy in keeping with history of lymphoma. Enlarged lymph nodes in the anterior cardiophrenic space. 4. Moderate ascites. 5. Mild left hydronephrosis. 6. Aortic Atherosclerosis (ICD10-I70.0). Electronically Signed   By: AAnner CreteM.D.   On: 06/24/2020 16:02   CT ABDOMEN PELVIS W CONTRAST  Result Date: 06/24/2020 CLINICAL DATA:  64 year old male with recent diagnosis of lymphoma presenting with shortness of breath. Concern for pulmonary embolism. EXAM: CT ANGIOGRAPHY CHEST CT ABDOMEN AND PELVIS WITH CONTRAST TECHNIQUE: Multidetector CT imaging of the chest was performed using the standard protocol during bolus administration of intravenous contrast. Multiplanar CT image reconstructions and MIPs were obtained to evaluate the vascular anatomy. Multidetector CT imaging of the abdomen and pelvis was performed using the standard protocol during bolus administration of intravenous contrast. CONTRAST:  124m OMNIPAQUE IOHEXOL 350 MG/ML SOLN COMPARISON:  Chest radiograph dated 06/24/2020. FINDINGS: Evaluation of this exam is limited due to respiratory motion artifact. CTA CHEST FINDINGS Cardiovascular: There is no cardiomegaly or pericardial effusion. The thoracic aorta is unremarkable. Evaluation of the pulmonary arteries is limited due to respiratory motion artifact and suboptimal opacification and timing of the contrast. No large or central pulmonary artery embolus identified.  Mediastinum/Nodes: There is no hilar adenopathy. Enlarged lymph nodes noted anterior to the heart and in the anterior cardiophrenic space. The esophagus and the thyroid gland are grossly unremarkable. No mediastinal fluid collection. Lungs/Pleura: Small to moderate bilateral pleural effusions with associated partial compressive atelectasis the lower lobes. Pneumonia is not excluded clinical correlation is recommended. There is no pneumothorax. The central airways are patent. Musculoskeletal: No chest wall abnormality. No acute or significant osseous findings. Review of the MIP images confirms the above findings. CT ABDOMEN and PELVIS FINDINGS No intra-abdominal free air. There is moderate ascites. Hepatobiliary: The liver is unremarkable. No intrahepatic biliary ductal dilatation. The gallbladder is unremarkable. Pancreas: The pancreas is suboptimally visualized but grossly unremarkable. Spleen: Normal in size without focal abnormality. Adrenals/Urinary Tract: The adrenal glands unremarkable. There is mild left hydronephrosis. There is delayed excretion of contrast by left kidney. The right kidney is unremarkable. The urinary bladder is grossly unremarkable. Stomach/Bowel: There is moderate stool throughout the colon. There is no bowel obstruction. The appendix is not visualized with certainty. No inflammatory changes identified in the right lower quadrant. Vascular/Lymphatic: Mild aortoiliac atherosclerotic disease. The IVC is grossly unremarkable. No portal venous gas. Extensive retroperitoneal adenopathy encasing the abdominal aorta and iliac arteries. There is diffuse mesenteric and omental implants and caking. Large soft tissue mass in the mesentery encasing the SMA measures approximately 11 x 17 cm in axial dimensions and 15 cm in craniocaudal length. Reproductive: The prostate is grossly unremarkable. Other: Diffuse subcutaneous edema. Small fat containing umbilical hernia. Musculoskeletal: No acute or  significant osseous findings. Review of the MIP images confirms the above findings. IMPRESSION: 1. No CT evidence of central pulmonary artery embolus. 2. Small to moderate bilateral pleural effusions with associated partial compressive atelectasis of the lower lobes. Pneumonia is not excluded clinical correlation is recommended. 3. Extensive retroperitoneal, mesenteric, and omental adenopathy in keeping with history of lymphoma. Enlarged lymph nodes in the anterior cardiophrenic space. 4. Moderate ascites. 5. Mild left hydronephrosis. 6. Aortic Atherosclerosis (ICD10-I70.0). Electronically Signed   By: AAnner CreteM.D.   On: 06/24/2020 16:02   NM PET Image Initial (PI) Skull Base To Thigh  Result Date: 06/21/2020 CLINICAL DATA:  Initial treatment strategy for non-Hodgkin lymphoma. EXAM: NUCLEAR MEDICINE PET SKULL BASE TO THIGH TECHNIQUE: 9.5 mCi F-18 FDG was injected intravenously. Full-ring PET imaging was performed from the skull base to thigh after the radiotracer. CT data was obtained and used for attenuation correction and anatomic localization. Fasting blood glucose: 90 mg/dl COMPARISON:  CT abdomen pelvis 06/05/2020, coronary CT 01/28/2020 and CT chest 11/17/2017. FINDINGS: Mediastinal blood  pool activity: SUV max 0.8 Liver activity: SUV max 3.2 NECK: Mild hypermetabolic lymph nodes in the neck are seen with an index left level 2 lymph node measuring approximately 6 mm (4/27) and fall SUV max of 4.0. Incidental CT findings: None. CHEST: Hypermetabolic low internal jugular mediastinal, internal mammary and axillary lymph nodes. Index low left paratracheal lymph measures 8 mm (4/64) with an SUV max of 6.8. Clustered prepericardiac lymph nodes measure up to 12 mm (4/90) with an SUV max of 10.0. No hypermetabolic pulmonary nodules. Incidental CT findings: Coronary artery calcification. Heart is mildly enlarged. No pericardial effusion. Moderate bilateral pleural effusions with compressive atelectasis in  both lower lobes. ABDOMEN/PELVIS: Hypermetabolic adenopathy is seen throughout the abdominal peritoneal ligaments, retroperitoneum and mesenteries. Conglomerate nodal mesenteric mass is difficult to differentiate from small bowel, which may be involved, with an SUV max of 14.4, measuring approximately 8.6 x 12.6 cm (4/137). Index gastrohepatic ligament lymph node measures approximately 2.3 cm (4/110) with an SUV max of 11.4. Extensive hypermetabolic peritoneal/omental thickening/caking, with an index 1.9 x 3.6 cm left omental nodule (4/121), SUV max 11.8. Inguinal lymph nodes measure up to 1.5 cm on the left (4/202) with an SUV max of 1.7. No abnormal hypermetabolism in the liver, adrenal glands, spleen or pancreas. Incidental CT findings: Visualized portion of the liver is grossly unremarkable. There may be sludge in the gallbladder. Adrenal glands and right kidney are grossly unremarkable. There may be a punctate stone in the lower pole left kidney. Left pelvicaliceal fullness versus mild hydronephrosis. Spleen is grossly unremarkable. Bladder appears thick-walled but is under distended. Ascites. SKELETON: No abnormal osseous hypermetabolism. Incidental CT findings: Degenerative changes in the spine. IMPRESSION: 1. Hypermetabolic adenopathy throughout the neck, chest, abdomen and pelvis. Findings are worst in the abdomen, with extensive peritoneal carcinomatosis. Small bowel involvement is not excluded. Findings are consistent with the provided history of lymphoma, [place] 5. 2. Moderate bilateral pleural effusions with compressive atelectasis in both lower lobes. 3. Left pelvicaliceal fullness versus mild hydronephrosis. 4. Question punctate left renal stone. Electronically Signed   By: Lorin Picket M.D.   On: 06/21/2020 09:04   IR US Guide Bx Asp/Drain  Result Date: 06/26/2020 INDICATION: Concern for lymphoma. Please perform ultrasound-guided biopsy of hypermetabolic inguinal lymph node for tissue  diagnostic purposes Additionally, patient currently admitted with abdominal pain and distension and as such request made for ascites search ultrasound ultrasound-guided paracentesis for diagnostic and therapeutic purposes as indicated. EXAM: 1. ULTRASOUND-GUIDED RIGHT INGUINAL LYMPH NODE BIOPSY 2. ULTRASOUND-GUIDED PARACENTESIS COMPARISON:  CT of the chest, abdomen and pelvis- 06/24/2020; PET-CT-06/21/2020 MEDICATIONS: None. COMPLICATIONS: None immediate. TECHNIQUE: Informed written consent was obtained from the patient after a discussion of the risks, benefits and alternatives to treatment. A timeout was performed prior to the initiation of the procedure. Initial ultrasound scanning demonstrates a small amount of intra-abdominal ascites with dominant pocket located within right upper abdomen adjacent to the hepatic parenchyma. As such, the skin overlying the right upper abdomen was subsequently prepped and draped in the usual sterile fashion. 1% lidocaine with epinephrine was used for local anesthesia. Under direct ultrasound guidance, an 8 Fr Safe-T-Centesis catheter was introduced. The paracentesis was performed. A small amount of aspirated fluid was capped and sent to the laboratory for cytologic analysis as well as triglyceride levels. The catheter was removed and a dressing was applied. Next, attention was paid towards ultrasound-guided biopsy of known hypermetabolic right inguinal lymphadenopathy. Sonographic evaluation of the right groin demonstrates an at least 2.4 x  0.8 cm right inguinal lymph node correlating with the dominant right inguinal lymph node seen on preceding abdominal CT image 90, series 2. The procedure was planned. The right groin was prepped and draped in usual sterile fashion. After the overlying soft tissues anesthetized 1% lidocaine with epinephrine, an 18 gauge core needle biopsy device was utilized to obtain 6 core needle biopsy samples. Multiple ultrasound images were saved procedural  documentation purposes. Samples were placed in saline and submitted to the laboratory for pathologic analysis. The patient tolerated the above procedures well without immediate postprocedural complication. FINDINGS: A total of approximately 2 liters of chylous fluid was removed. Samples were sent to the laboratory as requested by the clinical team. Successful ultrasound-guided biopsy dominant right inguinal lymph node IMPRESSION: 1. Successful ultrasound-guided biopsy of dominant right inguinal node. 2. Successful ultrasound-guided paracentesis yielding 2 liters of chylous appearing fluid. Fluid was sent to the laboratory for cytologic analysis as well as triglyceride levels. Electronically Signed   By: Sandi Mariscal M.D.   On: 06/26/2020 13:16   DG Chest Port 1 View  Result Date: 06/24/2020 CLINICAL DATA:  Abdominal pain and swelling.  History of lymphoma. EXAM: PORTABLE CHEST 1 VIEW COMPARISON:  Chest radiograph 11/17/2017 FINDINGS: Normal cardiac and mediastinal contours. Small bilateral pleural effusions with underlying consolidation. No pleural effusion or pneumothorax. Osseous structures unremarkable. IMPRESSION: Small bilateral pleural effusions with underlying consolidation. Electronically Signed   By: Lovey Newcomer M.D.   On: 06/24/2020 14:11   ECHOCARDIOGRAM COMPLETE  Result Date: 06/25/2020    ECHOCARDIOGRAM REPORT   Patient Name:   TYTON ABDALLAH Date of Exam: 06/25/2020 Medical Rec #:  [id number]      Height:       73.0 in Accession #:    [id number]     Weight:       189.0 lb Date of Birth:  02-02-1956      BSA:          2.101 m Patient Age:    11 years       BP:           136/92 mmHg Patient Gender: M              HR:           120 bpm. Exam Location:  Inpatient Procedure: 2D Echo Indications:    786.09 dyspnea  History:        Patient has prior history of Echocardiogram examinations, most                 recent 08/11/2019. Lymphoma.  Sonographer:    Jannett Celestine RDCS (AE) Referring Phys: [phone]  [facility]  Sonographer Comments: Suboptimal parasternal window and suboptimal subcostal window. IMPRESSIONS  1. Left ventricular ejection fraction, by estimation, is 65 to 70%. The left ventricle has normal function. The left ventricle has no regional wall motion abnormalities. Left ventricular diastolic parameters are consistent with Grade I diastolic dysfunction (impaired relaxation).  2. Right ventricular systolic function is hyperdynamic. The right ventricular size is normal.  3. The mitral valve is normal in structure. No evidence of mitral valve regurgitation. No evidence of mitral stenosis.  4. The aortic valve is normal in structure. Aortic valve regurgitation is not visualized. No aortic stenosis is present. FINDINGS  Left Ventricle: Left ventricular ejection fraction, by estimation, is 65 to 70%. The left ventricle has normal function. The left ventricle has no regional wall motion abnormalities. The left ventricular internal cavity size was normal  in size. There is  no left ventricular hypertrophy. Left ventricular diastolic parameters are consistent with Grade I diastolic dysfunction (impaired relaxation). Right Ventricle: The right ventricular size is normal. No increase in right ventricular wall thickness. Right ventricular systolic function is hyperdynamic. Left Atrium: Left atrial size was normal in size. Right Atrium: Right atrial size was normal in size. Pericardium: There is no evidence of pericardial effusion. Mitral Valve: The mitral valve is normal in structure. No evidence of mitral valve regurgitation. No evidence of mitral valve stenosis. Tricuspid Valve: The tricuspid valve is normal in structure. Tricuspid valve regurgitation is trivial. No evidence of tricuspid stenosis. Aortic Valve: The aortic valve is normal in structure. Aortic valve regurgitation is not visualized. No aortic stenosis is present. Pulmonic Valve: The pulmonic valve was normal in structure. Pulmonic valve  regurgitation is not visualized. No evidence of pulmonic stenosis. Aorta: The aortic root is normal in size and structure. Venous: The inferior vena cava was not well visualized. IAS/Shunts: No atrial level shunt detected by color flow Doppler.  LEFT VENTRICLE PLAX 2D LVOT diam:     2.50 cm LV SV:         72 LV SV Index:   34 LVOT Area:     4.91 cm  RIGHT VENTRICLE RV S prime:     18.00 cm/s TAPSE (M-mode): 1.8 cm LEFT ATRIUM             Index       RIGHT ATRIUM           Index LA Vol (A2C):   60.4 ml 28.75 ml/m RA Area:     15.80 cm LA Vol (A4C):   42.5 ml 20.23 ml/m RA Volume:   32.40 ml  15.42 ml/m LA Biplane Vol: 54.0 ml 25.71 ml/m  AORTIC VALVE LVOT Vmax:   95.30 cm/s LVOT Vmean:  74.500 cm/s LVOT VTI:    0.146 m MITRAL VALVE               TRICUSPID VALVE MV Area (PHT): 3.31 cm    TR Peak grad:   13.7 mmHg MV Decel Time: 229 msec    TR Vmax:        185.00 cm/s MV E velocity: 69.40 cm/s MV A velocity: 87.00 cm/s  SHUNTS MV E/A ratio:  0.80        Systemic VTI:  0.15 m                            Systemic Diam: 2.50 cm Skeet Latch MD Electronically signed by Skeet Latch MD Signature Date/Time: 06/25/2020/4:06:39 PM    Final    IR Paracentesis  Result Date: 06/26/2020 INDICATION: Concern for lymphoma. Please perform ultrasound-guided biopsy of hypermetabolic inguinal lymph node for tissue diagnostic purposes Additionally, patient currently admitted with abdominal pain and distension and as such request made for ascites search ultrasound ultrasound-guided paracentesis for diagnostic and therapeutic purposes as indicated. EXAM: 1. ULTRASOUND-GUIDED RIGHT INGUINAL LYMPH NODE BIOPSY 2. ULTRASOUND-GUIDED PARACENTESIS COMPARISON:  CT of the chest, abdomen and pelvis- 06/24/2020; PET-CT-06/21/2020 MEDICATIONS: None. COMPLICATIONS: None immediate. TECHNIQUE: Informed written consent was obtained from the patient after a discussion of the risks, benefits and alternatives to treatment. A timeout was  performed prior to the initiation of the procedure. Initial ultrasound scanning demonstrates a small amount of intra-abdominal ascites with dominant pocket located within right upper abdomen adjacent to the hepatic parenchyma. As such, the skin overlying the  right upper abdomen was subsequently prepped and draped in the usual sterile fashion. 1% lidocaine with epinephrine was used for local anesthesia. Under direct ultrasound guidance, an 8 Fr Safe-T-Centesis catheter was introduced. The paracentesis was performed. A small amount of aspirated fluid was capped and sent to the laboratory for cytologic analysis as well as triglyceride levels. The catheter was removed and a dressing was applied. Next, attention was paid towards ultrasound-guided biopsy of known hypermetabolic right inguinal lymphadenopathy. Sonographic evaluation of the right groin demonstrates an at least 2.4 x 0.8 cm right inguinal lymph node correlating with the dominant right inguinal lymph node seen on preceding abdominal CT image 90, series 2. The procedure was planned. The right groin was prepped and draped in usual sterile fashion. After the overlying soft tissues anesthetized 1% lidocaine with epinephrine, an 18 gauge core needle biopsy device was utilized to obtain 6 core needle biopsy samples. Multiple ultrasound images were saved procedural documentation purposes. Samples were placed in saline and submitted to the laboratory for pathologic analysis. The patient tolerated the above procedures well without immediate postprocedural complication. FINDINGS: A total of approximately 2 liters of chylous fluid was removed. Samples were sent to the laboratory as requested by the clinical team. Successful ultrasound-guided biopsy dominant right inguinal lymph node IMPRESSION: 1. Successful ultrasound-guided biopsy of dominant right inguinal node. 2. Successful ultrasound-guided paracentesis yielding 2 liters of chylous appearing fluid. Fluid was sent  to the laboratory for cytologic analysis as well as triglyceride levels. Electronically Signed   By: Sandi Mariscal M.D.   On: 06/26/2020 13:16    ASSESSMENT & PLAN:   64 year old gentleman with  #1 extensive cervical thoracic and primarily abdominal pelvic lymphadenopathy highly suspicious for high-grade lymphoma. #2 abdominal distention and discomfort along with newly noted chylous effusion showing lymphocytic predominance. Patient status post paracentesis with removal of 2 L of fluid with improvement in symptoms. #3 shortness of breath-echo shows normal ejection fraction.  CT chest negative for PE or pneumonia.  Negative enzymes.  Plan -Status post inguinal lymph node biopsy as well as paracentesis with removal of 2 L of chylous effusion on 06/26/2020 with improvement in his abdominal discomfort and shortness of breath.  However, he has developed worsening abdominal distention and some mild shortness of breath this morning.  Recommend ultrasound-guided paracentesis by IR today.  Order has been placed. -He has ongoing lower extremity edema, but now the right leg is more edematous than the left.  Could be related to worsening inguinal adenopathy, but will order Doppler ultrasound of the lower extremities to evaluate for DVT. -Discussed with pathology and preliminary results of biopsy consistent with lymphoma, CD10 positive.  Still awaiting final IHC.  Hopefully available later today. -We discussed the findings of his imaging studies. -He has been started on high-dose prednisone at 80 mg p.o. daily to help with starting to treat his suspected lymphoma.  Patient is agreeable to this plan pending final biopsy results. -GI prophylaxis with PPI. -Uric acid level was elevated and he was started on allopurinol due to concern for tumor lysis syndrome.  Uric acid level has normalized. -Lorazepam as needed for insomnia or anxiety or tremulousness related to high-dose steroids. -Patient was recommended to  the prednisone on a full stomach. -Appreciate the excellent hospital medicine and nursing care on [facility] for [facility] -Pending biopsy result, may need to consider initiation of chemotherapy as an inpatient if he is found to have high-grade lymphoma.  Mikey Bussing, DNP, AGPCNP-BC, AOCNP Mon/Tues/Thurs/Fri 7am-5pm; Off  Wednesdays Cell: [phone]   ADDENDUM  .Patient was Personally and independently interviewed, examined and relevant elements of the history of present illness were reviewed in details and an assessment and plan was created. All elements of the patient's history of present illness , assessment and plan were discussed in details with Mikey Bussing, DNP, AGPCNP-BC, AOCNP. The above documentation reflects our combined findings assessment and plan.  SURGICAL PATHOLOGY  CASE: [id number]  PATIENT: Josemanuel Venuto  Surgical Pathology Report      Clinical History: Concern for lymphoma, post US guided biopsy of right  inguinal lymph node ([facility])      FINAL MICROSCOPIC DIAGNOSIS:   A. LYMPH NODE, RIGHT INGUINAL, NEEDLE CORE BIOPSY:  - Atypical lymphoid proliferation  - See comment    COMMENT:   The sections show a few small needle biopsy fragments displaying  abundance of fibro-adipose and vascular tissues associated with a  relatively limited lymphoid component. The latter shows scattering of  variably sized atypical appearing follicles characterized by lack of  mantle zones or polarity and a homogeneous composition of medium to  large lymphoid appearing cells. The lymphoid tissue between the  follicles show a mixture of small round to angulated lymphoid cells and  variable number of large lymphoid cells but to generally lesser extent.  Previous flow cytometric analysis ([id number]) was attempted but there  were insufficient cells present for analysis. Immunohistochemical  stains for CD5 CD3, CD23, CD20, CD10, BCL6, cyclin D1, BCL-2, and Ki-67  were  performed with appropriate controls.  There is predominance of B cells in both the follicular and  interfollicular areas as seen with CD20 associated with CD10, BCL6, and  BCL 2 expression. CD23 highlights the follicular dendritic networks  within the previously mentioned follicles. No significant staining is  seen with cyclin D1. Ki-67 shows variable expression within the  follicles ranging from 95% 26fcally) to over 50% in many. The  interfollicular zones show generally lower Ki67 expression (10% or  less). Despite limited lymphoid tissue, the morphologic and  immunophenotypic features are atypical and favor involvement by  non-Hodgkin's B-cell lymphoma particularly follicular lymphoma. In this  limited material, a high-grade process is slightly favored. Excisional  biopsy may be warranted for confirmation and accurate grading.    Assessment  1) newly diagnosed stage III-IV high-grade follicular lymphoma.  Plan -I discussed the pathology results in detail with Dr. SGari Crown  Though the material in the biopsy is somewhat limited it is strongly favored to represent a high-grade process.  Also with the patient's elevated LDH spontaneous tumor lysis and clinical course-high-grade process is the most likely explanation. -I discussed options of additional biopsies with the patient versus proceeding with treatment as high-grade follicular lymphoma.  He prefers to pursue treatment soon and hold off on additional biopsies which might delay treatment since he is having significant symptomatology from his abdominal distention. -Repeat ultrasound of the abdomen today did show improvement in his ascites with steroids since his last paracentesis and no additional paracentesis was indicated. -I counseled the patient on the NCCN guidelines for treatment and recommended pursuing R-CHOP chemotherapy with G-CSF support. -His echocardiogram showed normal ejection fraction. -We discussed potential adverse  effects from the chemotherapy and advantages. -I discussed the diagnosis, natural history and prognosis of his condition.  Depending on the presence of a low-grade follicular lymphoma component the risk off recurrence might vary. -Given his significant symptomatic disease would want to pursue first cycle of R-CHOP chemotherapy as inpatient with plan to pursue  remaining treatment as outpatient. -Timing of treatment based on logistics of nursing etc..   Sullivan Lone MD MS

## 2020-06-30 ENCOUNTER — Inpatient Hospital Stay (HOSPITAL_COMMUNITY): Payer: 59

## 2020-06-30 DIAGNOSIS — R1084 Generalized abdominal pain: Secondary | ICD-10-CM | POA: Diagnosis not present

## 2020-06-30 DIAGNOSIS — C8248 Follicular lymphoma grade IIIb, lymph nodes of multiple sites: Secondary | ICD-10-CM

## 2020-06-30 DIAGNOSIS — Z5111 Encounter for antineoplastic chemotherapy: Secondary | ICD-10-CM | POA: Diagnosis not present

## 2020-06-30 DIAGNOSIS — R188 Other ascites: Secondary | ICD-10-CM

## 2020-06-30 DIAGNOSIS — R591 Generalized enlarged lymph nodes: Secondary | ICD-10-CM | POA: Diagnosis not present

## 2020-06-30 DIAGNOSIS — J90 Pleural effusion, not elsewhere classified: Secondary | ICD-10-CM | POA: Diagnosis not present

## 2020-06-30 HISTORY — PX: IR IMAGING GUIDED PORT INSERTION: IMG5740

## 2020-06-30 MED ORDER — ONDANSETRON HCL 4 MG/2ML IJ SOLN: 4.0000 mg | Freq: Four times a day (QID) | INTRAMUSCULAR | Status: DC | PRN

## 2020-06-30 MED ORDER — MIDAZOLAM HCL 2 MG/2ML IJ SOLN
INTRAMUSCULAR | Status: AC | PRN
  Administered 2020-06-30: 1 mg via INTRAVENOUS
  Administered 2020-06-30: 0.5 mg via INTRAVENOUS
  Administered 2020-06-30: 1 mg via INTRAVENOUS
  Administered 2020-06-30: 0.5 mg via INTRAVENOUS
  Administered 2020-06-30: 1 mg via INTRAVENOUS

## 2020-06-30 MED ORDER — DOXORUBICIN HCL CHEMO IV INJECTION 2 MG/ML
50.0000 mg/m2 | Freq: Once | INTRAVENOUS | Status: AC
  Administered 2020-07-01: 106 mg via INTRAVENOUS
  Filled 2020-06-30: qty 53

## 2020-06-30 MED ORDER — MIDAZOLAM HCL 2 MG/2ML IJ SOLN
INTRAMUSCULAR | Status: AC
  Filled 2020-06-30: qty 4

## 2020-06-30 MED ORDER — LIDOCAINE HCL (PF) 1 % IJ SOLN
INTRAMUSCULAR | Status: AC | PRN
  Administered 2020-06-30 (×2): 10 mL via INTRADERMAL

## 2020-06-30 MED ORDER — COLD PACK MISC ONCOLOGY
1.0000 | Freq: Once | Status: AC | PRN
  Filled 2020-06-30: qty 1

## 2020-06-30 MED ORDER — SODIUM CHLORIDE 0.9 % IV SOLN: Freq: Once | INTRAVENOUS | Status: AC

## 2020-06-30 MED ORDER — CEFAZOLIN SODIUM-DEXTROSE 2-4 GM/100ML-% IV SOLN
INTRAVENOUS | Status: AC
  Administered 2020-06-30: 2 g via INTRAVENOUS
  Filled 2020-06-30: qty 100

## 2020-06-30 MED ORDER — PROCHLORPERAZINE EDISYLATE 10 MG/2ML IJ SOLN: 10.0000 mg | Freq: Four times a day (QID) | INTRAMUSCULAR | Status: DC | PRN

## 2020-06-30 MED ORDER — VINCRISTINE SULFATE CHEMO INJECTION 1 MG/ML
2.0000 mg | Freq: Once | INTRAVENOUS | Status: AC
  Administered 2020-07-01: 2 mg via INTRAVENOUS
  Filled 2020-06-30: qty 2

## 2020-06-30 MED ORDER — HEPARIN SOD (PORK) LOCK FLUSH 100 UNIT/ML IV SOLN
INTRAVENOUS | Status: AC
  Filled 2020-06-30: qty 5

## 2020-06-30 MED ORDER — CHLORHEXIDINE GLUCONATE CLOTH 2 % EX PADS
6.0000 | MEDICATED_PAD | Freq: Every day | CUTANEOUS | Status: DC
  Administered 2020-06-30 – 2020-07-02 (×3): 6 via TOPICAL

## 2020-06-30 MED ORDER — SODIUM CHLORIDE 0.9 % IV SOLN
750.0000 mg/m2 | Freq: Once | INTRAVENOUS | Status: AC
  Administered 2020-07-01: 1580 mg via INTRAVENOUS
  Filled 2020-06-30: qty 79

## 2020-06-30 MED ORDER — SODIUM CHLORIDE 0.9 % IV SOLN
150.0000 mg | Freq: Once | INTRAVENOUS | Status: AC
  Administered 2020-07-01: 150 mg via INTRAVENOUS
  Filled 2020-06-30: qty 5

## 2020-06-30 MED ORDER — CEFAZOLIN SODIUM-DEXTROSE 2-4 GM/100ML-% IV SOLN: 2.0000 g | Freq: Once | INTRAVENOUS | Status: AC

## 2020-06-30 MED ORDER — PREDNISONE 20 MG PO TABS
60.0000 mg | ORAL_TABLET | Freq: Every day | ORAL | Status: DC
  Administered 2020-06-30 – 2020-07-02 (×3): 60 mg via ORAL
  Filled 2020-06-30 (×3): qty 3

## 2020-06-30 MED ORDER — HOT PACK MISC ONCOLOGY
1.0000 | Freq: Once | Status: AC | PRN
  Filled 2020-06-30: qty 1

## 2020-06-30 MED ORDER — LIDOCAINE HCL 1 % IJ SOLN
INTRAMUSCULAR | Status: AC
  Filled 2020-06-30: qty 20

## 2020-06-30 MED ORDER — FENTANYL CITRATE (PF) 100 MCG/2ML IJ SOLN
INTRAMUSCULAR | Status: AC
  Filled 2020-06-30: qty 2

## 2020-06-30 MED ORDER — FENTANYL CITRATE (PF) 100 MCG/2ML IJ SOLN
INTRAMUSCULAR | Status: AC | PRN
  Administered 2020-06-30 (×2): 50 ug via INTRAVENOUS

## 2020-06-30 MED ORDER — PALONOSETRON HCL INJECTION 0.25 MG/5ML
0.2500 mg | Freq: Once | INTRAVENOUS | Status: AC
  Administered 2020-07-01: 0.25 mg via INTRAVENOUS
  Filled 2020-06-30: qty 5

## 2020-06-30 MED ORDER — SODIUM CHLORIDE 0.9 % IV SOLN
10.0000 mg | Freq: Once | INTRAVENOUS | Status: AC
  Administered 2020-07-01: 10 mg via INTRAVENOUS
  Filled 2020-06-30: qty 1

## 2020-06-30 NOTE — Progress Notes (Signed)
[facility]  PROGRESS NOTE  George Proctor EHM:[id number] DOB: [birth year] DOA: 06/24/2020 PCP: [name], MD   Brief HPI:   64 year old male with past medical history of anxiety, depression, GERD came to ED with complaints of abdominal pain, dyspnea and orthopnea.  Patient was seen by Dr. [name],  oncology on 06/15/2020 for widespread lymphadenopathy.  Patient underwent PET/CT scan on 06/21/2020 with findings of hypermetabolic lesions throughout the body mostly in the abdominal suspicion for peritoneal carcinomatosis.   Patient came to ED with worsening abdominal pain, and swelling.  He was noted to be tachypneic with tachycardia.  Chest x-ray showed blunting of bilateral costophrenic angles.  CTA chest did not show PE.  Did have small bilateral pleural effusion with partial atelectasis of the lower lobes.   Patient underwent ultrasound-guided biopsy of right inguinal lymph node as well as ultrasound guided paracentesis yielding 2 L of chylous appearing fluid on 06/26/2020.  Biopsy results pending.  Subjective   Patient seen and examined, paracentesis could not be done yesterday as there was not enough fluid to be drained in the abdominal cavity.   Assessment/Plan:     1. High-grade follicular B-cell lymphoma--PET/CT scan showed extensive intra-abdominal lymphadenopathy, peritoneal carcinomatosis.  Patient underwent right inguinal lymph node biopsy as well as paracentesis yielding chylous fluid.  Peritoneal fluid analysis shows non-Hodgkin's B-cell lymphoma.  Right inguinal tissue biopsy specimen was insufficient for analysis.  Continue high-dose prednisone 80 mg daily.  Oncology is planning to start palliative chemotherapy with R-CHOP from tomorrow 07/01/2020.  Port-A-Cath will be placed prior to chemotherapy infusion.   2. Generalized lymphadenopathy- secondary to  lymphoma as above.  Hepatitis B and C were negative.  HIV was negative. 3. Peripheral edema-likely from anasarca , albumin is  3.2.  Also could be from widespread lymphadenopathy. 4. Dyspnea-improved, chest x-ray showed mild to moderate pleural effusion.  He underwent paracentesis which improved dyspnea.    Echocardiogram showed EF 60 to 70% with grade 1 diastolic dysfunction.  Patient did receive 1 dose of Lasix.  He is currently on 1 L/min  oxygen via nasal cannula. 5. ?  Tumor lysis syndrome-uric acid level elevated at 13.1.  Patient started on allopurinol.  Uric acid has come down to 10.3.     COVID-19 Labs  No results for input(s): DDIMER, FERRITIN, LDH, CRP in the last 72 hours.  Lab Results  Component Value Date   [place] NEGATIVE 06/24/2020     Scheduled medications:   . allopurinol  100 mg Oral BID  . [START ON 07/01/2020] cyclophosphamide  750 mg/m2 (Treatment Plan Recorded) Intravenous Once  . [START ON 07/01/2020] DOXOrubicin  50 mg/m2 (Treatment Plan Recorded) Intravenous Once  . enoxaparin (LOVENOX) injection  40 mg Subcutaneous Q24H  . heparin lock flush      . lidocaine      . [START ON 07/01/2020] palonosetron  0.25 mg Intravenous Once  . pantoprazole  40 mg Oral Daily  . predniSONE  80 mg Oral Q breakfast  . senna  1 tablet Oral BID  . [START ON 07/01/2020] vinCRIStine (ONCOVIN) CHEMO IV infusion  2 mg Intravenous Once         CBG: No results for input(s): GLUCAP in the last 168 hours.  SpO2: 97 % O2 Flow Rate (L/min): 4 L/min    CBC: Recent Labs  Lab 06/24/20 1345 06/25/20 0305 06/26/20 0326 06/27/20 0742 06/28/20 0311  WBC 5.4 4.5 5.1 4.9 5.3  NEUTROABS 4.4  --   --  4.3  --   HGB 14.8 14.0 14.1 13.4 12.3*  HCT 44.4 42.5 43.9 41.1 36.9*  MCV 85.5 86.6 87.6 85.4 84.8  PLT 303 278 297 311 601    Basic Metabolic Panel: Recent Labs  Lab 06/25/20 0305 06/26/20 0326 06/27/20 0742 06/28/20 0311 06/29/20 0320  NA 134* 134* 135 135 139  K 4.5 4.0 4.6 4.4 4.3  CL 99 96* 98 100 102  CO2 24 25 26 26 27   GLUCOSE 91 93 124* 116* 99  BUN 13 13 23 23 19    CREATININE 1.05 1.17 1.17 1.10 0.90  CALCIUM 8.7* 8.9 8.9 8.5* 8.5*  MG 2.0 1.9  --  2.3  --   PHOS 4.2  --   --   --   --      Liver Function Tests: Recent Labs  Lab 06/24/20 1345 06/25/20 0305 06/27/20 0742 06/29/20 0320  AST 97* 88* 70* 75*  ALT 30 29 25  32  ALKPHOS 126 113 105 86  BILITOT 0.9 0.8 0.4 0.4  PROT 6.3* 5.8* 5.7* 5.1*  ALBUMIN 3.5 3.3* 3.2* 3.1*     Antibiotics: Anti-infectives (From admission, onward)   Start     Dose/Rate Route Frequency Ordered Stop   06/30/20 1500  ceFAZolin (ANCEF) IVPB 2g/100 mL premix        2 g 200 mL/hr over 30 Minutes Intravenous  Once 06/30/20 1411 06/30/20 1445       DVT prophylaxis: Lovenox  Code Status: Full code  Family Communication: No family at bedside    Status is: Inpatient  Dispo: The patient is from: Home              Anticipated d/c is to: Home              Anticipated d/c date is: 07/03/2020              Patient currently not medically stable for discharge  Barrier to discharge-ongoing work-up for lymphoma      Consultants:  Oncology  Procedures:  Right inguinal lymph node biopsy, ultrasound-guided paracentesis   Objective   Vitals:   06/30/20 1510 06/30/20 1515 06/30/20 1520 06/30/20 1521  BP: 126/88 122/85 125/86 121/83  Pulse: 94 95 95 94  Resp: 11 15 12 12   Temp:      TempSrc:      SpO2: 97% 97% 97% 97%  Weight:      Height:        Intake/Output Summary (Last 24 hours) at 06/30/2020 1540 Last data filed at 06/30/2020 1446 Gross per 24 hour  Intake 960 ml  Output --  Net 960 ml    10/13 1901 - 10/15 0700 In: 2100 [P.O.:2100] Out: -   Filed Weights   06/24/20 1226  Weight: 85.7 kg    Physical Examination:    General-appears in no acute distress  Heart-S1-S2, regular, no murmur auscultated  Lungs-clear to auscultation bilaterally, no wheezing or crackles auscultated  Abdomen-soft, nontender, no organomegaly  Extremities-no edema in the lower  extremities  Neuro-alert, oriented x3, no focal deficit noted    Data Reviewed:   Recent Results (from the past 240 hour(s))  Blood culture (routine single)     Status: None   Collection Time: 06/24/20  1:45 PM   Specimen: BLOOD  Result Value Ref Range Status   Specimen Description   Final    BLOOD Performed at [facility], [place] [address] [place], [place] [address]    Special  Requests   Final    NONE Performed at [facility], [place] [address], [place], [place] [address]    Culture   Final    NO GROWTH 5 DAYS Performed at [facility], [place] [address], [place], [place] [address]    Report Status 06/29/2020 FINAL  Final  Respiratory Panel by RT PCR (Flu A&B, Covid) - Nasopharyngeal Swab     Status: None   Collection Time: 06/24/20  1:45 PM   Specimen: Nasopharyngeal Swab  Result Value Ref Range Status   SARS Coronavirus 2 by RT PCR NEGATIVE NEGATIVE Final    Comment: (NOTE) SARS-CoV-2 target nucleic acids are NOT DETECTED.  The SARS-CoV-2 RNA is generally detectable in upper respiratoy specimens during the acute phase of infection. The lowest concentration of SARS-CoV-2 viral copies this assay can detect is 131 copies/mL. A negative result does not preclude SARS-Cov-2 infection and should not be used as the sole basis for treatment or other patient management decisions. A negative result may occur with  improper specimen collection/handling, submission of specimen other than nasopharyngeal swab, presence of viral mutation(s) within the areas targeted by this assay, and inadequate number of viral copies (<131 copies/mL). A negative result must be combined with clinical observations, patient history, and epidemiological information. The expected result is Negative.  Fact Sheet for Patients:  PinkCheek.be  Fact Sheet for Healthcare Providers:  GravelBags.it  This test  is no t yet approved or cleared by the [place] FDA and  has been authorized for detection and/or diagnosis of SARS-CoV-2 by FDA under an Emergency Use Authorization (EUA). This EUA will remain  in effect (meaning this test can be used) for the duration of the COVID-19 declaration under Section 564(b)(1) of the Act, 21 U.S.C. section 360bbb-3(b)(1), unless the authorization is terminated or revoked sooner.     Influenza A by PCR NEGATIVE NEGATIVE Final   Influenza B by PCR NEGATIVE NEGATIVE Final    Comment: (NOTE) The Xpert Xpress SARS-CoV-2/FLU/RSV assay is intended as an aid in  the diagnosis of influenza from Nasopharyngeal swab specimens and  should not be used as a sole basis for treatment. Nasal washings and  aspirates are unacceptable for Xpert Xpress SARS-CoV-2/FLU/RSV  testing.  Fact Sheet for Patients: PinkCheek.be  Fact Sheet for Healthcare Providers: GravelBags.it  This test is not yet approved or cleared by the [place] FDA and  has been authorized for detection and/or diagnosis of SARS-CoV-2 by  FDA under an Emergency Use Authorization (EUA). This EUA will remain  in effect (meaning this test can be used) for the duration of the  Covid-19 declaration under Section 564(b)(1) of the Act, 21  U.S.C. section 360bbb-3(b)(1), unless the authorization is  terminated or revoked. Performed at [facility], [place] [address], [place], [place] [address]   Urine culture     Status: None   Collection Time: 06/24/20  5:28 PM   Specimen: In/Out Cath Urine  Result Value Ref Range Status   Specimen Description   Final    IN/OUT CATH URINE Performed at [place] [address] [place], [place] [address]    Special Requests   Final    NONE Performed at [facility], [place] [address], [place], [place] [address]    Culture   Final    NO GROWTH Performed  at [facility], [place] [address], [place],  [address]    Report Status 06/26/2020 FINAL  Final  Body fluid culture     Status: None   Collection Time: 06/26/20 11:40 AM   Specimen: PATH Cytology Peritoneal fluid  Result Value Ref Range Status   Specimen Description   Final    PERITONEAL Performed at [facility] [address] [place], [place] [address]    Special Requests   Final    NONE Performed at [facility], [place] [address], [place], [place] [address]    Gram Stain   Final    RARE WBC PRESENT, PREDOMINANTLY MONONUCLEAR NO ORGANISMS SEEN    Culture   Final    NO GROWTH 3 DAYS Performed at [facility], [place] [address], [place], [place] [address]    Report Status 06/29/2020 FINAL  Final    No results for input(s): LIPASE, AMYLASE in the last 168 hours. No results for input(s): AMMONIA in the last 168 hours.  Cardiac Enzymes: No results for input(s): CKTOTAL, CKMB, CKMBINDEX, TROPONINI in the last 168 hours. BNP (last 3 results) Recent Labs    06/25/20 0300  BNP 29.7        [place]   [facility] If 7PM-7AM, please contact night-coverage at [email or web address], Office  [phone]   06/30/2020, 3:40 PM  LOS: 6 days

## 2020-06-30 NOTE — Procedures (Signed)
Interventional Radiology Procedure Note  Procedure: Single Lumen Power Port Placement    Access:  Right IJ vein.  Findings: Catheter tip positioned at SVC/RA junction. Port is ready for immediate use.   Complications: None  EBL: < 10 mL  Recommendations:  - Ok to shower in 24 hours - Do not submerge for 7 days - Routine line care   Syrus Nakama T. Konnor Vondrasek, M.D Pager:  [phone]   

## 2020-06-30 NOTE — Progress Notes (Addendum)
CHOP chemotherapy to be given on 07/01/20. Will re-evaluate giving Rituxan on 07/03/20 as inpatient or as outpatient at [facility] next week. Pt scheduled for G-CSF outpatient on 07/03/20. Notified PA team of possible Udenyca and Ruxience orders.  Clarified Prednisone dose with NP. Patient will get Prednisone 60mg  today and then continue for 5 more days. (Patient has been on Prednisone 80mg  daily starting 06/26/20). Erasmo Downer will enter Prednisone order.  Hardie Pulley, PharmD, BCPS, BCOP

## 2020-06-30 NOTE — Progress Notes (Addendum)
George Proctor Kitchen   HEMATOLOGY/ONCOLOGY INPATIENT PROGRESS NOTE  Date of Service: 06/30/2020  Inpatient Attending: .Oswald Hillock, MD   SUBJECTIVE  [name] was admitted on 06/24/2020 with abdominal pain abdominal distention and shortness of breath and was noted to have some mild hypoxia.  Work-up ruled out a PE and acute coronary artery disease.  Echocardiogram has shown normal ejection fraction. He had a PET scan as outpatient on 06/21/2020 as a part of his initial work-up for suspected lymphoma which shows hypermetabolic adenopathy throughout the neck chest abdomen and pelvis with most prominent findings in the abdomen with extensive peritoneal carcinomatosis.  He also has developed some moderate bilateral pleural effusions.  The patient underwent ultrasound-guided biopsy of the right inguinal lymph node as well as an ultrasound-guided paracentesis yielding 2 L of chylous appearing fluid on 06/26/2020.  He had improvement of his breathing abdominal distention following the procedure.  He was started on high-dose prednisone at 80 mg daily following the biopsy.  He was also started on allopurinol due to an elevated uric acid level and concern for tumor lysis syndrome.  He was seen by interventional radiology yesterday and evaluated for possible paracentesis.  There was not enough fluid to withdraw and the procedure was held.  He continues to have lower extremity edema, right greater than left.  Bilateral Doppler ultrasound performed yesterday and was negative for DVT.  Awaiting Port-A-Cath placement by IR today.   OBJECTIVE:  NAD  PHYSICAL EXAMINATION: . Vitals:   06/29/20 0532 06/29/20 1352 06/29/20 2125 06/30/20 0532  BP: 125/90 136/86 132/90 (!) 121/93  Pulse: 96 98 93 85  Resp: 14 18 16 14   Temp: 98.5 F (36.9 C) 98.1 F (36.7 C) 99 F (37.2 C) 98.9 F (37.2 C)  TempSrc: Oral Oral Oral Oral  SpO2: 93% 94% 95% 95%  Weight:      Height:       Filed Weights   06/24/20 1226  Weight: 85.7 kg    .Body mass index is 24.94 kg/m.  GENERAL:alert, in no acute distress and comfortable SKIN: skin color, texture, turgor are normal, no rashes or significant lesions EYES: normal, conjunctiva are pink and non-injected, sclera clear OROPHARYNX:no exudate, no erythema and lips, buccal mucosa, and tongue normal  NECK: supple, no JVD, thyroid normal size, non-tender, without nodularity LYMPH: Palpable inguinal adenopathy LUNGS: clear to auscultation with normal respiratory effort HEART: regular rate & rhythm,  no murmurs and no lower extremity edema ABDOMEN: abdomen distended with inverted umbilicus.  Mild tenderness to palpation over central abdomen. Musculoskeletal: 2+ bilateral lower extremity edema, right greater than left PSYCH: alert & oriented x 3 with fluent speech NEURO: no focal motor/sensory deficits  MEDICAL HISTORY:  Past Medical History:  Diagnosis Date  . Anxiety   . Depression   . Gait difficulty   . Lymphoma ([place])   . Memory loss     SURGICAL HISTORY: Past Surgical History:  Procedure Laterality Date  . ABDOMINAL SURGERY     Childhood  . BUNIONECTOMY    . IR PARACENTESIS  06/26/2020  . IR US GUIDE BX ASP/DRAIN  06/26/2020    SOCIAL HISTORY: Social History   Socioeconomic History  . Marital status: Married    Spouse name: Not on file  . Number of children: 0  . Years of education: Veterinary surgeon  . Highest education level: Not on file  Occupational History  . Occupation: Scientist, research (physical sciences)  Tobacco Use  . Smoking status: Never Smoker  .  Smokeless tobacco: Never Used  Vaping Use  . Vaping Use: Never used  Substance and Sexual Activity  . Alcohol use: No    Alcohol/week: 0.0 standard drinks    Comment: Less than one drink per week.  . Drug use: No  . Sexual activity: Not Currently  Other Topics Concern  . Not on file  Social History Narrative   Lives at home with wife.   Right-handed.   3 cups caffeine per day.   Social  Determinants of Health   Financial Resource Strain:   . Difficulty of Paying Living Expenses: Not on file  Food Insecurity:   . Worried About Charity fundraiser in the Last Year: Not on file  . Ran Out of Food in the Last Year: Not on file  Transportation Needs:   . Lack of Transportation (Medical): Not on file  . Lack of Transportation (Non-Medical): Not on file  Physical Activity:   . Days of Exercise per Week: Not on file  . Minutes of Exercise per Session: Not on file  Stress:   . Feeling of Stress : Not on file  Social Connections:   . Frequency of Communication with Friends and Family: Not on file  . Frequency of Social Gatherings with Friends and Family: Not on file  . Attends Religious Services: Not on file  . Active Member of Clubs or Organizations: Not on file  . Attends Archivist Meetings: Not on file  . Marital Status: Not on file  Intimate Partner Violence:   . Fear of Current or Ex-Partner: Not on file  . Emotionally Abused: Not on file  . Physically Abused: Not on file  . Sexually Abused: Not on file    FAMILY HISTORY: Family History  Problem Relation Age of Onset  . Aneurysm Mother 63       Thoracic  . Hypertension Father   . Heart failure Father   . Heart disease Father   . Heart attack Paternal Grandfather   . Depression Cousin   . Bipolar disorder Cousin   . Schizophrenia Maternal Uncle     ALLERGIES:  is allergic to omeprazole.  MEDICATIONS:  Scheduled Meds: . allopurinol  100 mg Oral BID  . enoxaparin (LOVENOX) injection  40 mg Subcutaneous Q24H  . pantoprazole  40 mg Oral Daily  . predniSONE  80 mg Oral Q breakfast  . senna  1 tablet Oral BID   Continuous Infusions: PRN Meds:.albuterol, alum & mag hydroxide-simeth, bisacodyl, HYDROmorphone (DILAUDID) injection, LORazepam, ondansetron (ZOFRAN) IV, oxyCODONE-acetaminophen, polyethylene glycol  REVIEW OF SYSTEMS:    10 Point review of Systems was done is negative except as noted  above.   LABORATORY DATA:  I have reviewed the data as listed  . CBC Latest Ref Rng & Units 06/28/2020 06/27/2020 06/26/2020  WBC 4.0 - 10.5 K/uL 5.3 4.9 5.1  Hemoglobin 13.0 - 17.0 g/dL 12.3(L) 13.4 14.1  Hematocrit 39 - 52 % 36.9(L) 41.1 43.9  Platelets 150 - 400 K/uL 297 311 297    . CMP Latest Ref Rng & Units 06/29/2020 06/28/2020 06/27/2020  Glucose 70 - 99 mg/dL 99 116(H) 124(H)  BUN 8 - 23 mg/dL 19 23 23   Creatinine 0.61 - 1.24 mg/dL 0.90 1.10 1.17  Sodium 135 - 145 mmol/L 139 135 135  Potassium 3.5 - 5.1 mmol/L 4.3 4.4 4.6  Chloride 98 - 111 mmol/L 102 100 98  CO2 22 - 32 mmol/L 27 26 26   Calcium 8.9 - 10.3 mg/dL  8.5(L) 8.5(L) 8.9  Total Protein 6.5 - 8.1 g/dL 5.1(L) - 5.7(L)  Total Bilirubin 0.3 - 1.2 mg/dL 0.4 - 0.4  Alkaline Phos 38 - 126 U/L 86 - 105  AST 15 - 41 U/L 75(H) - 70(H)  ALT 0 - 44 U/L 32 - 25     RADIOGRAPHIC STUDIES: I have personally reviewed the radiological images as listed and agreed with the findings in the report. CT Angio Chest PE W and/or Wo Contrast  Result Date: 06/24/2020 CLINICAL DATA:  64 year old male with recent diagnosis of lymphoma presenting with shortness of breath. Concern for pulmonary embolism. EXAM: CT ANGIOGRAPHY CHEST CT ABDOMEN AND PELVIS WITH CONTRAST TECHNIQUE: Multidetector CT imaging of the chest was performed using the standard protocol during bolus administration of intravenous contrast. Multiplanar CT image reconstructions and MIPs were obtained to evaluate the vascular anatomy. Multidetector CT imaging of the abdomen and pelvis was performed using the standard protocol during bolus administration of intravenous contrast. CONTRAST:  111mL OMNIPAQUE IOHEXOL 350 MG/ML SOLN COMPARISON:  Chest radiograph dated 06/24/2020. FINDINGS: Evaluation of this exam is limited due to respiratory motion artifact. CTA CHEST FINDINGS Cardiovascular: There is no cardiomegaly or pericardial effusion. The thoracic aorta is unremarkable.  Evaluation of the pulmonary arteries is limited due to respiratory motion artifact and suboptimal opacification and timing of the contrast. No large or central pulmonary artery embolus identified. Mediastinum/Nodes: There is no hilar adenopathy. Enlarged lymph nodes noted anterior to the heart and in the anterior cardiophrenic space. The esophagus and the thyroid gland are grossly unremarkable. No mediastinal fluid collection. Lungs/Pleura: Small to moderate bilateral pleural effusions with associated partial compressive atelectasis the lower lobes. Pneumonia is not excluded clinical correlation is recommended. There is no pneumothorax. The central airways are patent. Musculoskeletal: No chest wall abnormality. No acute or significant osseous findings. Review of the MIP images confirms the above findings. CT ABDOMEN and PELVIS FINDINGS No intra-abdominal free air. There is moderate ascites. Hepatobiliary: The liver is unremarkable. No intrahepatic biliary ductal dilatation. The gallbladder is unremarkable. Pancreas: The pancreas is suboptimally visualized but grossly unremarkable. Spleen: Normal in size without focal abnormality. Adrenals/Urinary Tract: The adrenal glands unremarkable. There is mild left hydronephrosis. There is delayed excretion of contrast by left kidney. The right kidney is unremarkable. The urinary bladder is grossly unremarkable. Stomach/Bowel: There is moderate stool throughout the colon. There is no bowel obstruction. The appendix is not visualized with certainty. No inflammatory changes identified in the right lower quadrant. Vascular/Lymphatic: Mild aortoiliac atherosclerotic disease. The IVC is grossly unremarkable. No portal venous gas. Extensive retroperitoneal adenopathy encasing the abdominal aorta and iliac arteries. There is diffuse mesenteric and omental implants and caking. Large soft tissue mass in the mesentery encasing the SMA measures approximately 11 x 17 cm in axial  dimensions and 15 cm in craniocaudal length. Reproductive: The prostate is grossly unremarkable. Other: Diffuse subcutaneous edema. Small fat containing umbilical hernia. Musculoskeletal: No acute or significant osseous findings. Review of the MIP images confirms the above findings. IMPRESSION: 1. No CT evidence of central pulmonary artery embolus. 2. Small to moderate bilateral pleural effusions with associated partial compressive atelectasis of the lower lobes. Pneumonia is not excluded clinical correlation is recommended. 3. Extensive retroperitoneal, mesenteric, and omental adenopathy in keeping with history of lymphoma. Enlarged lymph nodes in the anterior cardiophrenic space. 4. Moderate ascites. 5. Mild left hydronephrosis. 6. Aortic Atherosclerosis (ICD10-I70.0). Electronically Signed   By: Anner Crete M.D.   On: 06/24/2020 16:02   CT ABDOMEN  PELVIS W CONTRAST  Result Date: 06/24/2020 CLINICAL DATA:  64 year old male with recent diagnosis of lymphoma presenting with shortness of breath. Concern for pulmonary embolism. EXAM: CT ANGIOGRAPHY CHEST CT ABDOMEN AND PELVIS WITH CONTRAST TECHNIQUE: Multidetector CT imaging of the chest was performed using the standard protocol during bolus administration of intravenous contrast. Multiplanar CT image reconstructions and MIPs were obtained to evaluate the vascular anatomy. Multidetector CT imaging of the abdomen and pelvis was performed using the standard protocol during bolus administration of intravenous contrast. CONTRAST:  167mL OMNIPAQUE IOHEXOL 350 MG/ML SOLN COMPARISON:  Chest radiograph dated 06/24/2020. FINDINGS: Evaluation of this exam is limited due to respiratory motion artifact. CTA CHEST FINDINGS Cardiovascular: There is no cardiomegaly or pericardial effusion. The thoracic aorta is unremarkable. Evaluation of the pulmonary arteries is limited due to respiratory motion artifact and suboptimal opacification and timing of the contrast. No large or  central pulmonary artery embolus identified. Mediastinum/Nodes: There is no hilar adenopathy. Enlarged lymph nodes noted anterior to the heart and in the anterior cardiophrenic space. The esophagus and the thyroid gland are grossly unremarkable. No mediastinal fluid collection. Lungs/Pleura: Small to moderate bilateral pleural effusions with associated partial compressive atelectasis the lower lobes. Pneumonia is not excluded clinical correlation is recommended. There is no pneumothorax. The central airways are patent. Musculoskeletal: No chest wall abnormality. No acute or significant osseous findings. Review of the MIP images confirms the above findings. CT ABDOMEN and PELVIS FINDINGS No intra-abdominal free air. There is moderate ascites. Hepatobiliary: The liver is unremarkable. No intrahepatic biliary ductal dilatation. The gallbladder is unremarkable. Pancreas: The pancreas is suboptimally visualized but grossly unremarkable. Spleen: Normal in size without focal abnormality. Adrenals/Urinary Tract: The adrenal glands unremarkable. There is mild left hydronephrosis. There is delayed excretion of contrast by left kidney. The right kidney is unremarkable. The urinary bladder is grossly unremarkable. Stomach/Bowel: There is moderate stool throughout the colon. There is no bowel obstruction. The appendix is not visualized with certainty. No inflammatory changes identified in the right lower quadrant. Vascular/Lymphatic: Mild aortoiliac atherosclerotic disease. The IVC is grossly unremarkable. No portal venous gas. Extensive retroperitoneal adenopathy encasing the abdominal aorta and iliac arteries. There is diffuse mesenteric and omental implants and caking. Large soft tissue mass in the mesentery encasing the SMA measures approximately 11 x 17 cm in axial dimensions and 15 cm in craniocaudal length. Reproductive: The prostate is grossly unremarkable. Other: Diffuse subcutaneous edema. Small fat containing  umbilical hernia. Musculoskeletal: No acute or significant osseous findings. Review of the MIP images confirms the above findings. IMPRESSION: 1. No CT evidence of central pulmonary artery embolus. 2. Small to moderate bilateral pleural effusions with associated partial compressive atelectasis of the lower lobes. Pneumonia is not excluded clinical correlation is recommended. 3. Extensive retroperitoneal, mesenteric, and omental adenopathy in keeping with history of lymphoma. Enlarged lymph nodes in the anterior cardiophrenic space. 4. Moderate ascites. 5. Mild left hydronephrosis. 6. Aortic Atherosclerosis (ICD10-I70.0). Electronically Signed   By: Anner Crete M.D.   On: 06/24/2020 16:02   NM PET Image Initial (PI) Skull Base To Thigh  Result Date: 06/21/2020 CLINICAL DATA:  Initial treatment strategy for non-Hodgkin lymphoma. EXAM: NUCLEAR MEDICINE PET SKULL BASE TO THIGH TECHNIQUE: 9.5 mCi F-18 FDG was injected intravenously. Full-ring PET imaging was performed from the skull base to thigh after the radiotracer. CT data was obtained and used for attenuation correction and anatomic localization. Fasting blood glucose: 90 mg/dl COMPARISON:  CT abdomen pelvis 06/05/2020, coronary CT 01/28/2020 and CT chest  11/17/2017. FINDINGS: Mediastinal blood pool activity: SUV max 0.8 Liver activity: SUV max 3.2 NECK: Mild hypermetabolic lymph nodes in the neck are seen with an index left level 2 lymph node measuring approximately 6 mm (4/27) and fall SUV max of 4.0. Incidental CT findings: None. CHEST: Hypermetabolic low internal jugular mediastinal, internal mammary and axillary lymph nodes. Index low left paratracheal lymph measures 8 mm (4/64) with an SUV max of 6.8. Clustered prepericardiac lymph nodes measure up to 12 mm (4/90) with an SUV max of 10.0. No hypermetabolic pulmonary nodules. Incidental CT findings: Coronary artery calcification. Heart is mildly enlarged. No pericardial effusion. Moderate bilateral  pleural effusions with compressive atelectasis in both lower lobes. ABDOMEN/PELVIS: Hypermetabolic adenopathy is seen throughout the abdominal peritoneal ligaments, retroperitoneum and mesenteries. Conglomerate nodal mesenteric mass is difficult to differentiate from small bowel, which may be involved, with an SUV max of 14.4, measuring approximately 8.6 x 12.6 cm (4/137). Index gastrohepatic ligament lymph node measures approximately 2.3 cm (4/110) with an SUV max of 11.4. Extensive hypermetabolic peritoneal/omental thickening/caking, with an index 1.9 x 3.6 cm left omental nodule (4/121), SUV max 11.8. Inguinal lymph nodes measure up to 1.5 cm on the left (4/202) with an SUV max of 1.7. No abnormal hypermetabolism in the liver, adrenal glands, spleen or pancreas. Incidental CT findings: Visualized portion of the liver is grossly unremarkable. There may be sludge in the gallbladder. Adrenal glands and right kidney are grossly unremarkable. There may be a punctate stone in the lower pole left kidney. Left pelvicaliceal fullness versus mild hydronephrosis. Spleen is grossly unremarkable. Bladder appears thick-walled but is under distended. Ascites. SKELETON: No abnormal osseous hypermetabolism. Incidental CT findings: Degenerative changes in the spine. IMPRESSION: 1. Hypermetabolic adenopathy throughout the neck, chest, abdomen and pelvis. Findings are worst in the abdomen, with extensive peritoneal carcinomatosis. Small bowel involvement is not excluded. Findings are consistent with the provided history of lymphoma, [place] 5. 2. Moderate bilateral pleural effusions with compressive atelectasis in both lower lobes. 3. Left pelvicaliceal fullness versus mild hydronephrosis. 4. Question punctate left renal stone. Electronically Signed   By: Lorin Picket M.D.   On: 06/21/2020 09:04   US Abdomen Limited  Result Date: 06/29/2020 CLINICAL DATA:  Question ascites. EXAM: LIMITED ABDOMEN ULTRASOUND FOR ASCITES  TECHNIQUE: Limited ultrasound survey for ascites was performed in all four abdominal quadrants. COMPARISON:  CT 06/24/2020 FINDINGS: Four-quadrant scanning shows a small amount of ascites without any accessible collection. This appears considerably diminished in comparison to the CT of 5 days ago. IMPRESSION: Small amount of ascites. Amount appears considerably reduced compared with a CT scan of 5 days ago. Electronically Signed   By: Nelson Chimes M.D.   On: 06/29/2020 15:24   IR US Guide Bx Asp/Drain  Result Date: 06/26/2020 INDICATION: Concern for lymphoma. Please perform ultrasound-guided biopsy of hypermetabolic inguinal lymph node for tissue diagnostic purposes Additionally, patient currently admitted with abdominal pain and distension and as such request made for ascites search ultrasound ultrasound-guided paracentesis for diagnostic and therapeutic purposes as indicated. EXAM: 1. ULTRASOUND-GUIDED RIGHT INGUINAL LYMPH NODE BIOPSY 2. ULTRASOUND-GUIDED PARACENTESIS COMPARISON:  CT of the chest, abdomen and pelvis- 06/24/2020; PET-CT-06/21/2020 MEDICATIONS: None. COMPLICATIONS: None immediate. TECHNIQUE: Informed written consent was obtained from the patient after a discussion of the risks, benefits and alternatives to treatment. A timeout was performed prior to the initiation of the procedure. Initial ultrasound scanning demonstrates a small amount of intra-abdominal ascites with dominant pocket located within right upper abdomen adjacent to the hepatic  parenchyma. As such, the skin overlying the right upper abdomen was subsequently prepped and draped in the usual sterile fashion. 1% lidocaine with epinephrine was used for local anesthesia. Under direct ultrasound guidance, an 8 Fr Safe-T-Centesis catheter was introduced. The paracentesis was performed. A small amount of aspirated fluid was capped and sent to the laboratory for cytologic analysis as well as triglyceride levels. The catheter was removed and  a dressing was applied. Next, attention was paid towards ultrasound-guided biopsy of known hypermetabolic right inguinal lymphadenopathy. Sonographic evaluation of the right groin demonstrates an at least 2.4 x 0.8 cm right inguinal lymph node correlating with the dominant right inguinal lymph node seen on preceding abdominal CT image 90, series 2. The procedure was planned. The right groin was prepped and draped in usual sterile fashion. After the overlying soft tissues anesthetized 1% lidocaine with epinephrine, an 18 gauge core needle biopsy device was utilized to obtain 6 core needle biopsy samples. Multiple ultrasound images were saved procedural documentation purposes. Samples were placed in saline and submitted to the laboratory for pathologic analysis. The patient tolerated the above procedures well without immediate postprocedural complication. FINDINGS: A total of approximately 2 liters of chylous fluid was removed. Samples were sent to the laboratory as requested by the clinical team. Successful ultrasound-guided biopsy dominant right inguinal lymph node IMPRESSION: 1. Successful ultrasound-guided biopsy of dominant right inguinal node. 2. Successful ultrasound-guided paracentesis yielding 2 liters of chylous appearing fluid. Fluid was sent to the laboratory for cytologic analysis as well as triglyceride levels. Electronically Signed   By: Sandi Mariscal M.D.   On: 06/26/2020 13:16   DG Chest Port 1 View  Result Date: 06/24/2020 CLINICAL DATA:  Abdominal pain and swelling.  History of lymphoma. EXAM: PORTABLE CHEST 1 VIEW COMPARISON:  Chest radiograph 11/17/2017 FINDINGS: Normal cardiac and mediastinal contours. Small bilateral pleural effusions with underlying consolidation. No pleural effusion or pneumothorax. Osseous structures unremarkable. IMPRESSION: Small bilateral pleural effusions with underlying consolidation. Electronically Signed   By: Lovey Newcomer M.D.   On: 06/24/2020 14:11    ECHOCARDIOGRAM COMPLETE  Result Date: 06/25/2020    ECHOCARDIOGRAM REPORT   Patient Name:   George Proctor Date of Exam: 06/25/2020 Medical Rec #:  [id number]      Height:       73.0 in Accession #:    [id number]     Weight:       189.0 lb Date of Birth:  [birth year]      BSA:          2.101 m Patient Age:    64 years       BP:           136/92 mmHg Patient Gender: M              HR:           120 bpm. Exam Location:  Inpatient Procedure: 2D Echo Indications:    786.09 dyspnea  History:        Patient has prior history of Echocardiogram examinations, most                 recent 08/11/2019. Lymphoma.  Sonographer:    Jannett Celestine RDCS (AE) Referring Phys: [phone] [facility] [place]  Sonographer Comments: Suboptimal parasternal window and suboptimal subcostal window. IMPRESSIONS  1. Left ventricular ejection fraction, by estimation, is 65 to 70%. The left ventricle has normal function. The left ventricle has no regional wall motion abnormalities. Left ventricular diastolic parameters are  consistent with Grade I diastolic dysfunction (impaired relaxation).  2. Right ventricular systolic function is hyperdynamic. The right ventricular size is normal.  3. The mitral valve is normal in structure. No evidence of mitral valve regurgitation. No evidence of mitral stenosis.  4. The aortic valve is normal in structure. Aortic valve regurgitation is not visualized. No aortic stenosis is present. FINDINGS  Left Ventricle: Left ventricular ejection fraction, by estimation, is 65 to 70%. The left ventricle has normal function. The left ventricle has no regional wall motion abnormalities. The left ventricular internal cavity size was normal in size. There is  no left ventricular hypertrophy. Left ventricular diastolic parameters are consistent with Grade I diastolic dysfunction (impaired relaxation). Right Ventricle: The right ventricular size is normal. No increase in right ventricular wall thickness. Right ventricular systolic  function is hyperdynamic. Left Atrium: Left atrial size was normal in size. Right Atrium: Right atrial size was normal in size. Pericardium: There is no evidence of pericardial effusion. Mitral Valve: The mitral valve is normal in structure. No evidence of mitral valve regurgitation. No evidence of mitral valve stenosis. Tricuspid Valve: The tricuspid valve is normal in structure. Tricuspid valve regurgitation is trivial. No evidence of tricuspid stenosis. Aortic Valve: The aortic valve is normal in structure. Aortic valve regurgitation is not visualized. No aortic stenosis is present. Pulmonic Valve: The pulmonic valve was normal in structure. Pulmonic valve regurgitation is not visualized. No evidence of pulmonic stenosis. Aorta: The aortic root is normal in size and structure. Venous: The inferior vena cava was not well visualized. IAS/Shunts: No atrial level shunt detected by color flow Doppler.  LEFT VENTRICLE PLAX 2D LVOT diam:     2.50 cm LV SV:         72 LV SV Index:   34 LVOT Area:     4.91 cm  RIGHT VENTRICLE RV S prime:     18.00 cm/s TAPSE (M-mode): 1.8 cm LEFT ATRIUM             Index       RIGHT ATRIUM           Index LA Vol (A2C):   60.4 ml 28.75 ml/m RA Area:     15.80 cm LA Vol (A4C):   42.5 ml 20.23 ml/m RA Volume:   32.40 ml  15.42 ml/m LA Biplane Vol: 54.0 ml 25.71 ml/m  AORTIC VALVE LVOT Vmax:   95.30 cm/s LVOT Vmean:  74.500 cm/s LVOT VTI:    0.146 m MITRAL VALVE               TRICUSPID VALVE MV Area (PHT): 3.31 cm    TR Peak grad:   13.7 mmHg MV Decel Time: 229 msec    TR Vmax:        185.00 cm/s MV E velocity: 69.40 cm/s MV A velocity: 87.00 cm/s  SHUNTS MV E/A ratio:  0.80        Systemic VTI:  0.15 m                            Systemic Diam: 2.50 cm Skeet Latch MD Electronically signed by Skeet Latch MD Signature Date/Time: 06/25/2020/4:06:39 PM    Final    VAS [place] LOWER EXTREMITY VENOUS (DVT)  Result Date: 06/29/2020  Lower Venous DVTStudy Indications: Edema.  Risk  Factors: Underlying diagnosis of Non-Hodgkin's lymphoma. Limitations: Poor ultrasound/tissue interface. Performing Technologist: Maudry Mayhew MHA, RDMS, RVT, RDCS  Examination  Guidelines: A complete evaluation includes B-mode imaging, spectral Doppler, color Doppler, and power Doppler as needed of all accessible portions of each vessel. Bilateral testing is considered an integral part of a complete examination. Limited examinations for reoccurring indications may be performed as noted. The reflux portion of the exam is performed with the patient in reverse Trendelenburg.  +---------+---------------+---------+-----------+----------+--------------+ RIGHT    CompressibilityPhasicitySpontaneityPropertiesThrombus Aging +---------+---------------+---------+-----------+----------+--------------+ CFV      Full           Yes      Yes                                 +---------+---------------+---------+-----------+----------+--------------+ SFJ      Full                                                        +---------+---------------+---------+-----------+----------+--------------+ FV Prox  Full                                                        +---------+---------------+---------+-----------+----------+--------------+ FV Mid   Full                                                        +---------+---------------+---------+-----------+----------+--------------+ FV DistalFull                                                        +---------+---------------+---------+-----------+----------+--------------+ PFV      Full                                                        +---------+---------------+---------+-----------+----------+--------------+ POP      Full           Yes      Yes                                 +---------+---------------+---------+-----------+----------+--------------+ PTV      Full                                                         +---------+---------------+---------+-----------+----------+--------------+ PERO     Full                                                        +---------+---------------+---------+-----------+----------+--------------+   +----+---------------+---------+-----------+----------+--------------+  LEFTCompressibilityPhasicitySpontaneityPropertiesThrombus Aging +----+---------------+---------+-----------+----------+--------------+ CFV Full           Yes      Yes                                 +----+---------------+---------+-----------+----------+--------------+     Summary: RIGHT: - There is no evidence of deep vein thrombosis in the lower extremity.  - No cystic structure found in the popliteal fossa. - Ultrasound characteristics of enlarged lymph nodes are noted in the groin.  LEFT: - No evidence of common femoral vein obstruction. - Ultrasound characteristics of enlarged lymph nodes noted in the groin.  *See table(s) above for measurements and observations. Electronically signed by Servando Snare MD on 06/29/2020 at 5:37:15 PM.    Final    IR Paracentesis  Result Date: 06/26/2020 INDICATION: Concern for lymphoma. Please perform ultrasound-guided biopsy of hypermetabolic inguinal lymph node for tissue diagnostic purposes Additionally, patient currently admitted with abdominal pain and distension and as such request made for ascites search ultrasound ultrasound-guided paracentesis for diagnostic and therapeutic purposes as indicated. EXAM: 1. ULTRASOUND-GUIDED RIGHT INGUINAL LYMPH NODE BIOPSY 2. ULTRASOUND-GUIDED PARACENTESIS COMPARISON:  CT of the chest, abdomen and pelvis- 06/24/2020; PET-CT-06/21/2020 MEDICATIONS: None. COMPLICATIONS: None immediate. TECHNIQUE: Informed written consent was obtained from the patient after a discussion of the risks, benefits and alternatives to treatment. A timeout was performed prior to the initiation of the procedure. Initial ultrasound scanning  demonstrates a small amount of intra-abdominal ascites with dominant pocket located within right upper abdomen adjacent to the hepatic parenchyma. As such, the skin overlying the right upper abdomen was subsequently prepped and draped in the usual sterile fashion. 1% lidocaine with epinephrine was used for local anesthesia. Under direct ultrasound guidance, an 8 Fr Safe-T-Centesis catheter was introduced. The paracentesis was performed. A small amount of aspirated fluid was capped and sent to the laboratory for cytologic analysis as well as triglyceride levels. The catheter was removed and a dressing was applied. Next, attention was paid towards ultrasound-guided biopsy of known hypermetabolic right inguinal lymphadenopathy. Sonographic evaluation of the right groin demonstrates an at least 2.4 x 0.8 cm right inguinal lymph node correlating with the dominant right inguinal lymph node seen on preceding abdominal CT image 90, series 2. The procedure was planned. The right groin was prepped and draped in usual sterile fashion. After the overlying soft tissues anesthetized 1% lidocaine with epinephrine, an 18 gauge core needle biopsy device was utilized to obtain 6 core needle biopsy samples. Multiple ultrasound images were saved procedural documentation purposes. Samples were placed in saline and submitted to the laboratory for pathologic analysis. The patient tolerated the above procedures well without immediate postprocedural complication. FINDINGS: A total of approximately 2 liters of chylous fluid was removed. Samples were sent to the laboratory as requested by the clinical team. Successful ultrasound-guided biopsy dominant right inguinal lymph node IMPRESSION: 1. Successful ultrasound-guided biopsy of dominant right inguinal node. 2. Successful ultrasound-guided paracentesis yielding 2 liters of chylous appearing fluid. Fluid was sent to the laboratory for cytologic analysis as well as triglyceride levels.  Electronically Signed   By: Sandi Mariscal M.D.   On: 06/26/2020 13:16    ASSESSMENT & PLAN:   64 year old gentleman with  #1 extensive cervical thoracic and primarily abdominal pelvic lymphadenopathy consistent with high-grade follicular lymphoma. #2 abdominal distention and discomfort along with newly noted chylous effusion showing lymphocytic predominance. Patient status post paracentesis with removal of 2 L  of fluid with improvement in symptoms. #3 shortness of breath-echo shows normal ejection fraction.  CT chest negative for PE or pneumonia.  Negative enzymes.  Plan -Status post inguinal lymph node biopsy as well as paracentesis with removal of 2 L of chylous effusion on 06/26/2020 with improvement in his abdominal discomfort and shortness of breath.  He continues to have abdominal distention and some mild shortness of breath.  He was reevaluated by interventional radiology yesterday for possible paracentesis, but not enough fluid to draw off and procedure was held. -He has ongoing lower extremity edema, but now the right leg is more edematous than the left.  Bilateral Doppler ultrasound of lower extremities was negative for DVT.  Edema likely due to worsening adenopathy.   -Pathology report consistent with high-grade follicular B-cell lymphoma.  The patient is symptomatic and is at high risk for TLS and has ascites so we will plan to give first cycle of chemotherapy as an inpatient.  Plan to proceed with R-CHOP once Port-A-Cath is placed and he is transferred to the oncology unit for chemotherapy administration.  Discussed with inpatient oncology unit and chemotherapy will be given 10/16.  Will plan to hold rituximab over the weekend.  If he remains in the hospital, will plan to give rituximab early next week.  However, if he is discharged will arrange for outpatient rituximab in our office next week.  I have also tentatively scheduled him for an outpatient Neulasta or Udenyca injection in our  office on 10/18 if he is discharged. -Adverse effects of chemotherapy have been discussed including but not limited to alopecia, myelosuppression, nausea, vomiting, cardiotoxicity with Adriamycin, renal and hepatic dysfunction, risk of infusion reaction with rituximab.  The patient is agreeable to proceed. -Uric acid level was elevated and he was started on allopurinol due to concern for tumor lysis syndrome.  Uric acid level has normalized. -Lorazepam as needed for insomnia or anxiety or tremulousness related to high-dose steroids. -Appreciate the excellent hospital medicine and nursing care on [address] [facility] for [place] has been placed to transfer the patient to [facility] for chemotherapy.  Mikey Bussing, DNP, AGPCNP-BC, AOCNP Mon/Tues/Thurs/Fri 7am-5pm; Off Wednesdays Cell: [phone]   ADDENDUM  .Patient was Personally and independently interviewed, examined and relevant elements of the history of present illness were reviewed in details and an assessment and plan was created. All elements of the patient's history of present illness , assessment and plan were discussed in details with Mikey Bussing, DNP, AGPCNP-BC, AOCNP. The above documentation reflects our combined findings assessment and plan.  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  With bulky abdominal disease and generalized lymphadenopathy.  Possible small bowel involvement. Plan -With patient consent we will proceed with cycle 1 CHOP chemotherapy tomorrow 07/01/2020 based on nursing availability. -We shall set him up for Rituxan and Udenyca as outpatient on Monday, 07/03/2020. -We will need daily CBC, CMP and LDH levels to monitor for tumor lysis. -Continue allopurinol 100 mg p.o. twice daily. -We will get prednisone 60 mg p.o. daily as part of his R-CHOP regimen.   Sullivan Lone MD MS

## 2020-07-01 DIAGNOSIS — R109 Unspecified abdominal pain: Secondary | ICD-10-CM | POA: Diagnosis not present

## 2020-07-01 DIAGNOSIS — J9601 Acute respiratory failure with hypoxia: Secondary | ICD-10-CM

## 2020-07-01 DIAGNOSIS — C8248 Follicular lymphoma grade IIIb, lymph nodes of multiple sites: Secondary | ICD-10-CM | POA: Diagnosis not present

## 2020-07-01 DIAGNOSIS — R1084 Generalized abdominal pain: Secondary | ICD-10-CM | POA: Diagnosis not present

## 2020-07-01 LAB — CBC WITH DIFFERENTIAL/PLATELET
Abs Immature Granulocytes: 0.03 10*3/uL (ref 0.00–0.07)
Basophils Absolute: 0 10*3/uL (ref 0.0–0.1)
Basophils Relative: 0 %
Eosinophils Absolute: 0 10*3/uL (ref 0.0–0.5)
Eosinophils Relative: 0 %
HCT: 44.6 % (ref 39.0–52.0)
Hemoglobin: 14.4 g/dL (ref 13.0–17.0)
Immature Granulocytes: 0 %
Lymphocytes Relative: 4 %
Lymphs Abs: 0.3 10*3/uL — ABNORMAL LOW (ref 0.7–4.0)
MCH: 27.8 pg (ref 26.0–34.0)
MCHC: 32.3 g/dL (ref 30.0–36.0)
MCV: 86.1 fL (ref 80.0–100.0)
Monocytes Absolute: 0.5 10*3/uL (ref 0.1–1.0)
Monocytes Relative: 8 %
Neutro Abs: 6 10*3/uL (ref 1.7–7.7)
Neutrophils Relative %: 88 %
Platelets: 361 10*3/uL (ref 150–400)
RBC: 5.18 MIL/uL (ref 4.22–5.81)
RDW: 13.6 % (ref 11.5–15.5)
WBC: 6.9 10*3/uL (ref 4.0–10.5)
nRBC: 0 % (ref 0.0–0.2)

## 2020-07-01 LAB — COMPREHENSIVE METABOLIC PANEL
ALT: 26 U/L (ref 0–44)
AST: 65 U/L — ABNORMAL HIGH (ref 15–41)
Albumin: 3.2 g/dL — ABNORMAL LOW (ref 3.5–5.0)
Alkaline Phosphatase: 93 U/L (ref 38–126)
Anion gap: 12 (ref 5–15)
BUN: 22 mg/dL (ref 8–23)
CO2: 25 mmol/L (ref 22–32)
Calcium: 9 mg/dL (ref 8.9–10.3)
Chloride: 101 mmol/L (ref 98–111)
Creatinine, Ser: 0.96 mg/dL (ref 0.61–1.24)
GFR, Estimated: >60 mL/min (ref >60)
Glucose, Bld: 96 mg/dL (ref 70–99)
Potassium: 4.2 mmol/L (ref 3.5–5.1)
Sodium: 138 mmol/L (ref 135–145)
Total Bilirubin: 0.7 mg/dL (ref 0.3–1.2)
Total Protein: 5.7 g/dL — ABNORMAL LOW (ref 6.5–8.1)

## 2020-07-01 LAB — LACTATE DEHYDROGENASE: LDH: 637 U/L — ABNORMAL HIGH (ref 98–192)

## 2020-07-01 LAB — URIC ACID: Uric Acid, Serum: 6.2 mg/dL (ref 3.7–8.6)

## 2020-07-01 MED ORDER — LIDOCAINE-PRILOCAINE 2.5-2.5 % EX CREA
TOPICAL_CREAM | Freq: Once | CUTANEOUS | Status: AC
  Administered 2020-07-01: 1 via TOPICAL
  Filled 2020-07-01: qty 5

## 2020-07-01 MED ORDER — POLYETHYLENE GLYCOL 3350 17 G PO PACK
17.0000 g | PACK | Freq: Every day | ORAL | Status: DC | PRN
  Administered 2020-07-01: 17 g via ORAL
  Filled 2020-07-01: qty 1

## 2020-07-01 NOTE — Progress Notes (Signed)
George Proctor   HEMATOLOGY/ONCOLOGY INPATIENT PROGRESS NOTE  Date of Service: 07/01/2020  Inpatient Attending: .Oswald Hillock, MD  SUBJECTIVE  HPI: George Proctor was admitted on 06/24/2020 with abdominal pain abdominal distention and shortness of breath and was noted to have some mild hypoxia.  Work-up ruled out a PE and acute coronary artery disease.  Echocardiogram has shown normal ejection fraction. He had a PET scan as outpatient on 06/21/2020 as a part of his initial work-up for suspected lymphoma which shows hypermetabolic adenopathy throughout the neck chest abdomen and pelvis with most prominent findings in the abdomen with extensive peritoneal carcinomatosis.  He also has developed some moderate bilateral pleural effusions. The patient underwent ultrasound-guided biopsy of the right inguinal lymph node as well as an ultrasound-guided paracentesis yielding 2 L of chylous appearing fluid on 06/26/2020.  He had improvement of his breathing abdominal distention following the procedure.  He was started on high-dose prednisone at 80 mg daily following the biopsy.  He was also started on allopurinol due to an elevated uric acid level and concern for tumor lysis syndrome. A port was placed and chemo was planned to start on 07/01/2020.   Interval History: --today started CHOP Day 1. Rituximab to be given outpatient -- denies fevers, chills, sweats, nausea or vomiting -- no BM today, appetite remains good -- no additional questions, concerns, or complaints today  OBJECTIVE:  NAD  PHYSICAL EXAMINATION: . Vitals:   06/30/20 2339 07/01/20 0350 07/01/20 0545 07/01/20 1359  BP: 111/74 108/68 132/87 (!) 142/92  Pulse: (!) 107 (!) 105 (!) 110 (!) 102  Resp: 16 16 16 18   Temp: 97.7 F (36.5 C) (!) 97.4 F (36.3 C) 98 F (36.7 C) 97.6 F (36.4 C)  TempSrc: Oral Oral Oral Oral  SpO2: 94% 96% 92% 93%  Weight:      Height:       Filed Weights   06/24/20 1226  Weight: 189 lb (85.7 kg)   .Body mass index  is 24.94 kg/m.  GENERAL:alert, in no acute distress and comfortable SKIN: skin color, texture, turgor are normal, no rashes or significant lesions EYES: normal, conjunctiva are pink and non-injected, sclera clear LUNGS: clear to auscultation with normal respiratory effort HEART: regular rate & rhythm,  no murmurs and no lower extremity edema ABDOMEN: abdomen distended with inverted umbilicus.  Mild tenderness to palpation over central abdomen. Musculoskeletal: 2+ bilateral lower extremity edema, right greater than left PSYCH: alert & oriented x 3 with fluent speech NEURO: no focal motor/sensory deficits  MEDICAL HISTORY:  Past Medical History:  Diagnosis Date  . Anxiety   . Depression   . Gait difficulty   . Lymphoma ([place])   . Memory loss     SURGICAL HISTORY: Past Surgical History:  Procedure Laterality Date  . ABDOMINAL SURGERY     Childhood  . BUNIONECTOMY    . IR IMAGING GUIDED PORT INSERTION  06/30/2020  . IR PARACENTESIS  06/26/2020  . IR US GUIDE BX ASP/DRAIN  06/26/2020    SOCIAL HISTORY: Social History   Socioeconomic History  . Marital status: Married    Spouse name: Not on file  . Number of children: 0  . Years of education: Veterinary surgeon  . Highest education level: Not on file  Occupational History  . Occupation: Scientist, research (physical sciences)  Tobacco Use  . Smoking status: Never Smoker  . Smokeless tobacco: Never Used  Vaping Use  . Vaping Use: Never used  Substance and Sexual Activity  .  Alcohol use: No    Alcohol/week: 0.0 standard drinks    Comment: Less than one drink per week.  . Drug use: No  . Sexual activity: Not Currently  Other Topics Concern  . Not on file  Social History Narrative   Lives at home with wife.   Right-handed.   3 cups caffeine per day.   Social Determinants of Health   Financial Resource Strain:   . Difficulty of Paying Living Expenses: Not on file  Food Insecurity:   . Worried About Charity fundraiser in  the Last Year: Not on file  . Ran Out of Food in the Last Year: Not on file  Transportation Needs:   . Lack of Transportation (Medical): Not on file  . Lack of Transportation (Non-Medical): Not on file  Physical Activity:   . Days of Exercise per Week: Not on file  . Minutes of Exercise per Session: Not on file  Stress:   . Feeling of Stress : Not on file  Social Connections:   . Frequency of Communication with Friends and Family: Not on file  . Frequency of Social Gatherings with Friends and Family: Not on file  . Attends Religious Services: Not on file  . Active Member of Clubs or Organizations: Not on file  . Attends Archivist Meetings: Not on file  . Marital Status: Not on file  Intimate Partner Violence:   . Fear of Current or Ex-Partner: Not on file  . Emotionally Abused: Not on file  . Physically Abused: Not on file  . Sexually Abused: Not on file    FAMILY HISTORY: Family History  Problem Relation Age of Onset  . Aneurysm Mother 51       Thoracic  . Hypertension Father   . Heart failure Father   . Heart disease Father   . Heart attack Paternal Grandfather   . Depression Cousin   . Bipolar disorder Cousin   . Schizophrenia Maternal Uncle     ALLERGIES:  is allergic to omeprazole.  MEDICATIONS:  Scheduled Meds: . allopurinol  100 mg Oral BID  . Chlorhexidine Gluconate Cloth  6 each Topical Daily  . enoxaparin (LOVENOX) injection  40 mg Subcutaneous Q24H  . pantoprazole  40 mg Oral Daily  . predniSONE  60 mg Oral Q breakfast  . senna  1 tablet Oral BID   Continuous Infusions: PRN Meds:.albuterol, alum & mag hydroxide-simeth, bisacodyl, Cold Pack, Hot Pack, HYDROmorphone (DILAUDID) injection, LORazepam, [START ON 07/03/2020] ondansetron (ZOFRAN) IV, oxyCODONE-acetaminophen, polyethylene glycol, prochlorperazine  REVIEW OF SYSTEMS:    10 Point review of Systems was done is negative except as noted above.   LABORATORY DATA:  I have reviewed the  data as listed  . CBC Latest Ref Rng & Units 07/01/2020 06/28/2020 06/27/2020  WBC 4.0 - 10.5 K/uL 6.9 5.3 4.9  Hemoglobin 13.0 - 17.0 g/dL 14.4 12.3(L) 13.4  Hematocrit 39 - 52 % 44.6 36.9(L) 41.1  Platelets 150 - 400 K/uL 361 297 311    . CMP Latest Ref Rng & Units 07/01/2020 06/29/2020 06/28/2020  Glucose 70 - 99 mg/dL 96 99 116(H)  BUN 8 - 23 mg/dL 22 19 23   Creatinine 0.61 - 1.24 mg/dL 0.96 0.90 1.10  Sodium 135 - 145 mmol/L 138 139 135  Potassium 3.5 - 5.1 mmol/L 4.2 4.3 4.4  Chloride 98 - 111 mmol/L 101 102 100  CO2 22 - 32 mmol/L 25 27 26   Calcium 8.9 - 10.3 mg/dL 9.0 8.5(L)  8.5(L)  Total Protein 6.5 - 8.1 g/dL 5.7(L) 5.1(L) -  Total Bilirubin 0.3 - 1.2 mg/dL 0.7 0.4 -  Alkaline Phos 38 - 126 U/L 93 86 -  AST 15 - 41 U/L 65(H) 75(H) -  ALT 0 - 44 U/L 26 32 -     RADIOGRAPHIC STUDIES:  CT Angio Chest PE W and/or Wo Contrast  Result Date: 06/24/2020 CLINICAL DATA:  64 year old male with recent diagnosis of lymphoma presenting with shortness of breath. Concern for pulmonary embolism. EXAM: CT ANGIOGRAPHY CHEST CT ABDOMEN AND PELVIS WITH CONTRAST TECHNIQUE: Multidetector CT imaging of the chest was performed using the standard protocol during bolus administration of intravenous contrast. Multiplanar CT image reconstructions and MIPs were obtained to evaluate the vascular anatomy. Multidetector CT imaging of the abdomen and pelvis was performed using the standard protocol during bolus administration of intravenous contrast. CONTRAST:  168mL OMNIPAQUE IOHEXOL 350 MG/ML SOLN COMPARISON:  Chest radiograph dated 06/24/2020. FINDINGS: Evaluation of this exam is limited due to respiratory motion artifact. CTA CHEST FINDINGS Cardiovascular: There is no cardiomegaly or pericardial effusion. The thoracic aorta is unremarkable. Evaluation of the pulmonary arteries is limited due to respiratory motion artifact and suboptimal opacification and timing of the contrast. No large or central  pulmonary artery embolus identified. Mediastinum/Nodes: There is no hilar adenopathy. Enlarged lymph nodes noted anterior to the heart and in the anterior cardiophrenic space. The esophagus and the thyroid gland are grossly unremarkable. No mediastinal fluid collection. Lungs/Pleura: Small to moderate bilateral pleural effusions with associated partial compressive atelectasis the lower lobes. Pneumonia is not excluded clinical correlation is recommended. There is no pneumothorax. The central airways are patent. Musculoskeletal: No chest wall abnormality. No acute or significant osseous findings. Review of the MIP images confirms the above findings. CT ABDOMEN and PELVIS FINDINGS No intra-abdominal free air. There is moderate ascites. Hepatobiliary: The liver is unremarkable. No intrahepatic biliary ductal dilatation. The gallbladder is unremarkable. Pancreas: The pancreas is suboptimally visualized but grossly unremarkable. Spleen: Normal in size without focal abnormality. Adrenals/Urinary Tract: The adrenal glands unremarkable. There is mild left hydronephrosis. There is delayed excretion of contrast by left kidney. The right kidney is unremarkable. The urinary bladder is grossly unremarkable. Stomach/Bowel: There is moderate stool throughout the colon. There is no bowel obstruction. The appendix is not visualized with certainty. No inflammatory changes identified in the right lower quadrant. Vascular/Lymphatic: Mild aortoiliac atherosclerotic disease. The IVC is grossly unremarkable. No portal venous gas. Extensive retroperitoneal adenopathy encasing the abdominal aorta and iliac arteries. There is diffuse mesenteric and omental implants and caking. Large soft tissue mass in the mesentery encasing the SMA measures approximately 11 x 17 cm in axial dimensions and 15 cm in craniocaudal length. Reproductive: The prostate is grossly unremarkable. Other: Diffuse subcutaneous edema. Small fat containing umbilical  hernia. Musculoskeletal: No acute or significant osseous findings. Review of the MIP images confirms the above findings. IMPRESSION: 1. No CT evidence of central pulmonary artery embolus. 2. Small to moderate bilateral pleural effusions with associated partial compressive atelectasis of the lower lobes. Pneumonia is not excluded clinical correlation is recommended. 3. Extensive retroperitoneal, mesenteric, and omental adenopathy in keeping with history of lymphoma. Enlarged lymph nodes in the anterior cardiophrenic space. 4. Moderate ascites. 5. Mild left hydronephrosis. 6. Aortic Atherosclerosis (ICD10-I70.0). Electronically Signed   By: Anner Crete M.D.   On: 06/24/2020 16:02   CT ABDOMEN PELVIS W CONTRAST  Result Date: 06/24/2020 CLINICAL DATA:  64 year old male with recent diagnosis of lymphoma presenting  with shortness of breath. Concern for pulmonary embolism. EXAM: CT ANGIOGRAPHY CHEST CT ABDOMEN AND PELVIS WITH CONTRAST TECHNIQUE: Multidetector CT imaging of the chest was performed using the standard protocol during bolus administration of intravenous contrast. Multiplanar CT image reconstructions and MIPs were obtained to evaluate the vascular anatomy. Multidetector CT imaging of the abdomen and pelvis was performed using the standard protocol during bolus administration of intravenous contrast. CONTRAST:  123mL OMNIPAQUE IOHEXOL 350 MG/ML SOLN COMPARISON:  Chest radiograph dated 06/24/2020. FINDINGS: Evaluation of this exam is limited due to respiratory motion artifact. CTA CHEST FINDINGS Cardiovascular: There is no cardiomegaly or pericardial effusion. The thoracic aorta is unremarkable. Evaluation of the pulmonary arteries is limited due to respiratory motion artifact and suboptimal opacification and timing of the contrast. No large or central pulmonary artery embolus identified. Mediastinum/Nodes: There is no hilar adenopathy. Enlarged lymph nodes noted anterior to the heart and in the anterior  cardiophrenic space. The esophagus and the thyroid gland are grossly unremarkable. No mediastinal fluid collection. Lungs/Pleura: Small to moderate bilateral pleural effusions with associated partial compressive atelectasis the lower lobes. Pneumonia is not excluded clinical correlation is recommended. There is no pneumothorax. The central airways are patent. Musculoskeletal: No chest wall abnormality. No acute or significant osseous findings. Review of the MIP images confirms the above findings. CT ABDOMEN and PELVIS FINDINGS No intra-abdominal free air. There is moderate ascites. Hepatobiliary: The liver is unremarkable. No intrahepatic biliary ductal dilatation. The gallbladder is unremarkable. Pancreas: The pancreas is suboptimally visualized but grossly unremarkable. Spleen: Normal in size without focal abnormality. Adrenals/Urinary Tract: The adrenal glands unremarkable. There is mild left hydronephrosis. There is delayed excretion of contrast by left kidney. The right kidney is unremarkable. The urinary bladder is grossly unremarkable. Stomach/Bowel: There is moderate stool throughout the colon. There is no bowel obstruction. The appendix is not visualized with certainty. No inflammatory changes identified in the right lower quadrant. Vascular/Lymphatic: Mild aortoiliac atherosclerotic disease. The IVC is grossly unremarkable. No portal venous gas. Extensive retroperitoneal adenopathy encasing the abdominal aorta and iliac arteries. There is diffuse mesenteric and omental implants and caking. Large soft tissue mass in the mesentery encasing the SMA measures approximately 11 x 17 cm in axial dimensions and 15 cm in craniocaudal length. Reproductive: The prostate is grossly unremarkable. Other: Diffuse subcutaneous edema. Small fat containing umbilical hernia. Musculoskeletal: No acute or significant osseous findings. Review of the MIP images confirms the above findings. IMPRESSION: 1. No CT evidence of central  pulmonary artery embolus. 2. Small to moderate bilateral pleural effusions with associated partial compressive atelectasis of the lower lobes. Pneumonia is not excluded clinical correlation is recommended. 3. Extensive retroperitoneal, mesenteric, and omental adenopathy in keeping with history of lymphoma. Enlarged lymph nodes in the anterior cardiophrenic space. 4. Moderate ascites. 5. Mild left hydronephrosis. 6. Aortic Atherosclerosis (ICD10-I70.0). Electronically Signed   By: Anner Crete M.D.   On: 06/24/2020 16:02   NM PET Image Initial (PI) Skull Base To Thigh  Result Date: 06/21/2020 CLINICAL DATA:  Initial treatment strategy for non-Hodgkin lymphoma. EXAM: NUCLEAR MEDICINE PET SKULL BASE TO THIGH TECHNIQUE: 9.5 mCi F-18 FDG was injected intravenously. Full-ring PET imaging was performed from the skull base to thigh after the radiotracer. CT data was obtained and used for attenuation correction and anatomic localization. Fasting blood glucose: 90 mg/dl COMPARISON:  CT abdomen pelvis 06/05/2020, coronary CT 01/28/2020 and CT chest 11/17/2017. FINDINGS: Mediastinal blood pool activity: SUV max 0.8 Liver activity: SUV max 3.2 NECK: Mild hypermetabolic lymph  nodes in the neck are seen with an index left level 2 lymph node measuring approximately 6 mm (4/27) and fall SUV max of 4.0. Incidental CT findings: None. CHEST: Hypermetabolic low internal jugular mediastinal, internal mammary and axillary lymph nodes. Index low left paratracheal lymph measures 8 mm (4/64) with an SUV max of 6.8. Clustered prepericardiac lymph nodes measure up to 12 mm (4/90) with an SUV max of 10.0. No hypermetabolic pulmonary nodules. Incidental CT findings: Coronary artery calcification. Heart is mildly enlarged. No pericardial effusion. Moderate bilateral pleural effusions with compressive atelectasis in both lower lobes. ABDOMEN/PELVIS: Hypermetabolic adenopathy is seen throughout the abdominal peritoneal ligaments,  retroperitoneum and mesenteries. Conglomerate nodal mesenteric mass is difficult to differentiate from small bowel, which may be involved, with an SUV max of 14.4, measuring approximately 8.6 x 12.6 cm (4/137). Index gastrohepatic ligament lymph node measures approximately 2.3 cm (4/110) with an SUV max of 11.4. Extensive hypermetabolic peritoneal/omental thickening/caking, with an index 1.9 x 3.6 cm left omental nodule (4/121), SUV max 11.8. Inguinal lymph nodes measure up to 1.5 cm on the left (4/202) with an SUV max of 1.7. No abnormal hypermetabolism in the liver, adrenal glands, spleen or pancreas. Incidental CT findings: Visualized portion of the liver is grossly unremarkable. There may be sludge in the gallbladder. Adrenal glands and right kidney are grossly unremarkable. There may be a punctate stone in the lower pole left kidney. Left pelvicaliceal fullness versus mild hydronephrosis. Spleen is grossly unremarkable. Bladder appears thick-walled but is under distended. Ascites. SKELETON: No abnormal osseous hypermetabolism. Incidental CT findings: Degenerative changes in the spine. IMPRESSION: 1. Hypermetabolic adenopathy throughout the neck, chest, abdomen and pelvis. Findings are worst in the abdomen, with extensive peritoneal carcinomatosis. Small bowel involvement is not excluded. Findings are consistent with the provided history of lymphoma, [place] 5. 2. Moderate bilateral pleural effusions with compressive atelectasis in both lower lobes. 3. Left pelvicaliceal fullness versus mild hydronephrosis. 4. Question punctate left renal stone. Electronically Signed   By: Lorin Picket M.D.   On: 06/21/2020 09:04   US Abdomen Limited  Result Date: 06/29/2020 CLINICAL DATA:  Question ascites. EXAM: LIMITED ABDOMEN ULTRASOUND FOR ASCITES TECHNIQUE: Limited ultrasound survey for ascites was performed in all four abdominal quadrants. COMPARISON:  CT 06/24/2020 FINDINGS: Four-quadrant scanning shows a small  amount of ascites without any accessible collection. This appears considerably diminished in comparison to the CT of 5 days ago. IMPRESSION: Small amount of ascites. Amount appears considerably reduced compared with a CT scan of 5 days ago. Electronically Signed   By: Nelson Chimes M.D.   On: 06/29/2020 15:24   IR US Guide Bx Asp/Drain  Result Date: 06/26/2020 INDICATION: Concern for lymphoma. Please perform ultrasound-guided biopsy of hypermetabolic inguinal lymph node for tissue diagnostic purposes Additionally, patient currently admitted with abdominal pain and distension and as such request made for ascites search ultrasound ultrasound-guided paracentesis for diagnostic and therapeutic purposes as indicated. EXAM: 1. ULTRASOUND-GUIDED RIGHT INGUINAL LYMPH NODE BIOPSY 2. ULTRASOUND-GUIDED PARACENTESIS COMPARISON:  CT of the chest, abdomen and pelvis- 06/24/2020; PET-CT-06/21/2020 MEDICATIONS: None. COMPLICATIONS: None immediate. TECHNIQUE: Informed written consent was obtained from the patient after a discussion of the risks, benefits and alternatives to treatment. A timeout was performed prior to the initiation of the procedure. Initial ultrasound scanning demonstrates a small amount of intra-abdominal ascites with dominant pocket located within right upper abdomen adjacent to the hepatic parenchyma. As such, the skin overlying the right upper abdomen was subsequently prepped and draped in the usual  sterile fashion. 1% lidocaine with epinephrine was used for local anesthesia. Under direct ultrasound guidance, an 8 Fr Safe-T-Centesis catheter was introduced. The paracentesis was performed. A small amount of aspirated fluid was capped and sent to the laboratory for cytologic analysis as well as triglyceride levels. The catheter was removed and a dressing was applied. Next, attention was paid towards ultrasound-guided biopsy of known hypermetabolic right inguinal lymphadenopathy. Sonographic evaluation of the  right groin demonstrates an at least 2.4 x 0.8 cm right inguinal lymph node correlating with the dominant right inguinal lymph node seen on preceding abdominal CT image 90, series 2. The procedure was planned. The right groin was prepped and draped in usual sterile fashion. After the overlying soft tissues anesthetized 1% lidocaine with epinephrine, an 18 gauge core needle biopsy device was utilized to obtain 6 core needle biopsy samples. Multiple ultrasound images were saved procedural documentation purposes. Samples were placed in saline and submitted to the laboratory for pathologic analysis. The patient tolerated the above procedures well without immediate postprocedural complication. FINDINGS: A total of approximately 2 liters of chylous fluid was removed. Samples were sent to the laboratory as requested by the clinical team. Successful ultrasound-guided biopsy dominant right inguinal lymph node IMPRESSION: 1. Successful ultrasound-guided biopsy of dominant right inguinal node. 2. Successful ultrasound-guided paracentesis yielding 2 liters of chylous appearing fluid. Fluid was sent to the laboratory for cytologic analysis as well as triglyceride levels. Electronically Signed   By: Sandi Mariscal M.D.   On: 06/26/2020 13:16   DG Chest Port 1 View  Result Date: 06/24/2020 CLINICAL DATA:  Abdominal pain and swelling.  History of lymphoma. EXAM: PORTABLE CHEST 1 VIEW COMPARISON:  Chest radiograph 11/17/2017 FINDINGS: Normal cardiac and mediastinal contours. Small bilateral pleural effusions with underlying consolidation. No pleural effusion or pneumothorax. Osseous structures unremarkable. IMPRESSION: Small bilateral pleural effusions with underlying consolidation. Electronically Signed   By: Lovey Newcomer M.D.   On: 06/24/2020 14:11   ECHOCARDIOGRAM COMPLETE  Result Date: 06/25/2020    ECHOCARDIOGRAM REPORT   Patient Name:   George Proctor Date of Exam: 06/25/2020 Medical Rec #:  [id number]      Height:        73.0 in Accession #:    [id number]     Weight:       189.0 lb Date of Birth:  [birth year]      BSA:          2.101 m Patient Age:    69 years       BP:           136/92 mmHg Patient Gender: M              HR:           120 bpm. Exam Location:  Inpatient Procedure: 2D Echo Indications:    786.09 dyspnea  History:        Patient has prior history of Echocardiogram examinations, most                 recent 08/11/2019. Lymphoma.  Sonographer:    Jannett Celestine RDCS (AE) Referring Phys: [phone] [facility]  Sonographer Comments: Suboptimal parasternal window and suboptimal subcostal window. IMPRESSIONS  1. Left ventricular ejection fraction, by estimation, is 65 to 70%. The left ventricle has normal function. The left ventricle has no regional wall motion abnormalities. Left ventricular diastolic parameters are consistent with Grade I diastolic dysfunction (impaired relaxation).  2. Right ventricular systolic function is hyperdynamic. The right  ventricular size is normal.  3. The mitral valve is normal in structure. No evidence of mitral valve regurgitation. No evidence of mitral stenosis.  4. The aortic valve is normal in structure. Aortic valve regurgitation is not visualized. No aortic stenosis is present. FINDINGS  Left Ventricle: Left ventricular ejection fraction, by estimation, is 65 to 70%. The left ventricle has normal function. The left ventricle has no regional wall motion abnormalities. The left ventricular internal cavity size was normal in size. There is  no left ventricular hypertrophy. Left ventricular diastolic parameters are consistent with Grade I diastolic dysfunction (impaired relaxation). Right Ventricle: The right ventricular size is normal. No increase in right ventricular wall thickness. Right ventricular systolic function is hyperdynamic. Left Atrium: Left atrial size was normal in size. Right Atrium: Right atrial size was normal in size. Pericardium: There is no evidence of pericardial  effusion. Mitral Valve: The mitral valve is normal in structure. No evidence of mitral valve regurgitation. No evidence of mitral valve stenosis. Tricuspid Valve: The tricuspid valve is normal in structure. Tricuspid valve regurgitation is trivial. No evidence of tricuspid stenosis. Aortic Valve: The aortic valve is normal in structure. Aortic valve regurgitation is not visualized. No aortic stenosis is present. Pulmonic Valve: The pulmonic valve was normal in structure. Pulmonic valve regurgitation is not visualized. No evidence of pulmonic stenosis. Aorta: The aortic root is normal in size and structure. Venous: The inferior vena cava was not well visualized. IAS/Shunts: No atrial level shunt detected by color flow Doppler.  LEFT VENTRICLE PLAX 2D LVOT diam:     2.50 cm LV SV:         72 LV SV Index:   34 LVOT Area:     4.91 cm  RIGHT VENTRICLE RV S prime:     18.00 cm/s TAPSE (M-mode): 1.8 cm LEFT ATRIUM             Index       RIGHT ATRIUM           Index LA Vol (A2C):   60.4 ml 28.75 ml/m RA Area:     15.80 cm LA Vol (A4C):   42.5 ml 20.23 ml/m RA Volume:   32.40 ml  15.42 ml/m LA Biplane Vol: 54.0 ml 25.71 ml/m  AORTIC VALVE LVOT Vmax:   95.30 cm/s LVOT Vmean:  74.500 cm/s LVOT VTI:    0.146 m MITRAL VALVE               TRICUSPID VALVE MV Area (PHT): 3.31 cm    TR Peak grad:   13.7 mmHg MV Decel Time: 229 msec    TR Vmax:        185.00 cm/s MV E velocity: 69.40 cm/s MV A velocity: 87.00 cm/s  SHUNTS MV E/A ratio:  0.80        Systemic VTI:  0.15 m                            Systemic Diam: 2.50 cm Skeet Latch MD Electronically signed by Skeet Latch MD Signature Date/Time: 06/25/2020/4:06:39 PM    Final    IR IMAGING GUIDED PORT INSERTION  Result Date: 06/30/2020 CLINICAL DATA:  High-grade follicular lymphoma and need for porta cath to begin chemotherapy. EXAM: IMPLANTED PORT A CATH PLACEMENT WITH ULTRASOUND AND FLUOROSCOPIC GUIDANCE ANESTHESIA/SEDATION: 4.0 mg IV Versed; 100 mcg IV  Fentanyl Total Moderate Sedation Time:  32 minutes The patient's level of consciousness and  physiologic status were continuously monitored during the procedure by Radiology nursing. Additional Medications: 2 g IV Ancef. FLUOROSCOPY TIME:  24 seconds.  9.0 mGy. PROCEDURE: The procedure, risks, benefits, and alternatives were explained to the patient. Questions regarding the procedure were encouraged and answered. The patient understands and consents to the procedure. A time-out was performed prior to initiating the procedure. Ultrasound was utilized to confirm patency of the right internal jugular vein. The right neck and chest were prepped with chlorhexidine in a sterile fashion, and a sterile drape was applied covering the operative field. Maximum barrier sterile technique with sterile gowns and gloves were used for the procedure. Local anesthesia was provided with 1% lidocaine. After creating a small venotomy incision, a 21 gauge needle was advanced into the right internal jugular vein under direct, real-time ultrasound guidance. Ultrasound image documentation was performed. After securing guidewire access, an 8 Fr dilator was placed. A J-wire was kinked to measure appropriate catheter length. A subcutaneous port pocket was then created along the upper chest wall utilizing sharp and blunt dissection. Portable cautery was utilized. The pocket was irrigated with sterile saline. A single lumen power injectable port was chosen for placement. The 8 Fr catheter was tunneled from the port pocket site to the venotomy incision. The port was placed in the pocket. External catheter was trimmed to appropriate length based on guidewire measurement. At the venotomy, an 8 Fr peel-away sheath was placed over a guidewire. The catheter was then placed through the sheath and the sheath removed. Final catheter positioning was confirmed and documented with a fluoroscopic spot image. The port was accessed with a needle and aspirated  and flushed with heparinized saline. The access needle was removed. The venotomy and port pocket incisions were closed with subcutaneous 3-0 Monocryl and subcuticular 4-0 Vicryl. Dermabond was applied to both incisions. COMPLICATIONS: COMPLICATIONS None FINDINGS: After catheter placement, the tip lies at the cavo-atrial junction. The catheter aspirates normally and is ready for immediate use. IMPRESSION: Placement of single lumen port a cath via right internal jugular vein. The catheter tip lies at the cavo-atrial junction. A power injectable port a cath was placed and is ready for immediate use. Electronically Signed   By: Aletta Edouard M.D.   On: 06/30/2020 16:58   VAS [place] LOWER EXTREMITY VENOUS (DVT)  Result Date: 06/29/2020  Lower Venous DVTStudy Indications: Edema.  Risk Factors: Underlying diagnosis of Non-Hodgkin's lymphoma. Limitations: Poor ultrasound/tissue interface. Performing Technologist: Maudry Mayhew MHA, RDMS, RVT, RDCS  Examination Guidelines: A complete evaluation includes B-mode imaging, spectral Doppler, color Doppler, and power Doppler as needed of all accessible portions of each vessel. Bilateral testing is considered an integral part of a complete examination. Limited examinations for reoccurring indications may be performed as noted. The reflux portion of the exam is performed with the patient in reverse Trendelenburg.  +---------+---------------+---------+-----------+----------+--------------+ RIGHT    CompressibilityPhasicitySpontaneityPropertiesThrombus Aging +---------+---------------+---------+-----------+----------+--------------+ CFV      Full           Yes      Yes                                 +---------+---------------+---------+-----------+----------+--------------+ SFJ      Full                                                        +---------+---------------+---------+-----------+----------+--------------+  FV Prox  Full                                                         +---------+---------------+---------+-----------+----------+--------------+ FV Mid   Full                                                        +---------+---------------+---------+-----------+----------+--------------+ FV DistalFull                                                        +---------+---------------+---------+-----------+----------+--------------+ PFV      Full                                                        +---------+---------------+---------+-----------+----------+--------------+ POP      Full           Yes      Yes                                 +---------+---------------+---------+-----------+----------+--------------+ PTV      Full                                                        +---------+---------------+---------+-----------+----------+--------------+ PERO     Full                                                        +---------+---------------+---------+-----------+----------+--------------+   +----+---------------+---------+-----------+----------+--------------+ LEFTCompressibilityPhasicitySpontaneityPropertiesThrombus Aging +----+---------------+---------+-----------+----------+--------------+ CFV Full           Yes      Yes                                 +----+---------------+---------+-----------+----------+--------------+     Summary: RIGHT: - There is no evidence of deep vein thrombosis in the lower extremity.  - No cystic structure found in the popliteal fossa. - Ultrasound characteristics of enlarged lymph nodes are noted in the groin.  LEFT: - No evidence of common femoral vein obstruction. - Ultrasound characteristics of enlarged lymph nodes noted in the groin.  *See table(s) above for measurements and observations. Electronically signed by Servando Snare MD on 06/29/2020 at 5:37:15 PM.    Final    IR Paracentesis  Result Date: 06/26/2020 INDICATION: Concern for lymphoma.  Please perform ultrasound-guided biopsy of hypermetabolic inguinal lymph node for tissue diagnostic purposes Additionally, patient currently admitted with  abdominal pain and distension and as such request made for ascites search ultrasound ultrasound-guided paracentesis for diagnostic and therapeutic purposes as indicated. EXAM: 1. ULTRASOUND-GUIDED RIGHT INGUINAL LYMPH NODE BIOPSY 2. ULTRASOUND-GUIDED PARACENTESIS COMPARISON:  CT of the chest, abdomen and pelvis- 06/24/2020; PET-CT-06/21/2020 MEDICATIONS: None. COMPLICATIONS: None immediate. TECHNIQUE: Informed written consent was obtained from the patient after a discussion of the risks, benefits and alternatives to treatment. A timeout was performed prior to the initiation of the procedure. Initial ultrasound scanning demonstrates a small amount of intra-abdominal ascites with dominant pocket located within right upper abdomen adjacent to the hepatic parenchyma. As such, the skin overlying the right upper abdomen was subsequently prepped and draped in the usual sterile fashion. 1% lidocaine with epinephrine was used for local anesthesia. Under direct ultrasound guidance, an 8 Fr Safe-T-Centesis catheter was introduced. The paracentesis was performed. A small amount of aspirated fluid was capped and sent to the laboratory for cytologic analysis as well as triglyceride levels. The catheter was removed and a dressing was applied. Next, attention was paid towards ultrasound-guided biopsy of known hypermetabolic right inguinal lymphadenopathy. Sonographic evaluation of the right groin demonstrates an at least 2.4 x 0.8 cm right inguinal lymph node correlating with the dominant right inguinal lymph node seen on preceding abdominal CT image 90, series 2. The procedure was planned. The right groin was prepped and draped in usual sterile fashion. After the overlying soft tissues anesthetized 1% lidocaine with epinephrine, an 18 gauge core needle biopsy device was utilized  to obtain 6 core needle biopsy samples. Multiple ultrasound images were saved procedural documentation purposes. Samples were placed in saline and submitted to the laboratory for pathologic analysis. The patient tolerated the above procedures well without immediate postprocedural complication. FINDINGS: A total of approximately 2 liters of chylous fluid was removed. Samples were sent to the laboratory as requested by the clinical team. Successful ultrasound-guided biopsy dominant right inguinal lymph node IMPRESSION: 1. Successful ultrasound-guided biopsy of dominant right inguinal node. 2. Successful ultrasound-guided paracentesis yielding 2 liters of chylous appearing fluid. Fluid was sent to the laboratory for cytologic analysis as well as triglyceride levels. Electronically Signed   By: Sandi Mariscal M.D.   On: 06/26/2020 13:16    ASSESSMENT & PLAN:   64 year old gentleman with  #1 extensive cervical thoracic and primarily abdominal pelvic lymphadenopathy consistent with high-grade follicular lymphoma. #2 abdominal distention and discomfort along with newly noted chylous effusion showing lymphocytic predominance. Patient status post paracentesis with removal of 2 L of fluid with improvement in symptoms. #3 shortness of breath-echo shows normal ejection fraction.  CT chest negative for PE or pneumonia.  Negative enzymes.  Plan -Status post inguinal lymph node biopsy as well as paracentesis with removal of 2 L of chylous effusion on 06/26/2020 with improvement in his abdominal discomfort and shortness of breath.  He continues to have abdominal distention and some mild shortness of breath.  He was reevaluated by interventional radiology yesterday for possible paracentesis, but not enough fluid to draw off and procedure was held. -He has ongoing lower extremity edema, but now the right leg is more edematous than the left.  Bilateral Doppler ultrasound of lower extremities was negative for DVT.  Edema  likely due to worsening adenopathy.   -Pathology report consistent with high-grade follicular B-cell lymphoma.  The patient is symptomatic and is at high risk for TLS and has ascites so we will plan to give first cycle of chemotherapy as an inpatient.  Plan to proceed with R-CHOP  once Port-A-Cath is placed and he is transferred to the oncology unit for chemotherapy administration.  Discussed with inpatient oncology unit and chemotherapy will be given 10/16.  Will plan to hold rituximab over the weekend.  If he remains in the hospital, will plan to give rituximab early next week.  However, if he is discharged will arrange for outpatient rituximab in our office next week.  I have also tentatively scheduled him for an outpatient Neulasta or Udenyca injection in our office on 10/18 if he is discharged. -Adverse effects of chemotherapy have been discussed including but not limited to alopecia, myelosuppression, nausea, vomiting, cardiotoxicity with Adriamycin, renal and hepatic dysfunction, risk of infusion reaction with rituximab.  The patient is agreeable to proceed. -Uric acid level was elevated and he was started on allopurinol due to concern for tumor lysis syndrome.  Uric acid level has normalized. -Lorazepam as needed for insomnia or anxiety or tremulousness related to high-dose steroids. - plan for Rituxan and Udenyca as outpatient on Monday, 07/03/2020. -recommend daily CBC, CMP and LDH levels to monitor for tumor lysis. -Continue allopurinol 100 mg p.o. twice daily. - continue prednisone 60 mg p.o. daily as part of his R-CHOP regimen.  Ledell Peoples, MD Department of Hematology/Oncology [place] at [facility] Phone: [phone] Pager: [phone] Email: [email or web address]

## 2020-07-01 NOTE — Progress Notes (Signed)
Dosing of Doxorubicin ,vincristine and cyclophosphamide  Verified by Clotilde Dieter, RN

## 2020-07-01 NOTE — Progress Notes (Signed)
[facility]  PROGRESS NOTE  George Proctor YKD:[id number] DOB: 1956/03/07 DOA: 06/24/2020 PCP: [name], MD   Brief HPI:   64 year old male with past medical history of anxiety, depression, GERD came to ED with complaints of abdominal pain, dyspnea and orthopnea.  Patient was seen by Dr. [name],  oncology on 06/15/2020 for widespread lymphadenopathy.  Patient underwent PET/CT scan on 06/21/2020 with findings of hypermetabolic lesions throughout the body mostly in the abdominal suspicion for peritoneal carcinomatosis.   Patient came to ED with worsening abdominal pain, and swelling.  He was noted to be tachypneic with tachycardia.  Chest x-ray showed blunting of bilateral costophrenic angles.  CTA chest did not show PE.  Did have small bilateral pleural effusion with partial atelectasis of the lower lobes.   Patient underwent ultrasound-guided biopsy of right inguinal lymph node as well as ultrasound guided paracentesis yielding 2 L of chylous appearing fluid on 06/26/2020.  Biopsy results pending.  Subjective   Patient seen and examined, denies any complaints.  Plan for for cycle of R-CHOP today.   Assessment/Plan:     1. High-grade follicular B-cell lymphoma--PET/CT scan showed extensive intra-abdominal lymphadenopathy, peritoneal carcinomatosis.  Patient underwent right inguinal lymph node biopsy as well as paracentesis yielding chylous fluid.  Peritoneal fluid analysis shows non-Hodgkin's B-cell lymphoma.  Right inguinal tissue biopsy specimen was insufficient for analysis.  S/p Port-A-Cath placement.  Continue high-dose prednisone 80 mg daily.  Oncology has started palliative chemotherapy with R-CHOP.  1st dose today.    2. Generalized lymphadenopathy- secondary to  lymphoma as above.  Hepatitis B and C were negative.  HIV was negative. 3. Peripheral edema-likely from anasarca , albumin is 3.2.  Also could be from widespread lymphadenopathy. 4. Dyspnea-improved, chest x-ray showed  mild to moderate pleural effusion.  He underwent paracentesis which improved dyspnea.    Echocardiogram showed EF 60 to 70% with grade 1 diastolic dysfunction.  Patient did receive 1 dose of Lasix.  He is currently on 1 L/min  oxygen via nasal cannula. 5. ?  Tumor lysis syndrome-uric acid level elevated at 13.1.  Patient started on allopurinol.  Uric acid has come down to 10.3.     COVID-19 Labs  No results for input(s): DDIMER, FERRITIN, LDH, CRP in the last 72 hours.  Lab Results  Component Value Date   [place] NEGATIVE 06/24/2020     Scheduled medications:   . allopurinol  100 mg Oral BID  . Chlorhexidine Gluconate Cloth  6 each Topical Daily  . cyclophosphamide  750 mg/m2 (Treatment Plan Recorded) Intravenous Once  . DOXOrubicin  50 mg/m2 (Treatment Plan Recorded) Intravenous Once  . enoxaparin (LOVENOX) injection  40 mg Subcutaneous Q24H  . palonosetron  0.25 mg Intravenous Once  . pantoprazole  40 mg Oral Daily  . predniSONE  60 mg Oral Q breakfast  . senna  1 tablet Oral BID  . vinCRIStine (ONCOVIN) CHEMO IV infusion  2 mg Intravenous Once         CBG: No results for input(s): GLUCAP in the last 168 hours.  SpO2: 92 % O2 Flow Rate (L/min): 4 L/min    CBC: Recent Labs  Lab 06/24/20 1345 06/25/20 0305 06/26/20 0326 06/27/20 0742 06/28/20 0311  WBC 5.4 4.5 5.1 4.9 5.3  NEUTROABS 4.4  --   --  4.3  --   HGB 14.8 14.0 14.1 13.4 12.3*  HCT 44.4 42.5 43.9 41.1 36.9*  MCV 85.5 86.6 87.6 85.4 84.8  PLT 303 278 297 311  157    Basic Metabolic Panel: Recent Labs  Lab 06/25/20 0305 06/26/20 0326 06/27/20 0742 06/28/20 0311 06/29/20 0320  NA 134* 134* 135 135 139  K 4.5 4.0 4.6 4.4 4.3  CL 99 96* 98 100 102  CO2 24 25 26 26 27   GLUCOSE 91 93 124* 116* 99  BUN 13 13 23 23 19   CREATININE 1.05 1.17 1.17 1.10 0.90  CALCIUM 8.7* 8.9 8.9 8.5* 8.5*  MG 2.0 1.9  --  2.3  --   PHOS 4.2  --   --   --   --      Liver Function Tests: Recent Labs  Lab  06/24/20 1345 06/25/20 0305 06/27/20 0742 06/29/20 0320  AST 97* 88* 70* 75*  ALT 30 29 25  32  ALKPHOS 126 113 105 86  BILITOT 0.9 0.8 0.4 0.4  PROT 6.3* 5.8* 5.7* 5.1*  ALBUMIN 3.5 3.3* 3.2* 3.1*     Antibiotics: Anti-infectives (From admission, onward)   Start     Dose/Rate Route Frequency Ordered Stop   06/30/20 1500  ceFAZolin (ANCEF) IVPB 2g/100 mL premix        2 g 200 mL/hr over 30 Minutes Intravenous  Once 06/30/20 1411 06/30/20 1445       DVT prophylaxis: Lovenox  Code Status: Full code  Family Communication: No family at bedside    Status is: Inpatient  Dispo: The patient is from: Home              Anticipated d/c is to: Home              Anticipated d/c date is: 07/03/2020              Patient currently not medically stable for discharge  Barrier to discharge-ongoing work-up for lymphoma      Consultants:  Oncology  Procedures:  Right inguinal lymph node biopsy, ultrasound-guided paracentesis   Objective   Vitals:   06/30/20 1952 06/30/20 2339 07/01/20 0350 07/01/20 0545  BP: 109/78 111/74 108/68 132/87  Pulse: (!) 109 (!) 107 (!) 105 (!) 110  Resp: 16 16 16 16   Temp: 97.6 F (36.4 C) 97.7 F (36.5 C) (!) 97.4 F (36.3 C) 98 F (36.7 C)  TempSrc: Oral Oral Oral Oral  SpO2: 93% 94% 96% 92%  Weight:      Height:        Intake/Output Summary (Last 24 hours) at 07/01/2020 0853 Last data filed at 07/01/2020 2620 Gross per 24 hour  Intake 360 ml  Output --  Net 360 ml    10/14 1901 - 10/16 0700 In: 960 [P.O.:960] Out: -   Filed Weights   06/24/20 1226  Weight: 85.7 kg    Physical Examination:    General-appears in no acute distress  Heart-S1-S2, regular, no murmur auscultated  Lungs-clear to auscultation bilaterally, no wheezing or crackles auscultated  Abdomen-soft, nontender, no organomegaly  Extremities-no edema in the lower extremities  Neuro-alert, oriented x3, no focal deficit noted    Data Reviewed:    Recent Results (from the past 240 hour(s))  Blood culture (routine single)     Status: None   Collection Time: 06/24/20  1:45 PM   Specimen: BLOOD  Result Value Ref Range Status   Specimen Description   Final    BLOOD Performed at [facility], [place] [address], [place], [place] [address]    Special Requests   Final    NONE Performed at [facility],  [place] [address], [place], [place] [address]    Culture   Final    NO GROWTH 5 DAYS Performed at [facility], [place] [address], [place], [place] [address]    Report Status 06/29/2020 FINAL  Final  Respiratory Panel by RT PCR (Flu A&B, Covid) - Nasopharyngeal Swab     Status: None   Collection Time: 06/24/20  1:45 PM   Specimen: Nasopharyngeal Swab  Result Value Ref Range Status   SARS Coronavirus 2 by RT PCR NEGATIVE NEGATIVE Final    Comment: (NOTE) SARS-CoV-2 target nucleic acids are NOT DETECTED.  The SARS-CoV-2 RNA is generally detectable in upper respiratoy specimens during the acute phase of infection. The lowest concentration of SARS-CoV-2 viral copies this assay can detect is 131 copies/mL. A negative result does not preclude SARS-Cov-2 infection and should not be used as the sole basis for treatment or other patient management decisions. A negative result may occur with  improper specimen collection/handling, submission of specimen other than nasopharyngeal swab, presence of viral mutation(s) within the areas targeted by this assay, and inadequate number of viral copies (<131 copies/mL). A negative result must be combined with clinical observations, patient history, and epidemiological information. The expected result is Negative.  Fact Sheet for Patients:  PinkCheek.be  Fact Sheet for Healthcare Providers:  GravelBags.it  This test is no t yet approved or cleared by the [place] FDA and  has been authorized for  detection and/or diagnosis of SARS-CoV-2 by FDA under an Emergency Use Authorization (EUA). This EUA will remain  in effect (meaning this test can be used) for the duration of the COVID-19 declaration under Section 564(b)(1) of the Act, 21 U.S.C. section 360bbb-3(b)(1), unless the authorization is terminated or revoked sooner.     Influenza A by PCR NEGATIVE NEGATIVE Final   Influenza B by PCR NEGATIVE NEGATIVE Final    Comment: (NOTE) The Xpert Xpress SARS-CoV-2/FLU/RSV assay is intended as an aid in  the diagnosis of influenza from Nasopharyngeal swab specimens and  should not be used as a sole basis for treatment. Nasal washings and  aspirates are unacceptable for Xpert Xpress SARS-CoV-2/FLU/RSV  testing.  Fact Sheet for Patients: PinkCheek.be  Fact Sheet for Healthcare Providers: GravelBags.it  This test is not yet approved or cleared by the [place] FDA and  has been authorized for detection and/or diagnosis of SARS-CoV-2 by  FDA under an Emergency Use Authorization (EUA). This EUA will remain  in effect (meaning this test can be used) for the duration of the  Covid-19 declaration under Section 564(b)(1) of the Act, 21  U.S.C. section 360bbb-3(b)(1), unless the authorization is  terminated or revoked. Performed at [facility], [place] [address], [place], [place] [address]   Urine culture     Status: None   Collection Time: 06/24/20  5:28 PM   Specimen: In/Out Cath Urine  Result Value Ref Range Status   Specimen Description   Final    IN/OUT CATH URINE Performed at [place] [address] [place], [place] [address]    Special Requests   Final    NONE Performed at [facility], [place] [address] [place], [place] [address]    Culture   Final    NO GROWTH Performed at [facility], [place] [address] [place], [place] [address]    Report Status  06/26/2020 FINAL  Final  Body fluid culture     Status: None   Collection  Time: 06/26/20 11:40 AM   Specimen: PATH Cytology Peritoneal fluid  Result Value Ref Range Status   Specimen Description   Final    PERITONEAL Performed at [facility] [address] [place], [place] [address]    Special Requests   Final    NONE Performed at [facility], [place] [address], [place], [place] [address]    Gram Stain   Final    RARE WBC PRESENT, PREDOMINANTLY MONONUCLEAR NO ORGANISMS SEEN    Culture   Final    NO GROWTH 3 DAYS Performed at [facility], [place] [address] [place], [place] [address]    Report Status 06/29/2020 FINAL  Final    No results for input(s): LIPASE, AMYLASE in the last 168 hours. No results for input(s): AMMONIA in the last 168 hours.  Cardiac Enzymes: No results for input(s): CKTOTAL, CKMB, CKMBINDEX, TROPONINI in the last 168 hours. BNP (last 3 results) Recent Labs    06/25/20 0300  BNP 29.7        [place]   [facility] If 7PM-7AM, please contact night-coverage at [email or web address], Office  [phone]   07/01/2020, 8:53 AM  LOS: 7 days

## 2020-07-02 DIAGNOSIS — R1084 Generalized abdominal pain: Secondary | ICD-10-CM | POA: Diagnosis not present

## 2020-07-02 DIAGNOSIS — R109 Unspecified abdominal pain: Secondary | ICD-10-CM | POA: Diagnosis not present

## 2020-07-02 DIAGNOSIS — C8248 Follicular lymphoma grade IIIb, lymph nodes of multiple sites: Secondary | ICD-10-CM | POA: Diagnosis not present

## 2020-07-02 DIAGNOSIS — R591 Generalized enlarged lymph nodes: Secondary | ICD-10-CM | POA: Diagnosis not present

## 2020-07-02 LAB — COMPREHENSIVE METABOLIC PANEL
ALT: 20 U/L (ref 0–44)
AST: 54 U/L — ABNORMAL HIGH (ref 15–41)
Albumin: 2.9 g/dL — ABNORMAL LOW (ref 3.5–5.0)
Alkaline Phosphatase: 75 U/L (ref 38–126)
Anion gap: 8 (ref 5–15)
BUN: 28 mg/dL — ABNORMAL HIGH (ref 8–23)
CO2: 26 mmol/L (ref 22–32)
Calcium: 8.3 mg/dL — ABNORMAL LOW (ref 8.9–10.3)
Chloride: 101 mmol/L (ref 98–111)
Creatinine, Ser: 0.74 mg/dL (ref 0.61–1.24)
GFR, Estimated: >60 mL/min (ref >60)
Glucose, Bld: 102 mg/dL — ABNORMAL HIGH (ref 70–99)
Potassium: 4.2 mmol/L (ref 3.5–5.1)
Sodium: 135 mmol/L (ref 135–145)
Total Bilirubin: 0.7 mg/dL (ref 0.3–1.2)
Total Protein: 5 g/dL — ABNORMAL LOW (ref 6.5–8.1)

## 2020-07-02 LAB — CBC
HCT: 37.9 % — ABNORMAL LOW (ref 39.0–52.0)
Hemoglobin: 12.6 g/dL — ABNORMAL LOW (ref 13.0–17.0)
MCH: 28.4 pg (ref 26.0–34.0)
MCHC: 33.2 g/dL (ref 30.0–36.0)
MCV: 85.6 fL (ref 80.0–100.0)
Platelets: 304 10*3/uL (ref 150–400)
RBC: 4.43 MIL/uL (ref 4.22–5.81)
RDW: 13.3 % (ref 11.5–15.5)
WBC: 7.7 10*3/uL (ref 4.0–10.5)
nRBC: 0 % (ref 0.0–0.2)

## 2020-07-02 LAB — LACTATE DEHYDROGENASE: LDH: 647 U/L — ABNORMAL HIGH (ref 98–192)

## 2020-07-02 MED ORDER — HEPARIN SOD (PORK) LOCK FLUSH 100 UNIT/ML IV SOLN
500.0000 [IU] | INTRAVENOUS | Status: DC
  Administered 2020-07-02: 500 [IU]

## 2020-07-02 MED ORDER — PREDNISONE 20 MG PO TABS: 60.0000 mg | ORAL_TABLET | Freq: Every day | ORAL | 5 refills | Status: DC

## 2020-07-02 MED ORDER — HEPARIN SOD (PORK) LOCK FLUSH 100 UNIT/ML IV SOLN
500.0000 [IU] | INTRAVENOUS | Status: DC | PRN
  Filled 2020-07-02: qty 5

## 2020-07-02 MED ORDER — ALLOPURINOL 100 MG PO TABS: 100.0000 mg | ORAL_TABLET | Freq: Two times a day (BID) | ORAL | 2 refills | Status: DC

## 2020-07-02 MED ORDER — INFLUENZA VAC SPLIT QUAD 0.5 ML IM SUSY
0.5000 mL | PREFILLED_SYRINGE | INTRAMUSCULAR | Status: AC
  Administered 2020-07-02: 0.5 mL via INTRAMUSCULAR
  Filled 2020-07-02: qty 0.5

## 2020-07-02 MED ORDER — ONDANSETRON HCL 8 MG PO TABS: 8.0000 mg | ORAL_TABLET | Freq: Three times a day (TID) | ORAL | 1 refills | Status: DC | PRN

## 2020-07-02 NOTE — Discharge Summary (Signed)
Physician Discharge Summary  George Proctor UKG:[id number] DOB: 11-26-1955 DOA: 06/24/2020  PCP: [name], MD  Admit date: 06/24/2020 Discharge date: 07/02/2020  Time spent:  50 minutes  Recommendations for Outpatient Follow-up:  1. Follow-up oncology next week, patient has an appointment with oncology on 07/03/2020 as outpatient   Discharge Diagnoses:  Principal Problem:   Abdominal pain Active Problems:   Anxiety   Attention deficit hyperactivity disorder (ADHD)   GERD   SOB (shortness of breath)   Major depressive disorder   Lymphoma of lymph nodes of multiple regions ([place])   Other ascites   Encounter for antineoplastic chemotherapy   Discharge Condition: Stable  Diet recommendation: Heart healthy diet  Filed Weights   06/24/20 1226  Weight: 85.7 kg    History of present illness:  *64 year old male with past medical history of anxiety, depression, GERD came to ED with complaints of abdominal pain, dyspnea and orthopnea.  Patient was seen by Dr. [name],  oncology on 06/15/2020 for widespread lymphadenopathy.  Patient underwent PET/CT scan on 06/21/2020 with findings of hypermetabolic lesions throughout the body mostly in the abdominal suspicion for peritoneal carcinomatosis.   Patient came to ED with worsening abdominal pain, and swelling.  He was noted to be tachypneic with tachycardia.  Chest x-ray showed blunting of bilateral costophrenic angles.  CTA chest did not show PE.  Did have small bilateral pleural effusion with partial atelectasis of the lower lobes.   Patient underwent ultrasound-guided biopsy of right inguinal lymph node as well as ultrasound guided paracentesis yielding 2 L of chylous appearing fluid on 06/26/2020.  Biopsy results pending.  Hospital Course:  1. High-grade follicular B-cell lymphoma--PET/CT scan showed extensive intra-abdominal lymphadenopathy, peritoneal carcinomatosis.  Patient underwent right inguinal lymph node biopsy as well as  paracentesis yielding chylous fluid.  Peritoneal fluid analysis shows non-Hodgkin's B-cell lymphoma.  Right inguinal tissue biopsy specimen was insufficient for analysis.  S/p Port-A-Cath placement.  Continue high-dose prednisone 80 mg daily.  Oncology has started palliative chemotherapy with R-CHOP.    Patient received first cycle of chemotherapy on 07/01/2020.  Patient to be discharged today and follow-up with oncology as outpatient on 07/03/2020.  He is planned to receive Rituxan and  Udenyca as outpatient. 2. Generalized lymphadenopathy- secondary to  lymphoma as above.  Hepatitis B and C were negative.  HIV was negative. 3. Peripheral edema-likely from anasarca , albumin is 3.2.  Also could be from widespread lymphadenopathy. 4. Dyspnea-improved, chest x-ray showed mild to moderate pleural effusion.  He underwent paracentesis which improved dyspnea.    Echocardiogram showed EF 60 to 70% with grade 1 diastolic dysfunction.  Patient did receive 1 dose of Lasix.  He is currently not requiring oxygen. 5. ? Tumor lysis syndrome-uric acid level elevated at 13.1.  Patient started on allopurinol.  Uric acid has come down to 10.3.*Continue allopurinol  Procedures:  Lymph node biopsy  Paracentesis  Consultations:  Oncology  Discharge Exam: Vitals:   07/01/20 2023 07/02/20 0536  BP: 120/77 108/71  Pulse: (!) 101 87  Resp: 16 16  Temp: (!) 97.4 F (36.3 C) 98.2 F (36.8 C)  SpO2: 92% 93%    General: Appears in no acute distress Cardiovascular: S1-S2, regular, no murmur auscultated Respiratory: Clear to auscultation bilaterally  Discharge Instructions   Discharge Instructions    Diet - low sodium heart healthy   Complete by: As directed    Increase activity slowly   Complete by: As directed    No wound care  Complete by: As directed      Allergies as of 07/02/2020      Reactions   Omeprazole Nausea And Vomiting      Medication List    TAKE these medications   allopurinol  100 MG tablet Commonly known as: ZYLOPRIM Take 1 tablet (100 mg total) by mouth 2 (two) times daily.   lidocaine-prilocaine cream Commonly known as: EMLA Apply to affected area once   LORazepam 0.5 MG tablet Commonly known as: Ativan Take 1 tablet (0.5 mg total) by mouth every 6 (six) hours as needed (Nausea or vomiting).   multivitamin capsule Take 1 capsule by mouth daily.   ondansetron 8 MG tablet Commonly known as: Zofran Take 1 tablet (8 mg total) by mouth every 8 (eight) hours as needed for vomiting or refractory nausea / vomiting. Start on day 3 after cyclophosphamide chemotherapy.   oxyCODONE 5 MG immediate release tablet Commonly known as: Oxy IR/ROXICODONE Take 1 tablet (5 mg total) by mouth every 4 (four) hours as needed for severe pain.   pantoprazole 40 MG tablet Commonly known as: PROTONIX Take 40 mg by mouth 2 (two) times daily as needed (indigestion).   polyethylene glycol 17 g packet Commonly known as: MiraLax Take 17 g by mouth daily.   predniSONE 20 MG tablet Commonly known as: DELTASONE Take 3 tablets (60 mg total) by mouth daily. Take with food   prochlorperazine 10 MG tablet Commonly known as: COMPAZINE Take 1 tablet (10 mg total) by mouth every 6 (six) hours as needed (Nausea or vomiting).   senna-docusate 8.6-50 MG tablet Commonly known as: Senna S Take 2 tablets by mouth at bedtime.   testosterone cypionate 200 MG/ML injection Commonly known as: DEPOTESTOSTERONE CYPIONATE Inject into the muscle once a week.   VITAMIN B COMPLEX PO Take 1 tablet by mouth daily.   vitamin C 1000 MG tablet Take 1,000 mg by mouth daily.      Allergies  Allergen Reactions  . Omeprazole Nausea And Vomiting      The results of significant diagnostics from this hospitalization (including imaging, microbiology, ancillary and laboratory) are listed below for reference.    Significant Diagnostic Studies: CT Angio Chest PE W and/or Wo Contrast  Result  Date: 06/24/2020 CLINICAL DATA:  64 year old male with recent diagnosis of lymphoma presenting with shortness of breath. Concern for pulmonary embolism. EXAM: CT ANGIOGRAPHY CHEST CT ABDOMEN AND PELVIS WITH CONTRAST TECHNIQUE: Multidetector CT imaging of the chest was performed using the standard protocol during bolus administration of intravenous contrast. Multiplanar CT image reconstructions and MIPs were obtained to evaluate the vascular anatomy. Multidetector CT imaging of the abdomen and pelvis was performed using the standard protocol during bolus administration of intravenous contrast. CONTRAST:  164mL OMNIPAQUE IOHEXOL 350 MG/ML SOLN COMPARISON:  Chest radiograph dated 06/24/2020. FINDINGS: Evaluation of this exam is limited due to respiratory motion artifact. CTA CHEST FINDINGS Cardiovascular: There is no cardiomegaly or pericardial effusion. The thoracic aorta is unremarkable. Evaluation of the pulmonary arteries is limited due to respiratory motion artifact and suboptimal opacification and timing of the contrast. No large or central pulmonary artery embolus identified. Mediastinum/Nodes: There is no hilar adenopathy. Enlarged lymph nodes noted anterior to the heart and in the anterior cardiophrenic space. The esophagus and the thyroid gland are grossly unremarkable. No mediastinal fluid collection. Lungs/Pleura: Small to moderate bilateral pleural effusions with associated partial compressive atelectasis the lower lobes. Pneumonia is not excluded clinical correlation is recommended. There is no pneumothorax. The central  airways are patent. Musculoskeletal: No chest wall abnormality. No acute or significant osseous findings. Review of the MIP images confirms the above findings. CT ABDOMEN and PELVIS FINDINGS No intra-abdominal free air. There is moderate ascites. Hepatobiliary: The liver is unremarkable. No intrahepatic biliary ductal dilatation. The gallbladder is unremarkable. Pancreas: The pancreas is  suboptimally visualized but grossly unremarkable. Spleen: Normal in size without focal abnormality. Adrenals/Urinary Tract: The adrenal glands unremarkable. There is mild left hydronephrosis. There is delayed excretion of contrast by left kidney. The right kidney is unremarkable. The urinary bladder is grossly unremarkable. Stomach/Bowel: There is moderate stool throughout the colon. There is no bowel obstruction. The appendix is not visualized with certainty. No inflammatory changes identified in the right lower quadrant. Vascular/Lymphatic: Mild aortoiliac atherosclerotic disease. The IVC is grossly unremarkable. No portal venous gas. Extensive retroperitoneal adenopathy encasing the abdominal aorta and iliac arteries. There is diffuse mesenteric and omental implants and caking. Large soft tissue mass in the mesentery encasing the SMA measures approximately 11 x 17 cm in axial dimensions and 15 cm in craniocaudal length. Reproductive: The prostate is grossly unremarkable. Other: Diffuse subcutaneous edema. Small fat containing umbilical hernia. Musculoskeletal: No acute or significant osseous findings. Review of the MIP images confirms the above findings. IMPRESSION: 1. No CT evidence of central pulmonary artery embolus. 2. Small to moderate bilateral pleural effusions with associated partial compressive atelectasis of the lower lobes. Pneumonia is not excluded clinical correlation is recommended. 3. Extensive retroperitoneal, mesenteric, and omental adenopathy in keeping with history of lymphoma. Enlarged lymph nodes in the anterior cardiophrenic space. 4. Moderate ascites. 5. Mild left hydronephrosis. 6. Aortic Atherosclerosis (ICD10-I70.0). Electronically Signed   By: Anner Crete M.D.   On: 06/24/2020 16:02   CT ABDOMEN PELVIS W CONTRAST  Result Date: 06/24/2020 CLINICAL DATA:  64 year old male with recent diagnosis of lymphoma presenting with shortness of breath. Concern for pulmonary embolism. EXAM:  CT ANGIOGRAPHY CHEST CT ABDOMEN AND PELVIS WITH CONTRAST TECHNIQUE: Multidetector CT imaging of the chest was performed using the standard protocol during bolus administration of intravenous contrast. Multiplanar CT image reconstructions and MIPs were obtained to evaluate the vascular anatomy. Multidetector CT imaging of the abdomen and pelvis was performed using the standard protocol during bolus administration of intravenous contrast. CONTRAST:  179mL OMNIPAQUE IOHEXOL 350 MG/ML SOLN COMPARISON:  Chest radiograph dated 06/24/2020. FINDINGS: Evaluation of this exam is limited due to respiratory motion artifact. CTA CHEST FINDINGS Cardiovascular: There is no cardiomegaly or pericardial effusion. The thoracic aorta is unremarkable. Evaluation of the pulmonary arteries is limited due to respiratory motion artifact and suboptimal opacification and timing of the contrast. No large or central pulmonary artery embolus identified. Mediastinum/Nodes: There is no hilar adenopathy. Enlarged lymph nodes noted anterior to the heart and in the anterior cardiophrenic space. The esophagus and the thyroid gland are grossly unremarkable. No mediastinal fluid collection. Lungs/Pleura: Small to moderate bilateral pleural effusions with associated partial compressive atelectasis the lower lobes. Pneumonia is not excluded clinical correlation is recommended. There is no pneumothorax. The central airways are patent. Musculoskeletal: No chest wall abnormality. No acute or significant osseous findings. Review of the MIP images confirms the above findings. CT ABDOMEN and PELVIS FINDINGS No intra-abdominal free air. There is moderate ascites. Hepatobiliary: The liver is unremarkable. No intrahepatic biliary ductal dilatation. The gallbladder is unremarkable. Pancreas: The pancreas is suboptimally visualized but grossly unremarkable. Spleen: Normal in size without focal abnormality. Adrenals/Urinary Tract: The adrenal glands unremarkable.  There is mild left hydronephrosis. There  is delayed excretion of contrast by left kidney. The right kidney is unremarkable. The urinary bladder is grossly unremarkable. Stomach/Bowel: There is moderate stool throughout the colon. There is no bowel obstruction. The appendix is not visualized with certainty. No inflammatory changes identified in the right lower quadrant. Vascular/Lymphatic: Mild aortoiliac atherosclerotic disease. The IVC is grossly unremarkable. No portal venous gas. Extensive retroperitoneal adenopathy encasing the abdominal aorta and iliac arteries. There is diffuse mesenteric and omental implants and caking. Large soft tissue mass in the mesentery encasing the SMA measures approximately 11 x 17 cm in axial dimensions and 15 cm in craniocaudal length. Reproductive: The prostate is grossly unremarkable. Other: Diffuse subcutaneous edema. Small fat containing umbilical hernia. Musculoskeletal: No acute or significant osseous findings. Review of the MIP images confirms the above findings. IMPRESSION: 1. No CT evidence of central pulmonary artery embolus. 2. Small to moderate bilateral pleural effusions with associated partial compressive atelectasis of the lower lobes. Pneumonia is not excluded clinical correlation is recommended. 3. Extensive retroperitoneal, mesenteric, and omental adenopathy in keeping with history of lymphoma. Enlarged lymph nodes in the anterior cardiophrenic space. 4. Moderate ascites. 5. Mild left hydronephrosis. 6. Aortic Atherosclerosis (ICD10-I70.0). Electronically Signed   By: Anner Crete M.D.   On: 06/24/2020 16:02   NM PET Image Initial (PI) Skull Base To Thigh  Result Date: 06/21/2020 CLINICAL DATA:  Initial treatment strategy for non-Hodgkin lymphoma. EXAM: NUCLEAR MEDICINE PET SKULL BASE TO THIGH TECHNIQUE: 9.5 mCi F-18 FDG was injected intravenously. Full-ring PET imaging was performed from the skull base to thigh after the radiotracer. CT data was obtained  and used for attenuation correction and anatomic localization. Fasting blood glucose: 90 mg/dl COMPARISON:  CT abdomen pelvis 06/05/2020, coronary CT 01/28/2020 and CT chest 11/17/2017. FINDINGS: Mediastinal blood pool activity: SUV max 0.8 Liver activity: SUV max 3.2 NECK: Mild hypermetabolic lymph nodes in the neck are seen with an index left level 2 lymph node measuring approximately 6 mm (4/27) and fall SUV max of 4.0. Incidental CT findings: None. CHEST: Hypermetabolic low internal jugular mediastinal, internal mammary and axillary lymph nodes. Index low left paratracheal lymph measures 8 mm (4/64) with an SUV max of 6.8. Clustered prepericardiac lymph nodes measure up to 12 mm (4/90) with an SUV max of 10.0. No hypermetabolic pulmonary nodules. Incidental CT findings: Coronary artery calcification. Heart is mildly enlarged. No pericardial effusion. Moderate bilateral pleural effusions with compressive atelectasis in both lower lobes. ABDOMEN/PELVIS: Hypermetabolic adenopathy is seen throughout the abdominal peritoneal ligaments, retroperitoneum and mesenteries. Conglomerate nodal mesenteric mass is difficult to differentiate from small bowel, which may be involved, with an SUV max of 14.4, measuring approximately 8.6 x 12.6 cm (4/137). Index gastrohepatic ligament lymph node measures approximately 2.3 cm (4/110) with an SUV max of 11.4. Extensive hypermetabolic peritoneal/omental thickening/caking, with an index 1.9 x 3.6 cm left omental nodule (4/121), SUV max 11.8. Inguinal lymph nodes measure up to 1.5 cm on the left (4/202) with an SUV max of 1.7. No abnormal hypermetabolism in the liver, adrenal glands, spleen or pancreas. Incidental CT findings: Visualized portion of the liver is grossly unremarkable. There may be sludge in the gallbladder. Adrenal glands and right kidney are grossly unremarkable. There may be a punctate stone in the lower pole left kidney. Left pelvicaliceal fullness versus mild  hydronephrosis. Spleen is grossly unremarkable. Bladder appears thick-walled but is under distended. Ascites. SKELETON: No abnormal osseous hypermetabolism. Incidental CT findings: Degenerative changes in the spine. IMPRESSION: 1. Hypermetabolic adenopathy throughout the neck,  chest, abdomen and pelvis. Findings are worst in the abdomen, with extensive peritoneal carcinomatosis. Small bowel involvement is not excluded. Findings are consistent with the provided history of lymphoma, [place] 5. 2. Moderate bilateral pleural effusions with compressive atelectasis in both lower lobes. 3. Left pelvicaliceal fullness versus mild hydronephrosis. 4. Question punctate left renal stone. Electronically Signed   By: Lorin Picket M.D.   On: 06/21/2020 09:04   US Abdomen Limited  Result Date: 06/29/2020 CLINICAL DATA:  Question ascites. EXAM: LIMITED ABDOMEN ULTRASOUND FOR ASCITES TECHNIQUE: Limited ultrasound survey for ascites was performed in all four abdominal quadrants. COMPARISON:  CT 06/24/2020 FINDINGS: Four-quadrant scanning shows a small amount of ascites without any accessible collection. This appears considerably diminished in comparison to the CT of 5 days ago. IMPRESSION: Small amount of ascites. Amount appears considerably reduced compared with a CT scan of 5 days ago. Electronically Signed   By: Nelson Chimes M.D.   On: 06/29/2020 15:24   IR US Guide Bx Asp/Drain  Result Date: 06/26/2020 INDICATION: Concern for lymphoma. Please perform ultrasound-guided biopsy of hypermetabolic inguinal lymph node for tissue diagnostic purposes Additionally, patient currently admitted with abdominal pain and distension and as such request made for ascites search ultrasound ultrasound-guided paracentesis for diagnostic and therapeutic purposes as indicated. EXAM: 1. ULTRASOUND-GUIDED RIGHT INGUINAL LYMPH NODE BIOPSY 2. ULTRASOUND-GUIDED PARACENTESIS COMPARISON:  CT of the chest, abdomen and pelvis- 06/24/2020;  PET-CT-06/21/2020 MEDICATIONS: None. COMPLICATIONS: None immediate. TECHNIQUE: Informed written consent was obtained from the patient after a discussion of the risks, benefits and alternatives to treatment. A timeout was performed prior to the initiation of the procedure. Initial ultrasound scanning demonstrates a small amount of intra-abdominal ascites with dominant pocket located within right upper abdomen adjacent to the hepatic parenchyma. As such, the skin overlying the right upper abdomen was subsequently prepped and draped in the usual sterile fashion. 1% lidocaine with epinephrine was used for local anesthesia. Under direct ultrasound guidance, an 8 Fr Safe-T-Centesis catheter was introduced. The paracentesis was performed. A small amount of aspirated fluid was capped and sent to the laboratory for cytologic analysis as well as triglyceride levels. The catheter was removed and a dressing was applied. Next, attention was paid towards ultrasound-guided biopsy of known hypermetabolic right inguinal lymphadenopathy. Sonographic evaluation of the right groin demonstrates an at least 2.4 x 0.8 cm right inguinal lymph node correlating with the dominant right inguinal lymph node seen on preceding abdominal CT image 90, series 2. The procedure was planned. The right groin was prepped and draped in usual sterile fashion. After the overlying soft tissues anesthetized 1% lidocaine with epinephrine, an 18 gauge core needle biopsy device was utilized to obtain 6 core needle biopsy samples. Multiple ultrasound images were saved procedural documentation purposes. Samples were placed in saline and submitted to the laboratory for pathologic analysis. The patient tolerated the above procedures well without immediate postprocedural complication. FINDINGS: A total of approximately 2 liters of chylous fluid was removed. Samples were sent to the laboratory as requested by the clinical team. Successful ultrasound-guided biopsy  dominant right inguinal lymph node IMPRESSION: 1. Successful ultrasound-guided biopsy of dominant right inguinal node. 2. Successful ultrasound-guided paracentesis yielding 2 liters of chylous appearing fluid. Fluid was sent to the laboratory for cytologic analysis as well as triglyceride levels. Electronically Signed   By: Sandi Mariscal M.D.   On: 06/26/2020 13:16   DG Chest Port 1 View  Result Date: 06/24/2020 CLINICAL DATA:  Abdominal pain and swelling.  History of lymphoma.  EXAM: PORTABLE CHEST 1 VIEW COMPARISON:  Chest radiograph 11/17/2017 FINDINGS: Normal cardiac and mediastinal contours. Small bilateral pleural effusions with underlying consolidation. No pleural effusion or pneumothorax. Osseous structures unremarkable. IMPRESSION: Small bilateral pleural effusions with underlying consolidation. Electronically Signed   By: Lovey Newcomer M.D.   On: 06/24/2020 14:11   ECHOCARDIOGRAM COMPLETE  Result Date: 06/25/2020    ECHOCARDIOGRAM REPORT   Patient Name:   George Proctor Date of Exam: 06/25/2020 Medical Rec #:  [id number]      Height:       73.0 in Accession #:    [id number]     Weight:       189.0 lb Date of Birth:  1955-12-03      BSA:          2.101 m Patient Age:    53 years       BP:           136/92 mmHg Patient Gender: M              HR:           120 bpm. Exam Location:  Inpatient Procedure: 2D Echo Indications:    786.09 dyspnea  History:        Patient has prior history of Echocardiogram examinations, most                 recent 08/11/2019. Lymphoma.  Sonographer:    Jannett Celestine RDCS (AE) Referring Phys: [phone] [facility] [place]  Sonographer Comments: Suboptimal parasternal window and suboptimal subcostal window. IMPRESSIONS  1. Left ventricular ejection fraction, by estimation, is 65 to 70%. The left ventricle has normal function. The left ventricle has no regional wall motion abnormalities. Left ventricular diastolic parameters are consistent with Grade I diastolic dysfunction (impaired  relaxation).  2. Right ventricular systolic function is hyperdynamic. The right ventricular size is normal.  3. The mitral valve is normal in structure. No evidence of mitral valve regurgitation. No evidence of mitral stenosis.  4. The aortic valve is normal in structure. Aortic valve regurgitation is not visualized. No aortic stenosis is present. FINDINGS  Left Ventricle: Left ventricular ejection fraction, by estimation, is 65 to 70%. The left ventricle has normal function. The left ventricle has no regional wall motion abnormalities. The left ventricular internal cavity size was normal in size. There is  no left ventricular hypertrophy. Left ventricular diastolic parameters are consistent with Grade I diastolic dysfunction (impaired relaxation). Right Ventricle: The right ventricular size is normal. No increase in right ventricular wall thickness. Right ventricular systolic function is hyperdynamic. Left Atrium: Left atrial size was normal in size. Right Atrium: Right atrial size was normal in size. Pericardium: There is no evidence of pericardial effusion. Mitral Valve: The mitral valve is normal in structure. No evidence of mitral valve regurgitation. No evidence of mitral valve stenosis. Tricuspid Valve: The tricuspid valve is normal in structure. Tricuspid valve regurgitation is trivial. No evidence of tricuspid stenosis. Aortic Valve: The aortic valve is normal in structure. Aortic valve regurgitation is not visualized. No aortic stenosis is present. Pulmonic Valve: The pulmonic valve was normal in structure. Pulmonic valve regurgitation is not visualized. No evidence of pulmonic stenosis. Aorta: The aortic root is normal in size and structure. Venous: The inferior vena cava was not well visualized. IAS/Shunts: No atrial level shunt detected by color flow Doppler.  LEFT VENTRICLE PLAX 2D LVOT diam:     2.50 cm LV SV:  72 LV SV Index:   34 LVOT Area:     4.91 cm  RIGHT VENTRICLE RV S prime:     18.00  cm/s TAPSE (M-mode): 1.8 cm LEFT ATRIUM             Index       RIGHT ATRIUM           Index LA Vol (A2C):   60.4 ml 28.75 ml/m RA Area:     15.80 cm LA Vol (A4C):   42.5 ml 20.23 ml/m RA Volume:   32.40 ml  15.42 ml/m LA Biplane Vol: 54.0 ml 25.71 ml/m  AORTIC VALVE LVOT Vmax:   95.30 cm/s LVOT Vmean:  74.500 cm/s LVOT VTI:    0.146 m MITRAL VALVE               TRICUSPID VALVE MV Area (PHT): 3.31 cm    TR Peak grad:   13.7 mmHg MV Decel Time: 229 msec    TR Vmax:        185.00 cm/s MV E velocity: 69.40 cm/s MV A velocity: 87.00 cm/s  SHUNTS MV E/A ratio:  0.80        Systemic VTI:  0.15 m                            Systemic Diam: 2.50 cm Skeet Latch MD Electronically signed by Skeet Latch MD Signature Date/Time: 06/25/2020/4:06:39 PM    Final    IR IMAGING GUIDED PORT INSERTION  Result Date: 06/30/2020 CLINICAL DATA:  High-grade follicular lymphoma and need for porta cath to begin chemotherapy. EXAM: IMPLANTED PORT A CATH PLACEMENT WITH ULTRASOUND AND FLUOROSCOPIC GUIDANCE ANESTHESIA/SEDATION: 4.0 mg IV Versed; 100 mcg IV Fentanyl Total Moderate Sedation Time:  32 minutes The patient's level of consciousness and physiologic status were continuously monitored during the procedure by Radiology nursing. Additional Medications: 2 g IV Ancef. FLUOROSCOPY TIME:  24 seconds.  9.0 mGy. PROCEDURE: The procedure, risks, benefits, and alternatives were explained to the patient. Questions regarding the procedure were encouraged and answered. The patient understands and consents to the procedure. A time-out was performed prior to initiating the procedure. Ultrasound was utilized to confirm patency of the right internal jugular vein. The right neck and chest were prepped with chlorhexidine in a sterile fashion, and a sterile drape was applied covering the operative field. Maximum barrier sterile technique with sterile gowns and gloves were used for the procedure. Local anesthesia was provided with 1%  lidocaine. After creating a small venotomy incision, a 21 gauge needle was advanced into the right internal jugular vein under direct, real-time ultrasound guidance. Ultrasound image documentation was performed. After securing guidewire access, an 8 Fr dilator was placed. A J-wire was kinked to measure appropriate catheter length. A subcutaneous port pocket was then created along the upper chest wall utilizing sharp and blunt dissection. Portable cautery was utilized. The pocket was irrigated with sterile saline. A single lumen power injectable port was chosen for placement. The 8 Fr catheter was tunneled from the port pocket site to the venotomy incision. The port was placed in the pocket. External catheter was trimmed to appropriate length based on guidewire measurement. At the venotomy, an 8 Fr peel-away sheath was placed over a guidewire. The catheter was then placed through the sheath and the sheath removed. Final catheter positioning was confirmed and documented with a fluoroscopic spot image. The port was accessed with a needle and aspirated  and flushed with heparinized saline. The access needle was removed. The venotomy and port pocket incisions were closed with subcutaneous 3-0 Monocryl and subcuticular 4-0 Vicryl. Dermabond was applied to both incisions. COMPLICATIONS: COMPLICATIONS None FINDINGS: After catheter placement, the tip lies at the cavo-atrial junction. The catheter aspirates normally and is ready for immediate use. IMPRESSION: Placement of single lumen port a cath via right internal jugular vein. The catheter tip lies at the cavo-atrial junction. A power injectable port a cath was placed and is ready for immediate use. Electronically Signed   By: Aletta Edouard M.D.   On: 06/30/2020 16:58   VAS [place] LOWER EXTREMITY VENOUS (DVT)  Result Date: 06/29/2020  Lower Venous DVTStudy Indications: Edema.  Risk Factors: Underlying diagnosis of Non-Hodgkin's lymphoma. Limitations: Poor  ultrasound/tissue interface. Performing Technologist: Maudry Mayhew MHA, RDMS, RVT, RDCS  Examination Guidelines: A complete evaluation includes B-mode imaging, spectral Doppler, color Doppler, and power Doppler as needed of all accessible portions of each vessel. Bilateral testing is considered an integral part of a complete examination. Limited examinations for reoccurring indications may be performed as noted. The reflux portion of the exam is performed with the patient in reverse Trendelenburg.  +---------+---------------+---------+-----------+----------+--------------+ RIGHT    CompressibilityPhasicitySpontaneityPropertiesThrombus Aging +---------+---------------+---------+-----------+----------+--------------+ CFV      Full           Yes      Yes                                 +---------+---------------+---------+-----------+----------+--------------+ SFJ      Full                                                        +---------+---------------+---------+-----------+----------+--------------+ FV Prox  Full                                                        +---------+---------------+---------+-----------+----------+--------------+ FV Mid   Full                                                        +---------+---------------+---------+-----------+----------+--------------+ FV DistalFull                                                        +---------+---------------+---------+-----------+----------+--------------+ PFV      Full                                                        +---------+---------------+---------+-----------+----------+--------------+ POP      Full           Yes      Yes                                 +---------+---------------+---------+-----------+----------+--------------+  PTV      Full                                                         +---------+---------------+---------+-----------+----------+--------------+ PERO     Full                                                        +---------+---------------+---------+-----------+----------+--------------+   +----+---------------+---------+-----------+----------+--------------+ LEFTCompressibilityPhasicitySpontaneityPropertiesThrombus Aging +----+---------------+---------+-----------+----------+--------------+ CFV Full           Yes      Yes                                 +----+---------------+---------+-----------+----------+--------------+     Summary: RIGHT: - There is no evidence of deep vein thrombosis in the lower extremity.  - No cystic structure found in the popliteal fossa. - Ultrasound characteristics of enlarged lymph nodes are noted in the groin.  LEFT: - No evidence of common femoral vein obstruction. - Ultrasound characteristics of enlarged lymph nodes noted in the groin.  *See table(s) above for measurements and observations. Electronically signed by Servando Snare MD on 06/29/2020 at 5:37:15 PM.    Final    IR Paracentesis  Result Date: 06/26/2020 INDICATION: Concern for lymphoma. Please perform ultrasound-guided biopsy of hypermetabolic inguinal lymph node for tissue diagnostic purposes Additionally, patient currently admitted with abdominal pain and distension and as such request made for ascites search ultrasound ultrasound-guided paracentesis for diagnostic and therapeutic purposes as indicated. EXAM: 1. ULTRASOUND-GUIDED RIGHT INGUINAL LYMPH NODE BIOPSY 2. ULTRASOUND-GUIDED PARACENTESIS COMPARISON:  CT of the chest, abdomen and pelvis- 06/24/2020; PET-CT-06/21/2020 MEDICATIONS: None. COMPLICATIONS: None immediate. TECHNIQUE: Informed written consent was obtained from the patient after a discussion of the risks, benefits and alternatives to treatment. A timeout was performed prior to the initiation of the procedure. Initial ultrasound scanning demonstrates  a small amount of intra-abdominal ascites with dominant pocket located within right upper abdomen adjacent to the hepatic parenchyma. As such, the skin overlying the right upper abdomen was subsequently prepped and draped in the usual sterile fashion. 1% lidocaine with epinephrine was used for local anesthesia. Under direct ultrasound guidance, an 8 Fr Safe-T-Centesis catheter was introduced. The paracentesis was performed. A small amount of aspirated fluid was capped and sent to the laboratory for cytologic analysis as well as triglyceride levels. The catheter was removed and a dressing was applied. Next, attention was paid towards ultrasound-guided biopsy of known hypermetabolic right inguinal lymphadenopathy. Sonographic evaluation of the right groin demonstrates an at least 2.4 x 0.8 cm right inguinal lymph node correlating with the dominant right inguinal lymph node seen on preceding abdominal CT image 90, series 2. The procedure was planned. The right groin was prepped and draped in usual sterile fashion. After the overlying soft tissues anesthetized 1% lidocaine with epinephrine, an 18 gauge core needle biopsy device was utilized to obtain 6 core needle biopsy samples. Multiple ultrasound images were saved procedural documentation purposes. Samples were placed in saline and submitted to the laboratory for pathologic analysis. The patient tolerated the above procedures well without immediate postprocedural complication. FINDINGS: A  total of approximately 2 liters of chylous fluid was removed. Samples were sent to the laboratory as requested by the clinical team. Successful ultrasound-guided biopsy dominant right inguinal lymph node IMPRESSION: 1. Successful ultrasound-guided biopsy of dominant right inguinal node. 2. Successful ultrasound-guided paracentesis yielding 2 liters of chylous appearing fluid. Fluid was sent to the laboratory for cytologic analysis as well as triglyceride levels. Electronically  Signed   By: Sandi Mariscal M.D.   On: 06/26/2020 13:16    Microbiology: Recent Results (from the past 240 hour(s))  Blood culture (routine single)     Status: None   Collection Time: 06/24/20  1:45 PM   Specimen: BLOOD  Result Value Ref Range Status   Specimen Description   Final    BLOOD Performed at [facility] [address], [place], [place] [address]    Special Requests   Final    NONE Performed at [facility], [place] [address], [place], [place] [address]    Culture   Final    NO GROWTH 5 DAYS Performed at [facility], [place] [address] [place], [place] [address]    Report Status 06/29/2020 FINAL  Final  Respiratory Panel by RT PCR (Flu A&B, Covid) - Nasopharyngeal Swab     Status: None   Collection Time: 06/24/20  1:45 PM   Specimen: Nasopharyngeal Swab  Result Value Ref Range Status   SARS Coronavirus 2 by RT PCR NEGATIVE NEGATIVE Final    Comment: (NOTE) SARS-CoV-2 target nucleic acids are NOT DETECTED.  The SARS-CoV-2 RNA is generally detectable in upper respiratoy specimens during the acute phase of infection. The lowest concentration of SARS-CoV-2 viral copies this assay can detect is 131 copies/mL. A negative result does not preclude SARS-Cov-2 infection and should not be used as the sole basis for treatment or other patient management decisions. A negative result may occur with  improper specimen collection/handling, submission of specimen other than nasopharyngeal swab, presence of viral mutation(s) within the areas targeted by this assay, and inadequate number of viral copies (<131 copies/mL). A negative result must be combined with clinical observations, patient history, and epidemiological information. The expected result is Negative.  Fact Sheet for Patients:  PinkCheek.be  Fact Sheet for Healthcare Providers:  GravelBags.it  This test is no t yet  approved or cleared by the [place] FDA and  has been authorized for detection and/or diagnosis of SARS-CoV-2 by FDA under an Emergency Use Authorization (EUA). This EUA will remain  in effect (meaning this test can be used) for the duration of the COVID-19 declaration under Section 564(b)(1) of the Act, 21 U.S.C. section 360bbb-3(b)(1), unless the authorization is terminated or revoked sooner.     Influenza A by PCR NEGATIVE NEGATIVE Final   Influenza B by PCR NEGATIVE NEGATIVE Final    Comment: (NOTE) The Xpert Xpress SARS-CoV-2/FLU/RSV assay is intended as an aid in  the diagnosis of influenza from Nasopharyngeal swab specimens and  should not be used as a sole basis for treatment. Nasal washings and  aspirates are unacceptable for Xpert Xpress SARS-CoV-2/FLU/RSV  testing.  Fact Sheet for Patients: PinkCheek.be  Fact Sheet for Healthcare Providers: GravelBags.it  This test is not yet approved or cleared by the [place] FDA and  has been authorized for detection and/or diagnosis of SARS-CoV-2 by  FDA under an Emergency Use Authorization (EUA). This EUA will remain  in effect (meaning this test can be used) for the duration of the  Covid-19  declaration under Section 564(b)(1) of the Act, 21  U.S.C. section 360bbb-3(b)(1), unless the authorization is  terminated or revoked. Performed at [facility], [place] [address] [place], [place] [address]   Urine culture     Status: None   Collection Time: 06/24/20  5:28 PM   Specimen: In/Out Cath Urine  Result Value Ref Range Status   Specimen Description   Final    IN/OUT CATH URINE Performed at [facility] [address], [place], [place] [address]    Special Requests   Final    NONE Performed at [facility], [place] [address] [place], [place] [address]    Culture   Final    NO GROWTH Performed at [facility], [place] [address], [place], [place] [address]    Report Status 06/26/2020 FINAL  Final  Body fluid culture     Status: None   Collection Time: 06/26/20 11:40 AM   Specimen: PATH Cytology Peritoneal fluid  Result Value Ref Range Status   Specimen Description   Final    PERITONEAL Performed at [facility] [address] [place], [place] [address]    Special Requests   Final    NONE Performed at [facility], [place] [address] [place], [place] [address]    Gram Stain   Final    RARE WBC PRESENT, PREDOMINANTLY MONONUCLEAR NO ORGANISMS SEEN    Culture   Final    NO GROWTH 3 DAYS Performed at [facility], [place] [address], [place], [place] [address]    Report Status 06/29/2020 FINAL  Final     Labs: Basic Metabolic Panel: Recent Labs  Lab 06/26/20 0326 06/26/20 0326 06/27/20 0742 06/28/20 0311 06/29/20 0320 07/01/20 0843 07/02/20 0500  NA 134*   < > 135 135 139 138 135  K 4.0   < > 4.6 4.4 4.3 4.2 4.2  CL 96*   < > 98 100 102 101 101  CO2 25   < > 26 26 27 25 26   GLUCOSE 93   < > 124* 116* 99 96 102*  BUN 13   < > 23 23 19 22  28*  CREATININE 1.17   < > 1.17 1.10 0.90 0.96 0.74  CALCIUM 8.9   < > 8.9 8.5* 8.5* 9.0 8.3*  MG 1.9  --   --  2.3  --   --   --    < > = values in this interval not displayed.   Liver Function Tests: Recent Labs  Lab 06/27/20 0742 06/29/20 0320 07/01/20 0843 07/02/20 0500  AST 70* 75* 65* 54*  ALT 25 32 26 20  ALKPHOS 105 86 93 75  BILITOT 0.4 0.4 0.7 0.7  PROT 5.7* 5.1* 5.7* 5.0*  ALBUMIN 3.2* 3.1* 3.2* 2.9*   No results for input(s): LIPASE, AMYLASE in the last 168 hours. No results for input(s): AMMONIA in the last 168 hours. CBC: Recent Labs  Lab 06/26/20 0326 06/27/20 0742 06/28/20 0311 07/01/20 0843 07/02/20 0500  WBC 5.1 4.9 5.3 6.9 7.7  NEUTROABS  --  4.3  --  6.0  --   HGB 14.1 13.4 12.3* 14.4 12.6*  HCT 43.9 41.1 36.9* 44.6 37.9*  MCV 87.6 85.4 84.8 86.1 85.6   PLT 297 311 297 361 304   Cardiac Enzymes: No results for input(s): CKTOTAL, CKMB, CKMBINDEX, TROPONINI in the last 168 hours. BNP: BNP (last 3 results) Recent Labs  06/25/20 0300  BNP 29.7       Signed:  Oswald Hillock MD.  [facility] 07/02/2020, 12:40 PM

## 2020-07-02 NOTE — Progress Notes (Signed)
Marland Kitchen   HEMATOLOGY/ONCOLOGY INPATIENT PROGRESS NOTE  Date of Service: 07/02/2020  Inpatient Attending: .Oswald Hillock, MD  SUBJECTIVE  HPI: George Proctor was admitted on 06/24/2020 with abdominal pain abdominal distention and shortness of breath and was noted to have some mild hypoxia.  Work-up ruled out a PE and acute coronary artery disease.  Echocardiogram has shown normal ejection fraction. He had a PET scan as outpatient on 06/21/2020 as a part of his initial work-up for suspected lymphoma which shows hypermetabolic adenopathy throughout the neck chest abdomen and pelvis with most prominent findings in the abdomen with extensive peritoneal carcinomatosis.  He also has developed some moderate bilateral pleural effusions. The patient underwent ultrasound-guided biopsy of the right inguinal lymph node as well as an ultrasound-guided paracentesis yielding 2 L of chylous appearing fluid on 06/26/2020.  He had improvement of his breathing abdominal distention following the procedure.  He was started on high-dose prednisone at 80 mg daily following the biopsy.  He was also started on allopurinol due to an elevated uric acid level and concern for tumor lysis syndrome. A port was placed and chemo was planned to start on 07/01/2020.   Interval History: --today is [facility] Day 2. Rituximab to be given outpatient (10/18)  -- denies fevers, chills, sweats, nausea or vomiting -- 2-3 BM today after stool softener/miralax, appetite remains good -- no additional questions, concerns, or complaints today --patient OK with d/c and close follow up with infusion/injection Monday  OBJECTIVE:  NAD  PHYSICAL EXAMINATION: . Vitals:   07/01/20 0545 07/01/20 1359 07/01/20 2023 07/02/20 0536  BP: 132/87 (!) 142/92 120/77 108/71  Pulse: (!) 110 (!) 102 (!) 101 87  Resp: 16 18 16 16   Temp: 98 F (36.7 C) 97.6 F (36.4 C) (!) 97.4 F (36.3 C) 98.2 F (36.8 C)  TempSrc: Oral Oral Oral Oral  SpO2: 92% 93% 92% 93%    Weight:      Height:       Filed Weights   06/24/20 1226  Weight: 189 lb (85.7 kg)   .Body mass index is 24.94 kg/m.  GENERAL:alert, in no acute distress and comfortable SKIN: skin color, texture, turgor are normal, no rashes or significant lesions EYES: normal, conjunctiva are pink and non-injected, sclera clear LUNGS: clear to auscultation with normal respiratory effort HEART: regular rate & rhythm,  no murmurs and no lower extremity edema ABDOMEN: abdomen distended with inverted umbilicus.  Mild tenderness to palpation over central abdomen. Musculoskeletal: 2+ bilateral lower extremity edema, right greater than left PSYCH: alert & oriented x 3 with fluent speech NEURO: no focal motor/sensory deficits  MEDICAL HISTORY:  Past Medical History:  Diagnosis Date  . Anxiety   . Depression   . Gait difficulty   . Lymphoma ([place])   . Memory loss     SURGICAL HISTORY: Past Surgical History:  Procedure Laterality Date  . ABDOMINAL SURGERY     Childhood  . BUNIONECTOMY    . IR IMAGING GUIDED PORT INSERTION  06/30/2020  . IR PARACENTESIS  06/26/2020  . IR US GUIDE BX ASP/DRAIN  06/26/2020    SOCIAL HISTORY: Social History   Socioeconomic History  . Marital status: Married    Spouse name: Not on file  . Number of children: 0  . Years of education: Veterinary surgeon  . Highest education level: Not on file  Occupational History  . Occupation: Scientist, research (physical sciences)  Tobacco Use  . Smoking status: Never Smoker  . Smokeless tobacco: Never  Used  Vaping Use  . Vaping Use: Never used  Substance and Sexual Activity  . Alcohol use: No    Alcohol/week: 0.0 standard drinks    Comment: Less than one drink per week.  . Drug use: No  . Sexual activity: Not Currently  Other Topics Concern  . Not on file  Social History Narrative   Lives at home with wife.   Right-handed.   3 cups caffeine per day.   Social Determinants of Health   Financial Resource Strain:   .  Difficulty of Paying Living Expenses: Not on file  Food Insecurity:   . Worried About Charity fundraiser in the Last Year: Not on file  . Ran Out of Food in the Last Year: Not on file  Transportation Needs:   . Lack of Transportation (Medical): Not on file  . Lack of Transportation (Non-Medical): Not on file  Physical Activity:   . Days of Exercise per Week: Not on file  . Minutes of Exercise per Session: Not on file  Stress:   . Feeling of Stress : Not on file  Social Connections:   . Frequency of Communication with Friends and Family: Not on file  . Frequency of Social Gatherings with Friends and Family: Not on file  . Attends Religious Services: Not on file  . Active Member of Clubs or Organizations: Not on file  . Attends Archivist Meetings: Not on file  . Marital Status: Not on file  Intimate Partner Violence:   . Fear of Current or Ex-Partner: Not on file  . Emotionally Abused: Not on file  . Physically Abused: Not on file  . Sexually Abused: Not on file    FAMILY HISTORY: Family History  Problem Relation Age of Onset  . Aneurysm Mother 5       Thoracic  . Hypertension Father   . Heart failure Father   . Heart disease Father   . Heart attack Paternal Grandfather   . Depression Cousin   . Bipolar disorder Cousin   . Schizophrenia Maternal Uncle     ALLERGIES:  is allergic to omeprazole.  MEDICATIONS:  Scheduled Meds: . allopurinol  100 mg Oral BID  . Chlorhexidine Gluconate Cloth  6 each Topical Daily  . enoxaparin (LOVENOX) injection  40 mg Subcutaneous Q24H  . heparin lock flush  500 Units Intracatheter Q30 days  . pantoprazole  40 mg Oral Daily  . predniSONE  60 mg Oral Q breakfast  . senna  1 tablet Oral BID   Continuous Infusions: PRN Meds:.albuterol, alum & mag hydroxide-simeth, bisacodyl, heparin lock flush **AND** heparin lock flush, HYDROmorphone (DILAUDID) injection, LORazepam, [START ON 07/03/2020] ondansetron (ZOFRAN) IV,  oxyCODONE-acetaminophen, polyethylene glycol, prochlorperazine  REVIEW OF SYSTEMS:    10 Point review of Systems was done is negative except as noted above.   LABORATORY DATA:  I have reviewed the data as listed  . CBC Latest Ref Rng & Units 07/02/2020 07/01/2020 06/28/2020  WBC 4.0 - 10.5 K/uL 7.7 6.9 5.3  Hemoglobin 13.0 - 17.0 g/dL 12.6(L) 14.4 12.3(L)  Hematocrit 39 - 52 % 37.9(L) 44.6 36.9(L)  Platelets 150 - 400 K/uL 304 361 297    . CMP Latest Ref Rng & Units 07/02/2020 07/01/2020 06/29/2020  Glucose 70 - 99 mg/dL 102(H) 96 99  BUN 8 - 23 mg/dL 28(H) 22 19  Creatinine 0.61 - 1.24 mg/dL 0.74 0.96 0.90  Sodium 135 - 145 mmol/L 135 138 139  Potassium 3.5 -  5.1 mmol/L 4.2 4.2 4.3  Chloride 98 - 111 mmol/L 101 101 102  CO2 22 - 32 mmol/L 26 25 27   Calcium 8.9 - 10.3 mg/dL 8.3(L) 9.0 8.5(L)  Total Protein 6.5 - 8.1 g/dL 5.0(L) 5.7(L) 5.1(L)  Total Bilirubin 0.3 - 1.2 mg/dL 0.7 0.7 0.4  Alkaline Phos 38 - 126 U/L 75 93 86  AST 15 - 41 U/L 54(H) 65(H) 75(H)  ALT 0 - 44 U/L 20 26 32     RADIOGRAPHIC STUDIES:  CT Angio Chest PE W and/or Wo Contrast  Result Date: 06/24/2020 CLINICAL DATA:  64 year old male with recent diagnosis of lymphoma presenting with shortness of breath. Concern for pulmonary embolism. EXAM: CT ANGIOGRAPHY CHEST CT ABDOMEN AND PELVIS WITH CONTRAST TECHNIQUE: Multidetector CT imaging of the chest was performed using the standard protocol during bolus administration of intravenous contrast. Multiplanar CT image reconstructions and MIPs were obtained to evaluate the vascular anatomy. Multidetector CT imaging of the abdomen and pelvis was performed using the standard protocol during bolus administration of intravenous contrast. CONTRAST:  168mL OMNIPAQUE IOHEXOL 350 MG/ML SOLN COMPARISON:  Chest radiograph dated 06/24/2020. FINDINGS: Evaluation of this exam is limited due to respiratory motion artifact. CTA CHEST FINDINGS Cardiovascular: There is no cardiomegaly  or pericardial effusion. The thoracic aorta is unremarkable. Evaluation of the pulmonary arteries is limited due to respiratory motion artifact and suboptimal opacification and timing of the contrast. No large or central pulmonary artery embolus identified. Mediastinum/Nodes: There is no hilar adenopathy. Enlarged lymph nodes noted anterior to the heart and in the anterior cardiophrenic space. The esophagus and the thyroid gland are grossly unremarkable. No mediastinal fluid collection. Lungs/Pleura: Small to moderate bilateral pleural effusions with associated partial compressive atelectasis the lower lobes. Pneumonia is not excluded clinical correlation is recommended. There is no pneumothorax. The central airways are patent. Musculoskeletal: No chest wall abnormality. No acute or significant osseous findings. Review of the MIP images confirms the above findings. CT ABDOMEN and PELVIS FINDINGS No intra-abdominal free air. There is moderate ascites. Hepatobiliary: The liver is unremarkable. No intrahepatic biliary ductal dilatation. The gallbladder is unremarkable. Pancreas: The pancreas is suboptimally visualized but grossly unremarkable. Spleen: Normal in size without focal abnormality. Adrenals/Urinary Tract: The adrenal glands unremarkable. There is mild left hydronephrosis. There is delayed excretion of contrast by left kidney. The right kidney is unremarkable. The urinary bladder is grossly unremarkable. Stomach/Bowel: There is moderate stool throughout the colon. There is no bowel obstruction. The appendix is not visualized with certainty. No inflammatory changes identified in the right lower quadrant. Vascular/Lymphatic: Mild aortoiliac atherosclerotic disease. The IVC is grossly unremarkable. No portal venous gas. Extensive retroperitoneal adenopathy encasing the abdominal aorta and iliac arteries. There is diffuse mesenteric and omental implants and caking. Large soft tissue mass in the mesentery encasing  the SMA measures approximately 11 x 17 cm in axial dimensions and 15 cm in craniocaudal length. Reproductive: The prostate is grossly unremarkable. Other: Diffuse subcutaneous edema. Small fat containing umbilical hernia. Musculoskeletal: No acute or significant osseous findings. Review of the MIP images confirms the above findings. IMPRESSION: 1. No CT evidence of central pulmonary artery embolus. 2. Small to moderate bilateral pleural effusions with associated partial compressive atelectasis of the lower lobes. Pneumonia is not excluded clinical correlation is recommended. 3. Extensive retroperitoneal, mesenteric, and omental adenopathy in keeping with history of lymphoma. Enlarged lymph nodes in the anterior cardiophrenic space. 4. Moderate ascites. 5. Mild left hydronephrosis. 6. Aortic Atherosclerosis (ICD10-I70.0). Electronically Signed   By:  Anner Crete M.D.   On: 06/24/2020 16:02   CT ABDOMEN PELVIS W CONTRAST  Result Date: 06/24/2020 CLINICAL DATA:  64 year old male with recent diagnosis of lymphoma presenting with shortness of breath. Concern for pulmonary embolism. EXAM: CT ANGIOGRAPHY CHEST CT ABDOMEN AND PELVIS WITH CONTRAST TECHNIQUE: Multidetector CT imaging of the chest was performed using the standard protocol during bolus administration of intravenous contrast. Multiplanar CT image reconstructions and MIPs were obtained to evaluate the vascular anatomy. Multidetector CT imaging of the abdomen and pelvis was performed using the standard protocol during bolus administration of intravenous contrast. CONTRAST:  158mL OMNIPAQUE IOHEXOL 350 MG/ML SOLN COMPARISON:  Chest radiograph dated 06/24/2020. FINDINGS: Evaluation of this exam is limited due to respiratory motion artifact. CTA CHEST FINDINGS Cardiovascular: There is no cardiomegaly or pericardial effusion. The thoracic aorta is unremarkable. Evaluation of the pulmonary arteries is limited due to respiratory motion artifact and suboptimal  opacification and timing of the contrast. No large or central pulmonary artery embolus identified. Mediastinum/Nodes: There is no hilar adenopathy. Enlarged lymph nodes noted anterior to the heart and in the anterior cardiophrenic space. The esophagus and the thyroid gland are grossly unremarkable. No mediastinal fluid collection. Lungs/Pleura: Small to moderate bilateral pleural effusions with associated partial compressive atelectasis the lower lobes. Pneumonia is not excluded clinical correlation is recommended. There is no pneumothorax. The central airways are patent. Musculoskeletal: No chest wall abnormality. No acute or significant osseous findings. Review of the MIP images confirms the above findings. CT ABDOMEN and PELVIS FINDINGS No intra-abdominal free air. There is moderate ascites. Hepatobiliary: The liver is unremarkable. No intrahepatic biliary ductal dilatation. The gallbladder is unremarkable. Pancreas: The pancreas is suboptimally visualized but grossly unremarkable. Spleen: Normal in size without focal abnormality. Adrenals/Urinary Tract: The adrenal glands unremarkable. There is mild left hydronephrosis. There is delayed excretion of contrast by left kidney. The right kidney is unremarkable. The urinary bladder is grossly unremarkable. Stomach/Bowel: There is moderate stool throughout the colon. There is no bowel obstruction. The appendix is not visualized with certainty. No inflammatory changes identified in the right lower quadrant. Vascular/Lymphatic: Mild aortoiliac atherosclerotic disease. The IVC is grossly unremarkable. No portal venous gas. Extensive retroperitoneal adenopathy encasing the abdominal aorta and iliac arteries. There is diffuse mesenteric and omental implants and caking. Large soft tissue mass in the mesentery encasing the SMA measures approximately 11 x 17 cm in axial dimensions and 15 cm in craniocaudal length. Reproductive: The prostate is grossly unremarkable. Other:  Diffuse subcutaneous edema. Small fat containing umbilical hernia. Musculoskeletal: No acute or significant osseous findings. Review of the MIP images confirms the above findings. IMPRESSION: 1. No CT evidence of central pulmonary artery embolus. 2. Small to moderate bilateral pleural effusions with associated partial compressive atelectasis of the lower lobes. Pneumonia is not excluded clinical correlation is recommended. 3. Extensive retroperitoneal, mesenteric, and omental adenopathy in keeping with history of lymphoma. Enlarged lymph nodes in the anterior cardiophrenic space. 4. Moderate ascites. 5. Mild left hydronephrosis. 6. Aortic Atherosclerosis (ICD10-I70.0). Electronically Signed   By: Anner Crete M.D.   On: 06/24/2020 16:02   NM PET Image Initial (PI) Skull Base To Thigh  Result Date: 06/21/2020 CLINICAL DATA:  Initial treatment strategy for non-Hodgkin lymphoma. EXAM: NUCLEAR MEDICINE PET SKULL BASE TO THIGH TECHNIQUE: 9.5 mCi F-18 FDG was injected intravenously. Full-ring PET imaging was performed from the skull base to thigh after the radiotracer. CT data was obtained and used for attenuation correction and anatomic localization. Fasting blood glucose: 90  mg/dl COMPARISON:  CT abdomen pelvis 06/05/2020, coronary CT 01/28/2020 and CT chest 11/17/2017. FINDINGS: Mediastinal blood pool activity: SUV max 0.8 Liver activity: SUV max 3.2 NECK: Mild hypermetabolic lymph nodes in the neck are seen with an index left level 2 lymph node measuring approximately 6 mm (4/27) and fall SUV max of 4.0. Incidental CT findings: None. CHEST: Hypermetabolic low internal jugular mediastinal, internal mammary and axillary lymph nodes. Index low left paratracheal lymph measures 8 mm (4/64) with an SUV max of 6.8. Clustered prepericardiac lymph nodes measure up to 12 mm (4/90) with an SUV max of 10.0. No hypermetabolic pulmonary nodules. Incidental CT findings: Coronary artery calcification. Heart is mildly  enlarged. No pericardial effusion. Moderate bilateral pleural effusions with compressive atelectasis in both lower lobes. ABDOMEN/PELVIS: Hypermetabolic adenopathy is seen throughout the abdominal peritoneal ligaments, retroperitoneum and mesenteries. Conglomerate nodal mesenteric mass is difficult to differentiate from small bowel, which may be involved, with an SUV max of 14.4, measuring approximately 8.6 x 12.6 cm (4/137). Index gastrohepatic ligament lymph node measures approximately 2.3 cm (4/110) with an SUV max of 11.4. Extensive hypermetabolic peritoneal/omental thickening/caking, with an index 1.9 x 3.6 cm left omental nodule (4/121), SUV max 11.8. Inguinal lymph nodes measure up to 1.5 cm on the left (4/202) with an SUV max of 1.7. No abnormal hypermetabolism in the liver, adrenal glands, spleen or pancreas. Incidental CT findings: Visualized portion of the liver is grossly unremarkable. There may be sludge in the gallbladder. Adrenal glands and right kidney are grossly unremarkable. There may be a punctate stone in the lower pole left kidney. Left pelvicaliceal fullness versus mild hydronephrosis. Spleen is grossly unremarkable. Bladder appears thick-walled but is under distended. Ascites. SKELETON: No abnormal osseous hypermetabolism. Incidental CT findings: Degenerative changes in the spine. IMPRESSION: 1. Hypermetabolic adenopathy throughout the neck, chest, abdomen and pelvis. Findings are worst in the abdomen, with extensive peritoneal carcinomatosis. Small bowel involvement is not excluded. Findings are consistent with the provided history of lymphoma, [place] 5. 2. Moderate bilateral pleural effusions with compressive atelectasis in both lower lobes. 3. Left pelvicaliceal fullness versus mild hydronephrosis. 4. Question punctate left renal stone. Electronically Signed   By: Lorin Picket M.D.   On: 06/21/2020 09:04   US Abdomen Limited  Result Date: 06/29/2020 CLINICAL DATA:  Question  ascites. EXAM: LIMITED ABDOMEN ULTRASOUND FOR ASCITES TECHNIQUE: Limited ultrasound survey for ascites was performed in all four abdominal quadrants. COMPARISON:  CT 06/24/2020 FINDINGS: Four-quadrant scanning shows a small amount of ascites without any accessible collection. This appears considerably diminished in comparison to the CT of 5 days ago. IMPRESSION: Small amount of ascites. Amount appears considerably reduced compared with a CT scan of 5 days ago. Electronically Signed   By: Nelson Chimes M.D.   On: 06/29/2020 15:24   IR US Guide Bx Asp/Drain  Result Date: 06/26/2020 INDICATION: Concern for lymphoma. Please perform ultrasound-guided biopsy of hypermetabolic inguinal lymph node for tissue diagnostic purposes Additionally, patient currently admitted with abdominal pain and distension and as such request made for ascites search ultrasound ultrasound-guided paracentesis for diagnostic and therapeutic purposes as indicated. EXAM: 1. ULTRASOUND-GUIDED RIGHT INGUINAL LYMPH NODE BIOPSY 2. ULTRASOUND-GUIDED PARACENTESIS COMPARISON:  CT of the chest, abdomen and pelvis- 06/24/2020; PET-CT-06/21/2020 MEDICATIONS: None. COMPLICATIONS: None immediate. TECHNIQUE: Informed written consent was obtained from the patient after a discussion of the risks, benefits and alternatives to treatment. A timeout was performed prior to the initiation of the procedure. Initial ultrasound scanning demonstrates a small amount of intra-abdominal  ascites with dominant pocket located within right upper abdomen adjacent to the hepatic parenchyma. As such, the skin overlying the right upper abdomen was subsequently prepped and draped in the usual sterile fashion. 1% lidocaine with epinephrine was used for local anesthesia. Under direct ultrasound guidance, an 8 Fr Safe-T-Centesis catheter was introduced. The paracentesis was performed. A small amount of aspirated fluid was capped and sent to the laboratory for cytologic analysis as  well as triglyceride levels. The catheter was removed and a dressing was applied. Next, attention was paid towards ultrasound-guided biopsy of known hypermetabolic right inguinal lymphadenopathy. Sonographic evaluation of the right groin demonstrates an at least 2.4 x 0.8 cm right inguinal lymph node correlating with the dominant right inguinal lymph node seen on preceding abdominal CT image 90, series 2. The procedure was planned. The right groin was prepped and draped in usual sterile fashion. After the overlying soft tissues anesthetized 1% lidocaine with epinephrine, an 18 gauge core needle biopsy device was utilized to obtain 6 core needle biopsy samples. Multiple ultrasound images were saved procedural documentation purposes. Samples were placed in saline and submitted to the laboratory for pathologic analysis. The patient tolerated the above procedures well without immediate postprocedural complication. FINDINGS: A total of approximately 2 liters of chylous fluid was removed. Samples were sent to the laboratory as requested by the clinical team. Successful ultrasound-guided biopsy dominant right inguinal lymph node IMPRESSION: 1. Successful ultrasound-guided biopsy of dominant right inguinal node. 2. Successful ultrasound-guided paracentesis yielding 2 liters of chylous appearing fluid. Fluid was sent to the laboratory for cytologic analysis as well as triglyceride levels. Electronically Signed   By: Sandi Mariscal M.D.   On: 06/26/2020 13:16   DG Chest Port 1 View  Result Date: 06/24/2020 CLINICAL DATA:  Abdominal pain and swelling.  History of lymphoma. EXAM: PORTABLE CHEST 1 VIEW COMPARISON:  Chest radiograph 11/17/2017 FINDINGS: Normal cardiac and mediastinal contours. Small bilateral pleural effusions with underlying consolidation. No pleural effusion or pneumothorax. Osseous structures unremarkable. IMPRESSION: Small bilateral pleural effusions with underlying consolidation. Electronically Signed   By:  Lovey Newcomer M.D.   On: 06/24/2020 14:11   ECHOCARDIOGRAM COMPLETE  Result Date: 06/25/2020    ECHOCARDIOGRAM REPORT   Patient Name:   OMARRI EICH Date of Exam: 06/25/2020 Medical Rec #:  [id number]      Height:       73.0 in Accession #:    [id number]     Weight:       189.0 lb Date of Birth:  August 17, 1956      BSA:          2.101 m Patient Age:    64 years       BP:           136/92 mmHg Patient Gender: M              HR:           120 bpm. Exam Location:  Inpatient Procedure: 2D Echo Indications:    786.09 dyspnea  History:        Patient has prior history of Echocardiogram examinations, most                 recent 08/11/2019. Lymphoma.  Sonographer:    Jannett Celestine RDCS (AE) Referring Phys: [phone] [facility]  Sonographer Comments: Suboptimal parasternal window and suboptimal subcostal window. IMPRESSIONS  1. Left ventricular ejection fraction, by estimation, is 65 to 70%. The left ventricle has normal function. The  left ventricle has no regional wall motion abnormalities. Left ventricular diastolic parameters are consistent with Grade I diastolic dysfunction (impaired relaxation).  2. Right ventricular systolic function is hyperdynamic. The right ventricular size is normal.  3. The mitral valve is normal in structure. No evidence of mitral valve regurgitation. No evidence of mitral stenosis.  4. The aortic valve is normal in structure. Aortic valve regurgitation is not visualized. No aortic stenosis is present. FINDINGS  Left Ventricle: Left ventricular ejection fraction, by estimation, is 65 to 70%. The left ventricle has normal function. The left ventricle has no regional wall motion abnormalities. The left ventricular internal cavity size was normal in size. There is  no left ventricular hypertrophy. Left ventricular diastolic parameters are consistent with Grade I diastolic dysfunction (impaired relaxation). Right Ventricle: The right ventricular size is normal. No increase in right ventricular  wall thickness. Right ventricular systolic function is hyperdynamic. Left Atrium: Left atrial size was normal in size. Right Atrium: Right atrial size was normal in size. Pericardium: There is no evidence of pericardial effusion. Mitral Valve: The mitral valve is normal in structure. No evidence of mitral valve regurgitation. No evidence of mitral valve stenosis. Tricuspid Valve: The tricuspid valve is normal in structure. Tricuspid valve regurgitation is trivial. No evidence of tricuspid stenosis. Aortic Valve: The aortic valve is normal in structure. Aortic valve regurgitation is not visualized. No aortic stenosis is present. Pulmonic Valve: The pulmonic valve was normal in structure. Pulmonic valve regurgitation is not visualized. No evidence of pulmonic stenosis. Aorta: The aortic root is normal in size and structure. Venous: The inferior vena cava was not well visualized. IAS/Shunts: No atrial level shunt detected by color flow Doppler.  LEFT VENTRICLE PLAX 2D LVOT diam:     2.50 cm LV SV:         72 LV SV Index:   34 LVOT Area:     4.91 cm  RIGHT VENTRICLE RV S prime:     18.00 cm/s TAPSE (M-mode): 1.8 cm LEFT ATRIUM             Index       RIGHT ATRIUM           Index LA Vol (A2C):   60.4 ml 28.75 ml/m RA Area:     15.80 cm LA Vol (A4C):   42.5 ml 20.23 ml/m RA Volume:   32.40 ml  15.42 ml/m LA Biplane Vol: 54.0 ml 25.71 ml/m  AORTIC VALVE LVOT Vmax:   95.30 cm/s LVOT Vmean:  74.500 cm/s LVOT VTI:    0.146 m MITRAL VALVE               TRICUSPID VALVE MV Area (PHT): 3.31 cm    TR Peak grad:   13.7 mmHg MV Decel Time: 229 msec    TR Vmax:        185.00 cm/s MV E velocity: 69.40 cm/s MV A velocity: 87.00 cm/s  SHUNTS MV E/A ratio:  0.80        Systemic VTI:  0.15 m                            Systemic Diam: 2.50 cm Skeet Latch MD Electronically signed by Skeet Latch MD Signature Date/Time: 06/25/2020/4:06:39 PM    Final    IR IMAGING GUIDED PORT INSERTION  Result Date: 06/30/2020 CLINICAL  DATA:  High-grade follicular lymphoma and need for porta cath to begin chemotherapy. EXAM: IMPLANTED  PORT A CATH PLACEMENT WITH ULTRASOUND AND FLUOROSCOPIC GUIDANCE ANESTHESIA/SEDATION: 4.0 mg IV Versed; 100 mcg IV Fentanyl Total Moderate Sedation Time:  32 minutes The patient's level of consciousness and physiologic status were continuously monitored during the procedure by Radiology nursing. Additional Medications: 2 g IV Ancef. FLUOROSCOPY TIME:  24 seconds.  9.0 mGy. PROCEDURE: The procedure, risks, benefits, and alternatives were explained to the patient. Questions regarding the procedure were encouraged and answered. The patient understands and consents to the procedure. A time-out was performed prior to initiating the procedure. Ultrasound was utilized to confirm patency of the right internal jugular vein. The right neck and chest were prepped with chlorhexidine in a sterile fashion, and a sterile drape was applied covering the operative field. Maximum barrier sterile technique with sterile gowns and gloves were used for the procedure. Local anesthesia was provided with 1% lidocaine. After creating a small venotomy incision, a 21 gauge needle was advanced into the right internal jugular vein under direct, real-time ultrasound guidance. Ultrasound image documentation was performed. After securing guidewire access, an 8 Fr dilator was placed. A J-wire was kinked to measure appropriate catheter length. A subcutaneous port pocket was then created along the upper chest wall utilizing sharp and blunt dissection. Portable cautery was utilized. The pocket was irrigated with sterile saline. A single lumen power injectable port was chosen for placement. The 8 Fr catheter was tunneled from the port pocket site to the venotomy incision. The port was placed in the pocket. External catheter was trimmed to appropriate length based on guidewire measurement. At the venotomy, an 8 Fr peel-away sheath was placed over a  guidewire. The catheter was then placed through the sheath and the sheath removed. Final catheter positioning was confirmed and documented with a fluoroscopic spot image. The port was accessed with a needle and aspirated and flushed with heparinized saline. The access needle was removed. The venotomy and port pocket incisions were closed with subcutaneous 3-0 Monocryl and subcuticular 4-0 Vicryl. Dermabond was applied to both incisions. COMPLICATIONS: COMPLICATIONS None FINDINGS: After catheter placement, the tip lies at the cavo-atrial junction. The catheter aspirates normally and is ready for immediate use. IMPRESSION: Placement of single lumen port a cath via right internal jugular vein. The catheter tip lies at the cavo-atrial junction. A power injectable port a cath was placed and is ready for immediate use. Electronically Signed   By: Aletta Edouard M.D.   On: 06/30/2020 16:58   VAS [place] LOWER EXTREMITY VENOUS (DVT)  Result Date: 06/29/2020  Lower Venous DVTStudy Indications: Edema.  Risk Factors: Underlying diagnosis of Non-Hodgkin's lymphoma. Limitations: Poor ultrasound/tissue interface. Performing Technologist: Maudry Mayhew MHA, RDMS, RVT, RDCS  Examination Guidelines: A complete evaluation includes B-mode imaging, spectral Doppler, color Doppler, and power Doppler as needed of all accessible portions of each vessel. Bilateral testing is considered an integral part of a complete examination. Limited examinations for reoccurring indications may be performed as noted. The reflux portion of the exam is performed with the patient in reverse Trendelenburg.  +---------+---------------+---------+-----------+----------+--------------+ RIGHT    CompressibilityPhasicitySpontaneityPropertiesThrombus Aging +---------+---------------+---------+-----------+----------+--------------+ CFV      Full           Yes      Yes                                  +---------+---------------+---------+-----------+----------+--------------+ SFJ      Full                                                        +---------+---------------+---------+-----------+----------+--------------+  FV Prox  Full                                                        +---------+---------------+---------+-----------+----------+--------------+ FV Mid   Full                                                        +---------+---------------+---------+-----------+----------+--------------+ FV DistalFull                                                        +---------+---------------+---------+-----------+----------+--------------+ PFV      Full                                                        +---------+---------------+---------+-----------+----------+--------------+ POP      Full           Yes      Yes                                 +---------+---------------+---------+-----------+----------+--------------+ PTV      Full                                                        +---------+---------------+---------+-----------+----------+--------------+ PERO     Full                                                        +---------+---------------+---------+-----------+----------+--------------+   +----+---------------+---------+-----------+----------+--------------+ LEFTCompressibilityPhasicitySpontaneityPropertiesThrombus Aging +----+---------------+---------+-----------+----------+--------------+ CFV Full           Yes      Yes                                 +----+---------------+---------+-----------+----------+--------------+     Summary: RIGHT: - There is no evidence of deep vein thrombosis in the lower extremity.  - No cystic structure found in the popliteal fossa. - Ultrasound characteristics of enlarged lymph nodes are noted in the groin.  LEFT: - No evidence of common femoral vein obstruction. - Ultrasound  characteristics of enlarged lymph nodes noted in the groin.  *See table(s) above for measurements and observations. Electronically signed by Servando Snare MD on 06/29/2020 at 5:37:15 PM.    Final    IR Paracentesis  Result Date: 06/26/2020 INDICATION: Concern for lymphoma. Please perform ultrasound-guided biopsy of hypermetabolic inguinal lymph node for tissue diagnostic purposes Additionally, patient currently admitted with abdominal  pain and distension and as such request made for ascites search ultrasound ultrasound-guided paracentesis for diagnostic and therapeutic purposes as indicated. EXAM: 1. ULTRASOUND-GUIDED RIGHT INGUINAL LYMPH NODE BIOPSY 2. ULTRASOUND-GUIDED PARACENTESIS COMPARISON:  CT of the chest, abdomen and pelvis- 06/24/2020; PET-CT-06/21/2020 MEDICATIONS: None. COMPLICATIONS: None immediate. TECHNIQUE: Informed written consent was obtained from the patient after a discussion of the risks, benefits and alternatives to treatment. A timeout was performed prior to the initiation of the procedure. Initial ultrasound scanning demonstrates a small amount of intra-abdominal ascites with dominant pocket located within right upper abdomen adjacent to the hepatic parenchyma. As such, the skin overlying the right upper abdomen was subsequently prepped and draped in the usual sterile fashion. 1% lidocaine with epinephrine was used for local anesthesia. Under direct ultrasound guidance, an 8 Fr Safe-T-Centesis catheter was introduced. The paracentesis was performed. A small amount of aspirated fluid was capped and sent to the laboratory for cytologic analysis as well as triglyceride levels. The catheter was removed and a dressing was applied. Next, attention was paid towards ultrasound-guided biopsy of known hypermetabolic right inguinal lymphadenopathy. Sonographic evaluation of the right groin demonstrates an at least 2.4 x 0.8 cm right inguinal lymph node correlating with the dominant right inguinal  lymph node seen on preceding abdominal CT image 90, series 2. The procedure was planned. The right groin was prepped and draped in usual sterile fashion. After the overlying soft tissues anesthetized 1% lidocaine with epinephrine, an 18 gauge core needle biopsy device was utilized to obtain 6 core needle biopsy samples. Multiple ultrasound images were saved procedural documentation purposes. Samples were placed in saline and submitted to the laboratory for pathologic analysis. The patient tolerated the above procedures well without immediate postprocedural complication. FINDINGS: A total of approximately 2 liters of chylous fluid was removed. Samples were sent to the laboratory as requested by the clinical team. Successful ultrasound-guided biopsy dominant right inguinal lymph node IMPRESSION: 1. Successful ultrasound-guided biopsy of dominant right inguinal node. 2. Successful ultrasound-guided paracentesis yielding 2 liters of chylous appearing fluid. Fluid was sent to the laboratory for cytologic analysis as well as triglyceride levels. Electronically Signed   By: Sandi Mariscal M.D.   On: 06/26/2020 13:16    ASSESSMENT & PLAN:   64 year old gentleman with  #1 extensive cervical thoracic and primarily abdominal pelvic lymphadenopathy consistent with high-grade follicular lymphoma. #2 abdominal distention and discomfort along with newly noted chylous effusion showing lymphocytic predominance. Patient status post paracentesis with removal of 2 L of fluid with improvement in symptoms. #3 shortness of breath-echo shows normal ejection fraction.  CT chest negative for PE or pneumonia.  Negative enzymes.  Plan -Status post inguinal lymph node biopsy as well as paracentesis with removal of 2 L of chylous effusion on 06/26/2020 with improvement in his abdominal discomfort and shortness of breath.  He continues to have abdominal distention and some mild shortness of breath.  He was reevaluated by interventional  radiology yesterday for possible paracentesis, but not enough fluid to draw off and procedure was held. -He has ongoing lower extremity edema, but now the right leg is more edematous than the left.  Bilateral Doppler ultrasound of lower extremities was negative for DVT.  Edema likely due to worsening adenopathy.   -Pathology report consistent with high-grade follicular B-cell lymphoma.  The patient is symptomatic and is at high risk for TLS and has ascites so we will plan to give first cycle of chemotherapy as an inpatient.  Plan to proceed with R-CHOP once  Port-A-Cath is placed and he is transferred to the oncology unit for chemotherapy administration.  Discussed with inpatient oncology unit and chemotherapy will be given 10/16.  Will plan to hold rituximab over the weekend.  If he remains in the hospital, will plan to give rituximab early next week.  However, if he is discharged will arrange for outpatient rituximab in our office next week.  I have also tentatively scheduled him for an outpatient Neulasta or Udenyca injection in our office on 10/18 if he is discharged. -Adverse effects of chemotherapy have been discussed including but not limited to alopecia, myelosuppression, nausea, vomiting, cardiotoxicity with Adriamycin, renal and hepatic dysfunction, risk of infusion reaction with rituximab.  The patient is agreeable to proceed. -Uric acid level was elevated and he was started on allopurinol due to concern for tumor lysis syndrome.  Uric acid level has normalized. -Lorazepam as needed for insomnia or anxiety or tremulousness related to high-dose steroids. -Continue allopurinol 100 mg p.o. twice daily. - continue prednisone 60 mg p.o. daily as part of his R-CHOP regimen. This should be continued Day 1-5 (Today is day 2)  --OK to d/c from Med Onc standpoint. Plan for Rituxan and Udenyca as outpatient on Monday, 07/03/2020.  Ledell Peoples, MD Department of Hematology/Oncology [place] at [facility] Phone: [phone] Pager: [phone] Email: [email or web address]

## 2020-07-02 NOTE — Progress Notes (Signed)
Pt discharged home with spouse in stable condition. Discharge instructions given. Scripts sent pharmacy of choice. No immediate questions or concerns at this time. Discharged from unit from via wheelchair

## 2020-07-03 ENCOUNTER — Inpatient Hospital Stay: Payer: 59 | Attending: Hematology

## 2020-07-03 ENCOUNTER — Ambulatory Visit (HOSPITAL_BASED_OUTPATIENT_CLINIC_OR_DEPARTMENT_OTHER): Payer: 59 | Admitting: Medical

## 2020-07-03 ENCOUNTER — Ambulatory Visit: Payer: 59

## 2020-07-03 ENCOUNTER — Other Ambulatory Visit: Payer: Self-pay

## 2020-07-03 ENCOUNTER — Other Ambulatory Visit: Payer: Self-pay | Admitting: Hematology

## 2020-07-03 VITALS — BP 121/81 | HR 112 | Temp 97.8°F | Resp 17

## 2020-07-03 DIAGNOSIS — Z452 Encounter for adjustment and management of vascular access device: Secondary | ICD-10-CM | POA: Insufficient documentation

## 2020-07-03 DIAGNOSIS — C8248 Follicular lymphoma grade IIIb, lymph nodes of multiple sites: Secondary | ICD-10-CM | POA: Insufficient documentation

## 2020-07-03 DIAGNOSIS — Z5112 Encounter for antineoplastic immunotherapy: Secondary | ICD-10-CM | POA: Diagnosis not present

## 2020-07-03 DIAGNOSIS — R1084 Generalized abdominal pain: Secondary | ICD-10-CM | POA: Diagnosis not present

## 2020-07-03 DIAGNOSIS — Z5189 Encounter for other specified aftercare: Secondary | ICD-10-CM | POA: Diagnosis not present

## 2020-07-03 DIAGNOSIS — Z7189 Other specified counseling: Secondary | ICD-10-CM

## 2020-07-03 MED ORDER — FAMOTIDINE IN NACL 20-0.9 MG/50ML-% IV SOLN
INTRAVENOUS | Status: AC
  Filled 2020-07-03: qty 50

## 2020-07-03 MED ORDER — MEPERIDINE HCL 25 MG/ML IJ SOLN
25.0000 mg | Freq: Once | INTRAMUSCULAR | Status: AC
  Administered 2020-07-03: 25 mg via INTRAVENOUS

## 2020-07-03 MED ORDER — SODIUM CHLORIDE 0.9% FLUSH
10.0000 mL | INTRAVENOUS | Status: DC | PRN
  Administered 2020-07-03: 10 mL via INTRAVENOUS
  Filled 2020-07-03: qty 10

## 2020-07-03 MED ORDER — MEPERIDINE HCL 25 MG/ML IJ SOLN
INTRAMUSCULAR | Status: AC
  Filled 2020-07-03: qty 1

## 2020-07-03 MED ORDER — METHYLPREDNISOLONE SODIUM SUCC 125 MG IJ SOLR
INTRAMUSCULAR | Status: AC
  Filled 2020-07-03: qty 2

## 2020-07-03 MED ORDER — FAMOTIDINE IN NACL 20-0.9 MG/50ML-% IV SOLN
20.0000 mg | Freq: Once | INTRAVENOUS | Status: AC
  Administered 2020-07-03: 20 mg via INTRAVENOUS

## 2020-07-03 MED ORDER — METHYLPREDNISOLONE SODIUM SUCC 125 MG IJ SOLR
80.0000 mg | Freq: Every day | INTRAMUSCULAR | Status: DC
  Administered 2020-07-03: 80 mg via INTRAVENOUS

## 2020-07-03 MED ORDER — ACETAMINOPHEN 325 MG PO TABS
ORAL_TABLET | ORAL | Status: AC
  Filled 2020-07-03: qty 2

## 2020-07-03 MED ORDER — ACETAMINOPHEN 325 MG PO TABS
650.0000 mg | ORAL_TABLET | Freq: Once | ORAL | Status: AC
  Administered 2020-07-03: 650 mg via ORAL

## 2020-07-03 MED ORDER — DIPHENHYDRAMINE HCL 25 MG PO CAPS
ORAL_CAPSULE | ORAL | Status: AC
  Filled 2020-07-03: qty 2

## 2020-07-03 MED ORDER — MORPHINE SULFATE (PF) 2 MG/ML IV SOLN
1.0000 mg | Freq: Once | INTRAVENOUS | Status: AC
  Administered 2020-07-03: 1 mg via INTRAVENOUS

## 2020-07-03 MED ORDER — SODIUM CHLORIDE 0.9 % IV SOLN
375.0000 mg/m2 | Freq: Once | INTRAVENOUS | Status: AC
  Administered 2020-07-03: 800 mg via INTRAVENOUS
  Filled 2020-07-03: qty 50

## 2020-07-03 MED ORDER — PEGFILGRASTIM-CBQV 6 MG/0.6ML ~~LOC~~ SOSY
PREFILLED_SYRINGE | SUBCUTANEOUS | Status: AC
  Filled 2020-07-03: qty 0.6

## 2020-07-03 MED ORDER — PEGFILGRASTIM-CBQV 6 MG/0.6ML ~~LOC~~ SOSY
6.0000 mg | PREFILLED_SYRINGE | Freq: Once | SUBCUTANEOUS | Status: AC
  Administered 2020-07-03: 6 mg via SUBCUTANEOUS

## 2020-07-03 MED ORDER — HEPARIN SOD (PORK) LOCK FLUSH 100 UNIT/ML IV SOLN
500.0000 [IU] | Freq: Once | INTRAVENOUS | Status: AC
  Administered 2020-07-03: 500 [IU] via INTRAVENOUS
  Filled 2020-07-03: qty 5

## 2020-07-03 MED ORDER — SODIUM CHLORIDE 0.9 % IV SOLN
INTRAVENOUS | Status: DC
  Filled 2020-07-03: qty 250

## 2020-07-03 MED ORDER — DIPHENHYDRAMINE HCL 25 MG PO CAPS
50.0000 mg | ORAL_CAPSULE | Freq: Once | ORAL | Status: AC
  Administered 2020-07-03: 50 mg via ORAL

## 2020-07-03 MED ORDER — MORPHINE SULFATE (PF) 2 MG/ML IV SOLN
INTRAVENOUS | Status: AC
  Filled 2020-07-03: qty 1

## 2020-07-03 NOTE — Progress Notes (Signed)
1445 - Pt reports abdominal pain. He has been having this pain at home, took percocet this AM and was asking if could have a dose while he is here. Secure chat sent to Dr. [name].  1450 - Pt reports feeling hot and flushed. Rituximab infusion paused. [employer] [facility] called to bedside. Pulse slightly elevated (110) but VSS otherwise.   Demerol and Morpine given per MAR.

## 2020-07-03 NOTE — Progress Notes (Signed)
1510 - ok to restart rituxan at 83 ml/hr for 42 ml's per V. Tanner PA  2876 - V. Tanner PA informed pt's heart rate still increasing, 125 @ present.  PA at bedside.  No new orders @ this time.

## 2020-07-03 NOTE — Progress Notes (Signed)
Ok to proceed with PA in progress.

## 2020-07-03 NOTE — Patient Instructions (Addendum)
[facility] Discharge Instructions for Patients Receiving Chemotherapy  Today you received the following immunotherapy agents Rituximab  To help prevent nausea and vomiting after your treatment, we encourage you to take your nausea medication as prescribed.   If you develop nausea and vomiting that is not controlled by your nausea medication, call the clinic.   BELOW ARE SYMPTOMS THAT SHOULD BE REPORTED IMMEDIATELY:  *FEVER GREATER THAN 100.5 F  *CHILLS WITH OR WITHOUT FEVER  NAUSEA AND VOMITING THAT IS NOT CONTROLLED WITH YOUR NAUSEA MEDICATION  *UNUSUAL SHORTNESS OF BREATH  *UNUSUAL BRUISING OR BLEEDING  TENDERNESS IN MOUTH AND THROAT WITH OR WITHOUT PRESENCE OF ULCERS  *URINARY PROBLEMS  *BOWEL PROBLEMS  UNUSUAL RASH Items with * indicate a potential emergency and should be followed up as soon as possible.  Feel free to call the clinic should you have any questions or concerns. The clinic phone number is [phone].  Please show the [facility] at check-in to the Emergency Department and triage nurse.  Rituximab injection What is this medicine? RITUXIMAB (ri TUX i mab) is a monoclonal antibody. It is used to treat certain types of cancer like non-Hodgkin lymphoma and chronic lymphocytic leukemia. It is also used to treat rheumatoid arthritis, granulomatosis with polyangiitis (or Wegener's granulomatosis), microscopic polyangiitis, and pemphigus vulgaris. This medicine may be used for other purposes; ask your health care provider or pharmacist if you have questions. COMMON BRAND NAME(S): Rituxan, RUXIENCE What should I tell my health care provider before I take this medicine? They need to know if you have any of these conditions:  heart disease  infection (especially a virus infection such as hepatitis B, chickenpox, cold sores, or herpes)  immune system problems  irregular heartbeat  kidney disease  low blood counts, like low white cell,  platelet, or red cell counts  lung or breathing disease, like asthma  recently received or scheduled to receive a vaccine  an unusual or allergic reaction to rituximab, other medicines, foods, dyes, or preservatives  pregnant or trying to get pregnant  breast-feeding How should I use this medicine? This medicine is for infusion into a vein. It is administered in a hospital or clinic by a specially trained health care professional. A special MedGuide will be given to you by the pharmacist with each prescription and refill. Be sure to read this information carefully each time. Talk to your pediatrician regarding the use of this medicine in children. This medicine is not approved for use in children. Overdosage: If you think you have taken too much of this medicine contact a poison control center or emergency room at once. NOTE: This medicine is only for you. Do not share this medicine with others. What if I miss a dose? It is important not to miss a dose. Call your doctor or health care professional if you are unable to keep an appointment. What may interact with this medicine?  cisplatin  live virus vaccines This list may not describe all possible interactions. Give your health care provider a list of all the medicines, herbs, non-prescription drugs, or dietary supplements you use. Also tell them if you smoke, drink alcohol, or use illegal drugs. Some items may interact with your medicine. What should I watch for while using this medicine? Your condition will be monitored carefully while you are receiving this medicine. You may need blood work done while you are taking this medicine. This medicine can cause serious allergic reactions. To reduce your risk you may  need to take medicine before treatment with this medicine. Take your medicine as directed. In some patients, this medicine may cause a serious brain infection that may cause death. If you have any problems seeing, thinking,  speaking, walking, or standing, tell your healthcare professional right away. If you cannot reach your healthcare professional, urgently seek other source of medical care. Call your doctor or health care professional for advice if you get a fever, chills or sore throat, or other symptoms of a cold or flu. Do not treat yourself. This drug decreases your body's ability to fight infections. Try to avoid being around people who are sick. Do not become pregnant while taking this medicine or for at least 12 months after stopping it. Women should inform their doctor if they wish to become pregnant or think they might be pregnant. There is a potential for serious side effects to an unborn child. Talk to your health care professional or pharmacist for more information. Do not breast-feed an infant while taking this medicine or for at least 6 months after stopping it. What side effects may I notice from receiving this medicine? Side effects that you should report to your doctor or health care professional as soon as possible:  allergic reactions like skin rash, itching or hives; swelling of the face, lips, or tongue  breathing problems  chest pain  changes in vision  diarrhea  headache with fever, neck stiffness, sensitivity to light, nausea, or confusion  fast, irregular heartbeat  loss of memory  low blood counts - this medicine may decrease the number of white blood cells, red blood cells and platelets. You may be at increased risk for infections and bleeding.  mouth sores  problems with balance, talking, or walking  redness, blistering, peeling or loosening of the skin, including inside the mouth  signs of infection - fever or chills, cough, sore throat, pain or difficulty passing urine  signs and symptoms of kidney injury like trouble passing urine or change in the amount of urine  signs and symptoms of liver injury like dark yellow or brown urine; general ill feeling or flu-like  symptoms; light-colored stools; loss of appetite; nausea; right upper belly pain; unusually weak or tired; yellowing of the eyes or skin  signs and symptoms of low blood pressure like dizziness; feeling faint or lightheaded, falls; unusually weak or tired  stomach pain  swelling of the ankles, feet, hands  unusual bleeding or bruising  vomiting Side effects that usually do not require medical attention (report to your doctor or health care professional if they continue or are bothersome):  headache  joint pain  muscle cramps or muscle pain  nausea  tiredness This list may not describe all possible side effects. Call your doctor for medical advice about side effects. You may report side effects to FDA at 1-800-FDA-1088. Where should I keep my medicine? This drug is given in a hospital or clinic and will not be stored at home. NOTE: This sheet is a summary. It may not cover all possible information. If you have questions about this medicine, talk to your doctor, pharmacist, or health care provider.  2020 Elsevier/Gold Standard (2018-10-14 22:01:36)  Pegfilgrastim injection What is this medicine? PEGFILGRASTIM (PEG fil gra stim) is a long-acting granulocyte colony-stimulating factor that stimulates the growth of neutrophils, a type of white blood cell important in the body's fight against infection. It is used to reduce the incidence of fever and infection in patients with certain types of cancer who  are receiving chemotherapy that affects the bone marrow, and to increase survival after being exposed to high doses of radiation. This medicine may be used for other purposes; ask your health care provider or pharmacist if you have questions. COMMON BRAND NAME(S): Steve Rattler, Ziextenzo What should I tell my health care provider before I take this medicine? They need to know if you have any of these conditions:  kidney disease  latex allergy  ongoing radiation  therapy  sickle cell disease  skin reactions to acrylic adhesives (On-Body Injector only)  an unusual or allergic reaction to pegfilgrastim, filgrastim, other medicines, foods, dyes, or preservatives  pregnant or trying to get pregnant  breast-feeding How should I use this medicine? This medicine is for injection under the skin. If you get this medicine at home, you will be taught how to prepare and give the pre-filled syringe or how to use the On-body Injector. Refer to the patient Instructions for Use for detailed instructions. Use exactly as directed. Tell your healthcare provider immediately if you suspect that the On-body Injector may not have performed as intended or if you suspect the use of the On-body Injector resulted in a missed or partial dose. It is important that you put your used needles and syringes in a special sharps container. Do not put them in a trash can. If you do not have a sharps container, call your pharmacist or healthcare provider to get one. Talk to your pediatrician regarding the use of this medicine in children. While this drug may be prescribed for selected conditions, precautions do apply. Overdosage: If you think you have taken too much of this medicine contact a poison control center or emergency room at once. NOTE: This medicine is only for you. Do not share this medicine with others. What if I miss a dose? It is important not to miss your dose. Call your doctor or health care professional if you miss your dose. If you miss a dose due to an On-body Injector failure or leakage, a new dose should be administered as soon as possible using a single prefilled syringe for manual use. What may interact with this medicine? Interactions have not been studied. Give your health care provider a list of all the medicines, herbs, non-prescription drugs, or dietary supplements you use. Also tell them if you smoke, drink alcohol, or use illegal drugs. Some items may interact  with your medicine. This list may not describe all possible interactions. Give your health care provider a list of all the medicines, herbs, non-prescription drugs, or dietary supplements you use. Also tell them if you smoke, drink alcohol, or use illegal drugs. Some items may interact with your medicine. What should I watch for while using this medicine? You may need blood work done while you are taking this medicine. If you are going to need a MRI, CT scan, or other procedure, tell your doctor that you are using this medicine (On-Body Injector only). What side effects may I notice from receiving this medicine? Side effects that you should report to your doctor or health care professional as soon as possible:  allergic reactions like skin rash, itching or hives, swelling of the face, lips, or tongue  back pain  dizziness  fever  pain, redness, or irritation at site where injected  pinpoint red spots on the skin  red or dark-brown urine  shortness of breath or breathing problems  stomach or side pain, or pain at the shoulder  swelling  tiredness  trouble passing urine or change in the amount of urine Side effects that usually do not require medical attention (report to your doctor or health care professional if they continue or are bothersome):  bone pain  muscle pain This list may not describe all possible side effects. Call your doctor for medical advice about side effects. You may report side effects to FDA at 1-800-FDA-1088. Where should I keep my medicine? Keep out of the reach of children. If you are using this medicine at home, you will be instructed on how to store it. Throw away any unused medicine after the expiration date on the label. NOTE: This sheet is a summary. It may not cover all possible information. If you have questions about this medicine, talk to your doctor, pharmacist, or health care provider.  2020 Elsevier/Gold Standard (2017-12-08  16:57:08)

## 2020-07-04 DIAGNOSIS — R69 Illness, unspecified: Secondary | ICD-10-CM | POA: Diagnosis not present

## 2020-07-04 DIAGNOSIS — F411 Generalized anxiety disorder: Secondary | ICD-10-CM | POA: Diagnosis not present

## 2020-07-04 DIAGNOSIS — F900 Attention-deficit hyperactivity disorder, predominantly inattentive type: Secondary | ICD-10-CM | POA: Diagnosis not present

## 2020-07-06 NOTE — Progress Notes (Signed)
DATE:  07/03/2020                                          X  CHEMO/IMMUNOTHERAPY REACTION            MD:  Dr. Sullivan Lone   AGENT/BLOOD [place]:                Ruxience      AGENT/BLOOD PRODUCT RECEIVING IMMEDIATELY PRIOR TO REACTION:    Ruxience   VS: BP:      134/86   P:        110       SPO2:        96% on room air  T: 98.3                BP:      128/82   P:        114       SPO2:        96% on room air     REACTION(S):         Abdominal pain, abdominal pressure, and flushing   PREMEDS:      Tylenol 650 mg p.o. x1, Benadryl 50 mg p.o. x1, and Pepcid 20 mg IV x1   INTERVENTION: Ruxience was paused and the patient was given Demerol 25 mg IV x1 followed by morphine sulfate 1 mg IV x2   Review of Systems  Review of Systems  Constitutional: Negative for activity change, appetite change, chills, diaphoresis and fever.       Flushing  Respiratory: Negative for cough, chest tightness and shortness of breath.   Cardiovascular: Negative for chest pain, palpitations and leg swelling.  Gastrointestinal: Positive for abdominal pain. Negative for abdominal distention, constipation, diarrhea, nausea and vomiting.       Abdominal pressure     Physical Exam  Physical Exam Constitutional:      General: He is not in acute distress.    Appearance: He is not diaphoretic.  HENT:     Head: Normocephalic and atraumatic.  Eyes:     General: No scleral icterus.       Right eye: No discharge.        Left eye: No discharge.     Conjunctiva/sclera: Conjunctivae normal.  Cardiovascular:     Rate and Rhythm: Regular rhythm. Tachycardia present.     Heart sounds: Normal heart sounds. No murmur heard.  No friction rub. No gallop.   Pulmonary:     Effort: Pulmonary effort is normal. No respiratory distress.     Breath sounds: Normal breath sounds. No wheezing or rales.  Skin:    General: Skin is warm and dry.     Findings: No erythema or rash.  Neurological:     Mental  Status: He is alert.  Psychiatric:        Mood and Affect: Mood normal.        Behavior: Behavior normal.        Thought Content: Thought content normal.        Judgment: Judgment normal.     OUTCOME:                 The patient's abdominal pain improved mildly after the above intervention.  After discussion with the patient it is likely that Ruxience was not the cause of the  patient's symptoms.  It is likely that the patient's abdominal pain and pressure are directly associated with his tumor.  Ruxience was restarted and the patient was able to complete his infusion without any additional issues of concern.    Sandi Mealy, MHS, PA-C

## 2020-07-10 ENCOUNTER — Telehealth: Payer: Self-pay | Admitting: *Deleted

## 2020-07-10 ENCOUNTER — Other Ambulatory Visit: Payer: Self-pay | Admitting: Medical

## 2020-07-10 MED ORDER — OXYCODONE HCL 5 MG PO TABS
5.0000 mg | ORAL_TABLET | ORAL | 0 refills | Status: DC | PRN
Start: 2020-07-10 — End: 2020-07-18

## 2020-07-10 NOTE — Telephone Encounter (Signed)
Received vm message from patient requesting a refill of his Oxycodone 5 mg tabs. Last refill was 06/21/20 for #30 tabs. Pt states he has 1 tablet left

## 2020-07-13 ENCOUNTER — Other Ambulatory Visit: Payer: Self-pay | Admitting: Hematology

## 2020-07-14 ENCOUNTER — Inpatient Hospital Stay: Payer: 59

## 2020-07-14 ENCOUNTER — Inpatient Hospital Stay (HOSPITAL_BASED_OUTPATIENT_CLINIC_OR_DEPARTMENT_OTHER): Payer: 59 | Admitting: Hematology

## 2020-07-14 ENCOUNTER — Other Ambulatory Visit: Payer: Self-pay | Admitting: *Deleted

## 2020-07-14 ENCOUNTER — Other Ambulatory Visit: Payer: Self-pay

## 2020-07-14 VITALS — BP 117/73 | HR 118 | Temp 97.2°F | Resp 18 | Ht 73.0 in | Wt 165.5 lb

## 2020-07-14 DIAGNOSIS — Z95828 Presence of other vascular implants and grafts: Secondary | ICD-10-CM | POA: Insufficient documentation

## 2020-07-14 DIAGNOSIS — C8248 Follicular lymphoma grade IIIb, lymph nodes of multiple sites: Secondary | ICD-10-CM

## 2020-07-14 DIAGNOSIS — Z5112 Encounter for antineoplastic immunotherapy: Secondary | ICD-10-CM | POA: Diagnosis not present

## 2020-07-14 LAB — URIC ACID: Uric Acid, Serum: 3.9 mg/dL (ref 3.7–8.6)

## 2020-07-14 LAB — CBC WITH DIFFERENTIAL (CANCER CENTER ONLY)
Abs Immature Granulocytes: 2.08 10*3/uL — ABNORMAL HIGH (ref 0.00–0.07)
Basophils Absolute: 0.2 10*3/uL — ABNORMAL HIGH (ref 0.0–0.1)
Basophils Relative: 1 %
Eosinophils Absolute: 0 10*3/uL (ref 0.0–0.5)
Eosinophils Relative: 0 %
HCT: 42.1 % (ref 39.0–52.0)
Hemoglobin: 13.8 g/dL (ref 13.0–17.0)
Immature Granulocytes: 12 %
Lymphocytes Relative: 3 %
Lymphs Abs: 0.4 10*3/uL — ABNORMAL LOW (ref 0.7–4.0)
MCH: 27.8 pg (ref 26.0–34.0)
MCHC: 32.8 g/dL (ref 30.0–36.0)
MCV: 84.9 fL (ref 80.0–100.0)
Monocytes Absolute: 0.5 10*3/uL (ref 0.1–1.0)
Monocytes Relative: 3 %
Neutro Abs: 13.8 10*3/uL — ABNORMAL HIGH (ref 1.7–7.7)
Neutrophils Relative %: 81 %
Platelet Count: 178 10*3/uL (ref 150–400)
RBC: 4.96 MIL/uL (ref 4.22–5.81)
RDW: 14.6 % (ref 11.5–15.5)
WBC Count: 17 10*3/uL — ABNORMAL HIGH (ref 4.0–10.5)
nRBC: 0 % (ref 0.0–0.2)

## 2020-07-14 LAB — CMP (CANCER CENTER ONLY)
ALT: 37 U/L (ref 0–44)
AST: 22 U/L (ref 15–41)
Albumin: 3.8 g/dL (ref 3.5–5.0)
Alkaline Phosphatase: 140 U/L — ABNORMAL HIGH (ref 38–126)
Anion gap: 9 (ref 5–15)
BUN: 17 mg/dL (ref 8–23)
CO2: 27 mmol/L (ref 22–32)
Calcium: 9.2 mg/dL (ref 8.9–10.3)
Chloride: 103 mmol/L (ref 98–111)
Creatinine: 0.87 mg/dL (ref 0.61–1.24)
GFR, Estimated: >60 mL/min (ref >60)
Glucose, Bld: 140 mg/dL — ABNORMAL HIGH (ref 70–99)
Potassium: 4.1 mmol/L (ref 3.5–5.1)
Sodium: 139 mmol/L (ref 135–145)
Total Bilirubin: 0.3 mg/dL (ref 0.3–1.2)
Total Protein: 5.9 g/dL — ABNORMAL LOW (ref 6.5–8.1)

## 2020-07-14 LAB — LACTATE DEHYDROGENASE: LDH: 259 U/L — ABNORMAL HIGH (ref 98–192)

## 2020-07-14 MED ORDER — SODIUM CHLORIDE 0.9% FLUSH
10.0000 mL | INTRAVENOUS | Status: DC | PRN
  Administered 2020-07-14: 10 mL
  Filled 2020-07-14: qty 10

## 2020-07-14 MED ORDER — HEPARIN SOD (PORK) LOCK FLUSH 100 UNIT/ML IV SOLN
500.0000 [IU] | Freq: Once | INTRAVENOUS | Status: AC | PRN
  Administered 2020-07-14: 500 [IU]
  Filled 2020-07-14: qty 5

## 2020-07-17 ENCOUNTER — Telehealth: Payer: Self-pay | Admitting: Hematology

## 2020-07-17 NOTE — Telephone Encounter (Signed)
Scheduled chemo per 110/28 sch msg - pt is aware of appt date and time

## 2020-07-18 ENCOUNTER — Other Ambulatory Visit: Payer: Self-pay | Admitting: *Deleted

## 2020-07-18 DIAGNOSIS — C8248 Follicular lymphoma grade IIIb, lymph nodes of multiple sites: Secondary | ICD-10-CM

## 2020-07-18 MED ORDER — OXYCODONE HCL 5 MG PO TABS: 5.0000 mg | ORAL_TABLET | ORAL | 0 refills | Status: DC | PRN

## 2020-07-19 ENCOUNTER — Telehealth: Payer: Self-pay | Admitting: *Deleted

## 2020-07-19 DIAGNOSIS — R69 Illness, unspecified: Secondary | ICD-10-CM | POA: Diagnosis not present

## 2020-07-19 DIAGNOSIS — F900 Attention-deficit hyperactivity disorder, predominantly inattentive type: Secondary | ICD-10-CM | POA: Diagnosis not present

## 2020-07-19 DIAGNOSIS — F411 Generalized anxiety disorder: Secondary | ICD-10-CM | POA: Diagnosis not present

## 2020-07-19 NOTE — Telephone Encounter (Signed)
Oxycodone refilled by Dr. [name] following patient's request. Patient notified.

## 2020-07-20 NOTE — Progress Notes (Signed)
HEMATOLOGY/ONCOLOGY CLINIC NOTE  Date of Service: 07/20/2020  Patient Care Team: [name], MD as PCP - General (Internal Medicine) Debara Pickett Nadean Corwin, MD as PCP - Cardiology (Cardiology) [name], MD (Internal Medicine)  CHIEF COMPLAINTS/PURPOSE OF CONSULTATION:  Probable widespread lymphoma - palpable mass in his abdomen.   HISTORY OF PRESENTING ILLNESS:   George Proctor is a wonderful 64 y.o. male who has been referred to [place] by Dr. Shelia Media for evaluation and management of possible wide-spread lymphoma - palpable mass in abdomen. Pt is accompanied today by his wife. The pt reports that he is doing well overall.   The pt reports that about three weeks ago he began experiencing abdominal fullness. Two weeks ago he began to feel abdominal pain. Lying down makes this discomfort worse. He has not had a restful sleep in two weeks. The abdominal fullness has caused lack of appetite, belching, and one episode of nausea and vomiting. He is currently eating 1/3-1/2 of his baseline. Pt has been taking Tramadol to control his pain, as well as a stool softener. His bowel movements look different, but he denies any constipation. He is hot natured and has mild night sweats, but denies drenching night sweats. Pt feels that he has been short of breath for awhile, but that this may have worsened lately. Pt follows with Dr. Benson Norway, GI.   He had abdominal surgery as a child after an accident. Pt has had GI bleeding and ulcers from taking NSAIDS. Pt has had several instances of abdominal swelling that were triggered by illness. Pt had esophageal dialation. His strictures were thought to be caused by acid reflux. He also has an umbilical hernia. He has chronic back pain. Pt was diagnosed with chronic lyme disease by a [place]. He has a history of balance issues and has brought this up to his PCP, who did not feel that it required a work up. He does not have a true allergy, but experiences  nausea and vomiting when taking Omeprazole. Pt denies any international travel in the last year.   Pt has been taking weekly Testosterone for 3-4 months. He was experiencing fatigue prior to beginning the hormonal injections. Pt was placed on Latuda & Lamictil several weeks ago for his Depression and Anxiety, but discontinued a week ago as he did not see any improvement in mood. He is not currently following with a mental health professional.   Dr. Shelia Media referred pt to Dr. Redmond Pulling at [facility] for biopsy consideration. Pt is scheduled to see Dr. Shelia Media next Wednesday.   Of note prior to the patient's visit today, pt has had  CT Abd/Pel completed on 06/05/2020 with results revealing numerous cardiophrenic, retroperitoneal and mesenteric adenopathy most consistent with lymphoma. Small volume of ascites. Hepatosplenomegaly.  Most recent lab results (03/22/2020) of CBC is as follows: all values are WNL except for WBC at 3.5K, PLT at 131K, Lymphs Rel at 17.6, Total Protein at 6.3.  On review of systems, pt reports worsening SOB, abdominal pain, abdominal fullness, low appetite, dyspepsia, mild night sweats and denies fevers, chills, rash, headaches, leg swelling, urinary habit changes, constipation, testicular pain/swelling and any other symptoms.   On PMHx the pt reports Depression, Anxiety, Abdominal Surgery, Gait difficulty, Esophageal dilation. On Family Hx the pt reports that his father and paternal aunt had polymyalgia rheumatica. His paternal aunt also had breast cancer.  INTERVAL HISTORY  Patient was recently admitted with increased abdominal distension. He received 1st Cycle of  r-CHOP urgently due symptomatic newly diagnosed high grade follicular lymphoma. Patient is here for a f/u of for toxicity check after 1st cycle of R-CHOP. He notes his abdominal distension is much improved and he is eating better. No significant nausea or vomiting. NO fevers/chills/night sweats. Some bone  pains from G-CSF shot. Labs reviewed with patient.  MEDICAL HISTORY:  Past Medical History:  Diagnosis Date  . Anxiety   . Depression   . Gait difficulty   . Lymphoma ([place])   . Memory loss     SURGICAL HISTORY: Past Surgical History:  Procedure Laterality Date  . ABDOMINAL SURGERY     Childhood  . BUNIONECTOMY    . IR IMAGING GUIDED PORT INSERTION  06/30/2020  . IR PARACENTESIS  06/26/2020  . IR US GUIDE BX ASP/DRAIN  06/26/2020    SOCIAL HISTORY: Social History   Socioeconomic History  . Marital status: Married    Spouse name: Not on file  . Number of children: 0  . Years of education: Veterinary surgeon  . Highest education level: Not on file  Occupational History  . Occupation: Scientist, research (physical sciences)  Tobacco Use  . Smoking status: Never Smoker  . Smokeless tobacco: Never Used  Vaping Use  . Vaping Use: Never used  Substance and Sexual Activity  . Alcohol use: No    Alcohol/week: 0.0 standard drinks    Comment: Less than one drink per week.  . Drug use: No  . Sexual activity: Not Currently  Other Topics Concern  . Not on file  Social History Narrative   Lives at home with wife.   Right-handed.   3 cups caffeine per day.   Social Determinants of Health   Financial Resource Strain:   . Difficulty of Paying Living Expenses: Not on file  Food Insecurity:   . Worried About Charity fundraiser in the Last Year: Not on file  . Ran Out of Food in the Last Year: Not on file  Transportation Needs:   . Lack of Transportation (Medical): Not on file  . Lack of Transportation (Non-Medical): Not on file  Physical Activity:   . Days of Exercise per Week: Not on file  . Minutes of Exercise per Session: Not on file  Stress:   . Feeling of Stress : Not on file  Social Connections:   . Frequency of Communication with Friends and Family: Not on file  . Frequency of Social Gatherings with Friends and Family: Not on file  . Attends Religious Services: Not on  file  . Active Member of Clubs or Organizations: Not on file  . Attends Archivist Meetings: Not on file  . Marital Status: Not on file  Intimate Partner Violence:   . Fear of Current or Ex-Partner: Not on file  . Emotionally Abused: Not on file  . Physically Abused: Not on file  . Sexually Abused: Not on file    FAMILY HISTORY: Family History  Problem Relation Age of Onset  . Aneurysm Mother 95       Thoracic  . Hypertension Father   . Heart failure Father   . Heart disease Father   . Heart attack Paternal Grandfather   . Depression Cousin   . Bipolar disorder Cousin   . Schizophrenia Maternal Uncle     ALLERGIES:  is allergic to omeprazole.  MEDICATIONS:  Current Outpatient Medications  Medication Sig Dispense Refill  . allopurinol (ZYLOPRIM) 100 MG tablet Take 1 tablet (100 mg total)  by mouth 2 (two) times daily. 60 tablet 2  . Ascorbic Acid (VITAMIN C) 1000 MG tablet Take 1,000 mg by mouth daily.    . B Complex Vitamins (VITAMIN B COMPLEX PO) Take 1 tablet by mouth daily.     Marland Kitchen lidocaine-prilocaine (EMLA) cream Apply to affected area once 30 g 3  . LORazepam (ATIVAN) 0.5 MG tablet Take 1 tablet (0.5 mg total) by mouth every 6 (six) hours as needed (Nausea or vomiting). 30 tablet 0  . Multiple Vitamin (MULTIVITAMIN) capsule Take 1 capsule by mouth daily.    . ondansetron (ZOFRAN) 8 MG tablet Take 1 tablet (8 mg total) by mouth every 8 (eight) hours as needed for vomiting or refractory nausea / vomiting. Start on day 3 after cyclophosphamide chemotherapy. 30 tablet 1  . oxyCODONE (OXY IR/ROXICODONE) 5 MG immediate release tablet Take 1 tablet (5 mg total) by mouth every 4 (four) hours as needed for severe pain. 30 tablet 0  . pantoprazole (PROTONIX) 40 MG tablet Take 40 mg by mouth 2 (two) times daily as needed (indigestion).     . polyethylene glycol (MIRALAX) 17 g packet Take 17 g by mouth daily. 30 each 1  . predniSONE (DELTASONE) 20 MG tablet Take 3 tablets (60  mg total) by mouth daily. Take with food 90 tablet 5  . prochlorperazine (COMPAZINE) 10 MG tablet Take 1 tablet (10 mg total) by mouth every 6 (six) hours as needed (Nausea or vomiting). 30 tablet 6  . senna-docusate (SENNA S) 8.6-50 MG tablet Take 2 tablets by mouth at bedtime. 60 tablet 1  . testosterone cypionate (DEPOTESTOSTERONE CYPIONATE) 200 MG/ML injection Inject into the muscle once a week.      No current facility-administered medications for this visit.    REVIEW OF SYSTEMS:    10 Point review of Systems was done is negative except as noted above.  PHYSICAL EXAMINATION: ECOG PERFORMANCE STATUS: 2 - Symptomatic, <50% confined to bed  . Vitals:   07/14/20 1308  BP: 117/73  Pulse: (!) 118  Resp: 18  Temp: (!) 97.2 F (36.2 C)  SpO2: 97%   Filed Weights   07/14/20 1308  Weight: 165 lb 8 oz (75.1 kg)   .Body mass index is 21.84 kg/m.  GENERAL:alert, in no acute distress and comfortable SKIN: no acute rashes, no significant lesions EYES: conjunctiva are pink and non-injected, sclera anicteric OROPHARYNX: MMM, no exudates, no oropharyngeal erythema or ulceration NECK: supple, no JVD LYMPH:  no palpable lymphadenopathy in the cervical and axillary regions. Several inguinal lymph nodes b/l. LUNGS: clear to auscultation b/l with normal respiratory effort HEART: regular rate & rhythm ABDOMEN:  normoactive bowel sounds , non tender, not distended. Extremity: no pedal edema PSYCH: alert & oriented x 3 with fluent speech NEURO: no focal motor/sensory deficits  LABORATORY DATA:  I have reviewed the data as listed  . CBC Latest Ref Rng & Units 07/14/2020 07/02/2020 07/01/2020  WBC 4.0 - 10.5 K/uL 17.0(H) 7.7 6.9  Hemoglobin 13.0 - 17.0 g/dL 13.8 12.6(L) 14.4  Hematocrit 39 - 52 % 42.1 37.9(L) 44.6  Platelets 150 - 400 K/uL 178 304 361    . CMP Latest Ref Rng & Units 07/14/2020 07/02/2020 07/01/2020  Glucose 70 - 99 mg/dL 140(H) 102(H) 96  BUN 8 - 23 mg/dL 17  28(H) 22  Creatinine 0.61 - 1.24 mg/dL 0.87 0.74 0.96  Sodium 135 - 145 mmol/L 139 135 138  Potassium 3.5 - 5.1 mmol/L 4.1 4.2 4.2  Chloride  98 - 111 mmol/L 103 101 101  CO2 22 - 32 mmol/L 27 26 25   Calcium 8.9 - 10.3 mg/dL 9.2 8.3(L) 9.0  Total Protein 6.5 - 8.1 g/dL 5.9(L) 5.0(L) 5.7(L)  Total Bilirubin 0.3 - 1.2 mg/dL 0.3 0.7 0.7  Alkaline Phos 38 - 126 U/L 140(H) 75 93  AST 15 - 41 U/L 22 54(H) 65(H)  ALT 0 - 44 U/L 37 20 26   . Lab Results  Component Value Date   LDH 259 (H) 07/14/2020   Ric acid 3.9  RADIOGRAPHIC STUDIES: I have personally reviewed the radiological images as listed and agreed with the findings in the report. CT Angio Chest PE W and/or Wo Contrast  Result Date: 06/24/2020 CLINICAL DATA:  64 year old male with recent diagnosis of lymphoma presenting with shortness of breath. Concern for pulmonary embolism. EXAM: CT ANGIOGRAPHY CHEST CT ABDOMEN AND PELVIS WITH CONTRAST TECHNIQUE: Multidetector CT imaging of the chest was performed using the standard protocol during bolus administration of intravenous contrast. Multiplanar CT image reconstructions and MIPs were obtained to evaluate the vascular anatomy. Multidetector CT imaging of the abdomen and pelvis was performed using the standard protocol during bolus administration of intravenous contrast. CONTRAST:  113mL OMNIPAQUE IOHEXOL 350 MG/ML SOLN COMPARISON:  Chest radiograph dated 06/24/2020. FINDINGS: Evaluation of this exam is limited due to respiratory motion artifact. CTA CHEST FINDINGS Cardiovascular: There is no cardiomegaly or pericardial effusion. The thoracic aorta is unremarkable. Evaluation of the pulmonary arteries is limited due to respiratory motion artifact and suboptimal opacification and timing of the contrast. No large or central pulmonary artery embolus identified. Mediastinum/Nodes: There is no hilar adenopathy. Enlarged lymph nodes noted anterior to the heart and in the anterior cardiophrenic space. The  esophagus and the thyroid gland are grossly unremarkable. No mediastinal fluid collection. Lungs/Pleura: Small to moderate bilateral pleural effusions with associated partial compressive atelectasis the lower lobes. Pneumonia is not excluded clinical correlation is recommended. There is no pneumothorax. The central airways are patent. Musculoskeletal: No chest wall abnormality. No acute or significant osseous findings. Review of the MIP images confirms the above findings. CT ABDOMEN and PELVIS FINDINGS No intra-abdominal free air. There is moderate ascites. Hepatobiliary: The liver is unremarkable. No intrahepatic biliary ductal dilatation. The gallbladder is unremarkable. Pancreas: The pancreas is suboptimally visualized but grossly unremarkable. Spleen: Normal in size without focal abnormality. Adrenals/Urinary Tract: The adrenal glands unremarkable. There is mild left hydronephrosis. There is delayed excretion of contrast by left kidney. The right kidney is unremarkable. The urinary bladder is grossly unremarkable. Stomach/Bowel: There is moderate stool throughout the colon. There is no bowel obstruction. The appendix is not visualized with certainty. No inflammatory changes identified in the right lower quadrant. Vascular/Lymphatic: Mild aortoiliac atherosclerotic disease. The IVC is grossly unremarkable. No portal venous gas. Extensive retroperitoneal adenopathy encasing the abdominal aorta and iliac arteries. There is diffuse mesenteric and omental implants and caking. Large soft tissue mass in the mesentery encasing the SMA measures approximately 11 x 17 cm in axial dimensions and 15 cm in craniocaudal length. Reproductive: The prostate is grossly unremarkable. Other: Diffuse subcutaneous edema. Small fat containing umbilical hernia. Musculoskeletal: No acute or significant osseous findings. Review of the MIP images confirms the above findings. IMPRESSION: 1. No CT evidence of central pulmonary artery  embolus. 2. Small to moderate bilateral pleural effusions with associated partial compressive atelectasis of the lower lobes. Pneumonia is not excluded clinical correlation is recommended. 3. Extensive retroperitoneal, mesenteric, and omental adenopathy in keeping with history  of lymphoma. Enlarged lymph nodes in the anterior cardiophrenic space. 4. Moderate ascites. 5. Mild left hydronephrosis. 6. Aortic Atherosclerosis (ICD10-I70.0). Electronically Signed   By: Anner Crete M.D.   On: 06/24/2020 16:02   CT ABDOMEN PELVIS W CONTRAST  Result Date: 06/24/2020 CLINICAL DATA:  64 year old male with recent diagnosis of lymphoma presenting with shortness of breath. Concern for pulmonary embolism. EXAM: CT ANGIOGRAPHY CHEST CT ABDOMEN AND PELVIS WITH CONTRAST TECHNIQUE: Multidetector CT imaging of the chest was performed using the standard protocol during bolus administration of intravenous contrast. Multiplanar CT image reconstructions and MIPs were obtained to evaluate the vascular anatomy. Multidetector CT imaging of the abdomen and pelvis was performed using the standard protocol during bolus administration of intravenous contrast. CONTRAST:  156mL OMNIPAQUE IOHEXOL 350 MG/ML SOLN COMPARISON:  Chest radiograph dated 06/24/2020. FINDINGS: Evaluation of this exam is limited due to respiratory motion artifact. CTA CHEST FINDINGS Cardiovascular: There is no cardiomegaly or pericardial effusion. The thoracic aorta is unremarkable. Evaluation of the pulmonary arteries is limited due to respiratory motion artifact and suboptimal opacification and timing of the contrast. No large or central pulmonary artery embolus identified. Mediastinum/Nodes: There is no hilar adenopathy. Enlarged lymph nodes noted anterior to the heart and in the anterior cardiophrenic space. The esophagus and the thyroid gland are grossly unremarkable. No mediastinal fluid collection. Lungs/Pleura: Small to moderate bilateral pleural effusions  with associated partial compressive atelectasis the lower lobes. Pneumonia is not excluded clinical correlation is recommended. There is no pneumothorax. The central airways are patent. Musculoskeletal: No chest wall abnormality. No acute or significant osseous findings. Review of the MIP images confirms the above findings. CT ABDOMEN and PELVIS FINDINGS No intra-abdominal free air. There is moderate ascites. Hepatobiliary: The liver is unremarkable. No intrahepatic biliary ductal dilatation. The gallbladder is unremarkable. Pancreas: The pancreas is suboptimally visualized but grossly unremarkable. Spleen: Normal in size without focal abnormality. Adrenals/Urinary Tract: The adrenal glands unremarkable. There is mild left hydronephrosis. There is delayed excretion of contrast by left kidney. The right kidney is unremarkable. The urinary bladder is grossly unremarkable. Stomach/Bowel: There is moderate stool throughout the colon. There is no bowel obstruction. The appendix is not visualized with certainty. No inflammatory changes identified in the right lower quadrant. Vascular/Lymphatic: Mild aortoiliac atherosclerotic disease. The IVC is grossly unremarkable. No portal venous gas. Extensive retroperitoneal adenopathy encasing the abdominal aorta and iliac arteries. There is diffuse mesenteric and omental implants and caking. Large soft tissue mass in the mesentery encasing the SMA measures approximately 11 x 17 cm in axial dimensions and 15 cm in craniocaudal length. Reproductive: The prostate is grossly unremarkable. Other: Diffuse subcutaneous edema. Small fat containing umbilical hernia. Musculoskeletal: No acute or significant osseous findings. Review of the MIP images confirms the above findings. IMPRESSION: 1. No CT evidence of central pulmonary artery embolus. 2. Small to moderate bilateral pleural effusions with associated partial compressive atelectasis of the lower lobes. Pneumonia is not excluded  clinical correlation is recommended. 3. Extensive retroperitoneal, mesenteric, and omental adenopathy in keeping with history of lymphoma. Enlarged lymph nodes in the anterior cardiophrenic space. 4. Moderate ascites. 5. Mild left hydronephrosis. 6. Aortic Atherosclerosis (ICD10-I70.0). Electronically Signed   By: Anner Crete M.D.   On: 06/24/2020 16:02   NM PET Image Initial (PI) Skull Base To Thigh  Result Date: 06/21/2020 CLINICAL DATA:  Initial treatment strategy for non-Hodgkin lymphoma. EXAM: NUCLEAR MEDICINE PET SKULL BASE TO THIGH TECHNIQUE: 9.5 mCi F-18 FDG was injected intravenously. Full-ring PET imaging was  performed from the skull base to thigh after the radiotracer. CT data was obtained and used for attenuation correction and anatomic localization. Fasting blood glucose: 90 mg/dl COMPARISON:  CT abdomen pelvis 06/05/2020, coronary CT 01/28/2020 and CT chest 11/17/2017. FINDINGS: Mediastinal blood pool activity: SUV max 0.8 Liver activity: SUV max 3.2 NECK: Mild hypermetabolic lymph nodes in the neck are seen with an index left level 2 lymph node measuring approximately 6 mm (4/27) and fall SUV max of 4.0. Incidental CT findings: None. CHEST: Hypermetabolic low internal jugular mediastinal, internal mammary and axillary lymph nodes. Index low left paratracheal lymph measures 8 mm (4/64) with an SUV max of 6.8. Clustered prepericardiac lymph nodes measure up to 12 mm (4/90) with an SUV max of 10.0. No hypermetabolic pulmonary nodules. Incidental CT findings: Coronary artery calcification. Heart is mildly enlarged. No pericardial effusion. Moderate bilateral pleural effusions with compressive atelectasis in both lower lobes. ABDOMEN/PELVIS: Hypermetabolic adenopathy is seen throughout the abdominal peritoneal ligaments, retroperitoneum and mesenteries. Conglomerate nodal mesenteric mass is difficult to differentiate from small bowel, which may be involved, with an SUV max of 14.4, measuring  approximately 8.6 x 12.6 cm (4/137). Index gastrohepatic ligament lymph node measures approximately 2.3 cm (4/110) with an SUV max of 11.4. Extensive hypermetabolic peritoneal/omental thickening/caking, with an index 1.9 x 3.6 cm left omental nodule (4/121), SUV max 11.8. Inguinal lymph nodes measure up to 1.5 cm on the left (4/202) with an SUV max of 1.7. No abnormal hypermetabolism in the liver, adrenal glands, spleen or pancreas. Incidental CT findings: Visualized portion of the liver is grossly unremarkable. There may be sludge in the gallbladder. Adrenal glands and right kidney are grossly unremarkable. There may be a punctate stone in the lower pole left kidney. Left pelvicaliceal fullness versus mild hydronephrosis. Spleen is grossly unremarkable. Bladder appears thick-walled but is under distended. Ascites. SKELETON: No abnormal osseous hypermetabolism. Incidental CT findings: Degenerative changes in the spine. IMPRESSION: 1. Hypermetabolic adenopathy throughout the neck, chest, abdomen and pelvis. Findings are worst in the abdomen, with extensive peritoneal carcinomatosis. Small bowel involvement is not excluded. Findings are consistent with the provided history of lymphoma, [place] 5. 2. Moderate bilateral pleural effusions with compressive atelectasis in both lower lobes. 3. Left pelvicaliceal fullness versus mild hydronephrosis. 4. Question punctate left renal stone. Electronically Signed   By: Lorin Picket M.D.   On: 06/21/2020 09:04   US Abdomen Limited  Result Date: 06/29/2020 CLINICAL DATA:  Question ascites. EXAM: LIMITED ABDOMEN ULTRASOUND FOR ASCITES TECHNIQUE: Limited ultrasound survey for ascites was performed in all four abdominal quadrants. COMPARISON:  CT 06/24/2020 FINDINGS: Four-quadrant scanning shows a small amount of ascites without any accessible collection. This appears considerably diminished in comparison to the CT of 5 days ago. IMPRESSION: Small amount of ascites. Amount  appears considerably reduced compared with a CT scan of 5 days ago. Electronically Signed   By: Nelson Chimes M.D.   On: 06/29/2020 15:24   IR US Guide Bx Asp/Drain  Result Date: 06/26/2020 INDICATION: Concern for lymphoma. Please perform ultrasound-guided biopsy of hypermetabolic inguinal lymph node for tissue diagnostic purposes Additionally, patient currently admitted with abdominal pain and distension and as such request made for ascites search ultrasound ultrasound-guided paracentesis for diagnostic and therapeutic purposes as indicated. EXAM: 1. ULTRASOUND-GUIDED RIGHT INGUINAL LYMPH NODE BIOPSY 2. ULTRASOUND-GUIDED PARACENTESIS COMPARISON:  CT of the chest, abdomen and pelvis- 06/24/2020; PET-CT-06/21/2020 MEDICATIONS: None. COMPLICATIONS: None immediate. TECHNIQUE: Informed written consent was obtained from the patient after a discussion of the  risks, benefits and alternatives to treatment. A timeout was performed prior to the initiation of the procedure. Initial ultrasound scanning demonstrates a small amount of intra-abdominal ascites with dominant pocket located within right upper abdomen adjacent to the hepatic parenchyma. As such, the skin overlying the right upper abdomen was subsequently prepped and draped in the usual sterile fashion. 1% lidocaine with epinephrine was used for local anesthesia. Under direct ultrasound guidance, an 8 Fr Safe-T-Centesis catheter was introduced. The paracentesis was performed. A small amount of aspirated fluid was capped and sent to the laboratory for cytologic analysis as well as triglyceride levels. The catheter was removed and a dressing was applied. Next, attention was paid towards ultrasound-guided biopsy of known hypermetabolic right inguinal lymphadenopathy. Sonographic evaluation of the right groin demonstrates an at least 2.4 x 0.8 cm right inguinal lymph node correlating with the dominant right inguinal lymph node seen on preceding abdominal CT image 90,  series 2. The procedure was planned. The right groin was prepped and draped in usual sterile fashion. After the overlying soft tissues anesthetized 1% lidocaine with epinephrine, an 18 gauge core needle biopsy device was utilized to obtain 6 core needle biopsy samples. Multiple ultrasound images were saved procedural documentation purposes. Samples were placed in saline and submitted to the laboratory for pathologic analysis. The patient tolerated the above procedures well without immediate postprocedural complication. FINDINGS: A total of approximately 2 liters of chylous fluid was removed. Samples were sent to the laboratory as requested by the clinical team. Successful ultrasound-guided biopsy dominant right inguinal lymph node IMPRESSION: 1. Successful ultrasound-guided biopsy of dominant right inguinal node. 2. Successful ultrasound-guided paracentesis yielding 2 liters of chylous appearing fluid. Fluid was sent to the laboratory for cytologic analysis as well as triglyceride levels. Electronically Signed   By: Sandi Mariscal M.D.   On: 06/26/2020 13:16   DG Chest Port 1 View  Result Date: 06/24/2020 CLINICAL DATA:  Abdominal pain and swelling.  History of lymphoma. EXAM: PORTABLE CHEST 1 VIEW COMPARISON:  Chest radiograph 11/17/2017 FINDINGS: Normal cardiac and mediastinal contours. Small bilateral pleural effusions with underlying consolidation. No pleural effusion or pneumothorax. Osseous structures unremarkable. IMPRESSION: Small bilateral pleural effusions with underlying consolidation. Electronically Signed   By: Lovey Newcomer M.D.   On: 06/24/2020 14:11   ECHOCARDIOGRAM COMPLETE  Result Date: 06/25/2020    ECHOCARDIOGRAM REPORT   Patient Name:   George Proctor Date of Exam: 06/25/2020 Medical Rec #:  [id number]      Height:       73.0 in Accession #:    [id number]     Weight:       189.0 lb Date of Birth:  1956-02-07      BSA:          2.101 m Patient Age:    55 years       BP:           136/92 mmHg  Patient Gender: M              HR:           120 bpm. Exam Location:  Inpatient Procedure: 2D Echo Indications:    786.09 dyspnea  History:        Patient has prior history of Echocardiogram examinations, most                 recent 08/11/2019. Lymphoma.  Sonographer:    Jannett Celestine RDCS (AE) Referring Phys: [phone] [facility]  Sonographer Comments: Suboptimal  parasternal window and suboptimal subcostal window. IMPRESSIONS  1. Left ventricular ejection fraction, by estimation, is 65 to 70%. The left ventricle has normal function. The left ventricle has no regional wall motion abnormalities. Left ventricular diastolic parameters are consistent with Grade I diastolic dysfunction (impaired relaxation).  2. Right ventricular systolic function is hyperdynamic. The right ventricular size is normal.  3. The mitral valve is normal in structure. No evidence of mitral valve regurgitation. No evidence of mitral stenosis.  4. The aortic valve is normal in structure. Aortic valve regurgitation is not visualized. No aortic stenosis is present. FINDINGS  Left Ventricle: Left ventricular ejection fraction, by estimation, is 65 to 70%. The left ventricle has normal function. The left ventricle has no regional wall motion abnormalities. The left ventricular internal cavity size was normal in size. There is  no left ventricular hypertrophy. Left ventricular diastolic parameters are consistent with Grade I diastolic dysfunction (impaired relaxation). Right Ventricle: The right ventricular size is normal. No increase in right ventricular wall thickness. Right ventricular systolic function is hyperdynamic. Left Atrium: Left atrial size was normal in size. Right Atrium: Right atrial size was normal in size. Pericardium: There is no evidence of pericardial effusion. Mitral Valve: The mitral valve is normal in structure. No evidence of mitral valve regurgitation. No evidence of mitral valve stenosis. Tricuspid Valve: The tricuspid  valve is normal in structure. Tricuspid valve regurgitation is trivial. No evidence of tricuspid stenosis. Aortic Valve: The aortic valve is normal in structure. Aortic valve regurgitation is not visualized. No aortic stenosis is present. Pulmonic Valve: The pulmonic valve was normal in structure. Pulmonic valve regurgitation is not visualized. No evidence of pulmonic stenosis. Aorta: The aortic root is normal in size and structure. Venous: The inferior vena cava was not well visualized. IAS/Shunts: No atrial level shunt detected by color flow Doppler.  LEFT VENTRICLE PLAX 2D LVOT diam:     2.50 cm LV SV:         72 LV SV Index:   34 LVOT Area:     4.91 cm  RIGHT VENTRICLE RV S prime:     18.00 cm/s TAPSE (M-mode): 1.8 cm LEFT ATRIUM             Index       RIGHT ATRIUM           Index LA Vol (A2C):   60.4 ml 28.75 ml/m RA Area:     15.80 cm LA Vol (A4C):   42.5 ml 20.23 ml/m RA Volume:   32.40 ml  15.42 ml/m LA Biplane Vol: 54.0 ml 25.71 ml/m  AORTIC VALVE LVOT Vmax:   95.30 cm/s LVOT Vmean:  74.500 cm/s LVOT VTI:    0.146 m MITRAL VALVE               TRICUSPID VALVE MV Area (PHT): 3.31 cm    TR Peak grad:   13.7 mmHg MV Decel Time: 229 msec    TR Vmax:        185.00 cm/s MV E velocity: 69.40 cm/s MV A velocity: 87.00 cm/s  SHUNTS MV E/A ratio:  0.80        Systemic VTI:  0.15 m                            Systemic Diam: 2.50 cm Skeet Latch MD Electronically signed by Skeet Latch MD Signature Date/Time: 06/25/2020/4:06:39 PM    Final  IR IMAGING GUIDED PORT INSERTION  Result Date: 06/30/2020 CLINICAL DATA:  High-grade follicular lymphoma and need for porta cath to begin chemotherapy. EXAM: IMPLANTED PORT A CATH PLACEMENT WITH ULTRASOUND AND FLUOROSCOPIC GUIDANCE ANESTHESIA/SEDATION: 4.0 mg IV Versed; 100 mcg IV Fentanyl Total Moderate Sedation Time:  32 minutes The patient's level of consciousness and physiologic status were continuously monitored during the procedure by Radiology nursing.  Additional Medications: 2 g IV Ancef. FLUOROSCOPY TIME:  24 seconds.  9.0 mGy. PROCEDURE: The procedure, risks, benefits, and alternatives were explained to the patient. Questions regarding the procedure were encouraged and answered. The patient understands and consents to the procedure. A time-out was performed prior to initiating the procedure. Ultrasound was utilized to confirm patency of the right internal jugular vein. The right neck and chest were prepped with chlorhexidine in a sterile fashion, and a sterile drape was applied covering the operative field. Maximum barrier sterile technique with sterile gowns and gloves were used for the procedure. Local anesthesia was provided with 1% lidocaine. After creating a small venotomy incision, a 21 gauge needle was advanced into the right internal jugular vein under direct, real-time ultrasound guidance. Ultrasound image documentation was performed. After securing guidewire access, an 8 Fr dilator was placed. A J-wire was kinked to measure appropriate catheter length. A subcutaneous port pocket was then created along the upper chest wall utilizing sharp and blunt dissection. Portable cautery was utilized. The pocket was irrigated with sterile saline. A single lumen power injectable port was chosen for placement. The 8 Fr catheter was tunneled from the port pocket site to the venotomy incision. The port was placed in the pocket. External catheter was trimmed to appropriate length based on guidewire measurement. At the venotomy, an 8 Fr peel-away sheath was placed over a guidewire. The catheter was then placed through the sheath and the sheath removed. Final catheter positioning was confirmed and documented with a fluoroscopic spot image. The port was accessed with a needle and aspirated and flushed with heparinized saline. The access needle was removed. The venotomy and port pocket incisions were closed with subcutaneous 3-0 Monocryl and subcuticular 4-0 Vicryl.  Dermabond was applied to both incisions. COMPLICATIONS: COMPLICATIONS None FINDINGS: After catheter placement, the tip lies at the cavo-atrial junction. The catheter aspirates normally and is ready for immediate use. IMPRESSION: Placement of single lumen port a cath via right internal jugular vein. The catheter tip lies at the cavo-atrial junction. A power injectable port a cath was placed and is ready for immediate use. Electronically Signed   By: Aletta Edouard M.D.   On: 06/30/2020 16:58   VAS [place] LOWER EXTREMITY VENOUS (DVT)  Result Date: 06/29/2020  Lower Venous DVTStudy Indications: Edema.  Risk Factors: Underlying diagnosis of Non-Hodgkin's lymphoma. Limitations: Poor ultrasound/tissue interface. Performing Technologist: Maudry Mayhew MHA, RDMS, RVT, RDCS  Examination Guidelines: A complete evaluation includes B-mode imaging, spectral Doppler, color Doppler, and power Doppler as needed of all accessible portions of each vessel. Bilateral testing is considered an integral part of a complete examination. Limited examinations for reoccurring indications may be performed as noted. The reflux portion of the exam is performed with the patient in reverse Trendelenburg.  +---------+---------------+---------+-----------+----------+--------------+ RIGHT    CompressibilityPhasicitySpontaneityPropertiesThrombus Aging +---------+---------------+---------+-----------+----------+--------------+ CFV      Full           Yes      Yes                                 +---------+---------------+---------+-----------+----------+--------------+  SFJ      Full                                                        +---------+---------------+---------+-----------+----------+--------------+ FV Prox  Full                                                        +---------+---------------+---------+-----------+----------+--------------+ FV Mid   Full                                                         +---------+---------------+---------+-----------+----------+--------------+ FV DistalFull                                                        +---------+---------------+---------+-----------+----------+--------------+ PFV      Full                                                        +---------+---------------+---------+-----------+----------+--------------+ POP      Full           Yes      Yes                                 +---------+---------------+---------+-----------+----------+--------------+ PTV      Full                                                        +---------+---------------+---------+-----------+----------+--------------+ PERO     Full                                                        +---------+---------------+---------+-----------+----------+--------------+   +----+---------------+---------+-----------+----------+--------------+ LEFTCompressibilityPhasicitySpontaneityPropertiesThrombus Aging +----+---------------+---------+-----------+----------+--------------+ CFV Full           Yes      Yes                                 +----+---------------+---------+-----------+----------+--------------+     Summary: RIGHT: - There is no evidence of deep vein thrombosis in the lower extremity.  - No cystic structure found in the popliteal fossa. - Ultrasound characteristics of enlarged lymph nodes are noted in the groin.  LEFT: - No evidence of common femoral vein obstruction. - Ultrasound characteristics  of enlarged lymph nodes noted in the groin.  *See table(s) above for measurements and observations. Electronically signed by Servando Snare MD on 06/29/2020 at 5:37:15 PM.    Final    IR Paracentesis  Result Date: 06/26/2020 INDICATION: Concern for lymphoma. Please perform ultrasound-guided biopsy of hypermetabolic inguinal lymph node for tissue diagnostic purposes Additionally, patient currently admitted with abdominal pain and  distension and as such request made for ascites search ultrasound ultrasound-guided paracentesis for diagnostic and therapeutic purposes as indicated. EXAM: 1. ULTRASOUND-GUIDED RIGHT INGUINAL LYMPH NODE BIOPSY 2. ULTRASOUND-GUIDED PARACENTESIS COMPARISON:  CT of the chest, abdomen and pelvis- 06/24/2020; PET-CT-06/21/2020 MEDICATIONS: None. COMPLICATIONS: None immediate. TECHNIQUE: Informed written consent was obtained from the patient after a discussion of the risks, benefits and alternatives to treatment. A timeout was performed prior to the initiation of the procedure. Initial ultrasound scanning demonstrates a small amount of intra-abdominal ascites with dominant pocket located within right upper abdomen adjacent to the hepatic parenchyma. As such, the skin overlying the right upper abdomen was subsequently prepped and draped in the usual sterile fashion. 1% lidocaine with epinephrine was used for local anesthesia. Under direct ultrasound guidance, an 8 Fr Safe-T-Centesis catheter was introduced. The paracentesis was performed. A small amount of aspirated fluid was capped and sent to the laboratory for cytologic analysis as well as triglyceride levels. The catheter was removed and a dressing was applied. Next, attention was paid towards ultrasound-guided biopsy of known hypermetabolic right inguinal lymphadenopathy. Sonographic evaluation of the right groin demonstrates an at least 2.4 x 0.8 cm right inguinal lymph node correlating with the dominant right inguinal lymph node seen on preceding abdominal CT image 90, series 2. The procedure was planned. The right groin was prepped and draped in usual sterile fashion. After the overlying soft tissues anesthetized 1% lidocaine with epinephrine, an 18 gauge core needle biopsy device was utilized to obtain 6 core needle biopsy samples. Multiple ultrasound images were saved procedural documentation purposes. Samples were placed in saline and submitted to the  laboratory for pathologic analysis. The patient tolerated the above procedures well without immediate postprocedural complication. FINDINGS: A total of approximately 2 liters of chylous fluid was removed. Samples were sent to the laboratory as requested by the clinical team. Successful ultrasound-guided biopsy dominant right inguinal lymph node IMPRESSION: 1. Successful ultrasound-guided biopsy of dominant right inguinal node. 2. Successful ultrasound-guided paracentesis yielding 2 liters of chylous appearing fluid. Fluid was sent to the laboratory for cytologic analysis as well as triglyceride levels. Electronically Signed   By: Sandi Mariscal M.D.   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 PLAN: -Discussed patient's most recent labs from today - stable -redisucssed PET/CT and pathology results and answered patients additional questions regarding this - no evidence of uncontrolled TLS - no prohibitive toxicities from 1st cycle of R-CHOP -continue allopurinol through C2 -Plz schedule C2 and C3 of R-CHOP with portflush and labs, MD visits and D3 -udenyca   FOLLOW UP: Plz schedule C2 and C3 of R-CHOP with portflush and labs, MD visits and D3 -udenyca   All of the patients questions were answered with apparent satisfaction. The patient knows to call the clinic with any problems, questions or concerns. . The total time spent in the appointment was 30 minutes and more than 50% was on counseling  and direct patient cares.      Sullivan Lone MD [place] AAHIVMS [facility] [place] Hematology/Oncology Physician [facility]  (Office):       [phone] (Work cell):  [phone] (Fax):           [phone]  07/20/2020 11:32 PM  I, Yevette Edwards, am acting as a scribe for Dr. Sullivan Lone.   .I have reviewed the above  documentation for accuracy and completeness, and I agree with the above. Brunetta Genera MD

## 2020-07-21 ENCOUNTER — Ambulatory Visit (HOSPITAL_BASED_OUTPATIENT_CLINIC_OR_DEPARTMENT_OTHER): Payer: 59 | Admitting: Medical

## 2020-07-21 ENCOUNTER — Inpatient Hospital Stay: Payer: 59 | Attending: Hematology

## 2020-07-21 ENCOUNTER — Inpatient Hospital Stay (HOSPITAL_BASED_OUTPATIENT_CLINIC_OR_DEPARTMENT_OTHER): Payer: 59 | Admitting: Hematology

## 2020-07-21 ENCOUNTER — Other Ambulatory Visit: Payer: Self-pay

## 2020-07-21 ENCOUNTER — Inpatient Hospital Stay: Payer: 59

## 2020-07-21 ENCOUNTER — Other Ambulatory Visit: Payer: Self-pay | Admitting: *Deleted

## 2020-07-21 VITALS — BP 111/72 | HR 96 | Temp 98.5°F | Resp 18

## 2020-07-21 VITALS — BP 113/81 | HR 97 | Temp 96.0°F | Resp 18 | Ht 73.0 in | Wt 163.5 lb

## 2020-07-21 DIAGNOSIS — Z7189 Other specified counseling: Secondary | ICD-10-CM

## 2020-07-21 DIAGNOSIS — Z5111 Encounter for antineoplastic chemotherapy: Secondary | ICD-10-CM

## 2020-07-21 DIAGNOSIS — Z5189 Encounter for other specified aftercare: Secondary | ICD-10-CM | POA: Insufficient documentation

## 2020-07-21 DIAGNOSIS — Z452 Encounter for adjustment and management of vascular access device: Secondary | ICD-10-CM | POA: Insufficient documentation

## 2020-07-21 DIAGNOSIS — C8248 Follicular lymphoma grade IIIb, lymph nodes of multiple sites: Secondary | ICD-10-CM

## 2020-07-21 DIAGNOSIS — Z5112 Encounter for antineoplastic immunotherapy: Secondary | ICD-10-CM | POA: Diagnosis present

## 2020-07-21 DIAGNOSIS — T8090XA Unspecified complication following infusion and therapeutic injection, initial encounter: Secondary | ICD-10-CM

## 2020-07-21 LAB — CMP (CANCER CENTER ONLY)
ALT: 21 U/L (ref 0–44)
AST: 15 U/L (ref 15–41)
Albumin: 3.8 g/dL (ref 3.5–5.0)
Alkaline Phosphatase: 93 U/L (ref 38–126)
Anion gap: 5 (ref 5–15)
BUN: 16 mg/dL (ref 8–23)
CO2: 29 mmol/L (ref 22–32)
Calcium: 9 mg/dL (ref 8.9–10.3)
Chloride: 104 mmol/L (ref 98–111)
Creatinine: 0.8 mg/dL (ref 0.61–1.24)
GFR, Estimated: >60 mL/min (ref >60)
Glucose, Bld: 100 mg/dL — ABNORMAL HIGH (ref 70–99)
Potassium: 4.5 mmol/L (ref 3.5–5.1)
Sodium: 138 mmol/L (ref 135–145)
Total Bilirubin: 0.6 mg/dL (ref 0.3–1.2)
Total Protein: 5.7 g/dL — ABNORMAL LOW (ref 6.5–8.1)

## 2020-07-21 LAB — CBC WITH DIFFERENTIAL (CANCER CENTER ONLY)
Abs Immature Granulocytes: 0.69 10*3/uL — ABNORMAL HIGH (ref 0.00–0.07)
Basophils Absolute: 0.2 10*3/uL — ABNORMAL HIGH (ref 0.0–0.1)
Basophils Relative: 1 %
Eosinophils Absolute: 0 10*3/uL (ref 0.0–0.5)
Eosinophils Relative: 0 %
HCT: 42.4 % (ref 39.0–52.0)
Hemoglobin: 13.8 g/dL (ref 13.0–17.0)
Immature Granulocytes: 5 %
Lymphocytes Relative: 4 %
Lymphs Abs: 0.6 10*3/uL — ABNORMAL LOW (ref 0.7–4.0)
MCH: 27.4 pg (ref 26.0–34.0)
MCHC: 32.5 g/dL (ref 30.0–36.0)
MCV: 84.1 fL (ref 80.0–100.0)
Monocytes Absolute: 0.7 10*3/uL (ref 0.1–1.0)
Monocytes Relative: 5 %
Neutro Abs: 12.4 10*3/uL — ABNORMAL HIGH (ref 1.7–7.7)
Neutrophils Relative %: 85 %
Platelet Count: 243 10*3/uL (ref 150–400)
RBC: 5.04 MIL/uL (ref 4.22–5.81)
RDW: 15.3 % (ref 11.5–15.5)
WBC Count: 14.4 10*3/uL — ABNORMAL HIGH (ref 4.0–10.5)
nRBC: 0 % (ref 0.0–0.2)

## 2020-07-21 LAB — URIC ACID: Uric Acid, Serum: 3.6 mg/dL — ABNORMAL LOW (ref 3.7–8.6)

## 2020-07-21 LAB — LACTATE DEHYDROGENASE: LDH: 177 U/L (ref 98–192)

## 2020-07-21 MED ORDER — ACETAMINOPHEN 325 MG PO TABS
ORAL_TABLET | ORAL | Status: AC
  Filled 2020-07-21: qty 2

## 2020-07-21 MED ORDER — DIPHENHYDRAMINE HCL 25 MG PO CAPS
ORAL_CAPSULE | ORAL | Status: AC
  Filled 2020-07-21: qty 2

## 2020-07-21 MED ORDER — PALONOSETRON HCL INJECTION 0.25 MG/5ML
0.2500 mg | Freq: Once | INTRAVENOUS | Status: AC
  Administered 2020-07-21: 0.25 mg via INTRAVENOUS

## 2020-07-21 MED ORDER — PREDNISONE 20 MG PO TABS: ORAL_TABLET | ORAL | 3 refills | Status: DC

## 2020-07-21 MED ORDER — SODIUM CHLORIDE 0.9% FLUSH
10.0000 mL | INTRAVENOUS | Status: DC | PRN
  Administered 2020-07-21: 10 mL
  Filled 2020-07-21: qty 10

## 2020-07-21 MED ORDER — DIPHENHYDRAMINE HCL 25 MG PO CAPS
50.0000 mg | ORAL_CAPSULE | Freq: Once | ORAL | Status: AC
  Administered 2020-07-21: 50 mg via ORAL

## 2020-07-21 MED ORDER — ACETAMINOPHEN 325 MG PO TABS
ORAL_TABLET | ORAL | Status: AC
  Filled 2020-07-21: qty 1

## 2020-07-21 MED ORDER — DOXORUBICIN HCL CHEMO IV INJECTION 2 MG/ML
50.0000 mg/m2 | Freq: Once | INTRAVENOUS | Status: AC
  Administered 2020-07-21: 106 mg via INTRAVENOUS
  Filled 2020-07-21: qty 53

## 2020-07-21 MED ORDER — SODIUM CHLORIDE 0.9 % IV SOLN
150.0000 mg | Freq: Once | INTRAVENOUS | Status: AC
  Administered 2020-07-21: 150 mg via INTRAVENOUS
  Filled 2020-07-21: qty 150

## 2020-07-21 MED ORDER — HEPARIN SOD (PORK) LOCK FLUSH 100 UNIT/ML IV SOLN
500.0000 [IU] | Freq: Once | INTRAVENOUS | Status: AC | PRN
  Administered 2020-07-21: 500 [IU]
  Filled 2020-07-21: qty 5

## 2020-07-21 MED ORDER — SODIUM CHLORIDE 0.9 % IV SOLN
750.0000 mg/m2 | Freq: Once | INTRAVENOUS | Status: AC
  Administered 2020-07-21: 1580 mg via INTRAVENOUS
  Filled 2020-07-21: qty 79

## 2020-07-21 MED ORDER — FAMOTIDINE IN NACL 20-0.9 MG/50ML-% IV SOLN
INTRAVENOUS | Status: AC
  Filled 2020-07-21: qty 50

## 2020-07-21 MED ORDER — PALONOSETRON HCL INJECTION 0.25 MG/5ML
INTRAVENOUS | Status: AC
  Filled 2020-07-21: qty 5

## 2020-07-21 MED ORDER — SODIUM CHLORIDE 0.9 % IV SOLN
Freq: Once | INTRAVENOUS | Status: AC
  Filled 2020-07-21: qty 250

## 2020-07-21 MED ORDER — FAMOTIDINE IN NACL 20-0.9 MG/50ML-% IV SOLN
20.0000 mg | Freq: Once | INTRAVENOUS | Status: AC | PRN
  Administered 2020-07-21: 20 mg via INTRAVENOUS

## 2020-07-21 MED ORDER — ACETAMINOPHEN 325 MG PO TABS
650.0000 mg | ORAL_TABLET | Freq: Once | ORAL | Status: AC
  Administered 2020-07-21: 650 mg via ORAL

## 2020-07-21 MED ORDER — ACETAMINOPHEN 160 MG/5ML PO SOLN
325.0000 mg | Freq: Once | ORAL | Status: AC
  Administered 2020-07-21: 325 mg via ORAL

## 2020-07-21 MED ORDER — VINCRISTINE SULFATE CHEMO INJECTION 1 MG/ML
2.0000 mg | Freq: Once | INTRAVENOUS | Status: AC
  Administered 2020-07-21: 2 mg via INTRAVENOUS
  Filled 2020-07-21: qty 2

## 2020-07-21 MED ORDER — ZOLPIDEM TARTRATE ER 12.5 MG PO TBCR: 12.5000 mg | EXTENDED_RELEASE_TABLET | Freq: Every evening | ORAL | 0 refills | Status: DC | PRN

## 2020-07-21 MED ORDER — SODIUM CHLORIDE 0.9 % IV SOLN
375.0000 mg/m2 | Freq: Once | INTRAVENOUS | Status: AC
  Administered 2020-07-21: 800 mg via INTRAVENOUS
  Filled 2020-07-21: qty 50

## 2020-07-21 MED ORDER — SODIUM CHLORIDE 0.9 % IV SOLN
Freq: Once | INTRAVENOUS | Status: DC | PRN
  Filled 2020-07-21: qty 250

## 2020-07-21 MED ORDER — SODIUM CHLORIDE 0.9 % IV SOLN
10.0000 mg | Freq: Once | INTRAVENOUS | Status: AC
  Administered 2020-07-21: 10 mg via INTRAVENOUS
  Filled 2020-07-21: qty 10

## 2020-07-21 NOTE — Progress Notes (Signed)
Pt. receiving Rituximab per titrated rate and heart rate increased to 106, temperature 100.2 and pt. complained of dizziness, no shortness of breath noted, denies chest pain. Sandi Mealy PA in for observation/assessment and pt. received Tylenol 325 mg po and Pepcid 20 mg IV.

## 2020-07-21 NOTE — Progress Notes (Signed)
HEMATOLOGY/ONCOLOGY CLINIC NOTE  Date of Service: 07/21/2020  Patient Care Team: [name], MD as PCP - General (Internal Medicine) Debara Pickett Nadean Corwin, MD as PCP - Cardiology (Cardiology) [name], MD (Internal Medicine)  CHIEF COMPLAINTS/PURPOSE OF CONSULTATION:  Mx of high grade follicular lymphoma  HISTORY OF PRESENTING ILLNESS:   George Proctor is a wonderful 64 y.o. male who has been referred to [place] by Dr. Shelia Media for evaluation and management of possible wide-spread lymphoma - palpable mass in abdomen. Pt is accompanied today by his wife. The pt reports that he is doing well overall.   The pt reports that about three weeks ago he began experiencing abdominal fullness. Two weeks ago he began to feel abdominal pain. Lying down makes this discomfort worse. He has not had a restful sleep in two weeks. The abdominal fullness has caused lack of appetite, belching, and one episode of nausea and vomiting. He is currently eating 1/3-1/2 of his baseline. Pt has been taking Tramadol to control his pain, as well as a stool softener. His bowel movements look different, but he denies any constipation. He is hot natured and has mild night sweats, but denies drenching night sweats. Pt feels that he has been short of breath for awhile, but that this may have worsened lately. Pt follows with Dr. Benson Norway, GI.   He had abdominal surgery as a child after an accident. Pt has had GI bleeding and ulcers from taking NSAIDS. Pt has had several instances of abdominal swelling that were triggered by illness. Pt had esophageal dialation. His strictures were thought to be caused by acid reflux. He also has an umbilical hernia. He has chronic back pain. Pt was diagnosed with chronic lyme disease by a [place]. He has a history of balance issues and has brought this up to his PCP, who did not feel that it required a work up. He does not have a true allergy, but experiences nausea and vomiting when  taking Omeprazole. Pt denies any international travel in the last year.   Pt has been taking weekly Testosterone for 3-4 months. He was experiencing fatigue prior to beginning the hormonal injections. Pt was placed on Latuda & Lamictil several weeks ago for his Depression and Anxiety, but discontinued a week ago as he did not see any improvement in mood. He is not currently following with a mental health professional.   Dr. Shelia Media referred pt to Dr. Redmond Pulling at [facility] for biopsy consideration. Pt is scheduled to see Dr. Shelia Media next Wednesday.   Of note prior to the patient's visit today, pt has had  CT Abd/Pel completed on 06/05/2020 with results revealing numerous cardiophrenic, retroperitoneal and mesenteric adenopathy most consistent with lymphoma. Small volume of ascites. Hepatosplenomegaly.  Most recent lab results (03/22/2020) of CBC is as follows: all values are WNL except for WBC at 3.5K, PLT at 131K, Lymphs Rel at 17.6, Total Protein at 6.3.  On review of systems, pt reports worsening SOB, abdominal pain, abdominal fullness, low appetite, dyspepsia, mild night sweats and denies fevers, chills, rash, headaches, leg swelling, urinary habit changes, constipation, testicular pain/swelling and any other symptoms.   On PMHx the pt reports Depression, Anxiety, Abdominal Surgery, Gait difficulty, Esophageal dilation. On Family Hx the pt reports that his father and paternal aunt had polymyalgia rheumatica. His paternal aunt also had breast cancer.  INTERVAL HISTORY: George Proctor is a wonderful 64 y.o. male who is here for evaluation and management of lymphoma.  The patient's last visit with [place] was on 07/14/2020. The pt reports that he is doing well overall.  The pt reports that he is currently taking 1-2 Oxycodone daily. Pt is taking 2 tablets of Senna at night, which is helping to keep his bowels moving. Pt notes that he has been having more difficulty sleeping lately. He has been  taking Prednisone continuously since his hospital discharge. Pt feels better overall after his first treatment and acknowledges a significant reduction in abdominal fullness.  Lab results today (07/21/20) of CBC w/diff and CMP is as follows: all values are WNL except for WBC at 14.4K, Neutro Abs at 12.4K, Lymphs Abs at 0.6K, Baso Abs at 0.2K, Abs Immature Granulocytes at 0.69K, Glucose at 100, Total Protein at 5.7. 07/21/2020 LDH at 177 07/21/2020 Uric acid at 3.6   On review of systems, pt reports improving abdominal fullness, mild constipation, sleeplessness and denies leg swelling, nausea, vomiting, SOB, chest pain, fevers, chills, night sweats, rash and any other symptoms.   MEDICAL HISTORY:  Past Medical History:  Diagnosis Date  . Anxiety   . Depression   . Gait difficulty   . Lymphoma ([place])   . Memory loss    SURGICAL HISTORY: Past Surgical History:  Procedure Laterality Date  . ABDOMINAL SURGERY     Childhood  . BUNIONECTOMY    . IR IMAGING GUIDED PORT INSERTION  06/30/2020  . IR PARACENTESIS  06/26/2020  . IR US GUIDE BX ASP/DRAIN  06/26/2020    SOCIAL HISTORY: Social History   Socioeconomic History  . Marital status: Married    Spouse name: Not on file  . Number of children: 0  . Years of education: Veterinary surgeon  . Highest education level: Not on file  Occupational History  . Occupation: Scientist, research (physical sciences)  Tobacco Use  . Smoking status: Never Smoker  . Smokeless tobacco: Never Used  Vaping Use  . Vaping Use: Never used  Substance and Sexual Activity  . Alcohol use: No    Alcohol/week: 0.0 standard drinks    Comment: Less than one drink per week.  . Drug use: No  . Sexual activity: Not Currently  Other Topics Concern  . Not on file  Social History Narrative   Lives at home with wife.   Right-handed.   3 cups caffeine per day.   Social Determinants of Health   Financial Resource Strain:   . Difficulty of Paying Living Expenses: Not  on file  Food Insecurity:   . Worried About Charity fundraiser in the Last Year: Not on file  . Ran Out of Food in the Last Year: Not on file  Transportation Needs:   . Lack of Transportation (Medical): Not on file  . Lack of Transportation (Non-Medical): Not on file  Physical Activity:   . Days of Exercise per Week: Not on file  . Minutes of Exercise per Session: Not on file  Stress:   . Feeling of Stress : Not on file  Social Connections:   . Frequency of Communication with Friends and Family: Not on file  . Frequency of Social Gatherings with Friends and Family: Not on file  . Attends Religious Services: Not on file  . Active Member of Clubs or Organizations: Not on file  . Attends Archivist Meetings: Not on file  . Marital Status: Not on file  Intimate Partner Violence:   . Fear of Current or Ex-Partner: Not on file  . Emotionally Abused: Not  on file  . Physically Abused: Not on file  . Sexually Abused: Not on file    FAMILY HISTORY: Family History  Problem Relation Age of Onset  . Aneurysm Mother 69       Thoracic  . Hypertension Father   . Heart failure Father   . Heart disease Father   . Heart attack Paternal Grandfather   . Depression Cousin   . Bipolar disorder Cousin   . Schizophrenia Maternal Uncle     ALLERGIES:  is allergic to omeprazole.  MEDICATIONS:  Current Outpatient Medications  Medication Sig Dispense Refill  . allopurinol (ZYLOPRIM) 100 MG tablet Take 1 tablet (100 mg total) by mouth 2 (two) times daily. 60 tablet 2  . Ascorbic Acid (VITAMIN C) 1000 MG tablet Take 1,000 mg by mouth daily.    . B Complex Vitamins (VITAMIN B COMPLEX PO) Take 1 tablet by mouth daily.     Marland Kitchen lidocaine-prilocaine (EMLA) cream Apply to affected area once 30 g 3  . LORazepam (ATIVAN) 0.5 MG tablet Take 1 tablet (0.5 mg total) by mouth every 6 (six) hours as needed (Nausea or vomiting). 30 tablet 0  . Multiple Vitamin (MULTIVITAMIN) capsule Take 1 capsule by  mouth daily.    . ondansetron (ZOFRAN) 8 MG tablet Take 1 tablet (8 mg total) by mouth every 8 (eight) hours as needed for vomiting or refractory nausea / vomiting. Start on day 3 after cyclophosphamide chemotherapy. 30 tablet 1  . oxyCODONE (OXY IR/ROXICODONE) 5 MG immediate release tablet Take 1 tablet (5 mg total) by mouth every 4 (four) hours as needed for severe pain. 30 tablet 0  . pantoprazole (PROTONIX) 40 MG tablet Take 40 mg by mouth 2 (two) times daily as needed (indigestion).     . polyethylene glycol (MIRALAX) 17 g packet Take 17 g by mouth daily. 30 each 1  . predniSONE (DELTASONE) 20 MG tablet Prednisone 60 mg po daily for 5 days with each cycle of R-CHOP. Take with food. 15 tablet 3  . prochlorperazine (COMPAZINE) 10 MG tablet Take 1 tablet (10 mg total) by mouth every 6 (six) hours as needed (Nausea or vomiting). 30 tablet 6  . senna-docusate (SENNA S) 8.6-50 MG tablet Take 2 tablets by mouth at bedtime. 60 tablet 1  . testosterone cypionate (DEPOTESTOSTERONE CYPIONATE) 200 MG/ML injection Inject into the muscle once a week.     . zolpidem (AMBIEN CR) 12.5 MG CR tablet Take 1 tablet (12.5 mg total) by mouth at bedtime as needed for sleep. 30 tablet 0   No current facility-administered medications for this visit.   Facility-Administered Medications Ordered in Other Visits  Medication Dose Route Frequency Provider Last Rate Last Admin  . cyclophosphamide (CYTOXAN) 1,580 mg in sodium chloride 0.9 % 250 mL chemo infusion  750 mg/m2 (Treatment Plan Recorded) Intravenous Once Brunetta Genera, MD      . dexamethasone (DECADRON) 10 mg in sodium chloride 0.9 % 50 mL IVPB  10 mg Intravenous Once Brunetta Genera, MD      . DOXOrubicin (ADRIAMYCIN) chemo injection 106 mg  50 mg/m2 (Treatment Plan Recorded) Intravenous Once Brunetta Genera, MD      . fosaprepitant (EMEND) 150 mg in sodium chloride 0.9 % 145 mL IVPB  150 mg Intravenous Once Brunetta Genera, MD      . heparin  lock flush 100 unit/mL  500 Units Intracatheter Once PRN Brunetta Genera, MD      . palonosetron (ALOXI) injection  0.25 mg  0.25 mg Intravenous Once Brunetta Genera, MD      . riTUXimab-pvvr (RUXIENCE) 800 mg in sodium chloride 0.9 % 250 mL (2.4242 mg/mL) infusion  375 mg/m2 (Treatment Plan Recorded) Intravenous Once Brunetta Genera, MD      . sodium chloride flush (NS) 0.9 % injection 10 mL  10 mL Intracatheter PRN Brunetta Genera, MD      . vinCRIStine (ONCOVIN) 2 mg in sodium chloride 0.9 % 50 mL chemo infusion  2 mg Intravenous Once Brunetta Genera, MD        REVIEW OF SYSTEMS:   A 10+ POINT REVIEW OF SYSTEMS WAS OBTAINED including neurology, dermatology, psychiatry, cardiac, respiratory, lymph, extremities, GI, GU, Musculoskeletal, constitutional, breasts, reproductive, HEENT.  All pertinent positives are noted in the HPI.  All others are negative.   PHYSICAL EXAMINATION: ECOG PERFORMANCE STATUS: 2 - Symptomatic, <50% confined to bed  . Vitals:   07/21/20 0949  BP: 113/81  Pulse: 97  Resp: 18  Temp: (!) 96 F (35.6 C)  SpO2: 99%   Filed Weights   07/21/20 0949  Weight: 163 lb 8 oz (74.2 kg)   .Body mass index is 21.57 kg/m.  GENERAL:alert, in no acute distress and comfortable SKIN: no acute rashes, no significant lesions EYES: conjunctiva are pink and non-injected, sclera anicteric OROPHARYNX: MMM, no exudates, no oropharyngeal erythema or ulceration NECK: supple, no JVD LYMPH:  no palpable lymphadenopathy in the cervical, axillary or inguinal regions  LUNGS: clear to auscultation b/l with normal respiratory effort HEART: regular rate & rhythm ABDOMEN:  normoactive bowel sounds , non tender, not distended. No palpable hepatosplenomegaly.  Extremity: no pedal edema PSYCH: alert & oriented x 3 with fluent speech NEURO: no focal motor/sensory deficits  LABORATORY DATA:  I have reviewed the data as listed  . CBC Latest Ref Rng & Units 07/21/2020  07/14/2020 07/02/2020  WBC 4.0 - 10.5 K/uL 14.4(H) 17.0(H) 7.7  Hemoglobin 13.0 - 17.0 g/dL 13.8 13.8 12.6(L)  Hematocrit 39 - 52 % 42.4 42.1 37.9(L)  Platelets 150 - 400 K/uL 243 178 304    . CMP Latest Ref Rng & Units 07/21/2020 07/14/2020 07/02/2020  Glucose 70 - 99 mg/dL 100(H) 140(H) 102(H)  BUN 8 - 23 mg/dL 16 17 28(H)  Creatinine 0.61 - 1.24 mg/dL 0.80 0.87 0.74  Sodium 135 - 145 mmol/L 138 139 135  Potassium 3.5 - 5.1 mmol/L 4.5 4.1 4.2  Chloride 98 - 111 mmol/L 104 103 101  CO2 22 - 32 mmol/L 29 27 26   Calcium 8.9 - 10.3 mg/dL 9.0 9.2 8.3(L)  Total Protein 6.5 - 8.1 g/dL 5.7(L) 5.9(L) 5.0(L)  Total Bilirubin 0.3 - 1.2 mg/dL 0.6 0.3 0.7  Alkaline Phos 38 - 126 U/L 93 140(H) 75  AST 15 - 41 U/L 15 22 54(H)  ALT 0 - 44 U/L 21 37 20   . Lab Results  Component Value Date   LDH 177 07/21/2020   Ric acid 3.9  RADIOGRAPHIC STUDIES: I have personally reviewed the radiological images as listed and agreed with the findings in the report. CT Angio Chest PE W and/or Wo Contrast  Result Date: 06/24/2020 CLINICAL DATA:  64 year old male with recent diagnosis of lymphoma presenting with shortness of breath. Concern for pulmonary embolism. EXAM: CT ANGIOGRAPHY CHEST CT ABDOMEN AND PELVIS WITH CONTRAST TECHNIQUE: Multidetector CT imaging of the chest was performed using the standard protocol during bolus administration of intravenous contrast. Multiplanar CT image reconstructions and MIPs  were obtained to evaluate the vascular anatomy. Multidetector CT imaging of the abdomen and pelvis was performed using the standard protocol during bolus administration of intravenous contrast. CONTRAST:  134mL OMNIPAQUE IOHEXOL 350 MG/ML SOLN COMPARISON:  Chest radiograph dated 06/24/2020. FINDINGS: Evaluation of this exam is limited due to respiratory motion artifact. CTA CHEST FINDINGS Cardiovascular: There is no cardiomegaly or pericardial effusion. The thoracic aorta is unremarkable. Evaluation of the  pulmonary arteries is limited due to respiratory motion artifact and suboptimal opacification and timing of the contrast. No large or central pulmonary artery embolus identified. Mediastinum/Nodes: There is no hilar adenopathy. Enlarged lymph nodes noted anterior to the heart and in the anterior cardiophrenic space. The esophagus and the thyroid gland are grossly unremarkable. No mediastinal fluid collection. Lungs/Pleura: Small to moderate bilateral pleural effusions with associated partial compressive atelectasis the lower lobes. Pneumonia is not excluded clinical correlation is recommended. There is no pneumothorax. The central airways are patent. Musculoskeletal: No chest wall abnormality. No acute or significant osseous findings. Review of the MIP images confirms the above findings. CT ABDOMEN and PELVIS FINDINGS No intra-abdominal free air. There is moderate ascites. Hepatobiliary: The liver is unremarkable. No intrahepatic biliary ductal dilatation. The gallbladder is unremarkable. Pancreas: The pancreas is suboptimally visualized but grossly unremarkable. Spleen: Normal in size without focal abnormality. Adrenals/Urinary Tract: The adrenal glands unremarkable. There is mild left hydronephrosis. There is delayed excretion of contrast by left kidney. The right kidney is unremarkable. The urinary bladder is grossly unremarkable. Stomach/Bowel: There is moderate stool throughout the colon. There is no bowel obstruction. The appendix is not visualized with certainty. No inflammatory changes identified in the right lower quadrant. Vascular/Lymphatic: Mild aortoiliac atherosclerotic disease. The IVC is grossly unremarkable. No portal venous gas. Extensive retroperitoneal adenopathy encasing the abdominal aorta and iliac arteries. There is diffuse mesenteric and omental implants and caking. Large soft tissue mass in the mesentery encasing the SMA measures approximately 11 x 17 cm in axial dimensions and 15 cm in  craniocaudal length. Reproductive: The prostate is grossly unremarkable. Other: Diffuse subcutaneous edema. Small fat containing umbilical hernia. Musculoskeletal: No acute or significant osseous findings. Review of the MIP images confirms the above findings. IMPRESSION: 1. No CT evidence of central pulmonary artery embolus. 2. Small to moderate bilateral pleural effusions with associated partial compressive atelectasis of the lower lobes. Pneumonia is not excluded clinical correlation is recommended. 3. Extensive retroperitoneal, mesenteric, and omental adenopathy in keeping with history of lymphoma. Enlarged lymph nodes in the anterior cardiophrenic space. 4. Moderate ascites. 5. Mild left hydronephrosis. 6. Aortic Atherosclerosis (ICD10-I70.0). Electronically Signed   By: Anner Crete M.D.   On: 06/24/2020 16:02   CT ABDOMEN PELVIS W CONTRAST  Result Date: 06/24/2020 CLINICAL DATA:  63 year old male with recent diagnosis of lymphoma presenting with shortness of breath. Concern for pulmonary embolism. EXAM: CT ANGIOGRAPHY CHEST CT ABDOMEN AND PELVIS WITH CONTRAST TECHNIQUE: Multidetector CT imaging of the chest was performed using the standard protocol during bolus administration of intravenous contrast. Multiplanar CT image reconstructions and MIPs were obtained to evaluate the vascular anatomy. Multidetector CT imaging of the abdomen and pelvis was performed using the standard protocol during bolus administration of intravenous contrast. CONTRAST:  137mL OMNIPAQUE IOHEXOL 350 MG/ML SOLN COMPARISON:  Chest radiograph dated 06/24/2020. FINDINGS: Evaluation of this exam is limited due to respiratory motion artifact. CTA CHEST FINDINGS Cardiovascular: There is no cardiomegaly or pericardial effusion. The thoracic aorta is unremarkable. Evaluation of the pulmonary arteries is limited due to respiratory  motion artifact and suboptimal opacification and timing of the contrast. No large or central pulmonary  artery embolus identified. Mediastinum/Nodes: There is no hilar adenopathy. Enlarged lymph nodes noted anterior to the heart and in the anterior cardiophrenic space. The esophagus and the thyroid gland are grossly unremarkable. No mediastinal fluid collection. Lungs/Pleura: Small to moderate bilateral pleural effusions with associated partial compressive atelectasis the lower lobes. Pneumonia is not excluded clinical correlation is recommended. There is no pneumothorax. The central airways are patent. Musculoskeletal: No chest wall abnormality. No acute or significant osseous findings. Review of the MIP images confirms the above findings. CT ABDOMEN and PELVIS FINDINGS No intra-abdominal free air. There is moderate ascites. Hepatobiliary: The liver is unremarkable. No intrahepatic biliary ductal dilatation. The gallbladder is unremarkable. Pancreas: The pancreas is suboptimally visualized but grossly unremarkable. Spleen: Normal in size without focal abnormality. Adrenals/Urinary Tract: The adrenal glands unremarkable. There is mild left hydronephrosis. There is delayed excretion of contrast by left kidney. The right kidney is unremarkable. The urinary bladder is grossly unremarkable. Stomach/Bowel: There is moderate stool throughout the colon. There is no bowel obstruction. The appendix is not visualized with certainty. No inflammatory changes identified in the right lower quadrant. Vascular/Lymphatic: Mild aortoiliac atherosclerotic disease. The IVC is grossly unremarkable. No portal venous gas. Extensive retroperitoneal adenopathy encasing the abdominal aorta and iliac arteries. There is diffuse mesenteric and omental implants and caking. Large soft tissue mass in the mesentery encasing the SMA measures approximately 11 x 17 cm in axial dimensions and 15 cm in craniocaudal length. Reproductive: The prostate is grossly unremarkable. Other: Diffuse subcutaneous edema. Small fat containing umbilical hernia.  Musculoskeletal: No acute or significant osseous findings. Review of the MIP images confirms the above findings. IMPRESSION: 1. No CT evidence of central pulmonary artery embolus. 2. Small to moderate bilateral pleural effusions with associated partial compressive atelectasis of the lower lobes. Pneumonia is not excluded clinical correlation is recommended. 3. Extensive retroperitoneal, mesenteric, and omental adenopathy in keeping with history of lymphoma. Enlarged lymph nodes in the anterior cardiophrenic space. 4. Moderate ascites. 5. Mild left hydronephrosis. 6. Aortic Atherosclerosis (ICD10-I70.0). Electronically Signed   By: Anner Crete M.D.   On: 06/24/2020 16:02   US Abdomen Limited  Result Date: 06/29/2020 CLINICAL DATA:  Question ascites. EXAM: LIMITED ABDOMEN ULTRASOUND FOR ASCITES TECHNIQUE: Limited ultrasound survey for ascites was performed in all four abdominal quadrants. COMPARISON:  CT 06/24/2020 FINDINGS: Four-quadrant scanning shows a small amount of ascites without any accessible collection. This appears considerably diminished in comparison to the CT of 5 days ago. IMPRESSION: Small amount of ascites. Amount appears considerably reduced compared with a CT scan of 5 days ago. Electronically Signed   By: Nelson Chimes M.D.   On: 06/29/2020 15:24   IR US Guide Bx Asp/Drain  Result Date: 06/26/2020 INDICATION: Concern for lymphoma. Please perform ultrasound-guided biopsy of hypermetabolic inguinal lymph node for tissue diagnostic purposes Additionally, patient currently admitted with abdominal pain and distension and as such request made for ascites search ultrasound ultrasound-guided paracentesis for diagnostic and therapeutic purposes as indicated. EXAM: 1. ULTRASOUND-GUIDED RIGHT INGUINAL LYMPH NODE BIOPSY 2. ULTRASOUND-GUIDED PARACENTESIS COMPARISON:  CT of the chest, abdomen and pelvis- 06/24/2020; PET-CT-06/21/2020 MEDICATIONS: None. COMPLICATIONS: None immediate. TECHNIQUE:  Informed written consent was obtained from the patient after a discussion of the risks, benefits and alternatives to treatment. A timeout was performed prior to the initiation of the procedure. Initial ultrasound scanning demonstrates a small amount of intra-abdominal ascites with dominant pocket located  within right upper abdomen adjacent to the hepatic parenchyma. As such, the skin overlying the right upper abdomen was subsequently prepped and draped in the usual sterile fashion. 1% lidocaine with epinephrine was used for local anesthesia. Under direct ultrasound guidance, an 8 Fr Safe-T-Centesis catheter was introduced. The paracentesis was performed. A small amount of aspirated fluid was capped and sent to the laboratory for cytologic analysis as well as triglyceride levels. The catheter was removed and a dressing was applied. Next, attention was paid towards ultrasound-guided biopsy of known hypermetabolic right inguinal lymphadenopathy. Sonographic evaluation of the right groin demonstrates an at least 2.4 x 0.8 cm right inguinal lymph node correlating with the dominant right inguinal lymph node seen on preceding abdominal CT image 90, series 2. The procedure was planned. The right groin was prepped and draped in usual sterile fashion. After the overlying soft tissues anesthetized 1% lidocaine with epinephrine, an 18 gauge core needle biopsy device was utilized to obtain 6 core needle biopsy samples. Multiple ultrasound images were saved procedural documentation purposes. Samples were placed in saline and submitted to the laboratory for pathologic analysis. The patient tolerated the above procedures well without immediate postprocedural complication. FINDINGS: A total of approximately 2 liters of chylous fluid was removed. Samples were sent to the laboratory as requested by the clinical team. Successful ultrasound-guided biopsy dominant right inguinal lymph node IMPRESSION: 1. Successful ultrasound-guided  biopsy of dominant right inguinal node. 2. Successful ultrasound-guided paracentesis yielding 2 liters of chylous appearing fluid. Fluid was sent to the laboratory for cytologic analysis as well as triglyceride levels. Electronically Signed   By: Sandi Mariscal M.D.   On: 06/26/2020 13:16   DG Chest Port 1 View  Result Date: 06/24/2020 CLINICAL DATA:  Abdominal pain and swelling.  History of lymphoma. EXAM: PORTABLE CHEST 1 VIEW COMPARISON:  Chest radiograph 11/17/2017 FINDINGS: Normal cardiac and mediastinal contours. Small bilateral pleural effusions with underlying consolidation. No pleural effusion or pneumothorax. Osseous structures unremarkable. IMPRESSION: Small bilateral pleural effusions with underlying consolidation. Electronically Signed   By: Lovey Newcomer M.D.   On: 06/24/2020 14:11   ECHOCARDIOGRAM COMPLETE  Result Date: 06/25/2020    ECHOCARDIOGRAM REPORT   Patient Name:   FELICIA BOTH Date of Exam: 06/25/2020 Medical Rec #:  [id number]      Height:       73.0 in Accession #:    [id number]     Weight:       189.0 lb Date of Birth:  Aug 17, 1956      BSA:          2.101 m Patient Age:    56 years       BP:           136/92 mmHg Patient Gender: M              HR:           120 bpm. Exam Location:  Inpatient Procedure: 2D Echo Indications:    786.09 dyspnea  History:        Patient has prior history of Echocardiogram examinations, most                 recent 08/11/2019. Lymphoma.  Sonographer:    Jannett Celestine RDCS (AE) Referring Phys: [phone] [facility]  Sonographer Comments: Suboptimal parasternal window and suboptimal subcostal window. IMPRESSIONS  1. Left ventricular ejection fraction, by estimation, is 65 to 70%. The left ventricle has normal function. The left ventricle has no regional wall  motion abnormalities. Left ventricular diastolic parameters are consistent with Grade I diastolic dysfunction (impaired relaxation).  2. Right ventricular systolic function is hyperdynamic. The right  ventricular size is normal.  3. The mitral valve is normal in structure. No evidence of mitral valve regurgitation. No evidence of mitral stenosis.  4. The aortic valve is normal in structure. Aortic valve regurgitation is not visualized. No aortic stenosis is present. FINDINGS  Left Ventricle: Left ventricular ejection fraction, by estimation, is 65 to 70%. The left ventricle has normal function. The left ventricle has no regional wall motion abnormalities. The left ventricular internal cavity size was normal in size. There is  no left ventricular hypertrophy. Left ventricular diastolic parameters are consistent with Grade I diastolic dysfunction (impaired relaxation). Right Ventricle: The right ventricular size is normal. No increase in right ventricular wall thickness. Right ventricular systolic function is hyperdynamic. Left Atrium: Left atrial size was normal in size. Right Atrium: Right atrial size was normal in size. Pericardium: There is no evidence of pericardial effusion. Mitral Valve: The mitral valve is normal in structure. No evidence of mitral valve regurgitation. No evidence of mitral valve stenosis. Tricuspid Valve: The tricuspid valve is normal in structure. Tricuspid valve regurgitation is trivial. No evidence of tricuspid stenosis. Aortic Valve: The aortic valve is normal in structure. Aortic valve regurgitation is not visualized. No aortic stenosis is present. Pulmonic Valve: The pulmonic valve was normal in structure. Pulmonic valve regurgitation is not visualized. No evidence of pulmonic stenosis. Aorta: The aortic root is normal in size and structure. Venous: The inferior vena cava was not well visualized. IAS/Shunts: No atrial level shunt detected by color flow Doppler.  LEFT VENTRICLE PLAX 2D LVOT diam:     2.50 cm LV SV:         72 LV SV Index:   34 LVOT Area:     4.91 cm  RIGHT VENTRICLE RV S prime:     18.00 cm/s TAPSE (M-mode): 1.8 cm LEFT ATRIUM             Index       RIGHT ATRIUM            Index LA Vol (A2C):   60.4 ml 28.75 ml/m RA Area:     15.80 cm LA Vol (A4C):   42.5 ml 20.23 ml/m RA Volume:   32.40 ml  15.42 ml/m LA Biplane Vol: 54.0 ml 25.71 ml/m  AORTIC VALVE LVOT Vmax:   95.30 cm/s LVOT Vmean:  74.500 cm/s LVOT VTI:    0.146 m MITRAL VALVE               TRICUSPID VALVE MV Area (PHT): 3.31 cm    TR Peak grad:   13.7 mmHg MV Decel Time: 229 msec    TR Vmax:        185.00 cm/s MV E velocity: 69.40 cm/s MV A velocity: 87.00 cm/s  SHUNTS MV E/A ratio:  0.80        Systemic VTI:  0.15 m                            Systemic Diam: 2.50 cm Skeet Latch MD Electronically signed by Skeet Latch MD Signature Date/Time: 06/25/2020/4:06:39 PM    Final    IR IMAGING GUIDED PORT INSERTION  Result Date: 06/30/2020 CLINICAL DATA:  High-grade follicular lymphoma and need for porta cath to begin chemotherapy. EXAM: IMPLANTED PORT A CATH PLACEMENT WITH ULTRASOUND  AND FLUOROSCOPIC GUIDANCE ANESTHESIA/SEDATION: 4.0 mg IV Versed; 100 mcg IV Fentanyl Total Moderate Sedation Time:  32 minutes The patient's level of consciousness and physiologic status were continuously monitored during the procedure by Radiology nursing. Additional Medications: 2 g IV Ancef. FLUOROSCOPY TIME:  24 seconds.  9.0 mGy. PROCEDURE: The procedure, risks, benefits, and alternatives were explained to the patient. Questions regarding the procedure were encouraged and answered. The patient understands and consents to the procedure. A time-out was performed prior to initiating the procedure. Ultrasound was utilized to confirm patency of the right internal jugular vein. The right neck and chest were prepped with chlorhexidine in a sterile fashion, and a sterile drape was applied covering the operative field. Maximum barrier sterile technique with sterile gowns and gloves were used for the procedure. Local anesthesia was provided with 1% lidocaine. After creating a small venotomy incision, a 21 gauge needle was advanced into  the right internal jugular vein under direct, real-time ultrasound guidance. Ultrasound image documentation was performed. After securing guidewire access, an 8 Fr dilator was placed. A J-wire was kinked to measure appropriate catheter length. A subcutaneous port pocket was then created along the upper chest wall utilizing sharp and blunt dissection. Portable cautery was utilized. The pocket was irrigated with sterile saline. A single lumen power injectable port was chosen for placement. The 8 Fr catheter was tunneled from the port pocket site to the venotomy incision. The port was placed in the pocket. External catheter was trimmed to appropriate length based on guidewire measurement. At the venotomy, an 8 Fr peel-away sheath was placed over a guidewire. The catheter was then placed through the sheath and the sheath removed. Final catheter positioning was confirmed and documented with a fluoroscopic spot image. The port was accessed with a needle and aspirated and flushed with heparinized saline. The access needle was removed. The venotomy and port pocket incisions were closed with subcutaneous 3-0 Monocryl and subcuticular 4-0 Vicryl. Dermabond was applied to both incisions. COMPLICATIONS: COMPLICATIONS None FINDINGS: After catheter placement, the tip lies at the cavo-atrial junction. The catheter aspirates normally and is ready for immediate use. IMPRESSION: Placement of single lumen port a cath via right internal jugular vein. The catheter tip lies at the cavo-atrial junction. A power injectable port a cath was placed and is ready for immediate use. Electronically Signed   By: Aletta Edouard M.D.   On: 06/30/2020 16:58   VAS [place] LOWER EXTREMITY VENOUS (DVT)  Result Date: 06/29/2020  Lower Venous DVTStudy Indications: Edema.  Risk Factors: Underlying diagnosis of Non-Hodgkin's lymphoma. Limitations: Poor ultrasound/tissue interface. Performing Technologist: Maudry Mayhew MHA, RDMS, RVT, RDCS   Examination Guidelines: A complete evaluation includes B-mode imaging, spectral Doppler, color Doppler, and power Doppler as needed of all accessible portions of each vessel. Bilateral testing is considered an integral part of a complete examination. Limited examinations for reoccurring indications may be performed as noted. The reflux portion of the exam is performed with the patient in reverse Trendelenburg.  +---------+---------------+---------+-----------+----------+--------------+ RIGHT    CompressibilityPhasicitySpontaneityPropertiesThrombus Aging +---------+---------------+---------+-----------+----------+--------------+ CFV      Full           Yes      Yes                                 +---------+---------------+---------+-----------+----------+--------------+ SFJ      Full                                                        +---------+---------------+---------+-----------+----------+--------------+  FV Prox  Full                                                        +---------+---------------+---------+-----------+----------+--------------+ FV Mid   Full                                                        +---------+---------------+---------+-----------+----------+--------------+ FV DistalFull                                                        +---------+---------------+---------+-----------+----------+--------------+ PFV      Full                                                        +---------+---------------+---------+-----------+----------+--------------+ POP      Full           Yes      Yes                                 +---------+---------------+---------+-----------+----------+--------------+ PTV      Full                                                        +---------+---------------+---------+-----------+----------+--------------+ PERO     Full                                                         +---------+---------------+---------+-----------+----------+--------------+   +----+---------------+---------+-----------+----------+--------------+ LEFTCompressibilityPhasicitySpontaneityPropertiesThrombus Aging +----+---------------+---------+-----------+----------+--------------+ CFV Full           Yes      Yes                                 +----+---------------+---------+-----------+----------+--------------+     Summary: RIGHT: - There is no evidence of deep vein thrombosis in the lower extremity.  - No cystic structure found in the popliteal fossa. - Ultrasound characteristics of enlarged lymph nodes are noted in the groin.  LEFT: - No evidence of common femoral vein obstruction. - Ultrasound characteristics of enlarged lymph nodes noted in the groin.  *See table(s) above for measurements and observations. Electronically signed by Servando Snare MD on 06/29/2020 at 5:37:15 PM.    Final    IR Paracentesis  Result Date: 06/26/2020 INDICATION: Concern for lymphoma. Please perform ultrasound-guided biopsy of hypermetabolic inguinal lymph node for tissue diagnostic purposes Additionally, patient currently admitted with abdominal  pain and distension and as such request made for ascites search ultrasound ultrasound-guided paracentesis for diagnostic and therapeutic purposes as indicated. EXAM: 1. ULTRASOUND-GUIDED RIGHT INGUINAL LYMPH NODE BIOPSY 2. ULTRASOUND-GUIDED PARACENTESIS COMPARISON:  CT of the chest, abdomen and pelvis- 06/24/2020; PET-CT-06/21/2020 MEDICATIONS: None. COMPLICATIONS: None immediate. TECHNIQUE: Informed written consent was obtained from the patient after a discussion of the risks, benefits and alternatives to treatment. A timeout was performed prior to the initiation of the procedure. Initial ultrasound scanning demonstrates a small amount of intra-abdominal ascites with dominant pocket located within right upper abdomen adjacent to the hepatic parenchyma. As such, the skin  overlying the right upper abdomen was subsequently prepped and draped in the usual sterile fashion. 1% lidocaine with epinephrine was used for local anesthesia. Under direct ultrasound guidance, an 8 Fr Safe-T-Centesis catheter was introduced. The paracentesis was performed. A small amount of aspirated fluid was capped and sent to the laboratory for cytologic analysis as well as triglyceride levels. The catheter was removed and a dressing was applied. Next, attention was paid towards ultrasound-guided biopsy of known hypermetabolic right inguinal lymphadenopathy. Sonographic evaluation of the right groin demonstrates an at least 2.4 x 0.8 cm right inguinal lymph node correlating with the dominant right inguinal lymph node seen on preceding abdominal CT image 90, series 2. The procedure was planned. The right groin was prepped and draped in usual sterile fashion. After the overlying soft tissues anesthetized 1% lidocaine with epinephrine, an 18 gauge core needle biopsy device was utilized to obtain 6 core needle biopsy samples. Multiple ultrasound images were saved procedural documentation purposes. Samples were placed in saline and submitted to the laboratory for pathologic analysis. The patient tolerated the above procedures well without immediate postprocedural complication. FINDINGS: A total of approximately 2 liters of chylous fluid was removed. Samples were sent to the laboratory as requested by the clinical team. Successful ultrasound-guided biopsy dominant right inguinal lymph node IMPRESSION: 1. Successful ultrasound-guided biopsy of dominant right inguinal node. 2. Successful ultrasound-guided paracentesis yielding 2 liters of chylous appearing fluid. Fluid was sent to the laboratory for cytologic analysis as well as triglyceride levels. Electronically Signed   By: Sandi Mariscal M.D.   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 PLAN: -Discussed pt labwork today, 07/21/20; blood counts are holding steady, chemistries are stable, Uric acid looks good, LDH is WNL. -The pt has no prohibitive toxicities from continuing C2 R-CHOP at this time. -Advised pt that fatigue can be cumulative with subsequent treatments.  -Recommended that the pt continue to eat well, drink at least 48-64 oz of water each day, and walk 20-30 minutes each day.  -Advised pt that Oxycodone can cause constipation. Cut down where possible. Pt can also begin Miralax for additional constipation relief.  -Will place pt on a Prednisone taper. Recommend pt take 3 tablets for 3 days, 2 tablets for 2 days, and 1 tablet for 2-3 days.  -Advised pt to take Prednisone for five days per cycle starting with C3. -Discontinue Allopurinol - no evidence of TLS. -Will repeat scans after C3.  -Rx Ambien -Will see back just before C3. Advised pt to contact sooner if any new concerns.   FOLLOW UP: Plz schedule next cycle C3 of R-CHOP in 3 weeks.(11/26) with D3 Carmon Ginsberg, labs, and MD visit on  11/23 (due to PAL)   The total time spent in the appt was 30 minutes and more than 50% was on counseling and direct patient cares, ordering and management of chemotherapy  All of the patient's questions were answered with apparent satisfaction. The patient knows to call the clinic with any problems, questions or concerns.    Sullivan Lone MD [place] AAHIVMS [facility] Hematology/Oncology Physician [facility]  (Office):       [phone] (Work cell):  [phone] (Fax):           [phone]  07/21/2020 10:42 AM  I, Yevette Edwards, am acting as a scribe for Dr. Sullivan Lone.   .I have reviewed the above documentation for accuracy and completeness, and I agree with the above. Brunetta Genera MD

## 2020-07-21 NOTE — Patient Instructions (Signed)
[facility] Discharge Instructions for Patients Receiving Chemotherapy  Today you received the following chemotherapy agents: Rituximab, Cytoxan, Adriamycin, and Vincristine  To help prevent nausea and vomiting after your treatment, we encourage you to take your nausea medication as prescribed.  DO NOT TAKE ZOFRAN (ONDANSETRON) FOR 3 DAYS AFTER CHEMOTHERAPY APPT!   If you develop nausea and vomiting that is not controlled by your nausea medication, call the clinic.   BELOW ARE SYMPTOMS THAT SHOULD BE REPORTED IMMEDIATELY:  *FEVER GREATER THAN 100.5 F  *CHILLS WITH OR WITHOUT FEVER  NAUSEA AND VOMITING THAT IS NOT CONTROLLED WITH YOUR NAUSEA MEDICATION  *UNUSUAL SHORTNESS OF BREATH  *UNUSUAL BRUISING OR BLEEDING  TENDERNESS IN MOUTH AND THROAT WITH OR WITHOUT PRESENCE OF ULCERS  *URINARY PROBLEMS  *BOWEL PROBLEMS  UNUSUAL RASH Items with * indicate a potential emergency and should be followed up as soon as possible.  Feel free to call the clinic should you have any questions or concerns. The clinic phone number is [phone].  Please show the [facility] at check-in to the Emergency Department and triage nurse.

## 2020-07-24 ENCOUNTER — Inpatient Hospital Stay: Payer: 59

## 2020-07-24 ENCOUNTER — Other Ambulatory Visit: Payer: Self-pay

## 2020-07-24 VITALS — BP 115/80 | HR 110 | Temp 99.1°F

## 2020-07-24 DIAGNOSIS — Z7189 Other specified counseling: Secondary | ICD-10-CM

## 2020-07-24 DIAGNOSIS — C8248 Follicular lymphoma grade IIIb, lymph nodes of multiple sites: Secondary | ICD-10-CM

## 2020-07-24 DIAGNOSIS — Z5112 Encounter for antineoplastic immunotherapy: Secondary | ICD-10-CM | POA: Diagnosis not present

## 2020-07-24 MED ORDER — PEGFILGRASTIM-CBQV 6 MG/0.6ML ~~LOC~~ SOSY
PREFILLED_SYRINGE | SUBCUTANEOUS | Status: AC
  Filled 2020-07-24: qty 0.6

## 2020-07-24 MED ORDER — ACETAMINOPHEN 325 MG PO TABS
ORAL_TABLET | ORAL | Status: AC
  Filled 2020-07-24: qty 2

## 2020-07-24 MED ORDER — PEGFILGRASTIM-CBQV 6 MG/0.6ML ~~LOC~~ SOSY
6.0000 mg | PREFILLED_SYRINGE | Freq: Once | SUBCUTANEOUS | Status: AC
  Administered 2020-07-24: 6 mg via SUBCUTANEOUS

## 2020-07-24 MED ORDER — LORATADINE 10 MG PO TABS
ORAL_TABLET | ORAL | Status: AC
  Filled 2020-07-24: qty 1

## 2020-07-25 NOTE — Progress Notes (Signed)
    DATE:  07/21/2020                                          X  CHEMO/IMMUNOTHERAPY REACTION              MD:  Dr. Sullivan Lone   AGENT/BLOOD [place]:              R-CHOP   AGENT/BLOOD PRODUCT RECEIVING IMMEDIATELY PRIOR TO REACTION:          Rituxan   VS: BP:     101/63   P:       124       SPO2:       97 % on room air  R: 16  T: 100.2                  REACTION(S):           dizziness, headache, and tachycardia   PREMEDS:      Aloxi, Tylenol, Benadryl, Dexamethasone, and Emend   INTERVENTION: Rituxan was paused and the patient was given Pepcid 20 mg IV x 1 and Tylenol 325 mg PO x 1   Review of Systems  Review of Systems  Constitutional: Negative for chills, diaphoresis and fever.  HENT: Negative for trouble swallowing and voice change.   Respiratory: Negative for cough, chest tightness, shortness of breath and wheezing.   Cardiovascular: Negative for chest pain and palpitations.  Gastrointestinal: Negative for abdominal pain, constipation, diarrhea, nausea and vomiting.  Musculoskeletal: Negative for back pain and myalgias.  Neurological: Positive for dizziness and headaches. Negative for light-headedness.     Physical Exam  Physical Exam Constitutional:      General: He is not in acute distress.    Appearance: He is not diaphoretic.  HENT:     Head: Normocephalic and atraumatic.  Cardiovascular:     Rate and Rhythm: Regular rhythm. Tachycardia present.     Heart sounds: Normal heart sounds. No murmur heard.  No friction rub. No gallop.   Pulmonary:     Effort: Pulmonary effort is normal. No respiratory distress.     Breath sounds: Normal breath sounds. No wheezing or rales.  Skin:    General: Skin is warm and dry.     Findings: No erythema or rash.  Neurological:     Mental Status: He is alert.     OUTCOME:                 Rituxan was restarted and completed after the patient's symptoms abated.   Sandi Mealy, MHS, PA-C

## 2020-07-26 DIAGNOSIS — F332 Major depressive disorder, recurrent severe without psychotic features: Secondary | ICD-10-CM | POA: Diagnosis not present

## 2020-07-26 DIAGNOSIS — F900 Attention-deficit hyperactivity disorder, predominantly inattentive type: Secondary | ICD-10-CM | POA: Diagnosis not present

## 2020-07-26 DIAGNOSIS — F411 Generalized anxiety disorder: Secondary | ICD-10-CM | POA: Diagnosis not present

## 2020-07-26 DIAGNOSIS — Z79899 Other long term (current) drug therapy: Secondary | ICD-10-CM | POA: Diagnosis not present

## 2020-07-26 DIAGNOSIS — R69 Illness, unspecified: Secondary | ICD-10-CM | POA: Diagnosis not present

## 2020-07-26 DIAGNOSIS — C8590 Non-Hodgkin lymphoma, unspecified, unspecified site: Secondary | ICD-10-CM | POA: Diagnosis not present

## 2020-08-02 DIAGNOSIS — R69 Illness, unspecified: Secondary | ICD-10-CM | POA: Diagnosis not present

## 2020-08-02 DIAGNOSIS — F411 Generalized anxiety disorder: Secondary | ICD-10-CM | POA: Diagnosis not present

## 2020-08-02 DIAGNOSIS — F900 Attention-deficit hyperactivity disorder, predominantly inattentive type: Secondary | ICD-10-CM | POA: Diagnosis not present

## 2020-08-08 ENCOUNTER — Inpatient Hospital Stay: Payer: 59

## 2020-08-08 ENCOUNTER — Other Ambulatory Visit: Payer: Self-pay | Admitting: *Deleted

## 2020-08-08 ENCOUNTER — Other Ambulatory Visit: Payer: Self-pay

## 2020-08-08 ENCOUNTER — Inpatient Hospital Stay: Payer: 59 | Admitting: Hematology

## 2020-08-08 VITALS — BP 106/70 | HR 18 | Temp 97.2°F | Resp 20 | Ht 73.0 in | Wt 171.8 lb

## 2020-08-08 DIAGNOSIS — Z95828 Presence of other vascular implants and grafts: Secondary | ICD-10-CM

## 2020-08-08 DIAGNOSIS — Z5112 Encounter for antineoplastic immunotherapy: Secondary | ICD-10-CM | POA: Diagnosis not present

## 2020-08-08 DIAGNOSIS — Z5111 Encounter for antineoplastic chemotherapy: Secondary | ICD-10-CM | POA: Diagnosis not present

## 2020-08-08 DIAGNOSIS — C8248 Follicular lymphoma grade IIIb, lymph nodes of multiple sites: Secondary | ICD-10-CM

## 2020-08-08 LAB — CMP (CANCER CENTER ONLY)
ALT: 16 U/L (ref 0–44)
AST: 16 U/L (ref 15–41)
Albumin: 3.6 g/dL (ref 3.5–5.0)
Alkaline Phosphatase: 108 U/L (ref 38–126)
Anion gap: 10 (ref 5–15)
BUN: 15 mg/dL (ref 8–23)
CO2: 24 mmol/L (ref 22–32)
Calcium: 9 mg/dL (ref 8.9–10.3)
Chloride: 110 mmol/L (ref 98–111)
Creatinine: 0.79 mg/dL (ref 0.61–1.24)
GFR, Estimated: >60 mL/min (ref >60)
Glucose, Bld: 112 mg/dL — ABNORMAL HIGH (ref 70–99)
Potassium: 3.8 mmol/L (ref 3.5–5.1)
Sodium: 144 mmol/L (ref 135–145)
Total Bilirubin: 0.3 mg/dL (ref 0.3–1.2)
Total Protein: 5.9 g/dL — ABNORMAL LOW (ref 6.5–8.1)

## 2020-08-08 LAB — CBC WITH DIFFERENTIAL (CANCER CENTER ONLY)
Abs Immature Granulocytes: 0.08 10*3/uL — ABNORMAL HIGH (ref 0.00–0.07)
Basophils Absolute: 0.1 10*3/uL (ref 0.0–0.1)
Basophils Relative: 1 %
Eosinophils Absolute: 0 10*3/uL (ref 0.0–0.5)
Eosinophils Relative: 0 %
HCT: 34.8 % — ABNORMAL LOW (ref 39.0–52.0)
Hemoglobin: 11.3 g/dL — ABNORMAL LOW (ref 13.0–17.0)
Immature Granulocytes: 1 %
Lymphocytes Relative: 8 %
Lymphs Abs: 0.6 10*3/uL — ABNORMAL LOW (ref 0.7–4.0)
MCH: 27.8 pg (ref 26.0–34.0)
MCHC: 32.5 g/dL (ref 30.0–36.0)
MCV: 85.7 fL (ref 80.0–100.0)
Monocytes Absolute: 0.7 10*3/uL (ref 0.1–1.0)
Monocytes Relative: 9 %
Neutro Abs: 6.3 10*3/uL (ref 1.7–7.7)
Neutrophils Relative %: 81 %
Platelet Count: 251 10*3/uL (ref 150–400)
RBC: 4.06 MIL/uL — ABNORMAL LOW (ref 4.22–5.81)
RDW: 16.9 % — ABNORMAL HIGH (ref 11.5–15.5)
WBC Count: 7.8 10*3/uL (ref 4.0–10.5)
nRBC: 0 % (ref 0.0–0.2)

## 2020-08-08 LAB — LACTATE DEHYDROGENASE: LDH: 209 U/L — ABNORMAL HIGH (ref 98–192)

## 2020-08-08 LAB — URIC ACID: Uric Acid, Serum: 4.9 mg/dL (ref 3.7–8.6)

## 2020-08-08 MED ORDER — SODIUM CHLORIDE 0.9% FLUSH
10.0000 mL | INTRAVENOUS | Status: DC | PRN
  Administered 2020-08-08: 10 mL
  Filled 2020-08-08: qty 10

## 2020-08-08 MED ORDER — HEPARIN SOD (PORK) LOCK FLUSH 100 UNIT/ML IV SOLN
500.0000 [IU] | Freq: Once | INTRAVENOUS | Status: AC | PRN
  Administered 2020-08-08: 500 [IU]
  Filled 2020-08-08: qty 5

## 2020-08-08 NOTE — Progress Notes (Signed)
HEMATOLOGY/ONCOLOGY CLINIC NOTE  Date of Service: 08/08/2020  Patient Care Team: [name], MD as PCP - General (Internal Medicine) Debara Pickett Nadean Corwin, MD as PCP - Cardiology (Cardiology) [name], MD (Internal Medicine)  CHIEF COMPLAINTS/PURPOSE OF CONSULTATION:  Mx of high grade follicular lymphoma  HISTORY OF PRESENTING ILLNESS:   George Proctor is a wonderful 64 y.o. male who has been referred to [place] by Dr. Shelia Media for evaluation and management of possible wide-spread lymphoma - palpable mass in abdomen. Pt is accompanied today by his wife. The pt reports that he is doing well overall.   The pt reports that about three weeks ago he began experiencing abdominal fullness. Two weeks ago he began to feel abdominal pain. Lying down makes this discomfort worse. He has not had a restful sleep in two weeks. The abdominal fullness has caused lack of appetite, belching, and one episode of nausea and vomiting. He is currently eating 1/3-1/2 of his baseline. Pt has been taking Tramadol to control his pain, as well as a stool softener. His bowel movements look different, but he denies any constipation. He is hot natured and has mild night sweats, but denies drenching night sweats. Pt feels that he has been short of breath for awhile, but that this may have worsened lately. Pt follows with Dr. Benson Norway, GI.   He had abdominal surgery as a child after an accident. Pt has had GI bleeding and ulcers from taking NSAIDS. Pt has had several instances of abdominal swelling that were triggered by illness. Pt had esophageal dialation. His strictures were thought to be caused by acid reflux. He also has an umbilical hernia. He has chronic back pain. Pt was diagnosed with chronic lyme disease by a [place]. He has a history of balance issues and has brought this up to his PCP, who did not feel that it required a work up. He does not have a true allergy, but experiences nausea and vomiting when  taking Omeprazole. Pt denies any international travel in the last year.   Pt has been taking weekly Testosterone for 3-4 months. He was experiencing fatigue prior to beginning the hormonal injections. Pt was placed on Latuda & Lamictil several weeks ago for his Depression and Anxiety, but discontinued a week ago as he did not see any improvement in mood. He is not currently following with a mental health professional.   Dr. Shelia Media referred pt to Dr. Redmond Pulling at [facility] for biopsy consideration. Pt is scheduled to see Dr. Shelia Media next Wednesday.   Of note prior to the patient's visit today, pt has had  CT Abd/Pel completed on 06/05/2020 with results revealing numerous cardiophrenic, retroperitoneal and mesenteric adenopathy most consistent with lymphoma. Small volume of ascites. Hepatosplenomegaly.  Most recent lab results (03/22/2020) of CBC is as follows: all values are WNL except for WBC at 3.5K, PLT at 131K, Lymphs Rel at 17.6, Total Protein at 6.3.  On review of systems, pt reports worsening SOB, abdominal pain, abdominal fullness, low appetite, dyspepsia, mild night sweats and denies fevers, chills, rash, headaches, leg swelling, urinary habit changes, constipation, testicular pain/swelling and any other symptoms.   On PMHx the pt reports Depression, Anxiety, Abdominal Surgery, Gait difficulty, Esophageal dilation. On Family Hx the pt reports that his father and paternal aunt had polymyalgia rheumatica. His paternal aunt also had breast cancer.  INTERVAL HISTORY:  George Proctor is a wonderful 64 y.o. male who is here for evaluation and management of  lymphoma. He is here prior to C3 of R-CHOP. The patient's last visit with [place] was on 07/21/2020. The pt reports that he is doing well overall.  The pt reports that he has been feeling well. The pt has some nausea and fatigue the week after treatment, but these symptoms have resolved. He is 10-15 lbs under his baseline. He has remained  active by working in his yard and around his home. Pt is doing well emotionally.  Lab results today (08/08/20) of CBC w/diff and CMP is as follows: all values are WNL except for RBC at 4.06, Hgb at 11.3, HCT at 34.8, RDW at 16.9, Lymphs Abs at 0.6K, Abs Immature Granulocytes at 0.08K, Glucose at 112, Total Protein at 5.9. 08/08/2020 LDH at 209 08/08/2020 Uric acid at 4.9  On review of systems, pt reports weight loss and denies nausea, fatigue, abdominal pain, leg swelling, abdominal distension, SOB, pain at port site, back pain and any other symptoms.   MEDICAL HISTORY:  Past Medical History:  Diagnosis Date  . Anxiety   . Depression   . Gait difficulty   . Lymphoma ([place])   . Memory loss    SURGICAL HISTORY: Past Surgical History:  Procedure Laterality Date  . ABDOMINAL SURGERY     Childhood  . BUNIONECTOMY    . IR IMAGING GUIDED PORT INSERTION  06/30/2020  . IR PARACENTESIS  06/26/2020  . IR US GUIDE BX ASP/DRAIN  06/26/2020    SOCIAL HISTORY: Social History   Socioeconomic History  . Marital status: Married    Spouse name: Not on file  . Number of children: 0  . Years of education: Veterinary surgeon  . Highest education level: Not on file  Occupational History  . Occupation: Scientist, research (physical sciences)  Tobacco Use  . Smoking status: Never Smoker  . Smokeless tobacco: Never Used  Vaping Use  . Vaping Use: Never used  Substance and Sexual Activity  . Alcohol use: No    Alcohol/week: 0.0 standard drinks    Comment: Less than one drink per week.  . Drug use: No  . Sexual activity: Not Currently  Other Topics Concern  . Not on file  Social History Narrative   Lives at home with wife.   Right-handed.   3 cups caffeine per day.   Social Determinants of Health   Financial Resource Strain:   . Difficulty of Paying Living Expenses: Not on file  Food Insecurity:   . Worried About Charity fundraiser in the Last Year: Not on file  . Ran Out of Food in the Last  Year: Not on file  Transportation Needs:   . Lack of Transportation (Medical): Not on file  . Lack of Transportation (Non-Medical): Not on file  Physical Activity:   . Days of Exercise per Week: Not on file  . Minutes of Exercise per Session: Not on file  Stress:   . Feeling of Stress : Not on file  Social Connections:   . Frequency of Communication with Friends and Family: Not on file  . Frequency of Social Gatherings with Friends and Family: Not on file  . Attends Religious Services: Not on file  . Active Member of Clubs or Organizations: Not on file  . Attends Archivist Meetings: Not on file  . Marital Status: Not on file  Intimate Partner Violence:   . Fear of Current or Ex-Partner: Not on file  . Emotionally Abused: Not on file  . Physically Abused: Not  on file  . Sexually Abused: Not on file    FAMILY HISTORY: Family History  Problem Relation Age of Onset  . Aneurysm Mother 75       Thoracic  . Hypertension Father   . Heart failure Father   . Heart disease Father   . Heart attack Paternal Grandfather   . Depression Cousin   . Bipolar disorder Cousin   . Schizophrenia Maternal Uncle     ALLERGIES:  is allergic to omeprazole.  MEDICATIONS:  Current Outpatient Medications  Medication Sig Dispense Refill  . allopurinol (ZYLOPRIM) 100 MG tablet Take 1 tablet (100 mg total) by mouth 2 (two) times daily. 60 tablet 2  . Ascorbic Acid (VITAMIN C) 1000 MG tablet Take 1,000 mg by mouth daily.    . B Complex Vitamins (VITAMIN B COMPLEX PO) Take 1 tablet by mouth daily.     Marland Kitchen lidocaine-prilocaine (EMLA) cream Apply to affected area once 30 g 3  . LORazepam (ATIVAN) 0.5 MG tablet Take 1 tablet (0.5 mg total) by mouth every 6 (six) hours as needed (Nausea or vomiting). 30 tablet 0  . Multiple Vitamin (MULTIVITAMIN) capsule Take 1 capsule by mouth daily.    . ondansetron (ZOFRAN) 8 MG tablet Take 1 tablet (8 mg total) by mouth every 8 (eight) hours as needed for  vomiting or refractory nausea / vomiting. Start on day 3 after cyclophosphamide chemotherapy. 30 tablet 1  . oxyCODONE (OXY IR/ROXICODONE) 5 MG immediate release tablet Take 1 tablet (5 mg total) by mouth every 4 (four) hours as needed for severe pain. 30 tablet 0  . pantoprazole (PROTONIX) 40 MG tablet Take 40 mg by mouth 2 (two) times daily as needed (indigestion).     . polyethylene glycol (MIRALAX) 17 g packet Take 17 g by mouth daily. 30 each 1  . predniSONE (DELTASONE) 20 MG tablet Prednisone 60 mg po daily for 5 days with each cycle of R-CHOP. Take with food. 15 tablet 3  . prochlorperazine (COMPAZINE) 10 MG tablet Take 1 tablet (10 mg total) by mouth every 6 (six) hours as needed (Nausea or vomiting). 30 tablet 6  . senna-docusate (SENNA S) 8.6-50 MG tablet Take 2 tablets by mouth at bedtime. 60 tablet 1  . testosterone cypionate (DEPOTESTOSTERONE CYPIONATE) 200 MG/ML injection Inject into the muscle once a week.     . zolpidem (AMBIEN CR) 12.5 MG CR tablet Take 1 tablet (12.5 mg total) by mouth at bedtime as needed for sleep. 30 tablet 0   No current facility-administered medications for this visit.    REVIEW OF SYSTEMS:   A 10+ POINT REVIEW OF SYSTEMS WAS OBTAINED including neurology, dermatology, psychiatry, cardiac, respiratory, lymph, extremities, GI, GU, Musculoskeletal, constitutional, breasts, reproductive, HEENT.  All pertinent positives are noted in the HPI.  All others are negative.    PHYSICAL EXAMINATION: ECOG PERFORMANCE STATUS: 2 - Symptomatic, <50% confined to bed  . Vitals:   08/08/20 1228  BP: 106/70  Pulse: (!) 18  Resp: 20  Temp: (!) 97.2 F (36.2 C)  SpO2: 98%   Filed Weights   08/08/20 1228  Weight: 171 lb 12.8 oz (77.9 kg)   .Body mass index is 22.67 kg/m.  GENERAL:alert, in no acute distress and comfortable SKIN: no acute rashes, no significant lesions EYES: conjunctiva are pink and non-injected, sclera anicteric OROPHARYNX: MMM, no exudates, no  oropharyngeal erythema or ulceration NECK: supple, no JVD LYMPH:  no palpable lymphadenopathy in the cervical, axillary or inguinal regions LUNGS:  clear to auscultation b/l with normal respiratory effort HEART: regular rate & rhythm ABDOMEN:  normoactive bowel sounds , non tender, not distended. No palpable hepatosplenomegaly.  Extremity: no pedal edema PSYCH: alert & oriented x 3 with fluent speech NEURO: no focal motor/sensory deficits  LABORATORY DATA:  I have reviewed the data as listed  . CBC Latest Ref Rng & Units 08/08/2020 07/21/2020 07/14/2020  WBC 4.0 - 10.5 K/uL 7.8 14.4(H) 17.0(H)  Hemoglobin 13.0 - 17.0 g/dL 11.3(L) 13.8 13.8  Hematocrit 39 - 52 % 34.8(L) 42.4 42.1  Platelets 150 - 400 K/uL 251 243 178    . CMP Latest Ref Rng & Units 08/08/2020 07/21/2020 07/14/2020  Glucose 70 - 99 mg/dL 112(H) 100(H) 140(H)  BUN 8 - 23 mg/dL 15 16 17   Creatinine 0.61 - 1.24 mg/dL 0.79 0.80 0.87  Sodium 135 - 145 mmol/L 144 138 139  Potassium 3.5 - 5.1 mmol/L 3.8 4.5 4.1  Chloride 98 - 111 mmol/L 110 104 103  CO2 22 - 32 mmol/L 24 29 27   Calcium 8.9 - 10.3 mg/dL 9.0 9.0 9.2  Total Protein 6.5 - 8.1 g/dL 5.9(L) 5.7(L) 5.9(L)  Total Bilirubin 0.3 - 1.2 mg/dL 0.3 0.6 0.3  Alkaline Phos 38 - 126 U/L 108 93 140(H)  AST 15 - 41 U/L 16 15 22   ALT 0 - 44 U/L 16 21 37   . Lab Results  Component Value Date   LDH 209 (H) 08/08/2020   Ric acid 3.9  RADIOGRAPHIC STUDIES: I have personally reviewed the radiological images as listed and agreed with the findings in the report. No results found.     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 PLAN: -Discussed pt labwork today, 08/08/20; WBC & PLT are nml, Hgb is stable, blood chemistries are steady, LDH is borderline high, Uric acid is WNL.  -The pt has no prohibitive toxicities from continuing  C3 R-CHOP at this time. -Advised pt that we would not proceed with significant elective dental procedures during active treatment.  -Advised pt that he will need prophylactic antibiotics for any dental procedures due to his port. -Advised pt that infections will delay treatment.  -Will repeat PET/CT in 2 weeks. -Will see back in 3 weeks with C4D1.   FOLLOW UP: F/u for C3 of R-CHOP and G-CSF shot as scheduled  PET/CT in 2 weeks PLZ schedule C4 of R-CHOP as per orders, portflush,labs and MD visit on C4D1.   The total time spent in the appt was 30 minutes and more than 50% was on counseling and direct patient cares, ordering and management of chemotherapy.  All of the patient's questions were answered with apparent satisfaction. The patient knows to call the clinic with any problems, questions or concerns.    Sullivan Lone MD [place] AAHIVMS [facility] [place] [facility] Hematology/Oncology Physician [facility]  (Office):       [phone] (Work cell):  [phone] (Fax):           [phone]  08/08/2020 1:24 PM  I, Yevette Edwards, am acting as a scribe for Dr. Sullivan Lone.   .I have reviewed the above documentation for accuracy and completeness, and I agree with the above. Brunetta Genera MD

## 2020-08-11 ENCOUNTER — Inpatient Hospital Stay: Payer: 59

## 2020-08-11 ENCOUNTER — Other Ambulatory Visit: Payer: Self-pay

## 2020-08-11 VITALS — BP 116/71 | HR 100 | Temp 98.9°F | Resp 17

## 2020-08-11 DIAGNOSIS — Z5112 Encounter for antineoplastic immunotherapy: Secondary | ICD-10-CM | POA: Diagnosis not present

## 2020-08-11 DIAGNOSIS — C8248 Follicular lymphoma grade IIIb, lymph nodes of multiple sites: Secondary | ICD-10-CM

## 2020-08-11 DIAGNOSIS — Z7189 Other specified counseling: Secondary | ICD-10-CM

## 2020-08-11 MED ORDER — DIPHENHYDRAMINE HCL 25 MG PO CAPS
50.0000 mg | ORAL_CAPSULE | Freq: Once | ORAL | Status: AC
  Administered 2020-08-11: 50 mg via ORAL

## 2020-08-11 MED ORDER — DIPHENHYDRAMINE HCL 25 MG PO CAPS
ORAL_CAPSULE | ORAL | Status: AC
  Filled 2020-08-11: qty 2

## 2020-08-11 MED ORDER — FAMOTIDINE IN NACL 20-0.9 MG/50ML-% IV SOLN
20.0000 mg | Freq: Once | INTRAVENOUS | Status: AC
  Administered 2020-08-11: 20 mg via INTRAVENOUS

## 2020-08-11 MED ORDER — HEPARIN SOD (PORK) LOCK FLUSH 100 UNIT/ML IV SOLN
500.0000 [IU] | Freq: Once | INTRAVENOUS | Status: AC | PRN
  Administered 2020-08-11: 500 [IU]
  Filled 2020-08-11: qty 5

## 2020-08-11 MED ORDER — SODIUM CHLORIDE 0.9% FLUSH
10.0000 mL | INTRAVENOUS | Status: DC | PRN
  Administered 2020-08-11: 10 mL
  Filled 2020-08-11: qty 10

## 2020-08-11 MED ORDER — PALONOSETRON HCL INJECTION 0.25 MG/5ML
0.2500 mg | Freq: Once | INTRAVENOUS | Status: AC
  Administered 2020-08-11: 0.25 mg via INTRAVENOUS

## 2020-08-11 MED ORDER — FAMOTIDINE IN NACL 20-0.9 MG/50ML-% IV SOLN
INTRAVENOUS | Status: AC
  Filled 2020-08-11: qty 50

## 2020-08-11 MED ORDER — ACETAMINOPHEN 325 MG PO TABS
650.0000 mg | ORAL_TABLET | Freq: Once | ORAL | Status: AC
  Administered 2020-08-11: 650 mg via ORAL

## 2020-08-11 MED ORDER — ACETAMINOPHEN 325 MG PO TABS
ORAL_TABLET | ORAL | Status: AC
  Filled 2020-08-11: qty 2

## 2020-08-11 MED ORDER — SODIUM CHLORIDE 0.9 % IV SOLN
10.0000 mg | Freq: Once | INTRAVENOUS | Status: AC
  Administered 2020-08-11: 10 mg via INTRAVENOUS
  Filled 2020-08-11: qty 10

## 2020-08-11 MED ORDER — SODIUM CHLORIDE 0.9 % IV SOLN
Freq: Once | INTRAVENOUS | Status: AC
  Filled 2020-08-11: qty 250

## 2020-08-11 MED ORDER — PALONOSETRON HCL INJECTION 0.25 MG/5ML
INTRAVENOUS | Status: AC
  Filled 2020-08-11: qty 5

## 2020-08-11 MED ORDER — DOXORUBICIN HCL CHEMO IV INJECTION 2 MG/ML
50.0000 mg/m2 | Freq: Once | INTRAVENOUS | Status: AC
  Administered 2020-08-11: 106 mg via INTRAVENOUS
  Filled 2020-08-11: qty 53

## 2020-08-11 MED ORDER — SODIUM CHLORIDE 0.9 % IV SOLN
375.0000 mg/m2 | Freq: Once | INTRAVENOUS | Status: AC
  Administered 2020-08-11: 800 mg via INTRAVENOUS
  Filled 2020-08-11: qty 50

## 2020-08-11 MED ORDER — SODIUM CHLORIDE 0.9 % IV SOLN
750.0000 mg/m2 | Freq: Once | INTRAVENOUS | Status: AC
  Administered 2020-08-11: 1580 mg via INTRAVENOUS
  Filled 2020-08-11: qty 79

## 2020-08-11 MED ORDER — VINCRISTINE SULFATE CHEMO INJECTION 1 MG/ML
2.0000 mg | Freq: Once | INTRAVENOUS | Status: AC
  Administered 2020-08-11: 2 mg via INTRAVENOUS
  Filled 2020-08-11: qty 2

## 2020-08-11 MED ORDER — SODIUM CHLORIDE 0.9 % IV SOLN
150.0000 mg | Freq: Once | INTRAVENOUS | Status: AC
  Administered 2020-08-11: 150 mg via INTRAVENOUS
  Filled 2020-08-11: qty 150

## 2020-08-11 NOTE — Patient Instructions (Signed)
[place] Discharge Instructions for Patients Receiving Chemotherapy  Today you received the following chemotherapy agents: Adriamycin, vincristine, cytoxan, rituximab  To help prevent nausea and vomiting after your treatment, we encourage you to take your nausea medication as directed.   If you develop nausea and vomiting that is not controlled by your nausea medication, call the clinic.   BELOW ARE SYMPTOMS THAT SHOULD BE REPORTED IMMEDIATELY:  *FEVER GREATER THAN 100.5 F  *CHILLS WITH OR WITHOUT FEVER  NAUSEA AND VOMITING THAT IS NOT CONTROLLED WITH YOUR NAUSEA MEDICATION  *UNUSUAL SHORTNESS OF BREATH  *UNUSUAL BRUISING OR BLEEDING  TENDERNESS IN MOUTH AND THROAT WITH OR WITHOUT PRESENCE OF ULCERS  *URINARY PROBLEMS  *BOWEL PROBLEMS  UNUSUAL RASH Items with * indicate a potential emergency and should be followed up as soon as possible.  Feel free to call the clinic should you have any questions or concerns. The clinic phone number is [phone].  Please show the [place] at check-in to the Emergency Department and triage nurse.

## 2020-08-14 ENCOUNTER — Other Ambulatory Visit: Payer: Self-pay

## 2020-08-14 ENCOUNTER — Inpatient Hospital Stay: Payer: 59

## 2020-08-14 VITALS — BP 114/75 | HR 105 | Resp 18

## 2020-08-14 DIAGNOSIS — Z7189 Other specified counseling: Secondary | ICD-10-CM

## 2020-08-14 DIAGNOSIS — Z5112 Encounter for antineoplastic immunotherapy: Secondary | ICD-10-CM | POA: Diagnosis not present

## 2020-08-14 DIAGNOSIS — C8248 Follicular lymphoma grade IIIb, lymph nodes of multiple sites: Secondary | ICD-10-CM

## 2020-08-14 MED ORDER — PEGFILGRASTIM-CBQV 6 MG/0.6ML ~~LOC~~ SOSY
PREFILLED_SYRINGE | SUBCUTANEOUS | Status: AC
  Filled 2020-08-14: qty 0.6

## 2020-08-14 MED ORDER — PEGFILGRASTIM-CBQV 6 MG/0.6ML ~~LOC~~ SOSY
6.0000 mg | PREFILLED_SYRINGE | Freq: Once | SUBCUTANEOUS | Status: AC
  Administered 2020-08-14: 6 mg via SUBCUTANEOUS

## 2020-08-14 NOTE — Patient Instructions (Signed)

## 2020-08-15 ENCOUNTER — Telehealth: Payer: Self-pay | Admitting: *Deleted

## 2020-08-15 ENCOUNTER — Other Ambulatory Visit: Payer: Self-pay | Admitting: *Deleted

## 2020-08-15 DIAGNOSIS — F900 Attention-deficit hyperactivity disorder, predominantly inattentive type: Secondary | ICD-10-CM | POA: Diagnosis not present

## 2020-08-15 DIAGNOSIS — C8590 Non-Hodgkin lymphoma, unspecified, unspecified site: Secondary | ICD-10-CM | POA: Diagnosis not present

## 2020-08-15 DIAGNOSIS — R69 Illness, unspecified: Secondary | ICD-10-CM | POA: Diagnosis not present

## 2020-08-15 DIAGNOSIS — F411 Generalized anxiety disorder: Secondary | ICD-10-CM | POA: Diagnosis not present

## 2020-08-15 DIAGNOSIS — C8248 Follicular lymphoma grade IIIb, lymph nodes of multiple sites: Secondary | ICD-10-CM

## 2020-08-15 DIAGNOSIS — Z79899 Other long term (current) drug therapy: Secondary | ICD-10-CM | POA: Diagnosis not present

## 2020-08-15 NOTE — Telephone Encounter (Signed)
Patient had chemo on Friday and Udenyca on Monday. He said his education class said he should report the following side effects: urinary frequency with small voiding amounts; bruising left wrist/left ankle; fatigue. He wanted to know if any of these needed follow up. Dr. [name] informed.  Per Dr. [name] - he could come in to check cbc/diff and cmp to r/o thrombocytopenia as an etiology for bruising and UA/UCx to r/o UTI if urinary symptoms persistent. Patient in agreement. Labs ordered. Schedule message sent for appt for lab and port flush with lab.

## 2020-08-16 ENCOUNTER — Other Ambulatory Visit: Payer: Self-pay

## 2020-08-16 ENCOUNTER — Inpatient Hospital Stay: Payer: 59

## 2020-08-16 ENCOUNTER — Inpatient Hospital Stay: Payer: 59 | Attending: Hematology

## 2020-08-16 DIAGNOSIS — C8248 Follicular lymphoma grade IIIb, lymph nodes of multiple sites: Secondary | ICD-10-CM | POA: Diagnosis present

## 2020-08-16 DIAGNOSIS — Z95828 Presence of other vascular implants and grafts: Secondary | ICD-10-CM

## 2020-08-16 DIAGNOSIS — Z5112 Encounter for antineoplastic immunotherapy: Secondary | ICD-10-CM | POA: Insufficient documentation

## 2020-08-16 DIAGNOSIS — Z452 Encounter for adjustment and management of vascular access device: Secondary | ICD-10-CM | POA: Insufficient documentation

## 2020-08-16 DIAGNOSIS — Z5189 Encounter for other specified aftercare: Secondary | ICD-10-CM | POA: Insufficient documentation

## 2020-08-16 DIAGNOSIS — Z5111 Encounter for antineoplastic chemotherapy: Secondary | ICD-10-CM | POA: Diagnosis present

## 2020-08-16 LAB — CBC WITH DIFFERENTIAL (CANCER CENTER ONLY)
Abs Immature Granulocytes: 0 10*3/uL (ref 0.00–0.07)
Band Neutrophils: 7 %
Basophils Absolute: 0 10*3/uL (ref 0.0–0.1)
Basophils Relative: 0 %
Eosinophils Absolute: 0 10*3/uL (ref 0.0–0.5)
Eosinophils Relative: 0 %
HCT: 33.7 % — ABNORMAL LOW (ref 39.0–52.0)
Hemoglobin: 11 g/dL — ABNORMAL LOW (ref 13.0–17.0)
Lymphocytes Relative: 1 %
Lymphs Abs: 0.4 10*3/uL — ABNORMAL LOW (ref 0.7–4.0)
MCH: 28.4 pg (ref 26.0–34.0)
MCHC: 32.6 g/dL (ref 30.0–36.0)
MCV: 87.1 fL (ref 80.0–100.0)
Monocytes Absolute: 0 10*3/uL — ABNORMAL LOW (ref 0.1–1.0)
Monocytes Relative: 0 %
Neutro Abs: 41.4 10*3/uL — ABNORMAL HIGH (ref 1.7–7.7)
Neutrophils Relative %: 92 %
Platelet Count: 237 10*3/uL (ref 150–400)
RBC: 3.87 MIL/uL — ABNORMAL LOW (ref 4.22–5.81)
RDW: 17.6 % — ABNORMAL HIGH (ref 11.5–15.5)
WBC Count: 41.8 10*3/uL — ABNORMAL HIGH (ref 4.0–10.5)
nRBC: 0 % (ref 0.0–0.2)

## 2020-08-16 LAB — URINALYSIS, COMPLETE (UACMP) WITH MICROSCOPIC
Bilirubin Urine: NEGATIVE
Glucose, UA: NEGATIVE mg/dL
Hgb urine dipstick: NEGATIVE
Ketones, ur: NEGATIVE mg/dL
Leukocytes,Ua: NEGATIVE
Nitrite: NEGATIVE
Protein, ur: NEGATIVE mg/dL
Specific Gravity, Urine: 1.005 (ref 1.005–1.030)
pH: 7 (ref 5.0–8.0)

## 2020-08-16 LAB — CMP (CANCER CENTER ONLY)
ALT: 13 U/L (ref 0–44)
AST: 12 U/L — ABNORMAL LOW (ref 15–41)
Albumin: 3.8 g/dL (ref 3.5–5.0)
Alkaline Phosphatase: 114 U/L (ref 38–126)
Anion gap: 10 (ref 5–15)
BUN: 20 mg/dL (ref 8–23)
CO2: 23 mmol/L (ref 22–32)
Calcium: 9.3 mg/dL (ref 8.9–10.3)
Chloride: 106 mmol/L (ref 98–111)
Creatinine: 0.75 mg/dL (ref 0.61–1.24)
GFR, Estimated: >60 mL/min (ref >60)
Glucose, Bld: 136 mg/dL — ABNORMAL HIGH (ref 70–99)
Potassium: 4.2 mmol/L (ref 3.5–5.1)
Sodium: 139 mmol/L (ref 135–145)
Total Bilirubin: 0.4 mg/dL (ref 0.3–1.2)
Total Protein: 5.8 g/dL — ABNORMAL LOW (ref 6.5–8.1)

## 2020-08-16 MED ORDER — HEPARIN SOD (PORK) LOCK FLUSH 100 UNIT/ML IV SOLN
500.0000 [IU] | Freq: Once | INTRAVENOUS | Status: AC | PRN
  Administered 2020-08-16: 500 [IU]
  Filled 2020-08-16: qty 5

## 2020-08-16 MED ORDER — SODIUM CHLORIDE 0.9% FLUSH
10.0000 mL | INTRAVENOUS | Status: DC | PRN
  Administered 2020-08-16: 10 mL
  Filled 2020-08-16: qty 10

## 2020-08-16 NOTE — Patient Instructions (Signed)

## 2020-08-17 DIAGNOSIS — C8590 Non-Hodgkin lymphoma, unspecified, unspecified site: Secondary | ICD-10-CM | POA: Diagnosis not present

## 2020-08-17 DIAGNOSIS — Z79899 Other long term (current) drug therapy: Secondary | ICD-10-CM | POA: Diagnosis not present

## 2020-08-17 DIAGNOSIS — F411 Generalized anxiety disorder: Secondary | ICD-10-CM | POA: Diagnosis not present

## 2020-08-17 DIAGNOSIS — F900 Attention-deficit hyperactivity disorder, predominantly inattentive type: Secondary | ICD-10-CM | POA: Diagnosis not present

## 2020-08-17 DIAGNOSIS — R69 Illness, unspecified: Secondary | ICD-10-CM | POA: Diagnosis not present

## 2020-08-17 LAB — URINE CULTURE: Culture: NO GROWTH

## 2020-08-23 ENCOUNTER — Ambulatory Visit (HOSPITAL_COMMUNITY): Payer: 59

## 2020-08-24 DIAGNOSIS — F900 Attention-deficit hyperactivity disorder, predominantly inattentive type: Secondary | ICD-10-CM | POA: Diagnosis not present

## 2020-08-24 DIAGNOSIS — C8590 Non-Hodgkin lymphoma, unspecified, unspecified site: Secondary | ICD-10-CM | POA: Diagnosis not present

## 2020-08-24 DIAGNOSIS — F411 Generalized anxiety disorder: Secondary | ICD-10-CM | POA: Diagnosis not present

## 2020-08-24 DIAGNOSIS — R69 Illness, unspecified: Secondary | ICD-10-CM | POA: Diagnosis not present

## 2020-08-24 DIAGNOSIS — Z79899 Other long term (current) drug therapy: Secondary | ICD-10-CM | POA: Diagnosis not present

## 2020-08-25 ENCOUNTER — Telehealth: Payer: Self-pay | Admitting: Hematology

## 2020-08-25 NOTE — Telephone Encounter (Signed)
Scheduled per los, patient has been called and notified. 

## 2020-08-30 DIAGNOSIS — R69 Illness, unspecified: Secondary | ICD-10-CM | POA: Diagnosis not present

## 2020-08-30 DIAGNOSIS — Z79899 Other long term (current) drug therapy: Secondary | ICD-10-CM | POA: Diagnosis not present

## 2020-08-30 DIAGNOSIS — C8590 Non-Hodgkin lymphoma, unspecified, unspecified site: Secondary | ICD-10-CM | POA: Diagnosis not present

## 2020-08-30 DIAGNOSIS — F900 Attention-deficit hyperactivity disorder, predominantly inattentive type: Secondary | ICD-10-CM | POA: Diagnosis not present

## 2020-08-30 DIAGNOSIS — F411 Generalized anxiety disorder: Secondary | ICD-10-CM | POA: Diagnosis not present

## 2020-08-31 ENCOUNTER — Inpatient Hospital Stay: Payer: 59

## 2020-08-31 ENCOUNTER — Other Ambulatory Visit: Payer: Self-pay

## 2020-08-31 ENCOUNTER — Inpatient Hospital Stay: Payer: 59 | Admitting: Hematology

## 2020-08-31 VITALS — BP 112/74 | HR 95 | Temp 98.4°F | Resp 16 | Wt 172.5 lb

## 2020-08-31 DIAGNOSIS — C8248 Follicular lymphoma grade IIIb, lymph nodes of multiple sites: Secondary | ICD-10-CM | POA: Diagnosis not present

## 2020-08-31 DIAGNOSIS — Z5111 Encounter for antineoplastic chemotherapy: Secondary | ICD-10-CM | POA: Diagnosis not present

## 2020-08-31 DIAGNOSIS — Z5112 Encounter for antineoplastic immunotherapy: Secondary | ICD-10-CM | POA: Diagnosis not present

## 2020-08-31 DIAGNOSIS — Z95828 Presence of other vascular implants and grafts: Secondary | ICD-10-CM

## 2020-08-31 DIAGNOSIS — Z7189 Other specified counseling: Secondary | ICD-10-CM

## 2020-08-31 LAB — CBC WITH DIFFERENTIAL/PLATELET
Abs Immature Granulocytes: 0.05 10*3/uL (ref 0.00–0.07)
Basophils Absolute: 0.1 10*3/uL (ref 0.0–0.1)
Basophils Relative: 1 %
Eosinophils Absolute: 0 10*3/uL (ref 0.0–0.5)
Eosinophils Relative: 0 %
HCT: 35.6 % — ABNORMAL LOW (ref 39.0–52.0)
Hemoglobin: 11.7 g/dL — ABNORMAL LOW (ref 13.0–17.0)
Immature Granulocytes: 1 %
Lymphocytes Relative: 13 %
Lymphs Abs: 0.9 10*3/uL (ref 0.7–4.0)
MCH: 28.7 pg (ref 26.0–34.0)
MCHC: 32.9 g/dL (ref 30.0–36.0)
MCV: 87.5 fL (ref 80.0–100.0)
Monocytes Absolute: 0.8 10*3/uL (ref 0.1–1.0)
Monocytes Relative: 12 %
Neutro Abs: 4.9 10*3/uL (ref 1.7–7.7)
Neutrophils Relative %: 73 %
Platelets: 271 10*3/uL (ref 150–400)
RBC: 4.07 MIL/uL — ABNORMAL LOW (ref 4.22–5.81)
RDW: 19.6 % — ABNORMAL HIGH (ref 11.5–15.5)
WBC: 6.7 10*3/uL (ref 4.0–10.5)
nRBC: 0 % (ref 0.0–0.2)

## 2020-08-31 LAB — CMP (CANCER CENTER ONLY)
ALT: 17 U/L (ref 0–44)
AST: 20 U/L (ref 15–41)
Albumin: 3.8 g/dL (ref 3.5–5.0)
Alkaline Phosphatase: 92 U/L (ref 38–126)
Anion gap: 8 (ref 5–15)
BUN: 15 mg/dL (ref 8–23)
CO2: 27 mmol/L (ref 22–32)
Calcium: 9.3 mg/dL (ref 8.9–10.3)
Chloride: 108 mmol/L (ref 98–111)
Creatinine: 0.82 mg/dL (ref 0.61–1.24)
GFR, Estimated: >60 mL/min (ref >60)
Glucose, Bld: 97 mg/dL (ref 70–99)
Potassium: 4.2 mmol/L (ref 3.5–5.1)
Sodium: 143 mmol/L (ref 135–145)
Total Bilirubin: 0.4 mg/dL (ref 0.3–1.2)
Total Protein: 6 g/dL — ABNORMAL LOW (ref 6.5–8.1)

## 2020-08-31 LAB — LACTATE DEHYDROGENASE: LDH: 187 U/L (ref 98–192)

## 2020-08-31 MED ORDER — DIPHENHYDRAMINE HCL 25 MG PO CAPS
ORAL_CAPSULE | ORAL | Status: AC
  Filled 2020-08-31: qty 2

## 2020-08-31 MED ORDER — SODIUM CHLORIDE 0.9% FLUSH
10.0000 mL | INTRAVENOUS | Status: DC | PRN
  Administered 2020-08-31: 10 mL
  Filled 2020-08-31: qty 10

## 2020-08-31 MED ORDER — FAMOTIDINE 20 MG PO TABS
ORAL_TABLET | ORAL | Status: AC
  Filled 2020-08-31: qty 1

## 2020-08-31 MED ORDER — VINCRISTINE SULFATE CHEMO INJECTION 1 MG/ML
2.0000 mg | Freq: Once | INTRAVENOUS | Status: AC
  Administered 2020-08-31: 2 mg via INTRAVENOUS
  Filled 2020-08-31: qty 2

## 2020-08-31 MED ORDER — SODIUM CHLORIDE 0.9 % IV SOLN
10.0000 mg | Freq: Once | INTRAVENOUS | Status: AC
  Administered 2020-08-31: 10 mg via INTRAVENOUS
  Filled 2020-08-31: qty 10

## 2020-08-31 MED ORDER — FAMOTIDINE IN NACL 20-0.9 MG/50ML-% IV SOLN
20.0000 mg | Freq: Once | INTRAVENOUS | Status: AC
  Administered 2020-08-31: 20 mg via INTRAVENOUS

## 2020-08-31 MED ORDER — SODIUM CHLORIDE 0.9 % IV SOLN
750.0000 mg/m2 | Freq: Once | INTRAVENOUS | Status: AC
  Administered 2020-08-31: 1580 mg via INTRAVENOUS
  Filled 2020-08-31: qty 79

## 2020-08-31 MED ORDER — DIPHENHYDRAMINE HCL 25 MG PO CAPS
50.0000 mg | ORAL_CAPSULE | Freq: Once | ORAL | Status: AC
  Administered 2020-08-31: 50 mg via ORAL

## 2020-08-31 MED ORDER — FAMOTIDINE IN NACL 20-0.9 MG/50ML-% IV SOLN
INTRAVENOUS | Status: AC
  Filled 2020-08-31: qty 50

## 2020-08-31 MED ORDER — PALONOSETRON HCL INJECTION 0.25 MG/5ML
INTRAVENOUS | Status: AC
  Filled 2020-08-31: qty 5

## 2020-08-31 MED ORDER — SODIUM CHLORIDE 0.9 % IV SOLN
375.0000 mg/m2 | Freq: Once | INTRAVENOUS | Status: AC
  Administered 2020-08-31: 800 mg via INTRAVENOUS
  Filled 2020-08-31: qty 50

## 2020-08-31 MED ORDER — ACETAMINOPHEN 325 MG PO TABS
650.0000 mg | ORAL_TABLET | Freq: Once | ORAL | Status: AC
  Administered 2020-08-31: 650 mg via ORAL

## 2020-08-31 MED ORDER — HEPARIN SOD (PORK) LOCK FLUSH 100 UNIT/ML IV SOLN
500.0000 [IU] | Freq: Once | INTRAVENOUS | Status: AC | PRN
  Administered 2020-08-31: 500 [IU]
  Filled 2020-08-31: qty 5

## 2020-08-31 MED ORDER — ACETAMINOPHEN 325 MG PO TABS
ORAL_TABLET | ORAL | Status: AC
  Filled 2020-08-31: qty 2

## 2020-08-31 MED ORDER — SODIUM CHLORIDE 0.9 % IV SOLN
150.0000 mg | Freq: Once | INTRAVENOUS | Status: AC
  Administered 2020-08-31: 150 mg via INTRAVENOUS
  Filled 2020-08-31: qty 150

## 2020-08-31 MED ORDER — DOXORUBICIN HCL CHEMO IV INJECTION 2 MG/ML
50.0000 mg/m2 | Freq: Once | INTRAVENOUS | Status: AC
  Administered 2020-08-31: 106 mg via INTRAVENOUS
  Filled 2020-08-31: qty 53

## 2020-08-31 MED ORDER — SODIUM CHLORIDE 0.9 % IV SOLN
Freq: Once | INTRAVENOUS | Status: AC
  Filled 2020-08-31: qty 250

## 2020-08-31 MED ORDER — PALONOSETRON HCL INJECTION 0.25 MG/5ML
0.2500 mg | Freq: Once | INTRAVENOUS | Status: AC
  Administered 2020-08-31: 0.25 mg via INTRAVENOUS

## 2020-08-31 NOTE — Progress Notes (Signed)
HEMATOLOGY/ONCOLOGY CLINIC NOTE  Date of Service: 08/31/2020  Patient Care Team: [name], MD as PCP - General (Internal Medicine) Debara Pickett Nadean Corwin, MD as PCP - Cardiology (Cardiology) [name], MD (Internal Medicine)  CHIEF COMPLAINTS/PURPOSE OF CONSULTATION:  Mx of high grade follicular lymphoma  HISTORY OF PRESENTING ILLNESS:   George Proctor is a wonderful 64 y.o. male who has been referred to [place] by Dr. Shelia Media for evaluation and management of possible wide-spread lymphoma - palpable mass in abdomen. Pt is accompanied today by his wife. The pt reports that he is doing well overall.   The pt reports that about three weeks ago he began experiencing abdominal fullness. Two weeks ago he began to feel abdominal pain. Lying down makes this discomfort worse. He has not had a restful sleep in two weeks. The abdominal fullness has caused lack of appetite, belching, and one episode of nausea and vomiting. He is currently eating 1/3-1/2 of his baseline. Pt has been taking Tramadol to control his pain, as well as a stool softener. His bowel movements look different, but he denies any constipation. He is hot natured and has mild night sweats, but denies drenching night sweats. Pt feels that he has been short of breath for awhile, but that this may have worsened lately. Pt follows with Dr. Benson Norway, GI.   He had abdominal surgery as a child after an accident. Pt has had GI bleeding and ulcers from taking NSAIDS. Pt has had several instances of abdominal swelling that were triggered by illness. Pt had esophageal dialation. His strictures were thought to be caused by acid reflux. He also has an umbilical hernia. He has chronic back pain. Pt was diagnosed with chronic lyme disease by a [place]. He has a history of balance issues and has brought this up to his PCP, who did not feel that it required a work up. He does not have a true allergy, but experiences nausea and vomiting when  taking Omeprazole. Pt denies any international travel in the last year.   Pt has been taking weekly Testosterone for 3-4 months. He was experiencing fatigue prior to beginning the hormonal injections. Pt was placed on Latuda & Lamictil several weeks ago for his Depression and Anxiety, but discontinued a week ago as he did not see any improvement in mood. He is not currently following with a mental health professional.   Dr. Shelia Media referred pt to Dr. Redmond Pulling at [facility] for biopsy consideration. Pt is scheduled to see Dr. Shelia Media next Wednesday.   Of note prior to the patient's visit today, pt has had  CT Abd/Pel completed on 06/05/2020 with results revealing numerous cardiophrenic, retroperitoneal and mesenteric adenopathy most consistent with lymphoma. Small volume of ascites. Hepatosplenomegaly.  Most recent lab results (03/22/2020) of CBC is as follows: all values are WNL except for WBC at 3.5K, PLT at 131K, Lymphs Rel at 17.6, Total Protein at 6.3.  On review of systems, pt reports worsening SOB, abdominal pain, abdominal fullness, low appetite, dyspepsia, mild night sweats and denies fevers, chills, rash, headaches, leg swelling, urinary habit changes, constipation, testicular pain/swelling and any other symptoms.   On PMHx the pt reports Depression, Anxiety, Abdominal Surgery, Gait difficulty, Esophageal dilation. On Family Hx the pt reports that his father and paternal aunt had polymyalgia rheumatica. His paternal aunt also had breast cancer.  INTERVAL HISTORY: George Proctor is a wonderful 64 y.o. male who is here for evaluation and management of lymphoma.  He is here for C4 of R-CHOP. The patient's last visit with [place] was on 08/08/2020. The pt reports that he is doing well overall.  The pt reports that he has has more fatigue after the last cycle of treatment. He has continued to eat well and has even gained weight. Pt has difficulty completing his daily tasks some times but  feels that his depression may play a role in this as well. Pt is being seen by a counselor online and was recently diagnosed with Bipolar Type II. Pt admits some suicidal ideations but has no plans. This has been the case for many decades.   Lab results today (08/31/20) of CBC w/diff and CMP is as follows: all values are WNL except for RBC at 4.07, Hgb at 11.7, HCT at 35.6, RDW at 19.6, Total Protein at 6.0. 08/31/2020 LDH at 187  On review of systems, pt reports fatigue, depressive moods and denies unexpected weight loss, low appetite, nausea and any other symptoms.   MEDICAL HISTORY:  Past Medical History:  Diagnosis Date  . Anxiety   . Depression   . Gait difficulty   . Lymphoma ([place])   . Memory loss    SURGICAL HISTORY: Past Surgical History:  Procedure Laterality Date  . ABDOMINAL SURGERY     Childhood  . BUNIONECTOMY    . IR IMAGING GUIDED PORT INSERTION  06/30/2020  . IR PARACENTESIS  06/26/2020  . IR US GUIDE BX ASP/DRAIN  06/26/2020    SOCIAL HISTORY: Social History   Socioeconomic History  . Marital status: Married    Spouse name: Not on file  . Number of children: 0  . Years of education: Veterinary surgeon  . Highest education level: Not on file  Occupational History  . Occupation: Scientist, research (physical sciences)  Tobacco Use  . Smoking status: Never Smoker  . Smokeless tobacco: Never Used  Vaping Use  . Vaping Use: Never used  Substance and Sexual Activity  . Alcohol use: No    Alcohol/week: 0.0 standard drinks    Comment: Less than one drink per week.  . Drug use: No  . Sexual activity: Not Currently  Other Topics Concern  . Not on file  Social History Narrative   Lives at home with wife.   Right-handed.   3 cups caffeine per day.   Social Determinants of Health   Financial Resource Strain: Not on file  Food Insecurity: Not on file  Transportation Needs: Not on file  Physical Activity: Not on file  Stress: Not on file  Social Connections: Not  on file  Intimate Partner Violence: Not on file    FAMILY HISTORY: Family History  Problem Relation Age of Onset  . Aneurysm Mother 55       Thoracic  . Hypertension Father   . Heart failure Father   . Heart disease Father   . Heart attack Paternal Grandfather   . Depression Cousin   . Bipolar disorder Cousin   . Schizophrenia Maternal Uncle     ALLERGIES:  is allergic to omeprazole.  MEDICATIONS:  Current Outpatient Medications  Medication Sig Dispense Refill  . allopurinol (ZYLOPRIM) 100 MG tablet Take 1 tablet (100 mg total) by mouth 2 (two) times daily. 60 tablet 2  . Ascorbic Acid (VITAMIN C) 1000 MG tablet Take 1,000 mg by mouth daily.    . B Complex Vitamins (VITAMIN B COMPLEX PO) Take 1 tablet by mouth daily.     Marland Kitchen lidocaine-prilocaine (EMLA) cream Apply  to affected area once 30 g 3  . LORazepam (ATIVAN) 0.5 MG tablet Take 1 tablet (0.5 mg total) by mouth every 6 (six) hours as needed (Nausea or vomiting). 30 tablet 0  . Multiple Vitamin (MULTIVITAMIN) capsule Take 1 capsule by mouth daily.    . ondansetron (ZOFRAN) 8 MG tablet Take 1 tablet (8 mg total) by mouth every 8 (eight) hours as needed for vomiting or refractory nausea / vomiting. Start on day 3 after cyclophosphamide chemotherapy. 30 tablet 1  . oxyCODONE (OXY IR/ROXICODONE) 5 MG immediate release tablet Take 1 tablet (5 mg total) by mouth every 4 (four) hours as needed for severe pain. 30 tablet 0  . pantoprazole (PROTONIX) 40 MG tablet Take 40 mg by mouth 2 (two) times daily as needed (indigestion).     . polyethylene glycol (MIRALAX) 17 g packet Take 17 g by mouth daily. 30 each 1  . predniSONE (DELTASONE) 20 MG tablet Prednisone 60 mg po daily for 5 days with each cycle of R-CHOP. Take with food. 15 tablet 3  . prochlorperazine (COMPAZINE) 10 MG tablet Take 1 tablet (10 mg total) by mouth every 6 (six) hours as needed (Nausea or vomiting). 30 tablet 6  . senna-docusate (SENNA S) 8.6-50 MG tablet Take 2 tablets  by mouth at bedtime. 60 tablet 1  . testosterone cypionate (DEPOTESTOSTERONE CYPIONATE) 200 MG/ML injection Inject into the muscle once a week.     . zolpidem (AMBIEN CR) 12.5 MG CR tablet Take 1 tablet (12.5 mg total) by mouth at bedtime as needed for sleep. 30 tablet 0   No current facility-administered medications for this visit.   Facility-Administered Medications Ordered in Other Visits  Medication Dose Route Frequency Provider Last Rate Last Admin  . cyclophosphamide (CYTOXAN) 1,580 mg in sodium chloride 0.9 % 250 mL chemo infusion  750 mg/m2 (Treatment Plan Recorded) Intravenous Once Brunetta Genera, MD      . DOXOrubicin (ADRIAMYCIN) chemo injection 106 mg  50 mg/m2 (Treatment Plan Recorded) Intravenous Once Brunetta Genera, MD      . heparin lock flush 100 unit/mL  500 Units Intracatheter Once PRN Brunetta Genera, MD      . riTUXimab-pvvr (RUXIENCE) 800 mg in sodium chloride 0.9 % 250 mL (2.4242 mg/mL) infusion  375 mg/m2 (Treatment Plan Recorded) Intravenous Once Brunetta Genera, MD      . sodium chloride flush (NS) 0.9 % injection 10 mL  10 mL Intracatheter PRN Brunetta Genera, MD      . vinCRIStine (ONCOVIN) 2 mg in sodium chloride 0.9 % 50 mL chemo infusion  2 mg Intravenous Once Brunetta Genera, MD        REVIEW OF SYSTEMS:   A 10+ POINT REVIEW OF SYSTEMS WAS OBTAINED including neurology, dermatology, psychiatry, cardiac, respiratory, lymph, extremities, GI, GU, Musculoskeletal, constitutional, breasts, reproductive, HEENT.  All pertinent positives are noted in the HPI.  All others are negative.    PHYSICAL EXAMINATION: ECOG PERFORMANCE STATUS: 2 - Symptomatic, <50% confined to bed  . There were no vitals filed for this visit. There were no vitals filed for this visit. .There is no height or weight on file to calculate BMI.  Exam was given in a chair   GENERAL:alert, in no acute distress and comfortable SKIN: no acute rashes, no significant  lesions EYES: conjunctiva are pink and non-injected, sclera anicteric OROPHARYNX: MMM, no exudates, no oropharyngeal erythema or ulceration NECK: supple, no JVD LYMPH:  no palpable lymphadenopathy in the  cervical, axillary or inguinal regions LUNGS: clear to auscultation b/l with normal respiratory effort HEART: regular rate & rhythm ABDOMEN:  normoactive bowel sounds , non tender, not distended. No palpable hepatosplenomegaly.  Extremity: no pedal edema PSYCH: alert & oriented x 3 with fluent speech NEURO: no focal motor/sensory deficits  LABORATORY DATA:  I have reviewed the data as listed  . CBC Latest Ref Rng & Units 08/31/2020 08/16/2020 08/08/2020  WBC 4.0 - 10.5 K/uL 6.7 41.8(H) 7.8  Hemoglobin 13.0 - 17.0 g/dL 11.7(L) 11.0(L) 11.3(L)  Hematocrit 39.0 - 52.0 % 35.6(L) 33.7(L) 34.8(L)  Platelets 150 - 400 K/uL 271 237 251    . CMP Latest Ref Rng & Units 08/31/2020 08/16/2020 08/08/2020  Glucose 70 - 99 mg/dL 97 136(H) 112(H)  BUN 8 - 23 mg/dL 15 20 15   Creatinine 0.61 - 1.24 mg/dL 0.82 0.75 0.79  Sodium 135 - 145 mmol/L 143 139 144  Potassium 3.5 - 5.1 mmol/L 4.2 4.2 3.8  Chloride 98 - 111 mmol/L 108 106 110  CO2 22 - 32 mmol/L 27 23 24   Calcium 8.9 - 10.3 mg/dL 9.3 9.3 9.0  Total Protein 6.5 - 8.1 g/dL 6.0(L) 5.8(L) 5.9(L)  Total Bilirubin 0.3 - 1.2 mg/dL 0.4 0.4 0.3  Alkaline Phos 38 - 126 U/L 92 114 108  AST 15 - 41 U/L 20 12(L) 16  ALT 0 - 44 U/L 17 13 16    . Lab Results  Component Value Date   LDH 187 08/31/2020   Ric acid 3.9  RADIOGRAPHIC STUDIES: I have personally reviewed the radiological images as listed and agreed with the findings in the report. No results found.     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  PLAN: -Discussed pt labwork today, 08/31/20; Hgb looks great, WBC & PLT are nml, blood  chemistries look good, LDH is WNL. -The pt has no prohibitive toxicities from continuing C4 R-CHOP at this time. -Recommended that the pt continue to eat well and get 20-30 minutes of activity each day. -Advised pt that fatigue can be cumulative with treatment. -Advised pt to alert [place] to any intrusive, destructive thoughts.  -Offered pt referral to local counselor - pt declines at this time.  -Will get CT C/A/P in 2 weeks  -Will see back in 3 weeks with labs   FOLLOW UP: CT chest/abd/pelvis in 2 weeks Plz schedule next cycle of R-CHOP with portflush, labs and MD visit in 3 weeks as ordered   The total time spent in the appt was 30 minutes and more than 50% was on counseling and direct patient cares, ordering and management of chemotherapy  All of the patient's questions were answered with apparent satisfaction. The patient knows to call the clinic with any problems, questions or concerns.    Sullivan Lone MD [place] AAHIVMS [facility] Hematology/Oncology Physician [facility]  (Office):       [phone] (Work cell):  [phone] (Fax):           [phone]  08/31/2020 1:20 PM  I, Yevette Edwards, am acting as a scribe for Dr. Sullivan Lone.   .I have reviewed the above documentation for accuracy and completeness, and I agree with the above. Brunetta Genera MD

## 2020-08-31 NOTE — Patient Instructions (Signed)
[facility] Discharge Instructions for Patients Receiving Chemotherapy  Today you received the following chemotherapy agents: Adriamycin, vincristine, Cytoxan, Rituxan  To help prevent nausea and vomiting after your treatment, we encourage you to take your nausea medication as directed.   If you develop nausea and vomiting that is not controlled by your nausea medication, call the clinic.   BELOW ARE SYMPTOMS THAT SHOULD BE REPORTED IMMEDIATELY:  *FEVER GREATER THAN 100.5 F  *CHILLS WITH OR WITHOUT FEVER  NAUSEA AND VOMITING THAT IS NOT CONTROLLED WITH YOUR NAUSEA MEDICATION  *UNUSUAL SHORTNESS OF BREATH  *UNUSUAL BRUISING OR BLEEDING  TENDERNESS IN MOUTH AND THROAT WITH OR WITHOUT PRESENCE OF ULCERS  *URINARY PROBLEMS  *BOWEL PROBLEMS  UNUSUAL RASH Items with * indicate a potential emergency and should be followed up as soon as possible.  Feel free to call the clinic should you have any questions or concerns. The clinic phone number is [phone].  Please show the [place] at check-in to the Emergency Department and triage nurse.

## 2020-09-02 ENCOUNTER — Other Ambulatory Visit: Payer: Self-pay

## 2020-09-02 ENCOUNTER — Inpatient Hospital Stay: Payer: 59

## 2020-09-02 VITALS — BP 141/76 | HR 101 | Temp 97.5°F | Resp 15 | Ht 73.0 in

## 2020-09-02 DIAGNOSIS — C8248 Follicular lymphoma grade IIIb, lymph nodes of multiple sites: Secondary | ICD-10-CM

## 2020-09-02 DIAGNOSIS — Z7189 Other specified counseling: Secondary | ICD-10-CM

## 2020-09-02 DIAGNOSIS — Z5112 Encounter for antineoplastic immunotherapy: Secondary | ICD-10-CM | POA: Diagnosis not present

## 2020-09-02 MED ORDER — PEGFILGRASTIM-CBQV 6 MG/0.6ML ~~LOC~~ SOSY
6.0000 mg | PREFILLED_SYRINGE | Freq: Once | SUBCUTANEOUS | Status: AC
  Administered 2020-09-02: 6 mg via SUBCUTANEOUS

## 2020-09-02 NOTE — Patient Instructions (Signed)

## 2020-09-11 DIAGNOSIS — R69 Illness, unspecified: Secondary | ICD-10-CM | POA: Diagnosis not present

## 2020-09-11 DIAGNOSIS — C8590 Non-Hodgkin lymphoma, unspecified, unspecified site: Secondary | ICD-10-CM | POA: Diagnosis not present

## 2020-09-11 DIAGNOSIS — Z79899 Other long term (current) drug therapy: Secondary | ICD-10-CM | POA: Diagnosis not present

## 2020-09-11 DIAGNOSIS — F900 Attention-deficit hyperactivity disorder, predominantly inattentive type: Secondary | ICD-10-CM | POA: Diagnosis not present

## 2020-09-11 DIAGNOSIS — F411 Generalized anxiety disorder: Secondary | ICD-10-CM | POA: Diagnosis not present

## 2020-09-13 ENCOUNTER — Encounter (HOSPITAL_COMMUNITY): Payer: Self-pay

## 2020-09-13 ENCOUNTER — Ambulatory Visit (HOSPITAL_COMMUNITY)
Admission: RE | Admit: 2020-09-13 | Discharge: 2020-09-13 | Disposition: A | Discharge status: Home / Self Care | Payer: 59 | Source: Ambulatory Visit | Attending: Hematology | Admitting: Hematology

## 2020-09-13 ENCOUNTER — Other Ambulatory Visit: Payer: Self-pay

## 2020-09-13 DIAGNOSIS — J90 Pleural effusion, not elsewhere classified: Secondary | ICD-10-CM | POA: Diagnosis not present

## 2020-09-13 DIAGNOSIS — I714 Abdominal aortic aneurysm, without rupture: Secondary | ICD-10-CM | POA: Diagnosis not present

## 2020-09-13 DIAGNOSIS — C8248 Follicular lymphoma grade IIIb, lymph nodes of multiple sites: Secondary | ICD-10-CM | POA: Diagnosis not present

## 2020-09-13 MED ORDER — IOHEXOL 300 MG/ML  SOLN
100.0000 mL | Freq: Once | INTRAMUSCULAR | Status: AC | PRN
  Administered 2020-09-13: 100 mL via INTRAVENOUS

## 2020-09-14 ENCOUNTER — Telehealth: Payer: Self-pay | Admitting: Hematology

## 2020-09-14 NOTE — Telephone Encounter (Signed)
Called patient regarding upcoming appointments, patient is notified. 

## 2020-09-19 ENCOUNTER — Telehealth: Payer: Self-pay | Admitting: *Deleted

## 2020-09-19 DIAGNOSIS — C8248 Follicular lymphoma grade IIIb, lymph nodes of multiple sites: Secondary | ICD-10-CM

## 2020-09-19 DIAGNOSIS — Z7189 Other specified counseling: Secondary | ICD-10-CM

## 2020-09-19 MED ORDER — ONDANSETRON HCL 8 MG PO TABS: 8.0000 mg | ORAL_TABLET | Freq: Three times a day (TID) | ORAL | 1 refills | Status: DC | PRN

## 2020-09-19 NOTE — Telephone Encounter (Signed)
Patient called - Takes Concerta for ADHD - wants to know if ok for him to take? Dr. Candise Che informed of question. Per Dr. Candise Che - no contraindication from oncology standpoint. Patient informed and verbalized understanding.

## 2020-09-19 NOTE — Telephone Encounter (Signed)
Patient called - requested refill of ondansetron. Refill sent to pharmacy.

## 2020-09-22 ENCOUNTER — Other Ambulatory Visit: Payer: Self-pay

## 2020-09-22 ENCOUNTER — Inpatient Hospital Stay: Payer: 59 | Attending: Hematology

## 2020-09-22 ENCOUNTER — Inpatient Hospital Stay: Payer: 59

## 2020-09-22 ENCOUNTER — Inpatient Hospital Stay: Payer: 59 | Admitting: Hematology

## 2020-09-22 VITALS — BP 112/75 | HR 88 | Temp 97.7°F | Resp 14 | Ht 73.0 in | Wt 172.4 lb

## 2020-09-22 VITALS — BP 109/69 | HR 90 | Temp 98.6°F | Resp 18

## 2020-09-22 DIAGNOSIS — Z5112 Encounter for antineoplastic immunotherapy: Secondary | ICD-10-CM | POA: Diagnosis not present

## 2020-09-22 DIAGNOSIS — Z5111 Encounter for antineoplastic chemotherapy: Secondary | ICD-10-CM

## 2020-09-22 DIAGNOSIS — C8248 Follicular lymphoma grade IIIb, lymph nodes of multiple sites: Secondary | ICD-10-CM

## 2020-09-22 DIAGNOSIS — R350 Frequency of micturition: Secondary | ICD-10-CM | POA: Diagnosis not present

## 2020-09-22 DIAGNOSIS — Z5189 Encounter for other specified aftercare: Secondary | ICD-10-CM | POA: Diagnosis not present

## 2020-09-22 DIAGNOSIS — Z7189 Other specified counseling: Secondary | ICD-10-CM

## 2020-09-22 DIAGNOSIS — Z95828 Presence of other vascular implants and grafts: Secondary | ICD-10-CM

## 2020-09-22 LAB — CBC WITH DIFFERENTIAL/PLATELET
Abs Immature Granulocytes: 0.02 10*3/uL (ref 0.00–0.07)
Basophils Absolute: 0.1 10*3/uL (ref 0.0–0.1)
Basophils Relative: 1 %
Eosinophils Absolute: 0 10*3/uL (ref 0.0–0.5)
Eosinophils Relative: 0 %
HCT: 35.1 % — ABNORMAL LOW (ref 39.0–52.0)
Hemoglobin: 11.3 g/dL — ABNORMAL LOW (ref 13.0–17.0)
Immature Granulocytes: 0 %
Lymphocytes Relative: 10 %
Lymphs Abs: 0.5 10*3/uL — ABNORMAL LOW (ref 0.7–4.0)
MCH: 28.8 pg (ref 26.0–34.0)
MCHC: 32.2 g/dL (ref 30.0–36.0)
MCV: 89.5 fL (ref 80.0–100.0)
Monocytes Absolute: 0.8 10*3/uL (ref 0.1–1.0)
Monocytes Relative: 14 %
Neutro Abs: 3.9 10*3/uL (ref 1.7–7.7)
Neutrophils Relative %: 75 %
Platelets: 255 10*3/uL (ref 150–400)
RBC: 3.92 MIL/uL — ABNORMAL LOW (ref 4.22–5.81)
RDW: 20 % — ABNORMAL HIGH (ref 11.5–15.5)
WBC: 5.2 10*3/uL (ref 4.0–10.5)
nRBC: 0 % (ref 0.0–0.2)

## 2020-09-22 LAB — CMP (CANCER CENTER ONLY)
ALT: 24 U/L (ref 0–44)
AST: 22 U/L (ref 15–41)
Albumin: 3.9 g/dL (ref 3.5–5.0)
Alkaline Phosphatase: 77 U/L (ref 38–126)
Anion gap: 7 (ref 5–15)
BUN: 12 mg/dL (ref 8–23)
CO2: 27 mmol/L (ref 22–32)
Calcium: 9.1 mg/dL (ref 8.9–10.3)
Chloride: 107 mmol/L (ref 98–111)
Creatinine: 0.77 mg/dL (ref 0.61–1.24)
GFR, Estimated: >60 mL/min (ref >60)
Glucose, Bld: 99 mg/dL (ref 70–99)
Potassium: 4 mmol/L (ref 3.5–5.1)
Sodium: 141 mmol/L (ref 135–145)
Total Bilirubin: 0.5 mg/dL (ref 0.3–1.2)
Total Protein: 6 g/dL — ABNORMAL LOW (ref 6.5–8.1)

## 2020-09-22 LAB — LACTATE DEHYDROGENASE: LDH: 184 U/L (ref 98–192)

## 2020-09-22 LAB — MAGNESIUM: Magnesium: 2.1 mg/dL (ref 1.7–2.4)

## 2020-09-22 MED ORDER — SODIUM CHLORIDE 0.9% FLUSH
10.0000 mL | INTRAVENOUS | Status: DC | PRN
  Administered 2020-09-22: 10 mL
  Filled 2020-09-22: qty 10

## 2020-09-22 MED ORDER — PALONOSETRON HCL INJECTION 0.25 MG/5ML
0.2500 mg | Freq: Once | INTRAVENOUS | Status: AC
  Administered 2020-09-22: 0.25 mg via INTRAVENOUS

## 2020-09-22 MED ORDER — HEPARIN SOD (PORK) LOCK FLUSH 100 UNIT/ML IV SOLN
500.0000 [IU] | Freq: Once | INTRAVENOUS | Status: AC | PRN
  Administered 2020-09-22: 500 [IU]
  Filled 2020-09-22: qty 5

## 2020-09-22 MED ORDER — DIPHENHYDRAMINE HCL 25 MG PO CAPS
50.0000 mg | ORAL_CAPSULE | Freq: Once | ORAL | Status: AC
  Administered 2020-09-22: 50 mg via ORAL

## 2020-09-22 MED ORDER — SODIUM CHLORIDE 0.9 % IV SOLN
Freq: Once | INTRAVENOUS | Status: AC
  Filled 2020-09-22: qty 250

## 2020-09-22 MED ORDER — FAMOTIDINE IN NACL 20-0.9 MG/50ML-% IV SOLN
INTRAVENOUS | Status: AC
  Filled 2020-09-22: qty 50

## 2020-09-22 MED ORDER — PALONOSETRON HCL INJECTION 0.25 MG/5ML
INTRAVENOUS | Status: AC
  Filled 2020-09-22: qty 5

## 2020-09-22 MED ORDER — DIPHENHYDRAMINE HCL 25 MG PO CAPS
ORAL_CAPSULE | ORAL | Status: AC
  Filled 2020-09-22: qty 2

## 2020-09-22 MED ORDER — DOXORUBICIN HCL CHEMO IV INJECTION 2 MG/ML
50.0000 mg/m2 | Freq: Once | INTRAVENOUS | Status: AC
  Administered 2020-09-22: 106 mg via INTRAVENOUS
  Filled 2020-09-22: qty 53

## 2020-09-22 MED ORDER — SODIUM CHLORIDE 0.9 % IV SOLN
750.0000 mg/m2 | Freq: Once | INTRAVENOUS | Status: AC
  Administered 2020-09-22: 1580 mg via INTRAVENOUS
  Filled 2020-09-22: qty 79

## 2020-09-22 MED ORDER — ACETAMINOPHEN 325 MG PO TABS
ORAL_TABLET | ORAL | Status: AC
  Filled 2020-09-22: qty 2

## 2020-09-22 MED ORDER — SODIUM CHLORIDE 0.9 % IV SOLN
150.0000 mg | Freq: Once | INTRAVENOUS | Status: AC
  Administered 2020-09-22: 150 mg via INTRAVENOUS
  Filled 2020-09-22: qty 150

## 2020-09-22 MED ORDER — SODIUM CHLORIDE 0.9 % IV SOLN
10.0000 mg | Freq: Once | INTRAVENOUS | Status: AC
  Administered 2020-09-22: 10 mg via INTRAVENOUS
  Filled 2020-09-22: qty 10

## 2020-09-22 MED ORDER — FAMOTIDINE IN NACL 20-0.9 MG/50ML-% IV SOLN
20.0000 mg | Freq: Once | INTRAVENOUS | Status: AC
  Administered 2020-09-22: 20 mg via INTRAVENOUS

## 2020-09-22 MED ORDER — VINCRISTINE SULFATE CHEMO INJECTION 1 MG/ML
2.0000 mg | Freq: Once | INTRAVENOUS | Status: AC
Start: 2020-09-22 — End: 2020-09-22
  Administered 2020-09-22: 2 mg via INTRAVENOUS
  Filled 2020-09-22: qty 2

## 2020-09-22 MED ORDER — SODIUM CHLORIDE 0.9 % IV SOLN
375.0000 mg/m2 | Freq: Once | INTRAVENOUS | Status: AC
  Administered 2020-09-22: 800 mg via INTRAVENOUS
  Filled 2020-09-22: qty 50

## 2020-09-22 MED ORDER — ACETAMINOPHEN 325 MG PO TABS
650.0000 mg | ORAL_TABLET | Freq: Once | ORAL | Status: AC
  Administered 2020-09-22: 650 mg via ORAL

## 2020-09-22 NOTE — Patient Instructions (Signed)

## 2020-09-22 NOTE — Patient Instructions (Signed)
[facility] Discharge Instructions for Patients Receiving Chemotherapy  Today you received the following chemotherapy agents: doxorubicin/vincristine/cyclophosphamide/rituximab.  To help prevent nausea and vomiting after your treatment, we encourage you to take your nausea medication as directed.   If you develop nausea and vomiting that is not controlled by your nausea medication, call the clinic.   BELOW ARE SYMPTOMS THAT SHOULD BE REPORTED IMMEDIATELY:  *FEVER GREATER THAN 100.5 F  *CHILLS WITH OR WITHOUT FEVER  NAUSEA AND VOMITING THAT IS NOT CONTROLLED WITH YOUR NAUSEA MEDICATION  *UNUSUAL SHORTNESS OF BREATH  *UNUSUAL BRUISING OR BLEEDING  TENDERNESS IN MOUTH AND THROAT WITH OR WITHOUT PRESENCE OF ULCERS  *URINARY PROBLEMS  *BOWEL PROBLEMS  UNUSUAL RASH Items with * indicate a potential emergency and should be followed up as soon as possible.  Feel free to call the clinic should you have any questions or concerns. The clinic phone number is [phone].  Please show the [place] at check-in to the Emergency Department and triage nurse.

## 2020-09-22 NOTE — Progress Notes (Signed)
HEMATOLOGY/ONCOLOGY CLINIC NOTE  Date of Service: 09/22/2020  Patient Care Team: [name], MD as PCP - General (Internal Medicine) Debara Pickett Nadean Corwin, MD as PCP - Cardiology (Cardiology) [name], MD (Internal Medicine)  CHIEF COMPLAINTS/PURPOSE OF CONSULTATION:  Mx of high grade follicular lymphoma  HISTORY OF PRESENTING ILLNESS:   George Proctor is a wonderful 65 y.o. male who has been referred to [place] by Dr. Shelia Media for evaluation and management of possible wide-spread lymphoma - palpable mass in abdomen. Pt is accompanied today by his wife. The pt reports that he is doing well overall.   The pt reports that about three weeks ago he began experiencing abdominal fullness. Two weeks ago he began to feel abdominal pain. Lying down makes this discomfort worse. He has not had a restful sleep in two weeks. The abdominal fullness has caused lack of appetite, belching, and one episode of nausea and vomiting. He is currently eating 1/3-1/2 of his baseline. Pt has been taking Tramadol to control his pain, as well as a stool softener. His bowel movements look different, but he denies any constipation. He is hot natured and has mild night sweats, but denies drenching night sweats. Pt feels that he has been short of breath for awhile, but that this may have worsened lately. Pt follows with Dr. Benson Norway, GI.   He had abdominal surgery as a child after an accident. Pt has had GI bleeding and ulcers from taking NSAIDS. Pt has had several instances of abdominal swelling that were triggered by illness. Pt had esophageal dialation. His strictures were thought to be caused by acid reflux. He also has an umbilical hernia. He has chronic back pain. Pt was diagnosed with chronic lyme disease by a [place]. He has a history of balance issues and has brought this up to his PCP, who did not feel that it required a work up. He does not have a true allergy, but experiences nausea and vomiting when taking  Omeprazole. Pt denies any international travel in the last year.   Pt has been taking weekly Testosterone for 3-4 months. He was experiencing fatigue prior to beginning the hormonal injections. Pt was placed on Latuda & Lamictil several weeks ago for his Depression and Anxiety, but discontinued a week ago as he did not see any improvement in mood. He is not currently following with a mental health professional.   Dr. Shelia Media referred pt to Dr. Redmond Pulling at [facility] for biopsy consideration. Pt is scheduled to see Dr. Shelia Media next Wednesday.   Of note prior to the patient's visit today, pt has had  CT Abd/Pel completed on 06/05/2020 with results revealing numerous cardiophrenic, retroperitoneal and mesenteric adenopathy most consistent with lymphoma. Small volume of ascites. Hepatosplenomegaly.  Most recent lab results (03/22/2020) of CBC is as follows: all values are WNL except for WBC at 3.5K, PLT at 131K, Lymphs Rel at 17.6, Total Protein at 6.3.  On review of systems, pt reports worsening SOB, abdominal pain, abdominal fullness, low appetite, dyspepsia, mild night sweats and denies fevers, chills, rash, headaches, leg swelling, urinary habit changes, constipation, testicular pain/swelling and any other symptoms.   On PMHx the pt reports Depression, Anxiety, Abdominal Surgery, Gait difficulty, Esophageal dilation. On Family Hx the pt reports that his father and paternal aunt had polymyalgia rheumatica. His paternal aunt also had breast cancer.  INTERVAL HISTORY: George Proctor is a wonderful .65 y.o. male who is here for evaluation and management of lymphoma. He  is here for C5 of R-CHOP. The patient's last visit with [place] was on 08/31/2020. Pt is accompanied today by his wife.The pt reports that he is doing well overall.  The pt reports that he has been experiencing more fatigue. His eating has improved, but his appetite is still decreased. The pt notes some constipation due to the lack of  taking the prescribed Senna S. He also notes an increased frequency in urination during the night predominantly, but also during the day. The pt denies any pain, change in color, or blood in the urine. He continuously drinks the 2L water evenly throughout the day.  The pt notes that recently he experienced a hard fall against a trailer. Since then, he has noticed lumps and bumps in his arm that cause discomfort. He has not been engaging in as much physical activity as previously.  The pt notes that he has began Concerta and continued Ambien. He notes the Ambien did not aid in sleep.The pt is still in touch with his counselor.   Of note since the patient's last visit, pt has had CT C/A/P ([id number]) completed on 09/13/2020 with results revealing "1. Marked interval response to therapy now with confluent stranding at the site of bulky soft tissue seen in the abdomen and pelvis on the prior study also with resolution of ascites and pleural effusions. 2. Small discrete lymph nodes CT now be visualized in the retroperitoneum. No new signs of nodal disease. Attention on follow-up. 3. Resolution of disease in the chest including pleural effusion mentioned above. 4. Aortic atherosclerosis. 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 09/22/2020 Magnesium at 2.1. 09/22/2020 LDH at 184.  On review of systems, pt reports increased urination, increased imbalance, arm pain and denies bloody/discolored urine, malodorous urine, port problems, back pain, abdominal pain, abdominal  bloating, and any other symptoms.   MEDICAL HISTORY:  Past Medical History:  Diagnosis Date  . Anxiety   . Depression   . Gait difficulty   . Lymphoma ([facility])   . Memory loss    SURGICAL HISTORY: Past Surgical History:  Procedure Laterality Date  . ABDOMINAL SURGERY     Childhood  . BUNIONECTOMY    . IR IMAGING GUIDED PORT INSERTION  06/30/2020  . IR PARACENTESIS  06/26/2020  . IR US GUIDE BX ASP/DRAIN  06/26/2020    SOCIAL HISTORY: Social History   Socioeconomic History  . Marital status: Married    Spouse name: Not on file  . Number of children: 0  . Years of education: Veterinary surgeon  . Highest education level: Not on file  Occupational History  . Occupation: Scientist, research (physical sciences)  Tobacco Use  . Smoking status: Never Smoker  . Smokeless tobacco: Never Used  Vaping Use  . Vaping Use: Never used  Substance and Sexual Activity  . Alcohol use: No    Alcohol/week: 0.0 standard drinks    Comment: Less than one drink per week.  . Drug use: No  . Sexual activity: Not Currently  Other Topics Concern  . Not on file  Social History Narrative   Lives at home with wife.   Right-handed.   3 cups caffeine per day.   Social Determinants of Health   Financial Resource Strain: Not on file  Food Insecurity: Not on file  Transportation Needs: Not on file  Physical Activity: Not on file  Stress: Not on file  Social Connections: Not on file  Intimate Partner Violence: Not on file    FAMILY HISTORY: Family History  Problem Relation Age of Onset  . Aneurysm Mother 23       Thoracic  . Hypertension Father   . Heart failure Father   . Heart disease Father   . Heart attack Paternal Grandfather   . Depression Cousin   . Bipolar disorder Cousin   . Schizophrenia Maternal Uncle     ALLERGIES:  is allergic to omeprazole.  MEDICATIONS:  Current Outpatient Medications  Medication Sig Dispense Refill  . allopurinol (ZYLOPRIM) 100 MG tablet Take 1 tablet (100  mg total) by mouth 2 (two) times daily. 60 tablet 2  . Ascorbic Acid (VITAMIN C) 1000 MG tablet Take 1,000 mg by mouth daily.    . B Complex Vitamins (VITAMIN B COMPLEX PO) Take 1 tablet by mouth daily.     Marland Kitchen lidocaine-prilocaine (EMLA) cream Apply to affected area once 30 g 3  . LORazepam (ATIVAN) 0.5 MG tablet Take 1 tablet (0.5 mg total) by mouth every 6 (six) hours as needed (Nausea or vomiting). 30 tablet 0  . Multiple Vitamin (MULTIVITAMIN) capsule Take 1 capsule by mouth daily.    . ondansetron (ZOFRAN) 8 MG tablet Take 1 tablet (8 mg total) by mouth every 8 (eight) hours as needed for vomiting or refractory nausea / vomiting. Start on day 3 after cyclophosphamide chemotherapy. 30 tablet 1  . oxyCODONE (OXY IR/ROXICODONE) 5 MG immediate release tablet Take 1 tablet (5 mg total) by mouth every 4 (four) hours as needed for severe pain. 30 tablet 0  . pantoprazole (PROTONIX) 40 MG tablet Take 40 mg by mouth 2 (two) times daily as needed (indigestion).     . polyethylene glycol (MIRALAX) 17 g packet Take 17 g by mouth daily. 30 each 1  . predniSONE (DELTASONE) 20 MG tablet Prednisone 60 mg po daily for 5 days with each cycle of R-CHOP. Take with food. 15 tablet 3  . prochlorperazine (COMPAZINE) 10 MG tablet Take 1 tablet (10 mg total) by mouth every 6 (six) hours as needed (Nausea or vomiting). 30 tablet 6  . senna-docusate (SENNA S) 8.6-50 MG tablet Take 2 tablets by mouth at bedtime. 60 tablet 1  . testosterone cypionate (DEPOTESTOSTERONE CYPIONATE) 200 MG/ML injection Inject into the muscle once a week.     . zolpidem (AMBIEN CR) 12.5 MG CR tablet Take 1 tablet (12.5 mg total) by mouth at bedtime as needed for sleep. 30 tablet 0   No current facility-administered medications for this visit.    REVIEW OF SYSTEMS:   A 10+ POINT REVIEW OF SYSTEMS WAS OBTAINED including neurology, dermatology, psychiatry, cardiac, respiratory, lymph, extremities, GI, GU, Musculoskeletal, constitutional,  breasts, reproductive, HEENT.  All pertinent positives are noted in the HPI.  All others are negative.   PHYSICAL EXAMINATION: ECOG PERFORMANCE STATUS: 2 - Symptomatic, <50% confined to bed  . Vitals:   09/22/20 1045  BP: 112/75  Pulse: 88  Resp: 14  Temp: 97.7 F (36.5 C)  SpO2: 98%   Filed Weights  09/22/20 1045  Weight: 172 lb 6.4 oz (78.2 kg)   .Body mass index is 22.75 kg/m.  GENERAL:alert, in no acute distress and comfortable SKIN: no acute rashes, no significant lesions EYES: conjunctiva are pink and non-injected, sclera anicteric OROPHARYNX: MMM, no exudates, no oropharyngeal erythema or ulceration NECK: supple, no JVD LYMPH:  no palpable lymphadenopathy in the cervical, axillary or inguinal regions LUNGS: clear to auscultation b/l with normal respiratory effort HEART: regular rate & rhythm ABDOMEN:  normoactive bowel sounds , non tender, not distended. No palpable hepatosplenomegaly.  Extremity: no pedal edema PSYCH: alert & oriented x 3 with fluent speech NEURO: no focal motor/sensory deficits  LABORATORY DATA:  I have reviewed the data as listed  . CBC Latest Ref Rng & Units 08/31/2020 08/16/2020 08/08/2020  WBC 4.0 - 10.5 K/uL 6.7 41.8(H) 7.8  Hemoglobin 13.0 - 17.0 g/dL 11.7(L) 11.0(L) 11.3(L)  Hematocrit 39.0 - 52.0 % 35.6(L) 33.7(L) 34.8(L)  Platelets 150 - 400 K/uL 271 237 251    . CMP Latest Ref Rng & Units 08/31/2020 08/16/2020 08/08/2020  Glucose 70 - 99 mg/dL 97 136(H) 112(H)  BUN 8 - 23 mg/dL 15 20 15   Creatinine 0.61 - 1.24 mg/dL 0.82 0.75 0.79  Sodium 135 - 145 mmol/L 143 139 144  Potassium 3.5 - 5.1 mmol/L 4.2 4.2 3.8  Chloride 98 - 111 mmol/L 108 106 110  CO2 22 - 32 mmol/L 27 23 24   Calcium 8.9 - 10.3 mg/dL 9.3 9.3 9.0  Total Protein 6.5 - 8.1 g/dL 6.0(L) 5.8(L) 5.9(L)  Total Bilirubin 0.3 - 1.2 mg/dL 0.4 0.4 0.3  Alkaline Phos 38 - 126 U/L 92 114 108  AST 15 - 41 U/L 20 12(L) 16  ALT 0 - 44 U/L 17 13 16    . Lab Results   Component Value Date   LDH 187 08/31/2020    RADIOGRAPHIC STUDIES: I have personally reviewed the radiological images as listed and agreed with the findings in the report. CT CHEST ABDOMEN PELVIS W CONTRAST  Result Date: 09/14/2020 CLINICAL DATA:  Hematologic malignancy, history of chemo and immunotherapy in this 65 year old male EXAM: CT CHEST, ABDOMEN, AND PELVIS WITH CONTRAST TECHNIQUE: Multidetector CT imaging of the chest, abdomen and pelvis was performed following the standard protocol during bolus administration of intravenous contrast. CONTRAST:  144mL OMNIPAQUE IOHEXOL 300 MG/ML  SOLN COMPARISON:  June 24, 2020, CT of the chest and abdomen and pelvis study also from June 24, 2020 FINDINGS: CT CHEST FINDINGS Cardiovascular: RIGHT-sided Port-A-Cath in situ terminating at the caval to atrial junction. Calcified and noncalcified plaque, mild throughout the thoracic aorta. Ascending thoracic aorta at 3.7 cm. Central pulmonary vasculature normal caliber, limited assessment on venous phase evaluation. Mediastinum/Nodes: Esophagus grossly normal. No mediastinal, thoracic inlet, axillary or hilar lymphadenopathy. No anterior mediastinal soft tissue. Lungs/Pleura: Mild biapical scarring. No effusion with resolution of moderate effusions seen on the previous exam. No consolidative changes. Airways are patent. Musculoskeletal: See below for full musculoskeletal details. No acute or destructive bone process about the bony thorax. CT ABDOMEN PELVIS FINDINGS Hepatobiliary: No focal, suspicious hepatic lesion. No pericholecystic stranding. No biliary duct dilation. Pancreas: Pancreas without focal lesion or ductal dilation. Peripancreatic stranding replaces abundant peripancreatic soft tissue seen on the previous exam. Stranding relates to post treatment changes. Stranding is also seen tracking into the small bowel mesentery beneath the pancreas at the site of abundant soft tissue seen on the previous exam,  see below. Spleen: Normal size spleen. No focal splenic lesion or  perisplenic soft tissue. Adrenals/Urinary Tract: Adrenal glands are normal. Symmetric renal enhancement. No hydronephrosis. Urinary bladder with smooth contours. Stomach/Bowel: Gastrointestinal tract without acute process. Small bowel mesentery with soft tissue as described. Positive contrast media partially fills the small bowel. No bowel obstruction.  Appendix is normal. Vascular/Lymphatic: Calcified and noncalcified atheromatous plaque in the abdominal aorta as before. 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 Stable focal, chronic non flow limiting dissection in the superior mesenteric artery with associated mild dilation (image 74, series 2). Soft tissue in the retroperitoneum at the site of bulky retroperitoneal soft tissue on the prior study markedly improved when compared to the previous exam. Scattered small lymph nodes amidst confluent soft tissue that is markedly diminished. (Image 81, series 2) previously there was bulky soft tissue surrounding the aorta it measured approximately 11 x 4.7 cm greatest axial dimension. This is below the level of the renal veins. 2.1 cm greatest AP dimension noted in the LEFT periaortic chain representing 2 nearly discrete lymph nodes in this location. Intra-aortocaval lymph nodes measuring 11 mm. The soft tissue is no longer confluent at this level. At the iliac bifurcation confluent stranding is noted without discrete lymph nodes. Along the LEFT common iliac region there is 1.6 x 1.5 cm stranding (image 94, series 2) previously this area measuring approximately 4.7 x 4.4 cm. At the root of the small bowel mesentery (image 83, series 2) less confluent soft tissue as well with stranding in the mesentery still with some soft tissue characteristics but much less dense than on the prior study 8.4 x 2.8 cm, at this site previously soft tissue measuring as much as 15 x  11 cm. This stranding tracks throughout jejunal mesenteric reflections more so than other areas and is globally diminished. Pelvic nodal disease with even more pronounced decrease. 8 mm to 9 mm rind of soft tissue along LEFT common iliac and external iliac branches at the site of bulky nodal disease that measured as much as 3.5 cm short axis dimension at this location on the prior exam No new discrete lymph nodes or worsening soft tissue in the abdomen or pelvis. Reproductive: Unremarkable Other: No ascites, resolution of ascites seen on the previous study. Small fat containing umbilical hernia. Musculoskeletal: No destructive bone process. Spinal degenerative changes. IMPRESSION: 1. Marked interval response to therapy now with confluent stranding at the site of bulky soft tissue seen in the abdomen and pelvis on the prior study also with resolution of ascites and pleural effusions. 2. Small discrete lymph nodes CT now be visualized in the retroperitoneum. No new signs of nodal disease. Attention on follow-up. 3. Resolution of disease in the chest including pleural effusion mentioned above. 4. Aortic atherosclerosis. 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 Aortic Atherosclerosis (ICD10-I70.0). Electronically Signed   By: Zetta Bills M.D.   On: 09/14/2020 08:22       ASSESSMENT & PLAN:   65 yo with   1) Newly diagnosed advanced Stage III/IV high grade follicular lymphoma S/p C4 of R-CHOP 2) Abdominal distension with chylous ascites -- resolving.  PLAN: -Discussed pt labwork today, 09/22/20; blood counts and chemistries look stable, LDH and Mg stable -Discussed 09/13/2020 CT C/A/P; good  response to treatment after 4 cycles -There are no  prohibitive toxicities from continuing C5 R-CHOP at this time. -Recommended that the pt continue to eat well, drink at least 48-64 oz of water each day, and walk 20-30 minutes each day.  -Recommended pt drink more water earlier in day to reduce increased urination at night -Will take urine sample for testing today for UTI. -Will see back in 3 weeks for C6D1.   FOLLOW UP: Plz schedule next cycle of R-CHOP with portflush, labs and MD visit in 3 weeks as ordered   The total time spent in the appt was 30 minutes and more than 50% was on counseling and direct patient cares, ordering and mx of chemotherapy  All of the patient's questions were answered with apparent satisfaction. The patient knows to call the clinic with any problems, questions or concerns.    George Lone MD [place] AAHIVMS [facility] Hematology/Oncology Physician [facility]  (Office):       [phone] (Work cell):  [phone] (Fax):           [phone]  09/22/2020 7:24 AM  I, Yevette Edwards, am acting as a scribe for Dr. Sullivan Proctor.   .I have reviewed the above documentation for accuracy and completeness, and I agree with the above. Brunetta Genera MD

## 2020-09-25 ENCOUNTER — Other Ambulatory Visit: Payer: Self-pay

## 2020-09-25 ENCOUNTER — Inpatient Hospital Stay: Payer: 59

## 2020-09-25 VITALS — BP 122/85 | HR 100 | Temp 98.3°F | Resp 16

## 2020-09-25 DIAGNOSIS — C8248 Follicular lymphoma grade IIIb, lymph nodes of multiple sites: Secondary | ICD-10-CM

## 2020-09-25 DIAGNOSIS — R350 Frequency of micturition: Secondary | ICD-10-CM | POA: Diagnosis not present

## 2020-09-25 DIAGNOSIS — Z7189 Other specified counseling: Secondary | ICD-10-CM

## 2020-09-25 DIAGNOSIS — Z5112 Encounter for antineoplastic immunotherapy: Secondary | ICD-10-CM | POA: Diagnosis not present

## 2020-09-25 DIAGNOSIS — Z5111 Encounter for antineoplastic chemotherapy: Secondary | ICD-10-CM | POA: Diagnosis not present

## 2020-09-25 DIAGNOSIS — Z5189 Encounter for other specified aftercare: Secondary | ICD-10-CM | POA: Diagnosis not present

## 2020-09-25 MED ORDER — PEGFILGRASTIM-CBQV 6 MG/0.6ML ~~LOC~~ SOSY
6.0000 mg | PREFILLED_SYRINGE | Freq: Once | SUBCUTANEOUS | Status: AC
Start: 2020-09-25 — End: 2020-09-25
  Administered 2020-09-25: 6 mg via SUBCUTANEOUS

## 2020-09-25 MED ORDER — PEGFILGRASTIM-CBQV 6 MG/0.6ML ~~LOC~~ SOSY
PREFILLED_SYRINGE | SUBCUTANEOUS | Status: AC
  Filled 2020-09-25: qty 0.6

## 2020-09-25 NOTE — Patient Instructions (Signed)
Pegfilgrastim injection What is this medicine? PEGFILGRASTIM (PEG fil gra stim) is a long-acting granulocyte colony-stimulating factor that stimulates the growth of neutrophils, a type of white blood cell important in the body's fight against infection. It is used to reduce the incidence of fever and infection in patients with certain types of cancer who are receiving chemotherapy that affects the bone marrow, and to increase survival after being exposed to high doses of radiation. This medicine may be used for other purposes; ask your health care provider or pharmacist if you have questions. COMMON BRAND NAME(S): Fulphila, Neulasta, Nyvepria, UDENYCA, Ziextenzo What should I tell my health care provider before I take this medicine? They need to know if you have any of these conditions:  kidney disease  latex allergy  ongoing radiation therapy  sickle cell disease  skin reactions to acrylic adhesives (On-Body Injector only)  an unusual or allergic reaction to pegfilgrastim, filgrastim, other medicines, foods, dyes, or preservatives  pregnant or trying to get pregnant  breast-feeding How should I use this medicine? This medicine is for injection under the skin. If you get this medicine at home, you will be taught how to prepare and give the pre-filled syringe or how to use the On-body Injector. Refer to the patient Instructions for Use for detailed instructions. Use exactly as directed. Tell your healthcare provider immediately if you suspect that the On-body Injector may not have performed as intended or if you suspect the use of the On-body Injector resulted in a missed or partial dose. It is important that you put your used needles and syringes in a special sharps container. Do not put them in a trash can. If you do not have a sharps container, call your pharmacist or healthcare provider to get one. Talk to your pediatrician regarding the use of this medicine in children. While this drug  may be prescribed for selected conditions, precautions do apply. Overdosage: If you think you have taken too much of this medicine contact a poison control center or emergency room at once. NOTE: This medicine is only for you. Do not share this medicine with others. What if I miss a dose? It is important not to miss your dose. Call your doctor or health care professional if you miss your dose. If you miss a dose due to an On-body Injector failure or leakage, a new dose should be administered as soon as possible using a single prefilled syringe for manual use. What may interact with this medicine? Interactions have not been studied. This list may not describe all possible interactions. Give your health care provider a list of all the medicines, herbs, non-prescription drugs, or dietary supplements you use. Also tell them if you smoke, drink alcohol, or use illegal drugs. Some items may interact with your medicine. What should I watch for while using this medicine? Your condition will be monitored carefully while you are receiving this medicine. You may need blood work done while you are taking this medicine. Talk to your health care provider about your risk of cancer. You may be more at risk for certain types of cancer if you take this medicine. If you are going to need a MRI, CT scan, or other procedure, tell your doctor that you are using this medicine (On-Body Injector only). What side effects may I notice from receiving this medicine? Side effects that you should report to your doctor or health care professional as soon as possible:  allergic reactions (skin rash, itching or hives, swelling of   the face, lips, or tongue)  back pain  dizziness  fever  pain, redness, or irritation at site where injected  pinpoint red spots on the skin  red or dark-brown urine  shortness of breath or breathing problems  stomach or side pain, or pain at the shoulder  swelling  tiredness  trouble  passing urine or change in the amount of urine  unusual bruising or bleeding Side effects that usually do not require medical attention (report to your doctor or health care professional if they continue or are bothersome):  bone pain  muscle pain This list may not describe all possible side effects. Call your doctor for medical advice about side effects. You may report side effects to FDA at 1-800-FDA-1088. Where should I keep my medicine? Keep out of the reach of children. If you are using this medicine at home, you will be instructed on how to store it. Throw away any unused medicine after the expiration date on the label. NOTE: This sheet is a summary. It may not cover all possible information. If you have questions about this medicine, talk to your doctor, pharmacist, or health care provider.  2021 Elsevier/Gold Standard (2019-09-24 13:20:51)  

## 2020-09-28 DIAGNOSIS — Z79899 Other long term (current) drug therapy: Secondary | ICD-10-CM | POA: Diagnosis not present

## 2020-09-28 DIAGNOSIS — R69 Illness, unspecified: Secondary | ICD-10-CM | POA: Diagnosis not present

## 2020-09-28 DIAGNOSIS — C8590 Non-Hodgkin lymphoma, unspecified, unspecified site: Secondary | ICD-10-CM | POA: Diagnosis not present

## 2020-09-28 DIAGNOSIS — F411 Generalized anxiety disorder: Secondary | ICD-10-CM | POA: Diagnosis not present

## 2020-09-28 DIAGNOSIS — F900 Attention-deficit hyperactivity disorder, predominantly inattentive type: Secondary | ICD-10-CM | POA: Diagnosis not present

## 2020-10-03 ENCOUNTER — Other Ambulatory Visit: Payer: Self-pay | Admitting: Hematology

## 2020-10-12 DIAGNOSIS — F900 Attention-deficit hyperactivity disorder, predominantly inattentive type: Secondary | ICD-10-CM | POA: Diagnosis not present

## 2020-10-12 DIAGNOSIS — Z79899 Other long term (current) drug therapy: Secondary | ICD-10-CM | POA: Diagnosis not present

## 2020-10-12 DIAGNOSIS — C8590 Non-Hodgkin lymphoma, unspecified, unspecified site: Secondary | ICD-10-CM | POA: Diagnosis not present

## 2020-10-12 DIAGNOSIS — F411 Generalized anxiety disorder: Secondary | ICD-10-CM | POA: Diagnosis not present

## 2020-10-12 DIAGNOSIS — R69 Illness, unspecified: Secondary | ICD-10-CM | POA: Diagnosis not present

## 2020-10-12 NOTE — Progress Notes (Signed)
HEMATOLOGY/ONCOLOGY CLINIC NOTE  Date of Service: 10/12/2020  Patient Care Team: [name], MD as PCP - General (Internal Medicine) Debara Pickett Nadean Corwin, MD as PCP - Cardiology (Cardiology) [name], MD (Internal Medicine)  CHIEF COMPLAINTS/PURPOSE OF CONSULTATION:  Mx of high grade follicular lymphoma  HISTORY OF PRESENTING ILLNESS:   MALIEK DUNDEE is a wonderful 65 y.o. male who has been referred to [place] by Dr. Shelia Media for evaluation and management of possible wide-spread lymphoma - palpable mass in abdomen. Pt is accompanied today by his wife. The pt reports that he is doing well overall.   The pt reports that about three weeks ago he began experiencing abdominal fullness. Two weeks ago he began to feel abdominal pain. Lying down makes this discomfort worse. He has not had a restful sleep in two weeks. The abdominal fullness has caused lack of appetite, belching, and one episode of nausea and vomiting. He is currently eating 1/3-1/2 of his baseline. Pt has been taking Tramadol to control his pain, as well as a stool softener. His bowel movements look different, but he denies any constipation. He is hot natured and has mild night sweats, but denies drenching night sweats. Pt feels that he has been short of breath for awhile, but that this may have worsened lately. Pt follows with Dr. Benson Norway, GI.   He had abdominal surgery as a child after an accident. Pt has had GI bleeding and ulcers from taking NSAIDS. Pt has had several instances of abdominal swelling that were triggered by illness. Pt had esophageal dialation. His strictures were thought to be caused by acid reflux. He also has an umbilical hernia. He has chronic back pain. Pt was diagnosed with chronic lyme disease by a [place]. He has a history of balance issues and has brought this up to his PCP, who did not feel that it required a work up. He does not have a true allergy, but experiences nausea and vomiting when  taking Omeprazole. Pt denies any international travel in the last year.   Pt has been taking weekly Testosterone for 3-4 months. He was experiencing fatigue prior to beginning the hormonal injections. Pt was placed on Latuda & Lamictil several weeks ago for his Depression and Anxiety, but discontinued a week ago as he did not see any improvement in mood. He is not currently following with a mental health professional.   Dr. Shelia Media referred pt to Dr. Redmond Pulling at [facility] for biopsy consideration. Pt is scheduled to see Dr. Shelia Media next Wednesday.   Of note prior to the patient's visit today, pt has had  CT Abd/Pel completed on 06/05/2020 with results revealing numerous cardiophrenic, retroperitoneal and mesenteric adenopathy most consistent with lymphoma. Small volume of ascites. Hepatosplenomegaly.  Most recent lab results (03/22/2020) of CBC is as follows: all values are WNL except for WBC at 3.5K, PLT at 131K, Lymphs Rel at 17.6, Total Protein at 6.3.  On review of systems, pt reports worsening SOB, abdominal pain, abdominal fullness, low appetite, dyspepsia, mild night sweats and denies fevers, chills, rash, headaches, leg swelling, urinary habit changes, constipation, testicular pain/swelling and any other symptoms.   On PMHx the pt reports Depression, Anxiety, Abdominal Surgery, Gait difficulty, Esophageal dilation. On Family Hx the pt reports that his father and paternal aunt had polymyalgia rheumatica. His paternal aunt also had breast cancer.  INTERVAL HISTORY:  MASTER MCKEE is a wonderful 65 y.o. male who is here for evaluation and management of lymphoma.  He is here for C6 of R-CHOP. The patient's last visit with [place] was on 09/22/2020. Pt is accompanied today by his wife.The pt reports that he is doing well overall.  The pt reports that his fatigue and muscle soreness in his lower extremities and arms has worsened since the last visit with C5. The pt notes that he experienced  SOB intermittently that occurred when he was resting and was built up and lasted around an hour. He felt as though his breathing was more laborious, but denies palpatations, wheezing, coughing, dizziness, and lightheadedness. This was improve by resting. Since this one event, the symptoms have not caused any problems.  The pt reports peptic systems for several days around two weeks ago. He notes continuous burping and bloated feeling. This has since improved. The pt notes that he takes Compazine for nausea prn.  Lab results today 10/13/2020 of CBC w/diff and CMP is as follows: all values are WNL except for Glucose of 109, Total Protein of 6.0, RBC of 4.10, Hgb of 12.2, HCT of 38.3, RDW of 17.4, Lymph Abs of 0.4K. 10/13/2020 LDH of 232.  On review of systems, pt reports fatigue and denies constipation, n/v/d, fevers, chills, night sweats, abdominal distention, new lumps/bumps, skin rashes, acute unexplained SOB, back pain, abdominal pain and any other symptoms.  MEDICAL HISTORY:  Past Medical History:  Diagnosis Date  . Anxiety   . Depression   . Gait difficulty   . Lymphoma ([place])   . Memory loss    SURGICAL HISTORY: Past Surgical History:  Procedure Laterality Date  . ABDOMINAL SURGERY     Childhood  . BUNIONECTOMY    . IR IMAGING GUIDED PORT INSERTION  06/30/2020  . IR PARACENTESIS  06/26/2020  . IR US GUIDE BX ASP/DRAIN  06/26/2020    SOCIAL HISTORY: Social History   Socioeconomic History  . Marital status: Married    Spouse name: Not on file  . Number of children: 0  . Years of education: Veterinary surgeon  . Highest education level: Not on file  Occupational History  . Occupation: Scientist, research (physical sciences)  Tobacco Use  . Smoking status: Never Smoker  . Smokeless tobacco: Never Used  Vaping Use  . Vaping Use: Never used  Substance and Sexual Activity  . Alcohol use: No    Alcohol/week: 0.0 standard drinks    Comment: Less than one drink per week.  . Drug use:  No  . Sexual activity: Not Currently  Other Topics Concern  . Not on file  Social History Narrative   Lives at home with wife.   Right-handed.   3 cups caffeine per day.   Social Determinants of Health   Financial Resource Strain: Not on file  Food Insecurity: Not on file  Transportation Needs: Not on file  Physical Activity: Not on file  Stress: Not on file  Social Connections: Not on file  Intimate Partner Violence: Not on file    FAMILY HISTORY: Family History  Problem Relation Age of Onset  . Aneurysm Mother 55       Thoracic  . Hypertension Father   . Heart failure Father   . Heart disease Father   . Heart attack Paternal Grandfather   . Depression Cousin   . Bipolar disorder Cousin   . Schizophrenia Maternal Uncle     ALLERGIES:  is allergic to omeprazole.  MEDICATIONS:  Current Outpatient Medications  Medication Sig Dispense Refill  . allopurinol (ZYLOPRIM) 100 MG tablet Take 1  tablet (100 mg total) by mouth 2 (two) times daily. 60 tablet 2  . Ascorbic Acid (VITAMIN C) 1000 MG tablet Take 1,000 mg by mouth daily.    . B Complex Vitamins (VITAMIN B COMPLEX PO) Take 1 tablet by mouth daily.     Marland Kitchen lidocaine-prilocaine (EMLA) cream Apply to affected area once 30 g 3  . LORazepam (ATIVAN) 0.5 MG tablet Take 1 tablet (0.5 mg total) by mouth every 6 (six) hours as needed (Nausea or vomiting). 30 tablet 0  . Multiple Vitamin (MULTIVITAMIN) capsule Take 1 capsule by mouth daily.    . ondansetron (ZOFRAN) 8 MG tablet Take 1 tablet (8 mg total) by mouth every 8 (eight) hours as needed for vomiting or refractory nausea / vomiting. Start on day 3 after cyclophosphamide chemotherapy. 30 tablet 1  . oxyCODONE (OXY IR/ROXICODONE) 5 MG immediate release tablet Take 1 tablet (5 mg total) by mouth every 4 (four) hours as needed for severe pain. 30 tablet 0  . pantoprazole (PROTONIX) 40 MG tablet Take 40 mg by mouth 2 (two) times daily as needed (indigestion).     . polyethylene  glycol (MIRALAX) 17 g packet Take 17 g by mouth daily. 30 each 1  . predniSONE (DELTASONE) 20 MG tablet Prednisone 60 mg po daily for 5 days with each cycle of R-CHOP. Take with food. 15 tablet 3  . prochlorperazine (COMPAZINE) 10 MG tablet Take 1 tablet (10 mg total) by mouth every 6 (six) hours as needed (Nausea or vomiting). 30 tablet 6  . senna-docusate (SENNA S) 8.6-50 MG tablet Take 2 tablets by mouth at bedtime. 60 tablet 1  . testosterone cypionate (DEPOTESTOSTERONE CYPIONATE) 200 MG/ML injection Inject into the muscle once a week.     . zolpidem (AMBIEN CR) 12.5 MG CR tablet TAKE 1 TABLET BY MOUTH AT BEDTIME AS NEEDED FOR SLEEP. 30 tablet 0   No current facility-administered medications for this visit.    REVIEW OF SYSTEMS:   10 Point review of Systems was done is negative except as noted above.  PHYSICAL EXAMINATION: ECOG PERFORMANCE STATUS: 2 - Symptomatic, <50% confined to bed  . Vitals:   10/13/20 1025  BP: (!) 212/78  Pulse: 98  Resp: 18  Temp: 97.8 F (36.6 C)  SpO2: 100%   Filed Weights   10/13/20 1025  Weight: 171 lb (77.6 kg)   .Body mass index is 22.56 kg/m.  GENERAL:alert, in no acute distress and comfortable SKIN: no acute rashes, no significant lesions EYES: conjunctiva are pink and non-injected, sclera anicteric OROPHARYNX: MMM, no exudates, no oropharyngeal erythema or ulceration NECK: supple, no JVD LYMPH:  no palpable lymphadenopathy in the cervical, axillary or inguinal regions LUNGS: clear to auscultation b/l with normal respiratory effort HEART: regular rate & rhythm ABDOMEN:  normoactive bowel sounds , non tender, not distended. Extremity: no pedal edema PSYCH: alert & oriented x 3 with fluent speech NEURO: no focal motor/sensory deficits  LABORATORY DATA:  I have reviewed the data as listed  . CBC Latest Ref Rng & Units 10/13/2020 09/22/2020 08/31/2020  WBC 4.0 - 10.5 K/uL 6.7 5.2 6.7  Hemoglobin 13.0 - 17.0 g/dL 12.2(L) 11.3(L) 11.7(L)   Hematocrit 39.0 - 52.0 % 38.3(L) 35.1(L) 35.6(L)  Platelets 150 - 400 K/uL 209 255 271    . CMP Latest Ref Rng & Units 10/13/2020 09/22/2020 08/31/2020  Glucose 70 - 99 mg/dL 109(H) 99 97  BUN 8 - 23 mg/dL 13 12 15   Creatinine 0.61 - 1.24 mg/dL  0.83 0.77 0.82  Sodium 135 - 145 mmol/L 140 141 143  Potassium 3.5 - 5.1 mmol/L 4.0 4.0 4.2  Chloride 98 - 111 mmol/L 108 107 108  CO2 22 - 32 mmol/L 26 27 27   Calcium 8.9 - 10.3 mg/dL 8.9 9.1 9.3  Total Protein 6.5 - 8.1 g/dL 6.0(L) 6.0(L) 6.0(L)  Total Bilirubin 0.3 - 1.2 mg/dL 0.4 0.5 0.4  Alkaline Phos 38 - 126 U/L 71 77 92  AST 15 - 41 U/L 20 22 20   ALT 0 - 44 U/L 26 24 17    . Lab Results  Component Value Date   LDH 232 (H) 10/13/2020    RADIOGRAPHIC STUDIES: I have personally reviewed the radiological images as listed and agreed with the findings in the report. CT CHEST ABDOMEN PELVIS W CONTRAST  Result Date: 09/14/2020 CLINICAL DATA:  Hematologic malignancy, history of chemo and immunotherapy in this 65 year old male EXAM: CT CHEST, ABDOMEN, AND PELVIS WITH CONTRAST TECHNIQUE: Multidetector CT imaging of the chest, abdomen and pelvis was performed following the standard protocol during bolus administration of intravenous contrast. CONTRAST:  155mL OMNIPAQUE IOHEXOL 300 MG/ML  SOLN COMPARISON:  June 24, 2020, CT of the chest and abdomen and pelvis study also from June 24, 2020 FINDINGS: CT CHEST FINDINGS Cardiovascular: RIGHT-sided Port-A-Cath in situ terminating at the caval to atrial junction. Calcified and noncalcified plaque, mild throughout the thoracic aorta. Ascending thoracic aorta at 3.7 cm. Central pulmonary vasculature normal caliber, limited assessment on venous phase evaluation. Mediastinum/Nodes: Esophagus grossly normal. No mediastinal, thoracic inlet, axillary or hilar lymphadenopathy. No anterior mediastinal soft tissue. Lungs/Pleura: Mild biapical scarring. No effusion with resolution of moderate effusions seen on the  previous exam. No consolidative changes. Airways are patent. Musculoskeletal: See below for full musculoskeletal details. No acute or destructive bone process about the bony thorax. CT ABDOMEN PELVIS FINDINGS Hepatobiliary: No focal, suspicious hepatic lesion. No pericholecystic stranding. No biliary duct dilation. Pancreas: Pancreas without focal lesion or ductal dilation. Peripancreatic stranding replaces abundant peripancreatic soft tissue seen on the previous exam. Stranding relates to post treatment changes. Stranding is also seen tracking into the small bowel mesentery beneath the pancreas at the site of abundant soft tissue seen on the previous exam, see below. Spleen: Normal size spleen. No focal splenic lesion or perisplenic soft tissue. Adrenals/Urinary Tract: Adrenal glands are normal. Symmetric renal enhancement. No hydronephrosis. Urinary bladder with smooth contours. Stomach/Bowel: Gastrointestinal tract without acute process. Small bowel mesentery with soft tissue as described. Positive contrast media partially fills the small bowel. No bowel obstruction.  Appendix is normal. Vascular/Lymphatic: Calcified and noncalcified atheromatous plaque in the abdominal aorta as before. 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 Stable focal, chronic non flow limiting dissection in the superior mesenteric artery with associated mild dilation (image 74, series 2). Soft tissue in the retroperitoneum at the site of bulky retroperitoneal soft tissue on the prior study markedly improved when compared to the previous exam. Scattered small lymph nodes amidst confluent soft tissue that is markedly diminished. (Image 81, series 2) previously there was bulky soft tissue surrounding the aorta it measured approximately 11 x 4.7 cm greatest axial dimension. This is below the level of the renal veins. 2.1 cm greatest AP dimension noted in the LEFT periaortic chain representing 2  nearly discrete lymph nodes in this location. Intra-aortocaval lymph nodes measuring 11 mm. The soft tissue is no longer confluent at this level. At the iliac bifurcation confluent stranding is  noted without discrete lymph nodes. Along the LEFT common iliac region there is 1.6 x 1.5 cm stranding (image 94, series 2) previously this area measuring approximately 4.7 x 4.4 cm. At the root of the small bowel mesentery (image 83, series 2) less confluent soft tissue as well with stranding in the mesentery still with some soft tissue characteristics but much less dense than on the prior study 8.4 x 2.8 cm, at this site previously soft tissue measuring as much as 15 x 11 cm. This stranding tracks throughout jejunal mesenteric reflections more so than other areas and is globally diminished. Pelvic nodal disease with even more pronounced decrease. 8 mm to 9 mm rind of soft tissue along LEFT common iliac and external iliac branches at the site of bulky nodal disease that measured as much as 3.5 cm short axis dimension at this location on the prior exam No new discrete lymph nodes or worsening soft tissue in the abdomen or pelvis. Reproductive: Unremarkable Other: No ascites, resolution of ascites seen on the previous study. Small fat containing umbilical hernia. Musculoskeletal: No destructive bone process. Spinal degenerative changes. IMPRESSION: 1. Marked interval response to therapy now with confluent stranding at the site of bulky soft tissue seen in the abdomen and pelvis on the prior study also with resolution of ascites and pleural effusions. 2. Small discrete lymph nodes CT now be visualized in the retroperitoneum. No new signs of nodal disease. Attention on follow-up. 3. Resolution of disease in the chest including pleural effusion mentioned above. 4. Aortic atherosclerosis. 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 Aortic Atherosclerosis (ICD10-I70.0). Electronically Signed   By: Zetta Bills M.D.   On: 09/14/2020 08:22       ASSESSMENT & PLAN:   65 yo with   1) Recently diagnosed advanced Stage III/IV high grade follicular lymphoma S/p C5 of R-CHOP  PLAN: -Discussed pt labwork today, 10/13/2020; blood counts holding steading. Expected Lymphopenic. LDH higher but not pt baseline due to G-CSF support. -Recommend pt take Probiotic or active culture yogurt for diarrhea. -Advised pt we will get repeat scans in 2-3 months and discuss future plan then as this is his last cycle. Will determine if monitor or maintenance treatment. Monitoring versus maintenance.  -Advise pt of difference between maintenance treatments of low grade vs high grade FL. -Recommend pt eat a healthy diet, walk 20-30 min daily, and drink 48-64 oz. -Recommend pt use waist abd binder for comfort in regard to umbilical hernia pain.  -Advise pt to wait 3 months following chemo if bothersome and 6 months if tolerable prior to umbilical hernia surgery. The pt agrees to wait 6 months. -Discussed effects of anti-depressant medications. Advised pt to f/u w Psychiatrist regarding these concerns. The pt is agreeable to this. Recommend [facility] by Betha Loa. -There are no prohibitive toxicities from continuing C6 R-CHOP at this time. This is the last scheduled treatment.  -Will see back in 3 months with labs.  2) Abdominal distension with chylous ascites -- resolving.   FOLLOW UP: CT chest /abd/pelvis in 12 weeks Portflush and labs in 12 weeks MD visit in 14 weeks   The total time spent in the appointment was 35 minutes and more than 50% was on  counseling and direct patient cares.   All of the patient's questions were answered with apparent satisfaction. The patient  knows to call the clinic with any problems, questions or concerns.    Sullivan Lone MD [place] AAHIVMS [facility] Hematology/Oncology Physician [facility]  (Office):       [phone] (Work cell):  [phone] (Fax):           [phone]  10/12/2020 12:59 PM  I, Reinaldo Raddle, am acting as scribe for Dr. Sullivan Lone, MD.       .I have reviewed the above documentation for accuracy and completeness, and I agree with the above. Brunetta Genera MD

## 2020-10-13 ENCOUNTER — Inpatient Hospital Stay: Payer: 59 | Admitting: Hematology

## 2020-10-13 ENCOUNTER — Inpatient Hospital Stay: Payer: 59

## 2020-10-13 ENCOUNTER — Other Ambulatory Visit: Payer: Self-pay

## 2020-10-13 VITALS — BP 212/78 | HR 98 | Temp 97.8°F | Resp 18 | Ht 73.0 in | Wt 171.0 lb

## 2020-10-13 VITALS — BP 122/80 | HR 83 | Temp 98.4°F | Resp 16

## 2020-10-13 DIAGNOSIS — Z5189 Encounter for other specified aftercare: Secondary | ICD-10-CM | POA: Diagnosis not present

## 2020-10-13 DIAGNOSIS — Z5111 Encounter for antineoplastic chemotherapy: Secondary | ICD-10-CM

## 2020-10-13 DIAGNOSIS — Z95828 Presence of other vascular implants and grafts: Secondary | ICD-10-CM

## 2020-10-13 DIAGNOSIS — C8248 Follicular lymphoma grade IIIb, lymph nodes of multiple sites: Secondary | ICD-10-CM

## 2020-10-13 DIAGNOSIS — Z5112 Encounter for antineoplastic immunotherapy: Secondary | ICD-10-CM | POA: Diagnosis not present

## 2020-10-13 DIAGNOSIS — R350 Frequency of micturition: Secondary | ICD-10-CM | POA: Diagnosis not present

## 2020-10-13 DIAGNOSIS — Z7189 Other specified counseling: Secondary | ICD-10-CM

## 2020-10-13 LAB — CMP (CANCER CENTER ONLY)
ALT: 26 U/L (ref 0–44)
AST: 20 U/L (ref 15–41)
Albumin: 4 g/dL (ref 3.5–5.0)
Alkaline Phosphatase: 71 U/L (ref 38–126)
Anion gap: 6 (ref 5–15)
BUN: 13 mg/dL (ref 8–23)
CO2: 26 mmol/L (ref 22–32)
Calcium: 8.9 mg/dL (ref 8.9–10.3)
Chloride: 108 mmol/L (ref 98–111)
Creatinine: 0.83 mg/dL (ref 0.61–1.24)
GFR, Estimated: >60 mL/min (ref >60)
Glucose, Bld: 109 mg/dL — ABNORMAL HIGH (ref 70–99)
Potassium: 4 mmol/L (ref 3.5–5.1)
Sodium: 140 mmol/L (ref 135–145)
Total Bilirubin: 0.4 mg/dL (ref 0.3–1.2)
Total Protein: 6 g/dL — ABNORMAL LOW (ref 6.5–8.1)

## 2020-10-13 LAB — CBC WITH DIFFERENTIAL/PLATELET
Abs Immature Granulocytes: 0.07 10*3/uL (ref 0.00–0.07)
Basophils Absolute: 0 10*3/uL (ref 0.0–0.1)
Basophils Relative: 0 %
Eosinophils Absolute: 0 10*3/uL (ref 0.0–0.5)
Eosinophils Relative: 0 %
HCT: 38.3 % — ABNORMAL LOW (ref 39.0–52.0)
Hemoglobin: 12.2 g/dL — ABNORMAL LOW (ref 13.0–17.0)
Immature Granulocytes: 1 %
Lymphocytes Relative: 6 %
Lymphs Abs: 0.4 10*3/uL — ABNORMAL LOW (ref 0.7–4.0)
MCH: 29.8 pg (ref 26.0–34.0)
MCHC: 31.9 g/dL (ref 30.0–36.0)
MCV: 93.4 fL (ref 80.0–100.0)
Monocytes Absolute: 0.8 10*3/uL (ref 0.1–1.0)
Monocytes Relative: 11 %
Neutro Abs: 5.4 10*3/uL (ref 1.7–7.7)
Neutrophils Relative %: 82 %
Platelets: 209 10*3/uL (ref 150–400)
RBC: 4.1 MIL/uL — ABNORMAL LOW (ref 4.22–5.81)
RDW: 17.4 % — ABNORMAL HIGH (ref 11.5–15.5)
WBC: 6.7 10*3/uL (ref 4.0–10.5)
nRBC: 0 % (ref 0.0–0.2)

## 2020-10-13 LAB — LACTATE DEHYDROGENASE: LDH: 232 U/L — ABNORMAL HIGH (ref 98–192)

## 2020-10-13 MED ORDER — ACETAMINOPHEN 325 MG PO TABS
650.0000 mg | ORAL_TABLET | Freq: Once | ORAL | Status: AC
  Administered 2020-10-13: 650 mg via ORAL

## 2020-10-13 MED ORDER — SODIUM CHLORIDE 0.9% FLUSH
10.0000 mL | INTRAVENOUS | Status: DC | PRN
  Administered 2020-10-13: 10 mL
  Filled 2020-10-13: qty 10

## 2020-10-13 MED ORDER — PALONOSETRON HCL INJECTION 0.25 MG/5ML
0.2500 mg | Freq: Once | INTRAVENOUS | Status: AC
  Administered 2020-10-13: 0.25 mg via INTRAVENOUS

## 2020-10-13 MED ORDER — DIPHENHYDRAMINE HCL 25 MG PO CAPS
50.0000 mg | ORAL_CAPSULE | Freq: Once | ORAL | Status: AC
  Administered 2020-10-13: 50 mg via ORAL

## 2020-10-13 MED ORDER — VINCRISTINE SULFATE CHEMO INJECTION 1 MG/ML
2.0000 mg | Freq: Once | INTRAVENOUS | Status: AC
  Administered 2020-10-13: 2 mg via INTRAVENOUS
  Filled 2020-10-13: qty 2

## 2020-10-13 MED ORDER — SODIUM CHLORIDE 0.9 % IV SOLN
150.0000 mg | Freq: Once | INTRAVENOUS | Status: AC
  Administered 2020-10-13: 150 mg via INTRAVENOUS
  Filled 2020-10-13: qty 150

## 2020-10-13 MED ORDER — FAMOTIDINE IN NACL 20-0.9 MG/50ML-% IV SOLN
INTRAVENOUS | Status: AC
  Filled 2020-10-13: qty 50

## 2020-10-13 MED ORDER — DIPHENHYDRAMINE HCL 25 MG PO CAPS
ORAL_CAPSULE | ORAL | Status: AC
  Filled 2020-10-13: qty 2

## 2020-10-13 MED ORDER — SODIUM CHLORIDE 0.9% FLUSH
10.0000 mL | INTRAVENOUS | Status: DC | PRN
  Filled 2020-10-13: qty 10

## 2020-10-13 MED ORDER — SODIUM CHLORIDE 0.9 % IV SOLN
750.0000 mg/m2 | Freq: Once | INTRAVENOUS | Status: AC
  Administered 2020-10-13: 1580 mg via INTRAVENOUS
  Filled 2020-10-13: qty 79

## 2020-10-13 MED ORDER — ACETAMINOPHEN 325 MG PO TABS
ORAL_TABLET | ORAL | Status: AC
  Filled 2020-10-13: qty 2

## 2020-10-13 MED ORDER — SODIUM CHLORIDE 0.9 % IV SOLN
375.0000 mg/m2 | Freq: Once | INTRAVENOUS | Status: AC
  Administered 2020-10-13: 800 mg via INTRAVENOUS
  Filled 2020-10-13: qty 50

## 2020-10-13 MED ORDER — SODIUM CHLORIDE 0.9 % IV SOLN
Freq: Once | INTRAVENOUS | Status: AC
  Filled 2020-10-13: qty 250

## 2020-10-13 MED ORDER — PALONOSETRON HCL INJECTION 0.25 MG/5ML
INTRAVENOUS | Status: AC
  Filled 2020-10-13: qty 5

## 2020-10-13 MED ORDER — DOXORUBICIN HCL CHEMO IV INJECTION 2 MG/ML
50.0000 mg/m2 | Freq: Once | INTRAVENOUS | Status: AC
  Administered 2020-10-13: 106 mg via INTRAVENOUS
  Filled 2020-10-13: qty 53

## 2020-10-13 MED ORDER — HEPARIN SOD (PORK) LOCK FLUSH 100 UNIT/ML IV SOLN
500.0000 [IU] | Freq: Once | INTRAVENOUS | Status: DC | PRN
  Filled 2020-10-13: qty 5

## 2020-10-13 MED ORDER — FAMOTIDINE IN NACL 20-0.9 MG/50ML-% IV SOLN
20.0000 mg | Freq: Once | INTRAVENOUS | Status: AC
  Administered 2020-10-13: 20 mg via INTRAVENOUS

## 2020-10-13 MED ORDER — SODIUM CHLORIDE 0.9 % IV SOLN
10.0000 mg | Freq: Once | INTRAVENOUS | Status: AC
  Administered 2020-10-13: 10 mg via INTRAVENOUS
  Filled 2020-10-13: qty 10

## 2020-10-13 NOTE — Patient Instructions (Signed)
[facility] Discharge Instructions for Patients Receiving Chemotherapy  Today you received the following chemotherapy agents: doxorubicin/vincristine/cyclophosphamide/rituximab.  To help prevent nausea and vomiting after your treatment, we encourage you to take your nausea medication as directed.   If you develop nausea and vomiting that is not controlled by your nausea medication, call the clinic.   BELOW ARE SYMPTOMS THAT SHOULD BE REPORTED IMMEDIATELY:  *FEVER GREATER THAN 100.5 F  *CHILLS WITH OR WITHOUT FEVER  NAUSEA AND VOMITING THAT IS NOT CONTROLLED WITH YOUR NAUSEA MEDICATION  *UNUSUAL SHORTNESS OF BREATH  *UNUSUAL BRUISING OR BLEEDING  TENDERNESS IN MOUTH AND THROAT WITH OR WITHOUT PRESENCE OF ULCERS  *URINARY PROBLEMS  *BOWEL PROBLEMS  UNUSUAL RASH Items with * indicate a potential emergency and should be followed up as soon as possible.  Feel free to call the clinic should you have any questions or concerns. The clinic phone number is [phone].  Please show the CHEMO ALERT CARD at check-in to the Emergency Department and triage nurse.   

## 2020-10-13 NOTE — Progress Notes (Signed)
Pt discharged in no apparent distress. Pt left ambulatory without assistance. Pt aware of discharge instructions and verbalized understanding and had no further questions.  

## 2020-10-13 NOTE — Patient Instructions (Signed)

## 2020-10-16 ENCOUNTER — Other Ambulatory Visit: Payer: Self-pay

## 2020-10-16 ENCOUNTER — Inpatient Hospital Stay: Payer: 59

## 2020-10-16 VITALS — BP 120/80 | HR 102 | Temp 99.0°F | Resp 18

## 2020-10-16 DIAGNOSIS — Z7189 Other specified counseling: Secondary | ICD-10-CM

## 2020-10-16 DIAGNOSIS — C8248 Follicular lymphoma grade IIIb, lymph nodes of multiple sites: Secondary | ICD-10-CM | POA: Diagnosis not present

## 2020-10-16 DIAGNOSIS — Z5189 Encounter for other specified aftercare: Secondary | ICD-10-CM | POA: Diagnosis not present

## 2020-10-16 DIAGNOSIS — R350 Frequency of micturition: Secondary | ICD-10-CM | POA: Diagnosis not present

## 2020-10-16 DIAGNOSIS — Z5112 Encounter for antineoplastic immunotherapy: Secondary | ICD-10-CM | POA: Diagnosis not present

## 2020-10-16 DIAGNOSIS — Z5111 Encounter for antineoplastic chemotherapy: Secondary | ICD-10-CM | POA: Diagnosis not present

## 2020-10-16 MED ORDER — PEGFILGRASTIM-CBQV 6 MG/0.6ML ~~LOC~~ SOSY
6.0000 mg | PREFILLED_SYRINGE | Freq: Once | SUBCUTANEOUS | Status: AC
  Administered 2020-10-16: 6 mg via SUBCUTANEOUS

## 2020-10-16 MED ORDER — PEGFILGRASTIM-CBQV 6 MG/0.6ML ~~LOC~~ SOSY
PREFILLED_SYRINGE | SUBCUTANEOUS | Status: AC
  Filled 2020-10-16: qty 0.6

## 2020-10-16 NOTE — Patient Instructions (Signed)
Pegfilgrastim injection What is this medicine? PEGFILGRASTIM (PEG fil gra stim) is a long-acting granulocyte colony-stimulating factor that stimulates the growth of neutrophils, a type of white blood cell important in the body's fight against infection. It is used to reduce the incidence of fever and infection in patients with certain types of cancer who are receiving chemotherapy that affects the bone marrow, and to increase survival after being exposed to high doses of radiation. This medicine may be used for other purposes; ask your health care provider or pharmacist if you have questions. COMMON BRAND NAME(S): Fulphila, Neulasta, Nyvepria, UDENYCA, Ziextenzo What should I tell my health care provider before I take this medicine? They need to know if you have any of these conditions:  kidney disease  latex allergy  ongoing radiation therapy  sickle cell disease  skin reactions to acrylic adhesives (On-Body Injector only)  an unusual or allergic reaction to pegfilgrastim, filgrastim, other medicines, foods, dyes, or preservatives  pregnant or trying to get pregnant  breast-feeding How should I use this medicine? This medicine is for injection under the skin. If you get this medicine at home, you will be taught how to prepare and give the pre-filled syringe or how to use the On-body Injector. Refer to the patient Instructions for Use for detailed instructions. Use exactly as directed. Tell your healthcare provider immediately if you suspect that the On-body Injector may not have performed as intended or if you suspect the use of the On-body Injector resulted in a missed or partial dose. It is important that you put your used needles and syringes in a special sharps container. Do not put them in a trash can. If you do not have a sharps container, call your pharmacist or healthcare provider to get one. Talk to your pediatrician regarding the use of this medicine in children. While this drug  may be prescribed for selected conditions, precautions do apply. Overdosage: If you think you have taken too much of this medicine contact a poison control center or emergency room at once. NOTE: This medicine is only for you. Do not share this medicine with others. What if I miss a dose? It is important not to miss your dose. Call your doctor or health care professional if you miss your dose. If you miss a dose due to an On-body Injector failure or leakage, a new dose should be administered as soon as possible using a single prefilled syringe for manual use. What may interact with this medicine? Interactions have not been studied. This list may not describe all possible interactions. Give your health care provider a list of all the medicines, herbs, non-prescription drugs, or dietary supplements you use. Also tell them if you smoke, drink alcohol, or use illegal drugs. Some items may interact with your medicine. What should I watch for while using this medicine? Your condition will be monitored carefully while you are receiving this medicine. You may need blood work done while you are taking this medicine. Talk to your health care provider about your risk of cancer. You may be more at risk for certain types of cancer if you take this medicine. If you are going to need a MRI, CT scan, or other procedure, tell your doctor that you are using this medicine (On-Body Injector only). What side effects may I notice from receiving this medicine? Side effects that you should report to your doctor or health care professional as soon as possible:  allergic reactions (skin rash, itching or hives, swelling of   the face, lips, or tongue)  back pain  dizziness  fever  pain, redness, or irritation at site where injected  pinpoint red spots on the skin  red or dark-brown urine  shortness of breath or breathing problems  stomach or side pain, or pain at the shoulder  swelling  tiredness  trouble  passing urine or change in the amount of urine  unusual bruising or bleeding Side effects that usually do not require medical attention (report to your doctor or health care professional if they continue or are bothersome):  bone pain  muscle pain This list may not describe all possible side effects. Call your doctor for medical advice about side effects. You may report side effects to FDA at 1-800-FDA-1088. Where should I keep my medicine? Keep out of the reach of children. If you are using this medicine at home, you will be instructed on how to store it. Throw away any unused medicine after the expiration date on the label. NOTE: This sheet is a summary. It may not cover all possible information. If you have questions about this medicine, talk to your doctor, pharmacist, or health care provider.  2021 Elsevier/Gold Standard (2019-09-24 13:20:51)  

## 2020-10-18 DIAGNOSIS — F900 Attention-deficit hyperactivity disorder, predominantly inattentive type: Secondary | ICD-10-CM | POA: Diagnosis not present

## 2020-10-18 DIAGNOSIS — F411 Generalized anxiety disorder: Secondary | ICD-10-CM | POA: Diagnosis not present

## 2020-10-18 DIAGNOSIS — C8590 Non-Hodgkin lymphoma, unspecified, unspecified site: Secondary | ICD-10-CM | POA: Diagnosis not present

## 2020-10-18 DIAGNOSIS — Z79899 Other long term (current) drug therapy: Secondary | ICD-10-CM | POA: Diagnosis not present

## 2020-10-18 DIAGNOSIS — R69 Illness, unspecified: Secondary | ICD-10-CM | POA: Diagnosis not present

## 2020-10-26 DIAGNOSIS — F411 Generalized anxiety disorder: Secondary | ICD-10-CM | POA: Diagnosis not present

## 2020-10-26 DIAGNOSIS — R69 Illness, unspecified: Secondary | ICD-10-CM | POA: Diagnosis not present

## 2020-10-26 DIAGNOSIS — C8590 Non-Hodgkin lymphoma, unspecified, unspecified site: Secondary | ICD-10-CM | POA: Diagnosis not present

## 2020-10-26 DIAGNOSIS — Z79899 Other long term (current) drug therapy: Secondary | ICD-10-CM | POA: Diagnosis not present

## 2020-10-26 DIAGNOSIS — F900 Attention-deficit hyperactivity disorder, predominantly inattentive type: Secondary | ICD-10-CM | POA: Diagnosis not present

## 2020-10-31 DIAGNOSIS — R931 Abnormal findings on diagnostic imaging of heart and coronary circulation: Secondary | ICD-10-CM | POA: Diagnosis not present

## 2020-10-31 DIAGNOSIS — Z8249 Family history of ischemic heart disease and other diseases of the circulatory system: Secondary | ICD-10-CM | POA: Diagnosis not present

## 2020-11-01 ENCOUNTER — Encounter: Payer: Self-pay | Admitting: Hematology

## 2020-11-01 LAB — LIPID PANEL
Chol/HDL Ratio: 5.6 ratio — ABNORMAL HIGH (ref 0.0–5.0)
Cholesterol, Total: 229 mg/dL — ABNORMAL HIGH (ref 100–199)
HDL: 41 mg/dL (ref >39)
LDL Chol Calc (NIH): 153 mg/dL — ABNORMAL HIGH (ref 0–99)
Triglycerides: 191 mg/dL — ABNORMAL HIGH (ref 0–149)
VLDL Cholesterol Cal: 35 mg/dL (ref 5–40)

## 2020-11-06 ENCOUNTER — Other Ambulatory Visit: Payer: Self-pay

## 2020-11-06 ENCOUNTER — Inpatient Hospital Stay: Payer: 59 | Attending: Hematology

## 2020-11-06 DIAGNOSIS — C8248 Follicular lymphoma grade IIIb, lymph nodes of multiple sites: Secondary | ICD-10-CM | POA: Insufficient documentation

## 2020-11-06 DIAGNOSIS — Z95828 Presence of other vascular implants and grafts: Secondary | ICD-10-CM

## 2020-11-06 DIAGNOSIS — Z452 Encounter for adjustment and management of vascular access device: Secondary | ICD-10-CM | POA: Diagnosis not present

## 2020-11-06 MED ORDER — SODIUM CHLORIDE 0.9% FLUSH
10.0000 mL | INTRAVENOUS | Status: DC | PRN
  Administered 2020-11-06: 10 mL
  Filled 2020-11-06: qty 10

## 2020-11-06 MED ORDER — HEPARIN SOD (PORK) LOCK FLUSH 100 UNIT/ML IV SOLN
500.0000 [IU] | Freq: Once | INTRAVENOUS | Status: AC | PRN
  Administered 2020-11-06: 500 [IU]
  Filled 2020-11-06: qty 5

## 2020-11-08 DIAGNOSIS — F411 Generalized anxiety disorder: Secondary | ICD-10-CM | POA: Diagnosis not present

## 2020-11-08 DIAGNOSIS — C8590 Non-Hodgkin lymphoma, unspecified, unspecified site: Secondary | ICD-10-CM | POA: Diagnosis not present

## 2020-11-08 DIAGNOSIS — F900 Attention-deficit hyperactivity disorder, predominantly inattentive type: Secondary | ICD-10-CM | POA: Diagnosis not present

## 2020-11-08 DIAGNOSIS — Z79899 Other long term (current) drug therapy: Secondary | ICD-10-CM | POA: Diagnosis not present

## 2020-11-08 DIAGNOSIS — R69 Illness, unspecified: Secondary | ICD-10-CM | POA: Diagnosis not present

## 2020-11-09 DIAGNOSIS — F900 Attention-deficit hyperactivity disorder, predominantly inattentive type: Secondary | ICD-10-CM | POA: Diagnosis not present

## 2020-11-09 DIAGNOSIS — Z79899 Other long term (current) drug therapy: Secondary | ICD-10-CM | POA: Diagnosis not present

## 2020-11-09 DIAGNOSIS — C8590 Non-Hodgkin lymphoma, unspecified, unspecified site: Secondary | ICD-10-CM | POA: Diagnosis not present

## 2020-11-09 DIAGNOSIS — R69 Illness, unspecified: Secondary | ICD-10-CM | POA: Diagnosis not present

## 2020-11-09 DIAGNOSIS — F411 Generalized anxiety disorder: Secondary | ICD-10-CM | POA: Diagnosis not present

## 2020-11-13 ENCOUNTER — Other Ambulatory Visit: Payer: Self-pay

## 2020-11-13 ENCOUNTER — Ambulatory Visit (INDEPENDENT_AMBULATORY_CARE_PROVIDER_SITE_OTHER): Payer: 59 | Admitting: Internal Medicine

## 2020-11-13 ENCOUNTER — Encounter: Payer: Self-pay | Admitting: Internal Medicine

## 2020-11-13 VITALS — BP 125/73 | HR 104 | Ht 73.0 in | Wt 170.2 lb

## 2020-11-13 DIAGNOSIS — I700 Atherosclerosis of aorta: Secondary | ICD-10-CM | POA: Diagnosis not present

## 2020-11-13 DIAGNOSIS — E785 Hyperlipidemia, unspecified: Secondary | ICD-10-CM | POA: Diagnosis not present

## 2020-11-13 DIAGNOSIS — I712 Thoracic aortic aneurysm, without rupture, unspecified: Secondary | ICD-10-CM

## 2020-11-13 DIAGNOSIS — R931 Abnormal findings on diagnostic imaging of heart and coronary circulation: Secondary | ICD-10-CM | POA: Diagnosis not present

## 2020-11-13 DIAGNOSIS — Z8249 Family history of ischemic heart disease and other diseases of the circulatory system: Secondary | ICD-10-CM

## 2020-11-13 NOTE — Patient Instructions (Signed)
Medication Instructions:  Your physician recommends that you continue on your current medications as directed. Please refer to the Current Medication list given to you today.  *If you need a refill on your cardiac medications before your next appointment, please call your pharmacy*   Lab Work: FASTING lipid panel to check cholesterol in 6 months (before next visit)  If you have labs (blood work) drawn today and your tests are completely normal, you will receive your results only by: Marland Kitchen MyChart Message (if you have MyChart) OR . A paper copy in the mail If you have any lab test that is abnormal or we need to change your treatment, we will call you to review the results.   Testing/Procedures: NONE   Follow-Up: At [facility], you and your health needs are our priority.  As part of our continuing mission to provide you with exceptional heart care, we have created designated Provider Care Teams.  These Care Teams include your primary Cardiologist (physician) and Advanced Practice Providers (APPs -  Physician Assistants and Nurse Practitioners) who all work together to provide you with the care you need, when you need it.  We recommend signing up for the patient portal called "MyChart".  Sign up information is provided on this After Visit Summary.  MyChart is used to connect with patients for Virtual Visits (Telemedicine).  Patients are able to view lab/test results, encounter notes, upcoming appointments, etc.  Non-urgent messages can be sent to your provider as well.   To learn more about what you can do with MyChart, go to [email or web address].    Your next appointment:   6 month(s)  The format for your next appointment:   In Person  Provider:   You may see Pixie Casino, MD or one of the following Advanced Practice Providers on your designated Care Team:    Almyra Deforest, PA-C  Fabian Sharp, PA-C or   Roby Lofts, [place]    Other Instructions

## 2020-11-13 NOTE — Progress Notes (Signed)
OFFICE CONSULT NOTE  Chief Complaint:  Follow-up dyslipidemia  Primary Care Physician: George Pretty, MD  HPI:  George Proctor is a 65 y.o. male who is being seen today for the evaluation of the above chief complaints at the request of George Pretty, MD. George Proctor has a history of anxiety and depression as well as memory loss in the past. Recently he's been suffering from shortness of breath, fatigue, mental fogginess, anhedonia and symptoms concerning for depression. He denies any anginal symptoms, per se. His shortness of breath sometimes is associated with exertion and can be present at rest. He says that he's had a mental fogginess and has definitely struggled with depression symptoms. He is currently on nortriptyline for his depression. Otherwise he takes no medications. Is no history of hypertension, diabetes or significant dyslipidemia. Family history is significant for hypertension and heart disease in his father but he still living at age 56.   03/04/2017  George Proctor returns today for follow-up. He continues to report some significant fatigue and depression. He has been referred to a psychiatrist who started him on Adderall, bupropion and Rexulti with diagnosis of depression and ADD. He says now he feels worse than he was when he started the medications. He says he is extremely slow to get moving in the morning after starting on the medications. Today we reviewed his echocardiogram which demonstrates further effacement of the aortic valve measuring 4.6 cm at the sinus of Valsalva with mild to moderate aortic insufficiency. He is also hypotensive today 98/70. He is not on any blood pressure medication and reports she's had some occasional positional dizziness. This could be related to George Proctor, which is a fairly newer medication, but has listed orthostatic hypotension as a side effect. Again, George Proctor had a cardiac MRA in 2015 which showed some worsening of his aortic root dilatation compared to  an earlier study, but no ascending aortic aneurysm. This echocardiogram also demonstrated an ascending aortic measurement of 3.8 cm. He reported that his mother died of a thoracic aneurysm as well.  08/10/2018  George Proctor is seen today in follow-up.  He underwent an echo this summer was seen by George Deforest, PA-C for dyspnea.  This seems to be fairly consistent and does not seem to be worsening.  Is not thought that the AI is a cause for his dyspnea.  His aortic root is stable in size.  In fact he had a CT angiogram earlier this year which showed a somewhat smaller measurement 4.2 cm.  This will need to be followed by echo.  He does report his chest pain has improved and it was felt that this was reflux.  He is currently on a PPI.  He does see psychiatry but is been on and off various medications for depression and fatigue without much benefit.  04/05/2019  George Proctor is referred back for follow-up today.  He recently saw his PCP and had described some chest discomfort.  This was left sternal discomfort which was in a small area and rated as 1 out of 10 in intensity.  It did not radiate.  Subsequently has gone away.  Does not sound cardiac in nature.  He continues to struggle with depression and inactivity.  He says antidepressants including stimulants that were not effective for him.  He is not currently on any medications other than a PPI.  EKG performed in their office showed sinus rhythm with some nonspecific QRS changes.  I repeated an EKG today which  shows sinus tachycardia at 107 without ischemic changes and some possible right atrial enlargement.  He does have a history of an aneurysmal thoracic aorta as well as the descending aortic aneurysmal segment.  He was seen by Dr. early about a year ago who recommended repeat imaging.  A CT has been ordered but not obtained.  05/12/2020  George Proctor is seen today for annual follow-up.  Overall he continues to do well without complaints.  He is undergoing some  physical therapy.  More recently was noted to have somewhat elevated cholesterol with an LDL 139.  He says he eats healthy and is very active.  This could be related also to testosterone injections which he is undergoing and more recently he started on Latuda and his depression.  Some chest pain earlier this spring and was seen by George Deforest, PA-C and underwent nuclear stress testing in May 2021 which is low risk Myoview showed an EF of 61%.  He reports afterwards the chest pain had resolved.  11/13/2020  George Proctor is seen today in follow-up.  He is accompanied by his wife George Proctor.  Unfortunately George Proctor was diagnosed with lymphoma this past year.  He is undergoing chemotherapy.  As part of the work-up he is undergone a number of CT scans which have demonstrated aortic atherosclerosis and calcified and noncalcified plaque in the thoracic aorta.  Specific coronary artery calcification was not commented on.  He did undergo Myoview stress testing in May 2021 which was negative for ischemia.  Echo in October 2021 demonstrated normal LVEF 65 to 50%, grade 1 diastolic dysfunction.  We have persistently followed him for dilated aortic root however none of the studies have suggested that it is dilated.  He had recent labs including a lipid profile showing a total cholesterol of 9, triglycerides 1 are 91, HDL 41 and LDL 153.  He has not previously been on any therapy for this.   PMHx:  Past Medical History:  Diagnosis Date  . Anxiety   . Depression   . Gait difficulty   . Lymphoma ([place])   . Memory loss     Past Surgical History:  Procedure Laterality Date  . ABDOMINAL SURGERY     Childhood  . BUNIONECTOMY    . IR IMAGING GUIDED PORT INSERTION  06/30/2020  . IR PARACENTESIS  06/26/2020  . IR US GUIDE BX ASP/DRAIN  06/26/2020    FAMHx:  Family History  Problem Relation Age of Onset  . Aneurysm Mother 18       Thoracic  . Hypertension Father   . Heart failure Father   . Heart disease Father   .  Heart attack Paternal Grandfather   . Depression Cousin   . Bipolar disorder Cousin   . Schizophrenia Maternal Uncle     SOCHx:   reports that he has never smoked. He has never used smokeless tobacco. He reports that he does not drink alcohol and does not use drugs.  ALLERGIES:  Allergies  Allergen Reactions  . Omeprazole Nausea And Vomiting    ROS: Pertinent items noted in HPI and remainder of comprehensive ROS otherwise negative.  HOME MEDS: Current Outpatient Medications on File Prior to Visit  Medication Sig Dispense Refill  . Ascorbic Acid (VITAMIN C) 1000 MG tablet Take 1,000 mg by mouth daily.    . B Complex Vitamins (VITAMIN B COMPLEX PO) Take 1 tablet by mouth daily.     Marland Kitchen guanFACINE (TENEX) 1 MG tablet TAKE 1/2 TABLET AT  BEDTIME FOR 3 NIGHTS, THEN TAKE 1 TABLET AT BEDTIME    . Multiple Vitamin (MULTIVITAMIN) capsule Take 1 capsule by mouth daily.    . pantoprazole (PROTONIX) 40 MG tablet Take 40 mg by mouth 2 (two) times daily as needed (indigestion).     Marland Kitchen senna-docusate (SENNA S) 8.6-50 MG tablet Take 2 tablets by mouth at bedtime. 60 tablet 1  . zolpidem (AMBIEN CR) 12.5 MG CR tablet TAKE 1 TABLET BY MOUTH AT BEDTIME AS NEEDED FOR SLEEP. 30 tablet 0   No current facility-administered medications on file prior to visit.    LABS/IMAGING: No results found for this or any previous visit (from the past 48 hour(s)). No results found.  LIPID PANEL:    Component Value Date/Time   CHOL 229 (H) 10/31/2020 0818   TRIG 191 (H) 10/31/2020 0818   HDL 41 10/31/2020 0818   CHOLHDL 5.6 (H) 10/31/2020 0818   CHOLHDL 5.2 CALC 11/03/2007 1136   VLDL 24 11/03/2007 1136   LDLCALC 153 (H) 10/31/2020 0818    WEIGHTS: Wt Readings from Last 3 Encounters:  11/13/20 170 lb 3.2 oz (77.2 kg)  10/13/20 171 lb (77.6 kg)  09/22/20 172 lb 6.4 oz (78.2 kg)    VITALS: BP 125/73   Pulse (!) 104   Ht 6\' 1"  (1.854 m)   Wt 170 lb 3.2 oz (77.2 kg)   SpO2 97%   BMI 22.46 kg/m    EXAM: General appearance: alert and no distress Neck: no carotid bruit, no JVD and thyroid not enlarged, symmetric, no tenderness/mass/nodules Lungs: clear to auscultation bilaterally Heart: regular rate and rhythm and diastolic murmur: early diastolic 2/6, blowing at apex Abdomen: soft, non-tender; bowel sounds normal; no masses,  no organomegaly Extremities: extremities normal, atraumatic, no cyanosis or edema Pulses: 2+ and symmetric Skin: Skin color, texture, turgor normal. No rashes or lesions Neurologic: Grossly normal Psych: Pleasant  EKG: Sinus tachycardia at 104-personally reviewed  ASSESSMENT: 1. High grade follicular lymphoma 2. Atypical chest pain-low risk Myoview 01/2020, normal LV function 3. Aortic root dilatation-measuring 4.1-4.6 cm (01/2017) 4. 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 5. Mild to moderate aortic insufficiency 6. Shortness of breath, fatigue 7. Probable depression 8. Orthostatic hypotension  PLAN: 1.   Mr. Schara unfortunately was recently diagnosed with lymphoma however had a significant response to chemotherapy.  He lost about 25 pounds but has gained some weight back.  Despite this his cholesterol is still quite high.  He has been sedentary.  His imaging indicated aortic atherosclerosis and calcification as well as a finding of chronic, nonfocal flow-limiting dissection of the SMA compared to a 2019 film which was stable and the right common iliac artery is aneurysmal.  He would benefit him anyways from statin therapy which we discussed today however he is hesitant to take medications.  His wife also felt that he needs to work in diet and more physical activity.  I agree with that and will provide him with some recommendations however I still think that he would benefit from therapy.  We will plan 6 months of  lifestyle modifications and reassess his cholesterol at that time.  Pixie Casino, MD, [facility], [place] Director of the [facility] Diplomate of the American Board of Clinical Lipidology Attending Cardiologist  Direct Dial: [phone]  Fax: [phone]  Website:  [email or web address] 11/13/2020, 8:07 AM

## 2020-11-22 DIAGNOSIS — R69 Illness, unspecified: Secondary | ICD-10-CM | POA: Diagnosis not present

## 2020-11-22 DIAGNOSIS — F339 Major depressive disorder, recurrent, unspecified: Secondary | ICD-10-CM | POA: Diagnosis not present

## 2020-11-28 DIAGNOSIS — R69 Illness, unspecified: Secondary | ICD-10-CM | POA: Diagnosis not present

## 2020-12-06 DIAGNOSIS — C8590 Non-Hodgkin lymphoma, unspecified, unspecified site: Secondary | ICD-10-CM | POA: Diagnosis not present

## 2020-12-06 DIAGNOSIS — R69 Illness, unspecified: Secondary | ICD-10-CM | POA: Diagnosis not present

## 2020-12-06 DIAGNOSIS — Z79899 Other long term (current) drug therapy: Secondary | ICD-10-CM | POA: Diagnosis not present

## 2020-12-06 DIAGNOSIS — F900 Attention-deficit hyperactivity disorder, predominantly inattentive type: Secondary | ICD-10-CM | POA: Diagnosis not present

## 2020-12-06 DIAGNOSIS — F411 Generalized anxiety disorder: Secondary | ICD-10-CM | POA: Diagnosis not present

## 2020-12-19 DIAGNOSIS — F339 Major depressive disorder, recurrent, unspecified: Secondary | ICD-10-CM | POA: Diagnosis not present

## 2020-12-19 DIAGNOSIS — Z8669 Personal history of other diseases of the nervous system and sense organs: Secondary | ICD-10-CM | POA: Diagnosis not present

## 2020-12-19 DIAGNOSIS — R5383 Other fatigue: Secondary | ICD-10-CM | POA: Diagnosis not present

## 2020-12-19 DIAGNOSIS — R69 Illness, unspecified: Secondary | ICD-10-CM | POA: Diagnosis not present

## 2020-12-20 DIAGNOSIS — R5383 Other fatigue: Secondary | ICD-10-CM | POA: Diagnosis not present

## 2021-01-05 ENCOUNTER — Other Ambulatory Visit: Payer: Self-pay

## 2021-01-05 ENCOUNTER — Inpatient Hospital Stay: Payer: 59 | Attending: Hematology

## 2021-01-05 ENCOUNTER — Inpatient Hospital Stay: Payer: 59

## 2021-01-05 DIAGNOSIS — Z95828 Presence of other vascular implants and grafts: Secondary | ICD-10-CM

## 2021-01-05 DIAGNOSIS — Z452 Encounter for adjustment and management of vascular access device: Secondary | ICD-10-CM | POA: Insufficient documentation

## 2021-01-05 DIAGNOSIS — C8248 Follicular lymphoma grade IIIb, lymph nodes of multiple sites: Secondary | ICD-10-CM

## 2021-01-05 DIAGNOSIS — Z5111 Encounter for antineoplastic chemotherapy: Secondary | ICD-10-CM

## 2021-01-05 LAB — CMP (CANCER CENTER ONLY)
ALT: 25 U/L (ref 0–44)
AST: 22 U/L (ref 15–41)
Albumin: 4.3 g/dL (ref 3.5–5.0)
Alkaline Phosphatase: 75 U/L (ref 38–126)
Anion gap: 8 (ref 5–15)
BUN: 15 mg/dL (ref 8–23)
CO2: 27 mmol/L (ref 22–32)
Calcium: 9.2 mg/dL (ref 8.9–10.3)
Chloride: 106 mmol/L (ref 98–111)
Creatinine: 0.81 mg/dL (ref 0.61–1.24)
GFR, Estimated: >60 mL/min (ref >60)
Glucose, Bld: 84 mg/dL (ref 70–99)
Potassium: 4.2 mmol/L (ref 3.5–5.1)
Sodium: 141 mmol/L (ref 135–145)
Total Bilirubin: 0.4 mg/dL (ref 0.3–1.2)
Total Protein: 6.2 g/dL — ABNORMAL LOW (ref 6.5–8.1)

## 2021-01-05 LAB — CBC WITH DIFFERENTIAL/PLATELET
Abs Immature Granulocytes: 0.03 10*3/uL (ref 0.00–0.07)
Basophils Absolute: 0 10*3/uL (ref 0.0–0.1)
Basophils Relative: 1 %
Eosinophils Absolute: 0.1 10*3/uL (ref 0.0–0.5)
Eosinophils Relative: 3 %
HCT: 41.8 % (ref 39.0–52.0)
Hemoglobin: 13.8 g/dL (ref 13.0–17.0)
Immature Granulocytes: 1 %
Lymphocytes Relative: 23 %
Lymphs Abs: 0.7 10*3/uL (ref 0.7–4.0)
MCH: 29.7 pg (ref 26.0–34.0)
MCHC: 33 g/dL (ref 30.0–36.0)
MCV: 89.9 fL (ref 80.0–100.0)
Monocytes Absolute: 0.5 10*3/uL (ref 0.1–1.0)
Monocytes Relative: 16 %
Neutro Abs: 1.6 10*3/uL — ABNORMAL LOW (ref 1.7–7.7)
Neutrophils Relative %: 56 %
Platelets: 164 10*3/uL (ref 150–400)
RBC: 4.65 MIL/uL (ref 4.22–5.81)
RDW: 13.2 % (ref 11.5–15.5)
WBC: 2.9 10*3/uL — ABNORMAL LOW (ref 4.0–10.5)
nRBC: 0 % (ref 0.0–0.2)

## 2021-01-05 LAB — LACTATE DEHYDROGENASE: LDH: 231 U/L — ABNORMAL HIGH (ref 98–192)

## 2021-01-05 MED ORDER — HEPARIN SOD (PORK) LOCK FLUSH 100 UNIT/ML IV SOLN
500.0000 [IU] | Freq: Once | INTRAVENOUS | Status: AC | PRN
  Administered 2021-01-05: 500 [IU]
  Filled 2021-01-05: qty 5

## 2021-01-05 MED ORDER — SODIUM CHLORIDE 0.9% FLUSH
10.0000 mL | INTRAVENOUS | Status: DC | PRN
  Administered 2021-01-05: 10 mL
  Filled 2021-01-05: qty 10

## 2021-01-05 NOTE — Patient Instructions (Signed)

## 2021-01-09 DIAGNOSIS — R41840 Attention and concentration deficit: Secondary | ICD-10-CM | POA: Diagnosis not present

## 2021-01-15 DIAGNOSIS — Z1211 Encounter for screening for malignant neoplasm of colon: Secondary | ICD-10-CM | POA: Diagnosis not present

## 2021-01-15 DIAGNOSIS — Z1212 Encounter for screening for malignant neoplasm of rectum: Secondary | ICD-10-CM | POA: Diagnosis not present

## 2021-01-16 ENCOUNTER — Encounter (HOSPITAL_COMMUNITY): Payer: Self-pay

## 2021-01-16 ENCOUNTER — Other Ambulatory Visit: Payer: Self-pay

## 2021-01-16 ENCOUNTER — Ambulatory Visit (HOSPITAL_COMMUNITY)
Admission: RE | Admit: 2021-01-16 | Discharge: 2021-01-16 | Disposition: A | Discharge status: Home / Self Care | Payer: 59 | Source: Ambulatory Visit | Attending: Hematology | Admitting: Hematology

## 2021-01-16 DIAGNOSIS — I700 Atherosclerosis of aorta: Secondary | ICD-10-CM | POA: Diagnosis not present

## 2021-01-16 DIAGNOSIS — C8248 Follicular lymphoma grade IIIb, lymph nodes of multiple sites: Secondary | ICD-10-CM | POA: Diagnosis not present

## 2021-01-16 DIAGNOSIS — K429 Umbilical hernia without obstruction or gangrene: Secondary | ICD-10-CM | POA: Diagnosis not present

## 2021-01-16 DIAGNOSIS — Z5111 Encounter for antineoplastic chemotherapy: Secondary | ICD-10-CM | POA: Diagnosis not present

## 2021-01-16 DIAGNOSIS — Z95828 Presence of other vascular implants and grafts: Secondary | ICD-10-CM | POA: Diagnosis not present

## 2021-01-16 DIAGNOSIS — I2510 Atherosclerotic heart disease of native coronary artery without angina pectoris: Secondary | ICD-10-CM | POA: Diagnosis not present

## 2021-01-16 DIAGNOSIS — C8290 Follicular lymphoma, unspecified, unspecified site: Secondary | ICD-10-CM | POA: Diagnosis not present

## 2021-01-16 DIAGNOSIS — K5750 Diverticulosis of both small and large intestine without perforation or abscess without bleeding: Secondary | ICD-10-CM | POA: Diagnosis not present

## 2021-01-16 MED ORDER — HEPARIN SOD (PORK) LOCK FLUSH 100 UNIT/ML IV SOLN
INTRAVENOUS | Status: AC
  Filled 2021-01-16: qty 5

## 2021-01-16 MED ORDER — SODIUM CHLORIDE (PF) 0.9 % IJ SOLN
INTRAMUSCULAR | Status: AC
  Filled 2021-01-16: qty 50

## 2021-01-16 MED ORDER — IOHEXOL 300 MG/ML  SOLN
100.0000 mL | Freq: Once | INTRAMUSCULAR | Status: AC | PRN
  Administered 2021-01-16: 100 mL via INTRAVENOUS

## 2021-01-16 MED ORDER — HEPARIN SOD (PORK) LOCK FLUSH 100 UNIT/ML IV SOLN
500.0000 [IU] | Freq: Once | INTRAVENOUS | Status: AC
  Administered 2021-01-16: 500 [IU] via INTRAVENOUS

## 2021-01-17 DIAGNOSIS — R69 Illness, unspecified: Secondary | ICD-10-CM | POA: Diagnosis not present

## 2021-01-17 DIAGNOSIS — Z79899 Other long term (current) drug therapy: Secondary | ICD-10-CM | POA: Diagnosis not present

## 2021-01-17 DIAGNOSIS — C8590 Non-Hodgkin lymphoma, unspecified, unspecified site: Secondary | ICD-10-CM | POA: Diagnosis not present

## 2021-01-17 DIAGNOSIS — F411 Generalized anxiety disorder: Secondary | ICD-10-CM | POA: Diagnosis not present

## 2021-01-17 NOTE — Progress Notes (Signed)
HEMATOLOGY/ONCOLOGY CLINIC NOTE  Date of Service: 01/18/2021  Patient Care Team: [name], MD as PCP - General (Internal Medicine) Debara Pickett Nadean Corwin, MD as PCP - Cardiology (Cardiology) [name], MD (Internal Medicine)  CHIEF COMPLAINTS/PURPOSE OF CONSULTATION:  Mx of high grade follicular lymphoma  HISTORY OF PRESENTING ILLNESS:   George Proctor is a wonderful 65 y.o. male who has been referred to [place] by Dr. Shelia Media for evaluation and management of possible wide-spread lymphoma - palpable mass in abdomen. Pt is accompanied today by his wife. The pt reports that he is doing well overall.   The pt reports that about three weeks ago he began experiencing abdominal fullness. Two weeks ago he began to feel abdominal pain. Lying down makes this discomfort worse. He has not had a restful sleep in two weeks. The abdominal fullness has caused lack of appetite, belching, and one episode of nausea and vomiting. He is currently eating 1/3-1/2 of his baseline. Pt has been taking Tramadol to control his pain, as well as a stool softener. His bowel movements look different, but he denies any constipation. He is hot natured and has mild night sweats, but denies drenching night sweats. Pt feels that he has been short of breath for awhile, but that this may have worsened lately. Pt follows with Dr. Benson Norway, GI.   He had abdominal surgery as a child after an accident. Pt has had GI bleeding and ulcers from taking NSAIDS. Pt has had several instances of abdominal swelling that were triggered by illness. Pt had esophageal dialation. His strictures were thought to be caused by acid reflux. He also has an umbilical hernia. He has chronic back pain. Pt was diagnosed with chronic lyme disease by a [facility]. He has a history of balance issues and has brought this up to his PCP, who did not feel that it required a work up. He does not have a true allergy, but experiences nausea and vomiting when taking  Omeprazole. Pt denies any international travel in the last year.   Pt has been taking weekly Testosterone for 3-4 months. He was experiencing fatigue prior to beginning the hormonal injections. Pt was placed on Latuda & Lamictil several weeks ago for his Depression and Anxiety, but discontinued a week ago as he did not see any improvement in mood. He is not currently following with a mental health professional.   Dr. Shelia Media referred pt to Dr. Redmond Pulling at [facility] for biopsy consideration. Pt is scheduled to see Dr. Shelia Media next Wednesday.   Of note prior to the patient's visit today, pt has had  CT Abd/Pel completed on 06/05/2020 with results revealing numerous cardiophrenic, retroperitoneal and mesenteric adenopathy most consistent with lymphoma. Small volume of ascites. Hepatosplenomegaly.  Most recent lab results (03/22/2020) of CBC is as follows: all values are WNL except for WBC at 3.5K, PLT at 131K, Lymphs Rel at 17.6, Total Protein at 6.3.  On review of systems, pt reports worsening SOB, abdominal pain, abdominal fullness, low appetite, dyspepsia, mild night sweats and denies fevers, chills, rash, headaches, leg swelling, urinary habit changes, constipation, testicular pain/swelling and any other symptoms.   On PMHx the pt reports Depression, Anxiety, Abdominal Surgery, Gait difficulty, Esophageal dilation. On Family Hx the pt reports that his father and paternal aunt had polymyalgia rheumatica. His paternal aunt also had breast cancer.  INTERVAL HISTORY:  George Proctor is a wonderful 65 y.o. male who is here for evaluation and management of lymphoma.  The patient's last visit with [place] was on 10/13/2020. Pt is accompanied today by his wife.The pt reports that he is doing well overall.  The pt reports that he has slowly been improving and returning to his baseline. The pt notes some mild lingering fatigue and soreness. He notes that he does not desire to do much at the current  time, but would if he had more energy. The pt notes that his insurance will be changing soon and he is wondering if he will still be covered. The pt notes that he is still struggling with depression lately. The pt notes he has tried many antidepressants with little to no results. The pt notes that he will be trying something new soon. The pt notes that he is currently concerned with the calcification in his heart. The pt notes he was concerned with the statin drug his Cardiologist wanted to put him on. The pt has been actively trying to change his diet as well. He notes he had a stress test years ago, but there were no major findings that needed action. The pt notes a discomfort in his chest that concerns him.    Of note since the patient's last visit, pt has had CT C/A/P on 01/17/2021, which revealed "1. Interval decrease in size in very ill-defined retroperitoneal lymph nodes and associated soft tissue stranding. There is decreased soft tissue within the small bowel mesentery as well as about the bilateral common iliac, left internal iliac, and left pelvic sidewall stations, decreased compared to prior. Findings are consistent with treatment response of lymphoma. 2. No evidence of mass or lymphadenopathy within the chest. 3. Coronary artery disease."  Lab results today 01/05/2021 of CBC w/diff and CMP is as follows: all values are WNL except for WBC of 2.9K, Neutro Abs of 1.6K, Total Protein of 6.2. 01/05/2021 LDH of 231.  On review of systems, pt reports continued fatigue, soreness, depression, anxiety and denies new lumps/bumps, SOB, abdominal pain, leg swelling, and any other symptoms.  MEDICAL HISTORY:  Past Medical History:  Diagnosis Date  . Anxiety   . Depression   . Gait difficulty   . Lymphoma ([place]) dx'd 05/2020  . Memory loss    SURGICAL HISTORY: Past Surgical History:  Procedure Laterality Date  . ABDOMINAL SURGERY     Childhood  . BUNIONECTOMY    . IR IMAGING GUIDED PORT  INSERTION  06/30/2020  . IR PARACENTESIS  06/26/2020  . IR US GUIDE BX ASP/DRAIN  06/26/2020    SOCIAL HISTORY: Social History   Socioeconomic History  . Marital status: Married    Spouse name: Not on file  . Number of children: 0  . Years of education: Veterinary surgeon  . Highest education level: Not on file  Occupational History  . Occupation: Scientist, research (physical sciences)  Tobacco Use  . Smoking status: Never Smoker  . Smokeless tobacco: Never Used  Vaping Use  . Vaping Use: Never used  Substance and Sexual Activity  . Alcohol use: No    Alcohol/week: 0.0 standard drinks    Comment: Less than one drink per week.  . Drug use: No  . Sexual activity: Not Currently  Other Topics Concern  . Not on file  Social History Narrative   Lives at home with wife.   Right-handed.   3 cups caffeine per day.   Social Determinants of Health   Financial Resource Strain: Not on file  Food Insecurity: Not on file  Transportation Needs: Not on file  Physical Activity: Not on file  Stress: Not on file  Social Connections: Not on file  Intimate Partner Violence: Not on file    FAMILY HISTORY: Family History  Problem Relation Age of Onset  . Aneurysm Mother 43       Thoracic  . Hypertension Father   . Heart failure Father   . Heart disease Father   . Heart attack Paternal Grandfather   . Depression Cousin   . Bipolar disorder Cousin   . Schizophrenia Maternal Uncle     ALLERGIES:  is allergic to omeprazole.  MEDICATIONS:  Current Outpatient Medications  Medication Sig Dispense Refill  . Ascorbic Acid (VITAMIN C) 1000 MG tablet Take 1,000 mg by mouth daily.    . B Complex Vitamins (VITAMIN B COMPLEX PO) Take 1 tablet by mouth daily.     . Multiple Vitamin (MULTIVITAMIN) capsule Take 1 capsule by mouth daily.    . pantoprazole (PROTONIX) 40 MG tablet Take 40 mg by mouth 2 (two) times daily as needed (indigestion).     . Vitamin D, Ergocalciferol, (DRISDOL) 1.25 MG (50000  UNIT) CAPS capsule Take 5,000 Units by mouth every 7 (seven) days.    Marland Kitchen zolpidem (AMBIEN CR) 12.5 MG CR tablet TAKE 1 TABLET BY MOUTH AT BEDTIME AS NEEDED FOR SLEEP. 30 tablet 0  . guanFACINE (TENEX) 1 MG tablet TAKE 1/2 TABLET AT BEDTIME FOR 3 NIGHTS, THEN TAKE 1 TABLET AT BEDTIME (Patient not taking: Reported on 01/18/2021)    . senna-docusate (SENNA S) 8.6-50 MG tablet Take 2 tablets by mouth at bedtime. (Patient not taking: Reported on 01/18/2021) 60 tablet 1   No current facility-administered medications for this visit.    REVIEW OF SYSTEMS:   10 Point review of Systems was done is negative except as noted above.  PHYSICAL EXAMINATION: ECOG PERFORMANCE STATUS: 2 - Symptomatic, <50% confined to bed  . Vitals:   01/18/21 1406  BP: 112/83  Pulse: (!) 101  Resp: 16  Temp: 97.7 F (36.5 C)  SpO2: 100%   Filed Weights   01/18/21 1406  Weight: 172 lb 8 oz (78.2 kg)   .Body mass index is 22.76 kg/m.   GENERAL:alert, in no acute distress and comfortable SKIN: no acute rashes, no significant lesions EYES: conjunctiva are pink and non-injected, sclera anicteric OROPHARYNX: MMM, no exudates, no oropharyngeal erythema or ulceration NECK: supple, no JVD LYMPH:  no palpable lymphadenopathy in the cervical, axillary or inguinal regions LUNGS: clear to auscultation b/l with normal respiratory effort HEART: regular rate & rhythm ABDOMEN:  normoactive bowel sounds , non tender, not distended. Extremity: no pedal edema PSYCH: alert & oriented x 3 with fluent speech NEURO: no focal motor/sensory deficits  LABORATORY DATA:  I have reviewed the data as listed  . CBC Latest Ref Rng & Units 01/05/2021 10/13/2020 09/22/2020  WBC 4.0 - 10.5 K/uL 2.9(L) 6.7 5.2  Hemoglobin 13.0 - 17.0 g/dL 13.8 12.2(L) 11.3(L)  Hematocrit 39.0 - 52.0 % 41.8 38.3(L) 35.1(L)  Platelets 150 - 400 K/uL 164 209 255    . CMP Latest Ref Rng & Units 01/05/2021 10/13/2020 09/22/2020  Glucose 70 - 99 mg/dL 84 109(H) 99   BUN 8 - 23 mg/dL 15 13 12   Creatinine 0.61 - 1.24 mg/dL 0.81 0.83 0.77  Sodium 135 - 145 mmol/L 141 140 141  Potassium 3.5 - 5.1 mmol/L 4.2 4.0 4.0  Chloride 98 - 111 mmol/L 106 108 107  CO2 22 - 32 mmol/L 27 26 27  Calcium 8.9 - 10.3 mg/dL 9.2 8.9 9.1  Total Protein 6.5 - 8.1 g/dL 6.2(L) 6.0(L) 6.0(L)  Total Bilirubin 0.3 - 1.2 mg/dL 0.4 0.4 0.5  Alkaline Phos 38 - 126 U/L 75 71 77  AST 15 - 41 U/L 22 20 22   ALT 0 - 44 U/L 25 26 24    . Lab Results  Component Value Date   LDH 231 (H) 01/05/2021    RADIOGRAPHIC STUDIES: I have personally reviewed the radiological images as listed and agreed with the findings in the report. CT CHEST ABDOMEN PELVIS W CONTRAST  Result Date: 01/17/2021 CLINICAL DATA:  Follicular lymphoma, assess treatment response following chemo immunotherapy, chemotherapy complete 09/2020 EXAM: CT CHEST, ABDOMEN, AND PELVIS WITH CONTRAST TECHNIQUE: Multidetector CT imaging of the chest, abdomen and pelvis was performed following the standard protocol during bolus administration of intravenous contrast. CONTRAST:  167mL OMNIPAQUE IOHEXOL 300 MG/ML SOLN, additional oral enteric contrast COMPARISON:  09/13/2020 FINDINGS: CT CHEST FINDINGS Cardiovascular: Right chest port catheter. Normal heart size. Left coronary artery calcifications. No pericardial effusion. Mediastinum/Nodes: No enlarged mediastinal, hilar, or axillary lymph nodes. Thyroid gland, trachea, and esophagus demonstrate no significant findings. Lungs/Pleura: Lungs are clear. No pleural effusion or pneumothorax. Musculoskeletal: No chest wall mass or suspicious bone lesions identified. CT ABDOMEN PELVIS FINDINGS Hepatobiliary: No solid liver abnormality is seen. No gallstones, gallbladder wall thickening, or biliary dilatation. Pancreas: Unremarkable. No pancreatic ductal dilatation or surrounding inflammatory changes. Spleen: Normal in size without significant abnormality. Adrenals/Urinary Tract: Adrenal glands are  unremarkable. Kidneys are normal, without renal calculi, solid lesion, or hydronephrosis. Bladder is unremarkable. Stomach/Bowel: Stomach is within normal limits. Appendix appears normal. No evidence of bowel wall thickening, distention, or inflammatory changes. Sigmoid diverticulosis. Vascular/Lymphatic: Aortic atherosclerosis. Interval decrease in size in very ill-defined retroperitoneal lymph nodes and associated soft tissue stranding, index left retroperitoneal node measuring 1.0 x 0.7 cm, previously 1.5 x 1.1 cm when measured similarly (series 2, image 84). There is decreased soft tissue about the bilateral common iliac stations, left internal iliac, and pelvic sidewall stations, decreased compared to prior. Interval decrease in ill-defined soft tissue stranding in the central small bowel mesentery (series 2, image 89). Reproductive: No mass or other abnormality. Other: Fat containing umbilical hernia.  No abdominopelvic ascites. Musculoskeletal: No acute or significant osseous findings. IMPRESSION: 1. Interval decrease in size in very ill-defined retroperitoneal lymph nodes and associated soft tissue stranding. There is decreased soft tissue within the small bowel mesentery as well as about the bilateral common iliac, left internal iliac, and left pelvic sidewall stations, decreased compared to prior. Findings are consistent with treatment response of lymphoma. 2. No evidence of mass or lymphadenopathy within the chest. 3. Coronary artery disease. Aortic Atherosclerosis (ICD10-I70.0). Electronically Signed   By: Eddie Candle M.D.   On: 01/17/2021 09:15       ASSESSMENT & PLAN:   65 yo with   1) Recently diagnosed advanced Stage III/IV high grade follicular lymphoma S/p 6 cycles of R-CHOP  PLAN: -Discussed pt recent labwork, 01/05/2021; LDH stable elevated , WBC borderline low, other counts and chemistries normal. Anemia resolved. -Discussed pt CT C/A/P on 01/17/2021; in remission with nothing new  or concerning. Almost all lymph nodes resolved. -Advised pt that his anemia has resolved and should not be causing any fatigue at this time. -Discussed chances for recurrence-- Advised pt he had high grade FL and this is fast growing with a lesser population that remains after chemo (unlike low grade FL). Unless there was  a low-grade component, the high grade only is typically cured if no progression within first two years. After two years, the risk is less than 5%. -Advised pt that maintenance Rituxan is only used for low-grade FL and not high-grade. -Recommended pt receive the second COVID booster shot as recently approved. Advised pt to wait 4-6 months following first booster shot before getting this. -Discussed [place] and pt's eligibility. Will send referral. Advised pt to wait around one month after this before getting the second booster shot. -Advised pt we can remove Port at this time if desired. -Recommend pt eat a healthy diet, walk 20-30 min daily, and drink 48-64 oz. -Will see back in 3 months with labs.   FOLLOW UP: RTC with Dr [name] with portflush and labs in 3 months   The total time spent in the appointment was 30 minutes and more than 50% was on counseling and direct patient cares.   All of the patient's questions were answered with apparent satisfaction. The patient knows to call the clinic with any problems, questions or concerns.    Sullivan Lone MD [place] AAHIVMS [facility] Hematology/Oncology Physician [facility]  (Office):       [phone] (Work cell):  [phone] (Fax):           [phone]  01/18/2021 3:19 PM  I, Reinaldo Raddle, am acting as scribe for Dr. Sullivan Lone, MD.     I have reviewed the above documentation for accuracy and completeness, and I agree with the above. Brunetta Genera MD

## 2021-01-18 ENCOUNTER — Inpatient Hospital Stay: Payer: 59 | Attending: Hematology | Admitting: Hematology

## 2021-01-18 ENCOUNTER — Other Ambulatory Visit: Payer: Self-pay

## 2021-01-18 VITALS — BP 112/83 | HR 101 | Temp 97.7°F | Resp 16 | Ht 73.0 in | Wt 172.5 lb

## 2021-01-18 DIAGNOSIS — Z298 Encounter for other specified prophylactic measures: Secondary | ICD-10-CM | POA: Insufficient documentation

## 2021-01-18 DIAGNOSIS — Z9221 Personal history of antineoplastic chemotherapy: Secondary | ICD-10-CM | POA: Insufficient documentation

## 2021-01-18 DIAGNOSIS — C8248 Follicular lymphoma grade IIIb, lymph nodes of multiple sites: Secondary | ICD-10-CM | POA: Insufficient documentation

## 2021-01-18 DIAGNOSIS — R7402 Elevation of levels of lactic acid dehydrogenase (LDH): Secondary | ICD-10-CM | POA: Insufficient documentation

## 2021-01-18 DIAGNOSIS — Z5111 Encounter for antineoplastic chemotherapy: Secondary | ICD-10-CM

## 2021-01-19 ENCOUNTER — Telehealth: Payer: Self-pay | Admitting: Hematology

## 2021-01-19 NOTE — Telephone Encounter (Signed)
Scheduled follow-up appointment per 5/5 los. Patient is aware. ?

## 2021-01-23 LAB — COLOGUARD: COLOGUARD: NEGATIVE

## 2021-01-29 ENCOUNTER — Telehealth: Payer: Self-pay | Admitting: Hematology

## 2021-01-29 ENCOUNTER — Other Ambulatory Visit: Payer: Self-pay | Admitting: Physician Assistant

## 2021-01-29 DIAGNOSIS — C8598 Non-Hodgkin lymphoma, unspecified, lymph nodes of multiple sites: Secondary | ICD-10-CM

## 2021-01-29 NOTE — Telephone Encounter (Signed)
Scheduled appt per 5/16 sch msg. Pt aware.  

## 2021-01-29 NOTE — Progress Notes (Signed)
I connected by phone with George Proctor on 01/29/2021, 8:16 AM to discuss the potential use of a new treatment, tixagevimab/cilgavimab, for pre-exposure prophylaxis for prevention of coronavirus disease 2019 (COVID-19) caused by the SARS-CoV-2 virus.  This patient is a 65 y.o. male that meets the FDA criteria for Emergency Use Authorization of tixagevimab/cilgavimab for pre-exposure prophylaxis of COVID-19 disease. Pt meets following criteria:  Age >12 yr and weight > 40kg  Not currently infected with SARS-CoV-2 and has no known recent exposure to an individual infected with SARS-CoV-2 AND o Who has moderate to severe immune compromise due to a medical condition or receipt of immunosuppressive medications or treatments and may not mount an adequate immune response to COVID-19 vaccination or  o Vaccination with any available COVID-19 vaccine, according to the approved or authorized schedule, is not recommended due to a history of severe adverse reaction (e.g., severe allergic reaction) to a COVID-19 vaccine(s) and/or COVID-19 vaccine component(s).  o Patient meets the following definition of mod-severe immune compromised status: 1. Received B-cell depleting therapies (e.g. rituximab, obinutuzumab, ocrelizumab, alemtuzumab) within last 6 months & age > or = 72 and 6. Other actively treated hematologic malignancies or severe congenital immunodeficiency syndromes  I have spoken and communicated the following to the patient or parent/caregiver regarding COVID monoclonal antibody treatment:  1. FDA has authorized the emergency use of tixagevimab/cilgavimab for the pre-exposure prophylaxis of COVID-19 in patients with moderate-severe immunocompromised status, who meet above EUA criteria.  2. The significant known and potential risks and benefits of COVID monoclonal antibody, and the extent to which such potential risks and benefits are unknown.  3. Information on available alternative treatments and the  risks and benefits of those alternatives, including clinical trials.  4. The patient or parent/caregiver has the option to accept or refuse COVID monoclonal antibody treatment.  After reviewing this information with the patient, agree to receive tixagevimab/cilgavimab  Angelena Form, PA-C, 01/29/2021, 8:16 AM

## 2021-02-01 ENCOUNTER — Other Ambulatory Visit: Payer: Self-pay

## 2021-02-01 ENCOUNTER — Ambulatory Visit: Payer: 59 | Admitting: Neurology

## 2021-02-01 ENCOUNTER — Encounter: Payer: Self-pay | Admitting: Neurology

## 2021-02-01 ENCOUNTER — Inpatient Hospital Stay: Payer: 59

## 2021-02-01 VITALS — BP 109/68 | HR 99 | Ht 73.0 in | Wt 171.3 lb

## 2021-02-01 DIAGNOSIS — R634 Abnormal weight loss: Secondary | ICD-10-CM | POA: Diagnosis not present

## 2021-02-01 DIAGNOSIS — G4700 Insomnia, unspecified: Secondary | ICD-10-CM | POA: Diagnosis not present

## 2021-02-01 DIAGNOSIS — Z298 Encounter for other specified prophylactic measures: Secondary | ICD-10-CM | POA: Diagnosis not present

## 2021-02-01 DIAGNOSIS — G4719 Other hypersomnia: Secondary | ICD-10-CM | POA: Diagnosis not present

## 2021-02-01 DIAGNOSIS — G4733 Obstructive sleep apnea (adult) (pediatric): Secondary | ICD-10-CM

## 2021-02-01 DIAGNOSIS — Z9221 Personal history of antineoplastic chemotherapy: Secondary | ICD-10-CM | POA: Diagnosis not present

## 2021-02-01 DIAGNOSIS — C8598 Non-Hodgkin lymphoma, unspecified, lymph nodes of multiple sites: Secondary | ICD-10-CM

## 2021-02-01 DIAGNOSIS — R7402 Elevation of levels of lactic acid dehydrogenase (LDH): Secondary | ICD-10-CM | POA: Diagnosis not present

## 2021-02-01 DIAGNOSIS — C8248 Follicular lymphoma grade IIIb, lymph nodes of multiple sites: Secondary | ICD-10-CM | POA: Diagnosis not present

## 2021-02-01 MED ORDER — EPINEPHRINE 0.3 MG/0.3ML IJ SOAJ: 0.3000 mg | Freq: Once | INTRAMUSCULAR | Status: DC | PRN

## 2021-02-01 MED ORDER — TIXAGEVIMAB (PART OF EVUSHELD) INJECTION
300.0000 mg | Freq: Once | INTRAMUSCULAR | Status: AC
  Administered 2021-02-01: 300 mg via INTRAMUSCULAR
  Filled 2021-02-01: qty 3

## 2021-02-01 MED ORDER — CILGAVIMAB (PART OF EVUSHELD) INJECTION
300.0000 mg | Freq: Once | INTRAMUSCULAR | Status: AC
  Administered 2021-02-01: 300 mg via INTRAMUSCULAR
  Filled 2021-02-01: qty 3

## 2021-02-01 NOTE — Patient Instructions (Signed)

## 2021-02-01 NOTE — Progress Notes (Signed)
Subjective:    Patient ID: George Proctor is a 65 y.o. male.  HPI     George Age, MD, PhD [facility] [address], [address] [place], [place] [address]  Dear Dr. Shelia Media,   I saw your patient, George Proctor, on your kind request in the sleep clinic today for initial consultation of his sleep disorder, in particular, concern for underlying obstructive sleep apnea.  The patient is unaccompanied today. As you know, George Proctor is a 65 year old right-handed gentleman with an underlying medical history of lymphoma, aortic root dilatation, atypical chest pain, sinus tachycardia, anxiety, and depression, who reports chronic difficulty with sleep initiation and sleep maintenance for years, also daytime somnolence.  He has tried prescription medicines including mirtazapine, which caused oversedation and Belsomra which caused similar side effects.  He feels tired during the day.  He is followed by psychiatry.  He is currently on alprazolam and zolpidem.  He has had chemotherapy for his lymphoma and is followed by oncology.  He has a prior history of sleep apnea but is currently not on CPAP.  I reviewed your office note from 12/19/2020.  Additional medication trials included clonazepam, guanfacine, trazodone, and he has tried multiple antidepressants including sertraline, citalopram, escitalopram, duloxetine, aripiprazole, and bupropion. His Epworth sleepiness score is 14 out of 24, fatigue severity score is 57 out of 63.  He had sleep study testing through [facility] in January 2004, a baseline sleep study from 10/05/2002 showed a RDI of 3.4/h, average oxygen saturation was 93%, nadir was not listed.  He had a next-day MSLT on 10/06/2002 which showed a mean sleep latency of 3.5 minutes with no sleep onset REM periods.  Medication list was not provided at the time.    He was diagnosed with sleep apnea over 10 years ago and has a CPAP machine but has not used it in years.  He has had  multiple sleep studies.  He also had a sleep study through [facility].  He reports being diagnosed with sleep apnea through [facility] in [place].  He also saw Dr. Brett Fairy several years ago in this clinic and had a home sleep test which per his report was negative for sleep apnea.  You ordered blood work, he had blood testing on 12/20/2020 including TSH, testosterone free and total, B12, CMP and CBC with differential, we will request test results from your office.   He had seen Dr. Krista Blue in our office in 2016 for memory loss and coordination problems. An EEG in our office was normal at the time.  In the past, through psychiatry, he has tried different wake promoting medications including Provigil, Nuvigil and Concerta.  He is currently on no wake promoting medicine.  He sees a psychiatrist remotely, uses the alprazolam as needed and Ambien CR generic as needed.  He takes melatonin as needed.  Bedtime is generally around 9 PM and rise time between 4 and 5 AM, he is often awake around 4 AM and cannot go back to sleep.  He has nocturia about twice per average night.  He drinks caffeine in the form of tea and coffee, 2 large cups of coffee or mugs per day and 2 to 3 cups of tea per day, he is a non-smoker and drinks alcohol rarely.  He is married and lives with his wife, he does not watch TV in the bedroom but does use his tablet computer before falling asleep.  His weight has been fluctuating,  when he was diagnosed with lymphoma he lost quite a bit of weight in the context of chemotherapy which completed about 3 months ago and he has gained about 10 pounds back.  His Past Medical History Is Significant For: Past Medical History:  Diagnosis Date  . Anxiety   . Depression   . Gait difficulty   . Lymphoma ([place]) dx'd 05/2020  . Memory loss     His Past Surgical History Is Significant For: Past Surgical History:  Procedure Laterality Date  . ABDOMINAL SURGERY     Childhood  .  BUNIONECTOMY    . IR IMAGING GUIDED PORT INSERTION  06/30/2020  . IR PARACENTESIS  06/26/2020  . IR US GUIDE BX ASP/DRAIN  06/26/2020    His Family History Is Significant For: Family History  Problem Relation Proctor of Onset  . Aneurysm Mother 43       Thoracic  . Hypertension Father   . Heart failure Father   . Heart disease Father   . Heart attack Paternal Grandfather   . Depression Cousin   . Bipolar disorder Cousin   . Schizophrenia Maternal Uncle     His Social History Is Significant For: Social History   Socioeconomic History  . Marital status: Married    Spouse name: Not on file  . Number of children: 0  . Years of education: Veterinary surgeon  . Highest education level: Not on file  Occupational History  . Occupation: Scientist, research (physical sciences)  Tobacco Use  . Smoking status: Never Smoker  . Smokeless tobacco: Never Used  Vaping Use  . Vaping Use: Never used  Substance and Sexual Activity  . Alcohol use: No    Alcohol/week: 0.0 standard drinks    Comment: Less than one drink per week.  . Drug use: No  . Sexual activity: Not Currently  Other Topics Concern  . Not on file  Social History Narrative   Lives at home with wife.   Right-handed.   3 cups caffeine per day.   Social Determinants of Health   Financial Resource Strain: Not on file  Food Insecurity: Not on file  Transportation Needs: Not on file  Physical Activity: Not on file  Stress: Not on file  Social Connections: Not on file    His Allergies Are:  Allergies  Allergen Reactions  . Omeprazole Nausea And Vomiting  :   His Current Medications Are:  Outpatient Encounter Medications as of 02/01/2021  Medication Sig  . ALPRAZolam (XANAX) 0.25 MG tablet Take 0.125-0.25 mg by mouth at bedtime.  . Ascorbic Acid (VITAMIN C) 1000 MG tablet Take 1,000 mg by mouth daily.  . B Complex Vitamins (VITAMIN B COMPLEX PO) Take 1 tablet by mouth daily.   . Multiple Vitamin (MULTIVITAMIN) capsule Take 1  capsule by mouth daily.  . pantoprazole (PROTONIX) 40 MG tablet Take 40 mg by mouth 2 (two) times daily as needed (indigestion).   Marland Kitchen VITAMIN D PO Take by mouth.  . zolpidem (AMBIEN CR) 12.5 MG CR tablet TAKE 1 TABLET BY MOUTH AT BEDTIME AS NEEDED FOR SLEEP.  . [DISCONTINUED] guanFACINE (TENEX) 1 MG tablet TAKE 1/2 TABLET AT BEDTIME FOR 3 NIGHTS, THEN TAKE 1 TABLET AT BEDTIME (Patient not taking: Reported on 01/18/2021)  . [DISCONTINUED] senna-docusate (SENNA S) 8.6-50 MG tablet Take 2 tablets by mouth at bedtime. (Patient not taking: Reported on 01/18/2021)  . [DISCONTINUED] Vitamin D, Ergocalciferol, (DRISDOL) 1.25 MG (50000 UNIT) CAPS capsule Take 5,000 Units by mouth every  7 (seven) days.  . [EXPIRED] cilgavimab (part of EVUSHELD) injection SOLN 300 mg   . [EXPIRED] tixagevimab (part of EVUSHELD) injection SOLN 300 mg   . [DISCONTINUED] EPINEPHrine (EPI-PEN) injection 0.3 mg    No facility-administered encounter medications on file as of 02/01/2021.  :   Review of Systems:  Out of a complete 14 point review of systems, all are reviewed and negative with the exception of these symptoms as listed below: Review of Systems  Neurological:       Here for sleep consult. Prior sleep study 10+ years ago was started on CPAP but stopped very soon after starting his machine. He also reports difficulty staying asleep during the night and is unsure if he snores at night.  Epworth Sleepiness Scale 0= would never doze 1= slight chance of dozing 2= moderate chance of dozing 3= high chance of dozing  Sitting and reading:3 Watching TV:2 Sitting inactive in a public place (ex. Theater or meeting):2 As a passenger in a car for an hour without a break:1 Lying down to rest in the afternoon:2 Sitting and talking to someone:0 Sitting quietly after lunch (no alcohol):2 In a car, while stopped in traffic:2 Total:14     Objective:  Neurological Exam  Physical Exam Physical Examination:   Vitals:    02/01/21 1229  BP: 109/68  Pulse: 99    General Examination: The patient is a very pleasant 65 y.o. male in no acute distress. He appears well-developed and well-nourished and well groomed.   HEENT: Normocephalic, atraumatic, pupils are equal, round and reactive to light, extraocular tracking is good without limitation to gaze excursion or nystagmus noted. Hearing is grossly intact. Face is symmetric with normal facial animation. Speech is clear with no dysarthria noted. There is no hypophonia. There is no lip, neck/head, jaw or voice tremor. Neck is supple with full range of passive and active motion. There are no carotid bruits on auscultation. Oropharynx exam reveals: moderate mouth dryness, adequate dental hygiene and moderate airway crowding, due to small airway entry and prominent uvula, tonsils small.  Tongue protrudes centrally and palate elevates symmetrically, Mallampati class II.  Neck circumference of 15 inches.  Chest: Clear to auscultation without wheezing, rhonchi or crackles noted.  Heart: S1+S2+0, regular and normal without murmurs, rubs or gallops noted.   Abdomen: Soft, non-tender and non-distended with normal bowel sounds appreciated on auscultation.  Extremities: There is no pitting edema in the distal lower extremities bilaterally.   Skin: Warm and dry without trophic changes noted.   Musculoskeletal: exam reveals no obvious joint deformities, tenderness or joint swelling or erythema.   Neurologically:  Mental status: The patient is awake, alert and oriented in all 4 spheres. His immediate and remote memory, attention, language skills and fund of knowledge are appropriate. There is no evidence of aphasia, agnosia, apraxia or anomia. Speech is clear with normal prosody and enunciation. Thought process is linear. Mood is normal and affect is normal.  Cranial nerves II - XII are as described above under HEENT exam.  Motor exam: Normal bulk, strength and tone is noted. There  is no tremor, Romberg is negative. Fine motor skills and coordination: grossly intact.  Cerebellar testing: No dysmetria or intention tremor. There is no truncal or gait ataxia.  Sensory exam: intact to light touch in the upper and lower extremities.  Gait, station and balance: He stands easily. No veering to one side is noted. No leaning to one side is noted. Posture is Proctor-appropriate and stance  is narrow based. Gait shows normal stride length and normal pace. No problems turning are noted. Tandem walk is slightly challenging for him but doable.       Assessment and Plan:  In summary, George Proctor is a very pleasant 65 y.o.-year old male with an underlying medical history of lymphoma, aortic root dilatation, atypical chest pain, sinus tachycardia, anxiety, and depression, who presents for evaluation of his obstructive sleep apnea.  He was diagnosed with OSA several years ago and tried CPAP therapy but has not been on it for years.  He had multiple sleep studies, most of his records are not available for my review today.  In 2004 he was diagnosed with daytime somnolence but a complete med list was not provided at the time.  He is primarily complaining of difficulty maintaining sleep and residual effects from medications he tries for sleep.  He has tried multiple prescription and nonprescription medicines for his difficulty maintaining sleep and for daytime sleepiness through psychiatry he has tried several medications as well.  A complete medication list for his wake promoting agents are not available but he recalls trying Concerta and Nuvigil and Provigil in the past.  He is currently on as needed use of alprazolam low-dose through psychiatry as well as generic Ambien CR as needed.  He is agreeable to proceeding with a sleep study to reevaluate his sleep disordered breathing.  If he has obstructive sleep apnea, he would be willing to try a CPAP or AutoPap machine again.  We will plan a follow-up after  testing and I will try to get some of his previous sleep records as well.  There are currently no visible additional sleep study reports in his chart or in care everywhere.  We will also try to retrieve his home sleep test report from our old medical record system, when he saw Dr. Brett Fairy.  I answered all his questions today and he was in agreement with this plan.  Thank you very much for allowing me to participate in the care of this nice patient. If I can be of any further assistance to you please do not hesitate to call me at [phone].  Sincerely,   George Age, MD, PhD

## 2021-02-01 NOTE — Progress Notes (Signed)
Pt. received last Covid shot 01/19/21. Per Wilber Bihari NP ok to give Evusheld today. Evusheld handout given to pt. and he reviewed.

## 2021-02-05 DIAGNOSIS — F3132 Bipolar disorder, current episode depressed, moderate: Secondary | ICD-10-CM | POA: Diagnosis not present

## 2021-02-05 DIAGNOSIS — F411 Generalized anxiety disorder: Secondary | ICD-10-CM | POA: Diagnosis not present

## 2021-02-05 DIAGNOSIS — F332 Major depressive disorder, recurrent severe without psychotic features: Secondary | ICD-10-CM | POA: Diagnosis not present

## 2021-02-05 DIAGNOSIS — Z79899 Other long term (current) drug therapy: Secondary | ICD-10-CM | POA: Diagnosis not present

## 2021-02-05 DIAGNOSIS — C8590 Non-Hodgkin lymphoma, unspecified, unspecified site: Secondary | ICD-10-CM | POA: Diagnosis not present

## 2021-02-05 DIAGNOSIS — F331 Major depressive disorder, recurrent, moderate: Secondary | ICD-10-CM | POA: Diagnosis not present

## 2021-02-10 DIAGNOSIS — Z20822 Contact with and (suspected) exposure to covid-19: Secondary | ICD-10-CM | POA: Diagnosis not present

## 2021-02-14 ENCOUNTER — Telehealth: Payer: Self-pay

## 2021-02-14 NOTE — Telephone Encounter (Signed)
Pt currently has no insurance and is waiting to start new plan. When patient rec's new insurance information, he will call me back. Cancelling order until I hear back from patient.

## 2021-02-25 DIAGNOSIS — N3001 Acute cystitis with hematuria: Secondary | ICD-10-CM | POA: Diagnosis not present

## 2021-02-26 ENCOUNTER — Encounter: Payer: Self-pay | Admitting: Hematology

## 2021-02-27 ENCOUNTER — Encounter: Payer: Self-pay | Admitting: Hematology

## 2021-02-27 DIAGNOSIS — M546 Pain in thoracic spine: Secondary | ICD-10-CM | POA: Diagnosis not present

## 2021-03-05 ENCOUNTER — Encounter: Payer: Self-pay | Admitting: Hematology

## 2021-03-05 DIAGNOSIS — H2513 Age-related nuclear cataract, bilateral: Secondary | ICD-10-CM | POA: Diagnosis not present

## 2021-03-05 DIAGNOSIS — H524 Presbyopia: Secondary | ICD-10-CM | POA: Diagnosis not present

## 2021-03-12 ENCOUNTER — Ambulatory Visit (INDEPENDENT_AMBULATORY_CARE_PROVIDER_SITE_OTHER): Payer: BC Managed Care – PPO | Admitting: Neurology

## 2021-03-12 DIAGNOSIS — G4700 Insomnia, unspecified: Secondary | ICD-10-CM

## 2021-03-12 DIAGNOSIS — G4733 Obstructive sleep apnea (adult) (pediatric): Secondary | ICD-10-CM | POA: Diagnosis not present

## 2021-03-12 DIAGNOSIS — G4719 Other hypersomnia: Secondary | ICD-10-CM

## 2021-03-12 DIAGNOSIS — R634 Abnormal weight loss: Secondary | ICD-10-CM

## 2021-03-13 NOTE — Progress Notes (Signed)
See procedure note.

## 2021-03-15 ENCOUNTER — Telehealth: Payer: Self-pay

## 2021-03-15 NOTE — Telephone Encounter (Signed)
I called pt. No answer, left a message asking pt to call me back.   

## 2021-03-15 NOTE — Telephone Encounter (Signed)
-----   Message from Star Age, MD sent at 03/15/2021 12:24 PM EDT ----- Patient was referred by his primary care physician for evaluation of his chronic sleep disturbance.  I saw him on 02/01/2021.  He has had several sleep studies.  We proceeded with a home sleep test on 03/12/2021 which indicated overall mild obstructive sleep apnea.  He had previously tried CPAP therapy.  He does not currently use a machine.  If he is agreeable, we can try AutoPap therapy for his mild sleep apnea and to see if his sleep quality and sleep consolidation improve.  If he is agreeable, let me know, I will order AutoPap therapy and we will arrange for follow-up in sleep clinic in about 30 to 90 days after starting AutoPap therapy.  Alternative treatment options may include the use of a dental device.  If he would like to consult with a dentist regarding an oral appliance, we can make a referral.

## 2021-03-15 NOTE — Procedures (Signed)
   [facility] TEST (Watch PAT) REPORT  STUDY DATE: 03-12-21  DOB: 02/22/1956  MRN: [id number]  ORDERING CLINICIAN: Star Age, MD, PhD   REFERRING CLINICIAN: [name], MD   CLINICAL INFORMATION/HISTORY: 65 year old man with a history of lymphoma, aortic root dilatation, atypical chest pain, sinus tachycardia, anxiety, and depression, who reports chronic difficulty with sleep initiation and sleep maintenance for years, also daytime somnolence.  He was previously diagnosed with obstructive sleep apnea, but is currently not on CPAP.  Epworth sleepiness score: 14/24.  BMI: 22.8 kg/m  Neck Circumference: 15 "  FINDINGS:   Sleep Summary:   Total Recording Time (hours, min): 7 h 41 min  Total Sleep Time (hours, min):  5 h 50 min   Percent REM (%):    12.84%   Respiratory Indices:   Calculated pAHI (per hour):  6.7/hour         REM pAHI:       1.3/hour       NREM pAHI:   7.5/hour  Supine AHI:    13.5/hour    Oxygen Saturation Statistics:    Oxygen Saturation (%) Mean: 93%   Minimum oxygen saturation (%):       84%   O2 Saturation Range (%):   84 - 97%    O2 Saturation (minutes) <=88%: 3.0 min  Pulse Rate Statistics:   Pulse Mean (bpm):   84/min    Pulse Range:   (60 - 107/min)   IMPRESSION: OSA (obstructive sleep apnea), mild  RECOMMENDATION:  This home sleep test demonstrates overall mild obstructive sleep apnea with a total AHI of 6.7/hour and O2 nadir of 84%.  Intermittent mild to moderate snoring was noted, appeared to be more consistently moderate towards the end of the study.  Given the patient's medical history and sleep related complaints, treatment with positive airway pressure is recommended. This can be achieved in the form of autoPAP trial/titration at home. A full night CPAP titration study will help with proper treatment settings and mask fitting if needed. Alternative treatments include weight loss along with avoidance  of the supine sleep position, or an oral appliance in appropriate candidates.   Please note that untreated obstructive sleep apnea may carry additional perioperative morbidity. Patients with significant obstructive sleep apnea should receive perioperative PAP therapy and the surgeons and particularly the anesthesiologist should be informed of the diagnosis and the severity of the sleep disordered breathing. The patient should be cautioned not to drive, work at heights, or operate dangerous or heavy equipment when tired or sleepy. Review and reiteration of good sleep hygiene measures should be pursued with any patient. Other causes of the patient's symptoms, including circadian rhythm disturbances, an underlying mood disorder, medication effect and/or an underlying medical problem cannot be ruled out based on this test. Clinical correlation is recommended.   The patient and his referring provider will be notified of the test results. The patient will be seen in follow up in sleep clinic at [facility].  I certify that I have reviewed the raw data recording prior to the issuance of this report in accordance with the standards of the American Academy of Sleep Medicine (AASM).  INTERPRETING PHYSICIAN:   Star Age, MD, PhD  Board Certified in Neurology and Sleep Medicine  [facility] [address], [place], [place] [address] [phone]

## 2021-03-15 NOTE — Telephone Encounter (Signed)
Pt left message: returning Megan's call to discuss results of home sleep study. If she could call me back.

## 2021-03-21 NOTE — Telephone Encounter (Signed)
I called pt. No answer, left a message asking pt to call me back.   

## 2021-03-22 NOTE — Telephone Encounter (Signed)
Pt returned phone call to discuss sleep study results. Would like a call back

## 2021-03-22 NOTE — Telephone Encounter (Signed)
See mychart message from 03/22/21.

## 2021-03-23 DIAGNOSIS — M546 Pain in thoracic spine: Secondary | ICD-10-CM | POA: Diagnosis not present

## 2021-03-28 DIAGNOSIS — Z125 Encounter for screening for malignant neoplasm of prostate: Secondary | ICD-10-CM | POA: Diagnosis not present

## 2021-03-28 DIAGNOSIS — Z0000 Encounter for general adult medical examination without abnormal findings: Secondary | ICD-10-CM | POA: Diagnosis not present

## 2021-03-29 DIAGNOSIS — M545 Low back pain, unspecified: Secondary | ICD-10-CM | POA: Diagnosis not present

## 2021-03-30 DIAGNOSIS — M546 Pain in thoracic spine: Secondary | ICD-10-CM | POA: Diagnosis not present

## 2021-04-04 DIAGNOSIS — E291 Testicular hypofunction: Secondary | ICD-10-CM | POA: Diagnosis not present

## 2021-04-04 DIAGNOSIS — Z0001 Encounter for general adult medical examination with abnormal findings: Secondary | ICD-10-CM | POA: Diagnosis not present

## 2021-04-04 DIAGNOSIS — K219 Gastro-esophageal reflux disease without esophagitis: Secondary | ICD-10-CM | POA: Diagnosis not present

## 2021-04-04 DIAGNOSIS — I700 Atherosclerosis of aorta: Secondary | ICD-10-CM | POA: Diagnosis not present

## 2021-04-04 DIAGNOSIS — I723 Aneurysm of iliac artery: Secondary | ICD-10-CM | POA: Diagnosis not present

## 2021-04-13 ENCOUNTER — Encounter: Payer: Self-pay | Admitting: Hematology

## 2021-04-16 ENCOUNTER — Encounter: Payer: Self-pay | Admitting: Hematology

## 2021-04-19 ENCOUNTER — Telehealth: Payer: Self-pay | Admitting: Neurology

## 2021-04-19 DIAGNOSIS — G4733 Obstructive sleep apnea (adult) (pediatric): Secondary | ICD-10-CM

## 2021-04-19 NOTE — Progress Notes (Signed)
HEMATOLOGY/ONCOLOGY CLINIC NOTE  Date of Service: 04/20/2021  Patient Care Team: [name], MD as PCP - General (Internal Medicine) Debara Pickett Nadean Corwin, MD as PCP - Cardiology (Cardiology) [name], MD (Internal Medicine)  CHIEF COMPLAINTS/PURPOSE OF CONSULTATION:  Mx of high grade follicular lymphoma  HISTORY OF PRESENTING ILLNESS:   George Proctor is a wonderful 65 y.o. male who has been referred to [place] by Dr. Shelia Media for evaluation and management of possible wide-spread lymphoma - palpable mass in abdomen. Pt is accompanied today by his wife. The pt reports that he is doing well overall.   The pt reports that about three weeks ago he began experiencing abdominal fullness. Two weeks ago he began to feel abdominal pain. Lying down makes this discomfort worse. He has not had a restful sleep in two weeks. The abdominal fullness has caused lack of appetite, belching, and one episode of nausea and vomiting. He is currently eating 1/3-1/2 of his baseline. Pt has been taking Tramadol to control his pain, as well as a stool softener. His bowel movements look different, but he denies any constipation. He is hot natured and has mild night sweats, but denies drenching night sweats. Pt feels that he has been short of breath for awhile, but that this may have worsened lately. Pt follows with Dr. Benson Norway, GI.   He had abdominal surgery as a child after an accident. Pt has had GI bleeding and ulcers from taking NSAIDS. Pt has had several instances of abdominal swelling that were triggered by illness. Pt had esophageal dialation. His strictures were thought to be caused by acid reflux. He also has an umbilical hernia. He has chronic back pain. Pt was diagnosed with chronic lyme disease by a [place]. He has a history of balance issues and has brought this up to his PCP, who did not feel that it required a work up. He does not have a true allergy, but experiences nausea and vomiting when taking  Omeprazole. Pt denies any international travel in the last year.   Pt has been taking weekly Testosterone for 3-4 months. He was experiencing fatigue prior to beginning the hormonal injections. Pt was placed on Latuda & Lamictil several weeks ago for his Depression and Anxiety, but discontinued a week ago as he did not see any improvement in mood. He is not currently following with a mental health professional.   Dr. Shelia Media referred pt to Dr. Redmond Pulling at [facility] for biopsy consideration. Pt is scheduled to see Dr. Shelia Media next Wednesday.   Of note prior to the patient's visit today, pt has had  CT Abd/Pel completed on 06/05/2020 with results revealing numerous cardiophrenic, retroperitoneal and mesenteric adenopathy most consistent with lymphoma. Small volume of ascites. Hepatosplenomegaly.  Most recent lab results (03/22/2020) of CBC is as follows: all values are WNL except for WBC at 3.5K, PLT at 131K, Lymphs Rel at 17.6, Total Protein at 6.3.  On review of systems, pt reports worsening SOB, abdominal pain, abdominal fullness, low appetite, dyspepsia, mild night sweats and denies fevers, chills, rash, headaches, leg swelling, urinary habit changes, constipation, testicular pain/swelling and any other symptoms.   On PMHx the pt reports Depression, Anxiety, Abdominal Surgery, Gait difficulty, Esophageal dilation. On Family Hx the pt reports that his father and paternal aunt had polymyalgia rheumatica. His paternal aunt also had breast cancer.  INTERVAL HISTORY:  George Proctor is a wonderful 65 y.o. male who is here for evaluation and management of lymphoma.  The patient's last visit with [place] was on 01/18/2021. Pt is accompanied today by his wife.The pt reports that he is doing well overall.  The pt reports no acute new symptoms. No constitutional symptoms. No new lumps or bumps. No abdominal pain or SOB.  Lab results today 04/20/2021 of CBC w/diff and CMP - reviewed with  patient.  On review of systems, pt reports continued engagement with his psychological counselor. Wife notes she is encouraging him to eat better.  MEDICAL HISTORY:  Past Medical History:  Diagnosis Date   Anxiety    Depression    Gait difficulty    Lymphoma ([place]) dx'd 05/2020   Memory loss    SURGICAL HISTORY: Past Surgical History:  Procedure Laterality Date   ABDOMINAL SURGERY     Childhood   BUNIONECTOMY     IR IMAGING GUIDED PORT INSERTION  06/30/2020   IR PARACENTESIS  06/26/2020   IR US GUIDE BX ASP/DRAIN  06/26/2020    SOCIAL HISTORY: Social History   Socioeconomic History   Marital status: Married    Spouse name: Not on file   Number of children: 0   Years of education: [employer]   Highest education level: Not on file  Occupational History   Occupation: Scientist, research (physical sciences)  Tobacco Use   Smoking status: Never   Smokeless tobacco: Never  Vaping Use   Vaping Use: Never used  Substance and Sexual Activity   Alcohol use: No    Alcohol/week: 0.0 standard drinks    Comment: Less than one drink per week.   Drug use: No   Sexual activity: Not Currently  Other Topics Concern   Not on file  Social History Narrative   Lives at home with wife.   Right-handed.   3 cups caffeine per day.   Social Determinants of Health   Financial Resource Strain: Not on file  Food Insecurity: Not on file  Transportation Needs: Not on file  Physical Activity: Not on file  Stress: Not on file  Social Connections: Not on file  Intimate Partner Violence: Not on file    FAMILY HISTORY: Family History  Problem Relation Age of Onset   Aneurysm Mother 66       Thoracic   Hypertension Father    Heart failure Father    Heart disease Father    Heart attack Paternal Grandfather    Depression Cousin    Bipolar disorder Cousin    Schizophrenia Maternal Uncle     ALLERGIES:  is allergic to omeprazole.  MEDICATIONS:  Current Outpatient Medications   Medication Sig Dispense Refill   ALPRAZolam (XANAX) 0.25 MG tablet Take 0.125-0.25 mg by mouth at bedtime.     Ascorbic Acid (VITAMIN C) 1000 MG tablet Take 1,000 mg by mouth daily.     B Complex Vitamins (VITAMIN B COMPLEX PO) Take 1 tablet by mouth daily.      Multiple Vitamin (MULTIVITAMIN) capsule Take 1 capsule by mouth daily.     pantoprazole (PROTONIX) 40 MG tablet Take 40 mg by mouth 2 (two) times daily as needed (indigestion).      VITAMIN D PO Take by mouth.     zolpidem (AMBIEN CR) 12.5 MG CR tablet TAKE 1 TABLET BY MOUTH AT BEDTIME AS NEEDED FOR SLEEP. 30 tablet 0   No current facility-administered medications for this visit.    REVIEW OF SYSTEMS:   10 Point review of Systems was done is negative except as noted above.  PHYSICAL EXAMINATION:  ECOG PERFORMANCE STATUS: 2 - Symptomatic, <50% confined to bed  . Vitals:   04/20/21 1301  BP: 112/79  Pulse: 88  Resp: 18  Temp: 98.1 F (36.7 C)  SpO2: 98%    Filed Weights   04/20/21 1301  Weight: 167 lb 1.6 oz (75.8 kg)    .Body mass index is 22.05 kg/m.  NAD GENERAL:alert, in no acute distress and comfortable SKIN: no acute rashes, no significant lesions EYES: conjunctiva are pink and non-injected, sclera anicteric OROPHARYNX: MMM, no exudates, no oropharyngeal erythema or ulceration NECK: supple, no JVD LYMPH:  no palpable lymphadenopathy in the cervical, axillary or inguinal regions LUNGS: clear to auscultation b/l with normal respiratory effort HEART: regular rate & rhythm ABDOMEN:  normoactive bowel sounds , non tender, not distended. Extremity: no pedal edema PSYCH: alert & oriented x 3 with fluent speech NEURO: no focal motor/sensory deficits  LABORATORY DATA:  I have reviewed the data as listed  . CBC Latest Ref Rng & Units 04/20/2021 01/05/2021 10/13/2020  WBC 4.0 - 10.5 K/uL 3.5(L) 2.9(L) 6.7  Hemoglobin 13.0 - 17.0 g/dL 13.0 13.8 12.2(L)  Hematocrit 39.0 - 52.0 % 39.1 41.8 38.3(L)  Platelets 150  - 400 K/uL 162 164 209    . CMP Latest Ref Rng & Units 04/20/2021 01/05/2021 10/13/2020  Glucose 70 - 99 mg/dL 88 84 109(H)  BUN 8 - 23 mg/dL '11 15 13  '$ Creatinine 0.61 - 1.24 mg/dL 0.82 0.81 0.83  Sodium 135 - 145 mmol/L 143 141 140  Potassium 3.5 - 5.1 mmol/L 4.1 4.2 4.0  Chloride 98 - 111 mmol/L 107 106 108  CO2 22 - 32 mmol/L '26 27 26  '$ Calcium 8.9 - 10.3 mg/dL 9.3 9.2 8.9  Total Protein 6.5 - 8.1 g/dL 6.1(L) 6.2(L) 6.0(L)  Total Bilirubin 0.3 - 1.2 mg/dL 0.5 0.4 0.4  Alkaline Phos 38 - 126 U/L 78 75 71  AST 15 - 41 U/L '17 22 20  '$ ALT 0 - 44 U/L '18 25 26   '$ . Lab Results  Component Value Date   LDH 173 04/20/2021    RADIOGRAPHIC STUDIES: I have personally reviewed the radiological images as listed and agreed with the findings in the report. No results found.      ASSESSMENT & PLAN:   65 yo with   1) Stage III/IV high grade follicular lymphoma S/p 6 cycles of R-CHOP with CR  PLAN: -Discussed pt labwork today, 04/20/2021; cbc/diff and cmp -- WNL and unremarkable. -LDH wnl -no lab or clinical evidence of lymphoma progression at this time. -Advised pt we can remove Port at this time if desired.- patient prefers to do this. -Recommend pt eat a healthy diet, walk 20-30 min daily, and drink 48-64 oz.  FOLLOW UP: IR for port a cath removal in 1-2 weeks RTC with Dr [name] with labs in 3 months   The total time spent in the appointment was 20 minutes and more than 50% was on counseling and direct patient cares.   All of the patient's questions were answered with apparent satisfaction. The patient knows to call the clinic with any problems, questions or concerns.    Sullivan Lone MD [place] AAHIVMS [facility] Hematology/Oncology Physician [facility]  (Office):       [phone] (Work cell):  [phone] (Fax):           [phone]  04/19/2021 9:10 PM  I, Reinaldo Raddle, am acting as scribe for Dr. Sullivan Lone, MD.    .I  have reviewed the above documentation for  accuracy and completeness, and I agree with the above. Brunetta Genera MD

## 2021-04-19 NOTE — Telephone Encounter (Signed)
Noted, [employer] advised of order being placed.  Pt has been advised to CB once he has started so we can get him set up for f/u within 31-90 days.

## 2021-04-19 NOTE — Telephone Encounter (Signed)
  [employer] order placed, please process.  Patient will need a follow-up appointment according to his start date.

## 2021-04-20 ENCOUNTER — Other Ambulatory Visit: Payer: Self-pay

## 2021-04-20 ENCOUNTER — Inpatient Hospital Stay: Payer: BC Managed Care – PPO | Attending: Hematology | Admitting: Hematology

## 2021-04-20 ENCOUNTER — Inpatient Hospital Stay: Payer: BC Managed Care – PPO

## 2021-04-20 VITALS — BP 112/79 | HR 88 | Temp 98.1°F | Resp 18 | Wt 167.1 lb

## 2021-04-20 DIAGNOSIS — Z452 Encounter for adjustment and management of vascular access device: Secondary | ICD-10-CM | POA: Diagnosis not present

## 2021-04-20 DIAGNOSIS — C8248 Follicular lymphoma grade IIIb, lymph nodes of multiple sites: Secondary | ICD-10-CM

## 2021-04-20 DIAGNOSIS — Z8572 Personal history of non-Hodgkin lymphomas: Secondary | ICD-10-CM | POA: Insufficient documentation

## 2021-04-20 DIAGNOSIS — Z95828 Presence of other vascular implants and grafts: Secondary | ICD-10-CM | POA: Diagnosis not present

## 2021-04-20 DIAGNOSIS — C8598 Non-Hodgkin lymphoma, unspecified, lymph nodes of multiple sites: Secondary | ICD-10-CM

## 2021-04-20 DIAGNOSIS — Z5111 Encounter for antineoplastic chemotherapy: Secondary | ICD-10-CM

## 2021-04-20 LAB — CBC WITH DIFFERENTIAL/PLATELET
Abs Immature Granulocytes: 0.01 10*3/uL (ref 0.00–0.07)
Basophils Absolute: 0 10*3/uL (ref 0.0–0.1)
Basophils Relative: 1 %
Eosinophils Absolute: 0.1 10*3/uL (ref 0.0–0.5)
Eosinophils Relative: 4 %
HCT: 39.1 % (ref 39.0–52.0)
Hemoglobin: 13 g/dL (ref 13.0–17.0)
Immature Granulocytes: 0 %
Lymphocytes Relative: 23 %
Lymphs Abs: 0.8 10*3/uL (ref 0.7–4.0)
MCH: 29.5 pg (ref 26.0–34.0)
MCHC: 33.2 g/dL (ref 30.0–36.0)
MCV: 88.7 fL (ref 80.0–100.0)
Monocytes Absolute: 0.4 10*3/uL (ref 0.1–1.0)
Monocytes Relative: 11 %
Neutro Abs: 2.1 10*3/uL (ref 1.7–7.7)
Neutrophils Relative %: 61 %
Platelets: 162 10*3/uL (ref 150–400)
RBC: 4.41 MIL/uL (ref 4.22–5.81)
RDW: 13.6 % (ref 11.5–15.5)
WBC: 3.5 10*3/uL — ABNORMAL LOW (ref 4.0–10.5)
nRBC: 0 % (ref 0.0–0.2)

## 2021-04-20 LAB — CMP (CANCER CENTER ONLY)
ALT: 18 U/L (ref 0–44)
AST: 17 U/L (ref 15–41)
Albumin: 4.2 g/dL (ref 3.5–5.0)
Alkaline Phosphatase: 78 U/L (ref 38–126)
Anion gap: 10 (ref 5–15)
BUN: 11 mg/dL (ref 8–23)
CO2: 26 mmol/L (ref 22–32)
Calcium: 9.3 mg/dL (ref 8.9–10.3)
Chloride: 107 mmol/L (ref 98–111)
Creatinine: 0.82 mg/dL (ref 0.61–1.24)
GFR, Estimated: >60 mL/min (ref >60)
Glucose, Bld: 88 mg/dL (ref 70–99)
Potassium: 4.1 mmol/L (ref 3.5–5.1)
Sodium: 143 mmol/L (ref 135–145)
Total Bilirubin: 0.5 mg/dL (ref 0.3–1.2)
Total Protein: 6.1 g/dL — ABNORMAL LOW (ref 6.5–8.1)

## 2021-04-20 LAB — LACTATE DEHYDROGENASE: LDH: 173 U/L (ref 98–192)

## 2021-04-20 MED ORDER — HEPARIN SOD (PORK) LOCK FLUSH 100 UNIT/ML IV SOLN
500.0000 [IU] | Freq: Once | INTRAVENOUS | Status: AC | PRN
  Administered 2021-04-20: 500 [IU]
  Filled 2021-04-20: qty 5

## 2021-04-20 MED ORDER — SODIUM CHLORIDE 0.9% FLUSH
10.0000 mL | INTRAVENOUS | Status: DC | PRN
  Administered 2021-04-20: 10 mL
  Filled 2021-04-20: qty 10

## 2021-04-23 ENCOUNTER — Telehealth: Payer: Self-pay | Admitting: Hematology

## 2021-04-23 NOTE — Telephone Encounter (Signed)
Scheduled appts per 8/5 los. Pt aware.

## 2021-04-26 ENCOUNTER — Encounter: Payer: Self-pay | Admitting: Hematology

## 2021-05-01 DIAGNOSIS — R140 Abdominal distension (gaseous): Secondary | ICD-10-CM | POA: Diagnosis not present

## 2021-05-01 DIAGNOSIS — I700 Atherosclerosis of aorta: Secondary | ICD-10-CM | POA: Diagnosis not present

## 2021-05-01 DIAGNOSIS — K219 Gastro-esophageal reflux disease without esophagitis: Secondary | ICD-10-CM | POA: Diagnosis not present

## 2021-05-08 ENCOUNTER — Encounter: Payer: Self-pay | Admitting: Hematology

## 2021-05-14 DIAGNOSIS — Z8249 Family history of ischemic heart disease and other diseases of the circulatory system: Secondary | ICD-10-CM | POA: Diagnosis not present

## 2021-05-14 DIAGNOSIS — E785 Hyperlipidemia, unspecified: Secondary | ICD-10-CM | POA: Diagnosis not present

## 2021-05-14 DIAGNOSIS — R931 Abnormal findings on diagnostic imaging of heart and coronary circulation: Secondary | ICD-10-CM | POA: Diagnosis not present

## 2021-05-14 LAB — LIPID PANEL
Chol/HDL Ratio: 4.2 ratio (ref 0.0–5.0)
Cholesterol, Total: 146 mg/dL (ref 100–199)
HDL: 35 mg/dL — ABNORMAL LOW (ref >39)
LDL Chol Calc (NIH): 88 mg/dL (ref 0–99)
Triglycerides: 126 mg/dL (ref 0–149)
VLDL Cholesterol Cal: 23 mg/dL (ref 5–40)

## 2021-05-16 ENCOUNTER — Other Ambulatory Visit: Payer: Self-pay | Admitting: Student

## 2021-05-17 ENCOUNTER — Ambulatory Visit (HOSPITAL_COMMUNITY)
Admission: RE | Admit: 2021-05-17 | Discharge: 2021-05-17 | Disposition: A | Discharge status: Home / Self Care | Payer: BC Managed Care – PPO | Source: Ambulatory Visit | Attending: Hematology | Admitting: Hematology

## 2021-05-17 ENCOUNTER — Encounter (HOSPITAL_COMMUNITY): Payer: Self-pay

## 2021-05-17 ENCOUNTER — Other Ambulatory Visit: Payer: Self-pay

## 2021-05-17 DIAGNOSIS — Z888 Allergy status to other drugs, medicaments and biological substances status: Secondary | ICD-10-CM | POA: Diagnosis not present

## 2021-05-17 DIAGNOSIS — Z452 Encounter for adjustment and management of vascular access device: Secondary | ICD-10-CM | POA: Diagnosis not present

## 2021-05-17 DIAGNOSIS — Z79899 Other long term (current) drug therapy: Secondary | ICD-10-CM | POA: Diagnosis not present

## 2021-05-17 DIAGNOSIS — Z8572 Personal history of non-Hodgkin lymphomas: Secondary | ICD-10-CM | POA: Diagnosis not present

## 2021-05-17 DIAGNOSIS — Z95828 Presence of other vascular implants and grafts: Secondary | ICD-10-CM

## 2021-05-17 DIAGNOSIS — C8598 Non-Hodgkin lymphoma, unspecified, lymph nodes of multiple sites: Secondary | ICD-10-CM

## 2021-05-17 HISTORY — PX: IR REMOVAL TUN ACCESS W/ PORT W/O FL MOD SED: IMG2290

## 2021-05-17 MED ORDER — FENTANYL CITRATE (PF) 100 MCG/2ML IJ SOLN
INTRAMUSCULAR | Status: AC | PRN
  Administered 2021-05-17 (×2): 50 ug via INTRAVENOUS

## 2021-05-17 MED ORDER — MIDAZOLAM HCL 2 MG/2ML IJ SOLN
INTRAMUSCULAR | Status: AC
  Filled 2021-05-17: qty 2

## 2021-05-17 MED ORDER — SODIUM CHLORIDE 0.9 % IV SOLN: INTRAVENOUS | Status: DC

## 2021-05-17 MED ORDER — MIDAZOLAM HCL 2 MG/2ML IJ SOLN
INTRAMUSCULAR | Status: AC | PRN
  Administered 2021-05-17 (×2): 1 mg via INTRAVENOUS

## 2021-05-17 MED ORDER — LIDOCAINE-EPINEPHRINE (PF) 1 %-1:200000 IJ SOLN
INTRAMUSCULAR | Status: AC | PRN
  Administered 2021-05-17: 10 mL

## 2021-05-17 MED ORDER — LIDOCAINE-EPINEPHRINE 1 %-1:100000 IJ SOLN
INTRAMUSCULAR | Status: AC
  Filled 2021-05-17: qty 1

## 2021-05-17 MED ORDER — FENTANYL CITRATE (PF) 100 MCG/2ML IJ SOLN
INTRAMUSCULAR | Status: AC
  Filled 2021-05-17: qty 2

## 2021-05-17 NOTE — Procedures (Signed)
Vascular and Interventional Radiology Procedure Note  Patient: George Proctor DOB: 12-Oct-1955 Medical Record Number: [id number] Note Date/Time: 05/17/21 1:06 PM   Performing Physician: Michaelle Birks, MD Assistant(s): None  Diagnosis: Lymphoma. Rx completion.  Procedure: PORT REMOVAL  Anesthesia: Conscious Sedation Complications: None Estimated Blood Loss: Minimal Specimens:  None  Findings:  Successful removal of a right-sided venous port. Primary incision closure. Dermabond at skin.  See detailed procedure note with images in PACS. The patient tolerated the procedure well without incident or complication and was returned to Recovery in stable condition.    Michaelle Birks, MD [facility] Radiology   Pager. [place]

## 2021-05-17 NOTE — Discharge Instructions (Signed)
Interventional radiology phone numbers [phone] After hours [phone]  Implanted Port Removal, Care After This sheet gives you information about how to care for yourself after your procedure. Your health care provider may also give you more specific instructions. If you have problems or questions, contact your health care provider. What can I expect after the procedure? After the procedure, it is common to have: Soreness or pain near your incision. Some swelling or bruising near your incision. Follow these instructions at home: Medicines Take over-the-counter and prescription medicines only as told by your health care provider. If you were prescribed an antibiotic medicine, take it as told by your health care provider. Do not stop taking the antibiotic even if you start to feel better. Bathing Do not take baths, swim, or use a hot tub until your health care provider approves. You may shower tomorrow. Remove your dressing prior to your shower. Incision care Follow instructions from your health care provider about how to take care of your incision. Make sure you: Wash your hands with soap and water before you change your bandage (dressing). If soap and water are not available, use hand sanitizer. Keep your dressing dry. Leave  skin glue in place. These skin closures may need to stay in place for 2 weeks or longer. . Check your incision area every day for signs of infection. Check for: More redness, swelling, or pain. More fluid or blood. Warmth. Pus or a bad smell.   Driving Do not drive for 24 hours if you were given a medicine to help you relax (sedative) during your procedure. If you did not receive a sedative, ask your health care provider when it is safe to drive.   Activity Return to your normal activities as told by your health care provider. Ask your health care provider what activities are safe for you. Do not lift anything that is heavier than 10 lb (4.5 kg), or the  limit that you are told, until your health care provider says that it is safe. Do not do activities that involve lifting your arms over your head. General instructions Do not use any products that contain nicotine or tobacco, such as cigarettes and e-cigarettes. These can delay healing. If you need help quitting, ask your health care provider. Keep all follow-up visits as told by your health care provider. This is important. Contact a health care provider if: You have more redness, swelling, or pain around your incision. You have more fluid or blood coming from your incision. Your incision feels warm to the touch. You have pus or a bad smell coming from your incision. You have pain that is not relieved by your pain medicine. Get help right away if you have: A fever or chills. Chest pain. Difficulty breathing. Summary After the procedure, it is common to have pain, soreness, swelling, or bruising near your incision. If you were prescribed an antibiotic medicine, take it as told by your health care provider. Do not stop taking the antibiotic even if you start to feel better. Do not drive for 24 hours if you were given a sedative during your procedure. Return to your normal activities as told by your health care provider. Ask your health care provider what activities are safe for you. This information is not intended to replace advice given to you by your health care provider. Make sure you discuss any questions you have with your health care provider. Document Revised: 10/16/2017 Document Reviewed: 10/16/2017 Elsevier Patient Education  2021 Elsevier Inc.       Moderate Conscious Sedation, Adult, Care After This sheet gives you information about how to care for yourself after your procedure. Your health care provider may also give you more specific instructions. If you have problems or questions, contact your health care provider. What can I expect after the procedure? After the procedure,  it is common to have: Sleepiness for several hours. Impaired judgment for several hours. Difficulty with balance. Vomiting if you eat too soon. Follow these instructions at home: For the time period you were told by your health care provider: Rest. Do not participate in activities where you could fall or become injured. Do not drive or use machinery. Do not drink alcohol. Do not take sleeping pills or medicines that cause drowsiness. Do not make important decisions or sign legal documents. Do not take care of children on your own.      Eating and drinking Follow the diet recommended by your health care provider. Drink enough fluid to keep your urine pale yellow. If you vomit: Drink water, juice, or soup when you can drink without vomiting. Make sure you have little or no nausea before eating solid foods.   General instructions Take over-the-counter and prescription medicines only as told by your health care provider. Have a responsible adult stay with you for the time you are told. It is important to have someone help care for you until you are awake and alert. Do not smoke. Keep all follow-up visits as told by your health care provider. This is important. Contact a health care provider if: You are still sleepy or having trouble with balance after 24 hours. You feel light-headed. You keep feeling nauseous or you keep vomiting. You develop a rash. You have a fever. You have redness or swelling around the IV site. Get help right away if: You have trouble breathing. You have new-onset confusion at home. Summary After the procedure, it is common to feel sleepy, have impaired judgment, or feel nauseous if you eat too soon. Rest after you get home. Know the things you should not do after the procedure. Follow the diet recommended by your health care provider and drink enough fluid to keep your urine pale yellow. Get help right away if you have trouble breathing or new-onset  confusion at home. This information is not intended to replace advice given to you by your health care provider. Make sure you discuss any questions you have with your health care provider. Document Revised: 12/31/2019 Document Reviewed: 07/29/2019 Elsevier Patient Education  2021 Elsevier Inc.  

## 2021-05-17 NOTE — H&P (Signed)
Chief Complaint: Patient was seen in consultation today for  at the request of Brunetta Genera  Referring Physician(s): Brunetta Genera  Supervising Physician: Michaelle Birks  Patient Status: [facility] - Out-pt  History of Present Illness: George Proctor is a 65 y.o. male with hx of lymphoma. Underwent chemotherapy via a port that was placed 06/30/2020 Has now completed treatment and is referred for port removal. PMHx, meds, labs, imaging, allergies reviewed. Feels well, no recent fevers, chills, illness. Has been NPO today as directed.    Past Medical History:  Diagnosis Date   Anxiety    Depression    Gait difficulty    Lymphoma ([place]) dx'd 05/2020   Memory loss     Past Surgical History:  Procedure Laterality Date   ABDOMINAL SURGERY     Childhood   BUNIONECTOMY     IR IMAGING GUIDED PORT INSERTION  06/30/2020   IR PARACENTESIS  06/26/2020   IR US GUIDE BX ASP/DRAIN  06/26/2020    Allergies: Omeprazole  Medications: Prior to Admission medications   Medication Sig Start Date End Date Taking? Authorizing Provider  ALPRAZolam (XANAX) 0.25 MG tablet Take 0.125-0.25 mg by mouth at bedtime. 12/06/20  Yes [provider]  Ascorbic Acid (VITAMIN C) 1000 MG tablet Take 1,000 mg by mouth daily.   Yes [provider]  Multiple Vitamin (MULTIVITAMIN) capsule Take 1 capsule by mouth daily.   Yes [provider]  pantoprazole (PROTONIX) 40 MG tablet Take 40 mg by mouth 2 (two) times daily as needed (indigestion).  01/10/19  Yes [provider]  rosuvastatin (CRESTOR) 5 MG tablet Take 5 mg by mouth daily. 04/16/21  Yes [provider]  VITAMIN D PO Take by mouth.   Yes [provider]  zolpidem (AMBIEN CR) 12.5 MG CR tablet TAKE 1 TABLET BY MOUTH AT BEDTIME AS NEEDED FOR SLEEP. 10/03/20  Yes Brunetta Genera, MD  B Complex Vitamins (VITAMIN B COMPLEX PO) Take 1 tablet by mouth daily.     [provider]      Family History  Problem Relation Age of Onset   Aneurysm Mother 53       Thoracic   Hypertension Father    Heart failure Father    Heart disease Father    Heart attack Paternal Grandfather    Depression Cousin    Bipolar disorder Cousin    Schizophrenia Maternal Uncle     Social History   Socioeconomic History   Marital status: Married    Spouse name: Not on file   Number of children: 0   Years of education: [employer]   Highest education level: Not on file  Occupational History   Occupation: Scientist, research (physical sciences)  Tobacco Use   Smoking status: Never   Smokeless tobacco: Never  Vaping Use   Vaping Use: Never used  Substance and Sexual Activity   Alcohol use: No    Alcohol/week: 0.0 standard drinks    Comment: Less than one drink per week.   Drug use: No   Sexual activity: Not Currently  Other Topics Concern   Not on file  Social History Narrative   Lives at home with wife.   Right-handed.   3 cups caffeine per day.   Social Determinants of Health   Financial Resource Strain: Not on file  Food Insecurity: Not on file  Transportation Needs: Not on file  Physical Activity: Not on file  Stress: Not on file  Social Connections: Not  on file     Review of Systems: A 12 point ROS discussed and pertinent positives are indicated in the HPI above.  All other systems are negative.  Review of Systems  Vital Signs: BP 129/88   Pulse 79   Temp 98.5 F (36.9 C) (Oral)   Resp 18   SpO2 100%   Physical Exam Constitutional:      Appearance: Normal appearance. He is not ill-appearing.  HENT:     Mouth/Throat:     Mouth: Mucous membranes are moist.     Pharynx: Oropharynx is clear.  Cardiovascular:     Rate and Rhythm: Normal rate and regular rhythm.     Heart sounds: Normal heart sounds.  Pulmonary:     Effort: Pulmonary effort is normal. No respiratory distress.     Breath sounds: Normal breath sounds.  Skin:    General: Skin is warm and  dry.     Comments: (R)chest port palpable. No overlying skin issues, incision well healed  Neurological:     General: No focal deficit present.     Mental Status: He is alert and oriented to person, place, and time.      Imaging: No results found.  Labs:  CBC: Recent Labs    09/22/20 1027 10/13/20 0935 01/05/21 1050 04/20/21 1217  WBC 5.2 6.7 2.9* 3.5*  HGB 11.3* 12.2* 13.8 13.0  HCT 35.1* 38.3* 41.8 39.1  PLT 255 209 164 162    COAGS: Recent Labs    06/24/20 1345 06/25/20 0305  INR 1.0 1.1  APTT 32  --     BMP: Recent Labs    06/15/20 1537 06/24/20 1345 09/22/20 1027 10/13/20 0935 01/05/21 1050 04/20/21 1217  NA 138   < > 141 140 141 143  K 4.3   < > 4.0 4.0 4.2 4.1  CL 100   < > 107 108 106 107  CO2 26   < > '27 26 27 26  '$ GLUCOSE 82   < > 99 109* 84 88  BUN 10   < > '12 13 15 11  '$ CALCIUM 9.3   < > 9.1 8.9 9.2 9.3  CREATININE 1.06   < > 0.77 0.83 0.81 0.82  GFRNONAA >60   < > >60 >60 >60 >60  GFRAA >60  --   --   --   --   --    < > = values in this interval not displayed.    LIVER FUNCTION TESTS: Recent Labs    09/22/20 1027 10/13/20 0935 01/05/21 1050 04/20/21 1217  BILITOT 0.5 0.4 0.4 0.5  AST '22 20 22 17  '$ ALT '24 26 25 18  '$ ALKPHOS 77 71 75 78  PROT 6.0* 6.0* 6.2* 6.1*  ALBUMIN 3.9 4.0 4.3 4.2    TUMOR MARKERS: No results for input(s): AFPTM, CEA, CA199, CHROMGRNA in the last 8760 hours.  Assessment and Plan: Hx of lymphoma. Treatment completed, plan for port removal today Risks and benefits of image guided port-a-catheter removal was discussed with the patient including, but not limited to bleeding, infection, pneumothorax, or fibrin sheath development and need for additional procedures.  All of the patient's questions were answered, patient is agreeable to proceed. Consent signed and in chart.   Thank you for this interesting consult.  I greatly enjoyed meeting George Proctor and look forward to participating in their care.  A  copy of this report was sent to the requesting provider on this date.  Electronically Signed: Lennette Bihari  Gomer France, PA-C 05/17/2021, 11:14 AM   I spent a total of 20 minutes in face to face in clinical consultation, greater than 50% of which was counseling/coordinating care for port removal

## 2021-05-18 DIAGNOSIS — G4733 Obstructive sleep apnea (adult) (pediatric): Secondary | ICD-10-CM | POA: Diagnosis not present

## 2021-05-22 ENCOUNTER — Telehealth: Payer: Self-pay | Admitting: Neurology

## 2021-05-22 ENCOUNTER — Telehealth (INDEPENDENT_AMBULATORY_CARE_PROVIDER_SITE_OTHER): Payer: BC Managed Care – PPO | Admitting: Internal Medicine

## 2021-05-22 ENCOUNTER — Encounter: Payer: Self-pay | Admitting: Internal Medicine

## 2021-05-22 VITALS — Ht 73.0 in | Wt 167.0 lb

## 2021-05-22 DIAGNOSIS — I712 Thoracic aortic aneurysm, without rupture, unspecified: Secondary | ICD-10-CM

## 2021-05-22 DIAGNOSIS — R931 Abnormal findings on diagnostic imaging of heart and coronary circulation: Secondary | ICD-10-CM

## 2021-05-22 DIAGNOSIS — I700 Atherosclerosis of aorta: Secondary | ICD-10-CM

## 2021-05-22 DIAGNOSIS — E785 Hyperlipidemia, unspecified: Secondary | ICD-10-CM | POA: Diagnosis not present

## 2021-05-22 NOTE — Patient Instructions (Signed)
Medication Instructions:  STOP rosuvastatin (crestor)  *If you need a refill on your cardiac medications before your next appointment, please call your pharmacy*   Lab Work: FASTING lab work to check cholesterol in about 6 months  If you have labs (blood work) drawn today and your tests are completely normal, you will receive your results only by: [place] (if you have MyChart) OR A paper copy in the mail If you have any lab test that is abnormal or we need to change your treatment, we will call you to review the results.   Testing/Procedures: NONE   Follow-Up: At [facility], you and your health needs are our priority.  As part of our continuing mission to provide you with exceptional heart care, we have created designated Provider Care Teams.  These Care Teams include your primary Cardiologist (physician) and Advanced Practice Providers (APPs -  Physician Assistants and Nurse Practitioners) who all work together to provide you with the care you need, when you need it.  We recommend signing up for the patient portal called "MyChart".  Sign up information is provided on this After Visit Summary.  MyChart is used to connect with patients for Virtual Visits (Telemedicine).  Patients are able to view lab/test results, encounter notes, upcoming appointments, etc.  Non-urgent messages can be sent to your provider as well.   To learn more about what you can do with MyChart, go to [email or web address].    Your next appointment:   6 month(s)  The format for your next appointment:   In Person or Virtual  Provider:   Raliegh Ip Mali Hilty, MD   Other Instructions

## 2021-05-22 NOTE — Telephone Encounter (Signed)
Called pt to schedule Initial Cpap. LVM for pt to call back. Pt is needing to be scheduled between 06/18/21-08/16/21.  DME: [employer] [id number] F: [id number] Equipment Issued: Lewayne Bunting on 05/18/21

## 2021-05-22 NOTE — Progress Notes (Signed)
Virtual Visit via Video Note   This visit type was conducted due to national recommendations for restrictions regarding the COVID-19 Pandemic (e.g. social distancing) in an effort to limit this patient's exposure and mitigate transmission in our community.  Due to his co-morbid illnesses, this patient is at least at moderate risk for complications without adequate follow up.  This format is felt to be most appropriate for this patient at this time.  All issues noted in this document were discussed and addressed.  A limited physical exam was performed with this format.  Please refer to the patient's chart for his consent to telehealth for [facility].      Date:  05/22/2021   ID:  George Proctor, DOB 1956/03/13, MRN [id number] The patient was identified using 2 identifiers.  Evaluation Performed:  Follow-Up Visit  Patient Location:  [address] [place] [address]  Provider location:   [address] [place], [place] [address]  PCP:  [name], MD  Cardiologist:  Pixie Casino, MD Electrophysiologist:  None   Chief Complaint:  Follow-up dyslipidemia  History of Present Illness:    DEANDRE Proctor is a 65 y.o. male who presents via audio/video conferencing for a telehealth visit today.  Mr. Entrikin returns today for telehealth follow-up.  I had recommended more aggressive therapy for dyslipidemia.  Particularly, his cholesterol has been elevated up to 229, with triglycerides 191 and LDL 153 about 6 months ago.  I recommended atorvastatin however he never took the medication.  He was still having side effects from the chemotherapy for follicular lymphoma.  Subsequently he has completed his chemotherapy and had his port removed.  He says he is now in remission.  He has lost about 20 pounds.  He did see his PCP in August who had recommended low-dose rosuvastatin since he was not on a atorvastatin.  He said he took that for several weeks but had side effects  including fatigue similar to when he was on chemotherapy.  He decided to discontinue it.  Overall basically his cholesterol has come down, however mostly related to diet and weight loss.  Total cholesterol now 146, triglycerides 126, HDL 35 and LDL 88.  In addition to aortic atherosclerosis which was previously reported, a recent CT scan in May 2022 did show some coronary artery calcifications.  The patient does not have symptoms concerning for COVID-19 infection (fever, chills, cough, or new SHORTNESS OF BREATH).    Prior CV studies:   The following studies were reviewed today:  Chart reviewed, lab work  PMHx:  Past Medical History:  Diagnosis Date   Anxiety    Depression    Gait difficulty    Lymphoma ([place]) dx'd 05/2020   Memory loss     Past Surgical History:  Procedure Laterality Date   ABDOMINAL SURGERY     Childhood   BUNIONECTOMY     IR IMAGING GUIDED PORT INSERTION  06/30/2020   IR PARACENTESIS  06/26/2020   IR REMOVAL TUN ACCESS W/ PORT W/O FL MOD SED  05/17/2021   IR US GUIDE BX ASP/DRAIN  06/26/2020    FAMHx:  Family History  Problem Relation Age of Onset   Aneurysm Mother 68       Thoracic   Hypertension Father    Heart failure Father    Heart disease Father    Heart attack Paternal Grandfather    Depression Cousin    Bipolar disorder Cousin    Schizophrenia Maternal  Uncle     SOCHx:   reports that he has never smoked. He has never used smokeless tobacco. He reports that he does not drink alcohol and does not use drugs.  ALLERGIES:  Allergies  Allergen Reactions   Omeprazole Nausea And Vomiting    MEDS:  Current Meds  Medication Sig   ALPRAZolam (XANAX) 0.25 MG tablet Take 0.125-0.25 mg by mouth at bedtime as needed.   Ascorbic Acid (VITAMIN C) 1000 MG tablet Take 1,000 mg by mouth daily.   B Complex Vitamins (VITAMIN B COMPLEX PO) Take 1 tablet by mouth daily.    Multiple Vitamin (MULTIVITAMIN) capsule Take 1 capsule by mouth daily.    pantoprazole (PROTONIX) 40 MG tablet Take 40 mg by mouth 2 (two) times daily as needed (indigestion).    VITAMIN D PO Take by mouth.   zolpidem (AMBIEN CR) 12.5 MG CR tablet TAKE 1 TABLET BY MOUTH AT BEDTIME AS NEEDED FOR SLEEP.     ROS: Pertinent items noted in HPI and remainder of comprehensive ROS otherwise negative.  Labs/Other Tests and Data Reviewed:    Recent Labs: 06/25/2020: B Natriuretic Peptide 29.7 09/22/2020: Magnesium 2.1 04/20/2021: ALT 18; BUN 11; Creatinine 0.82; Hemoglobin 13.0; Platelets 162; Potassium 4.1; Sodium 143   Recent Lipid Panel Lab Results  Component Value Date/Time   CHOL 146 05/14/2021 08:38 AM   TRIG 126 05/14/2021 08:38 AM   HDL 35 (L) 05/14/2021 08:38 AM   CHOLHDL 4.2 05/14/2021 08:38 AM   CHOLHDL 5.2 CALC 11/03/2007 11:36 AM   LDLCALC 88 05/14/2021 08:38 AM    Wt Readings from Last 3 Encounters:  05/22/21 167 lb (75.8 kg)  04/20/21 167 lb 1.6 oz (75.8 kg)  02/01/21 171 lb 5 oz (77.7 kg)     Exam:    Vital Signs:  Ht '6\' 1"'$  (1.854 m)   Wt 167 lb (75.8 kg)   BMI 22.03 kg/m    General appearance: alert and no distress Lungs: No respiratory difficulty Abdomen: Scaphoid Extremities: extremities normal, atraumatic, no cyanosis or edema Skin: Pale Neurologic: Grossly normal Psych: Pleasant  ASSESSMENT & PLAN:    High grade follicular lymphoma -in remission status postchemotherapy Atypical chest pain-low risk Myoview 01/2020, normal LV function Aortic root dilatation-measuring 4.1-4.6 cm (01/2017) Aortic atherosclerosis/calcification, "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 Coronary artery calcification seen on nondedicated CT scan Mild to moderate aortic insufficiency Shortness of breath, fatigue Probable depression Orthostatic hypotension  Mr. Glinsky has had significant improvement in his cholesterol mostly due to weight loss in  the setting unfortunately of follicular lymphoma.  He is in remission at this point and recently had his port removed.  He cannot tolerate low-dose rosuvastatin because it caused symptoms similar to when he had chemotherapy.  He had never taken the atorvastatin that I prescribed.  We may need to consider that in the future however since he has had a good response to the weight loss and diet, I would just recommend continuing to eat healthy and gain his strength back.  We will reassess his cholesterol in about 6 months.  We could consider a lower dose of atorvastatin if we need some therapy or alternatives such as ezetimibe.  COVID-19 Education: The signs and symptoms of COVID-19 were discussed with the patient and how to seek care for testing (follow up with PCP or arrange E-visit).  The importance of social distancing was discussed today.  Patient Risk:   After full review of this patients clinical status, I feel that they are at least moderate risk at this time.  Time:   Today, I have spent 15 minutes with the patient with telehealth technology discussing dyslipidemia, coronary artery calcification, aortic atherosclerosis, statin intolerance.     Medication Adjustments/Labs and Tests Ordered: Current medicines are reviewed at length with the patient today.  Concerns regarding medicines are outlined above.   Tests Ordered: No orders of the defined types were placed in this encounter.   Medication Changes: No orders of the defined types were placed in this encounter.   Disposition:  in 6 month(s)  Pixie Casino, MD, [facility], [place] Director of the Advanced Lipid Disorders &  Cardiovascular Risk Reduction Clinic Diplomate of the American Board of Clinical Lipidology Attending Cardiologist  Direct Dial: [phone]  Fax: [phone]  Website:  [email or web address]  Pixie Casino, MD  05/22/2021 9:26 AM

## 2021-05-29 DIAGNOSIS — I700 Atherosclerosis of aorta: Secondary | ICD-10-CM | POA: Diagnosis not present

## 2021-06-17 DIAGNOSIS — G4733 Obstructive sleep apnea (adult) (pediatric): Secondary | ICD-10-CM | POA: Diagnosis not present

## 2021-06-28 ENCOUNTER — Ambulatory Visit: Payer: BC Managed Care – PPO | Admitting: Neurology

## 2021-06-28 ENCOUNTER — Encounter: Payer: Self-pay | Admitting: Neurology

## 2021-06-28 ENCOUNTER — Other Ambulatory Visit: Payer: Self-pay

## 2021-06-28 VITALS — BP 122/72 | HR 119 | Ht 73.0 in | Wt 171.8 lb

## 2021-06-28 DIAGNOSIS — G4733 Obstructive sleep apnea (adult) (pediatric): Secondary | ICD-10-CM | POA: Diagnosis not present

## 2021-06-28 DIAGNOSIS — Z789 Other specified health status: Secondary | ICD-10-CM | POA: Diagnosis not present

## 2021-06-28 NOTE — Patient Instructions (Signed)
It was nice to see you again today.  I am sorry that you have had difficulty tolerating the AutoPap machine.  You have been compliant but have not benefited from it and continue to struggle with it.  I appreciate you trying AutoPap as your initial treatment option for your overall mild sleep apnea.  As discussed, at this juncture, as you have mild obstructive sleep apnea, you can be considered for a dental device. I can refer you to a dentist for consideration for an oral appliance, you can call our office, if you find a dentist of your choosing, you can also get in touch with your own dentist first.   I will send a discontinuation (D/C) order to your DME company. I would not recommend surgical treatment for your overall mild sleep apnea.  Please follow-up with your primary care physician as scheduled and your psychiatrist.

## 2021-06-28 NOTE — Progress Notes (Signed)
Subjective:    Patient ID: George Proctor, male    DOB: 24-Jan-1956, 65 y.o.   MRN: [id number]  HPI   Interim history:   George Proctor is a 65 year old right-handed gentleman with an underlying medical history of lymphoma, aortic root dilatation, atypical chest pain, sinus tachycardia, anxiety, and depression, who presents for follow-up consultation of his sleep disturbance including mild obstructive sleep apnea after recent home sleep testing and starting AutoPap therapy.  The patient is unaccompanied today.  I first met him at the request of his primary care physician on 02/01/2021, at which time he reported chronic difficulty with sleep initiation and sleep maintenance as well as daytime somnolence.  He was advised to proceed with a sleep study.  He had a home sleep test on 03/12/2021 which indicated mild obstructive sleep apnea with an AHI of 6.7/h, O2 nadir 84%.  Time below or at 88% saturation was 3 minutes.  Mild to moderate intermittent snoring was noted.  He was advised to start a trial of AutoPap therapy.  His set up date was 05/18/2021.  He has an Presenter, broadcasting.  Today, 06/28/2021: I reviewed his AutoPap compliance data from 05/30/2021 through 06/28/2021, which is a total of 29 days, during which time he used his machine 27 out of 29 days with percent use days greater than 4 hours at 80%, indicating very good compliance, average usage for days on treatment of 6.4 hours, average AHI 7.9/h, average pressure for the 95th percentile at 9 cm with a range of 5 to 11 cm.  Leak acceptable with a 95th percentile at 9.3 L/min.  He reports not sleeping well with the AutoPap, he does not feel any improvement from it.  In fact, compared to before treatment with AutoPap therapy he feels worse.  He would like to discontinue treatment.  He had talked to his dentist in the distant past, he may have had an oral appliance but it may have been a occlusive guard for bruxism also.  He had trouble tolerating it  then.  He is struggling with depression.  He has seen a psychiatrist and has talked to his primary care physician about this.  He has trouble with focus and attention.  He is looking into the possibility of being treated with ketamine.  He has a psychiatrist that he has located who would see him virtually for this as I understand.  He is working out the logistics of this currently.   The patient's allergies, current medications, family history, past medical history, past social history, past surgical history and problem list were reviewed and updated as appropriate.   Previously:   02/01/21: (He) reports chronic difficulty with sleep initiation and sleep maintenance for years, also daytime somnolence.  He has tried prescription medicines including mirtazapine, which caused oversedation and Belsomra which caused similar side effects.  He feels tired during the day.  He is followed by psychiatry.  He is currently on alprazolam and zolpidem.  He has had chemotherapy for his lymphoma and is followed by oncology.  He has a prior history of sleep apnea but is currently not on CPAP.  I reviewed your office note from 12/19/2020.  Additional medication trials included clonazepam, guanfacine, trazodone, and he has tried multiple antidepressants including sertraline, citalopram, escitalopram, duloxetine, aripiprazole, and bupropion. His Epworth sleepiness score is 14 out of 24, fatigue severity score is 57 out of 63.  He had sleep study testing through [facility] in January 2004, a baseline sleep  study from 10/05/2002 showed a RDI of 3.4/h, average oxygen saturation was 93%, nadir was not listed.  He had a next-day MSLT on 10/06/2002 which showed a mean sleep latency of 3.5 minutes with no sleep onset REM periods.  Medication list was not provided at the time.     He was diagnosed with sleep apnea over 10 years ago and has a CPAP machine but has not used it in years.  He has had multiple sleep studies.  He also had a  sleep study through [facility] health.  He reports being diagnosed with sleep apnea through [facility] in [place].   He also saw Dr. Brett Fairy several years ago in this clinic and had a home sleep test which per his report was negative for sleep apnea.   You ordered blood work, he had blood testing on 12/20/2020 including TSH, testosterone free and total, B12, CMP and CBC with differential, we will request test results from your office.    He had seen Dr. Krista Blue in our office in 2016 for memory loss and coordination problems. An EEG in our office was normal at the time.   In the past, through psychiatry, he has tried different wake promoting medications including Provigil, Nuvigil and Concerta.  He is currently on no wake promoting medicine.  He sees a psychiatrist remotely, uses the alprazolam as needed and Ambien CR generic as needed.  He takes melatonin as needed.  Bedtime is generally around 9 PM and rise time between 4 and 5 AM, he is often awake around 4 AM and cannot go back to sleep.  He has nocturia about twice per average night.  He drinks caffeine in the form of tea and coffee, 2 large cups of coffee or mugs per day and 2 to 3 cups of tea per day, he is a non-smoker and drinks alcohol rarely.  He is married and lives with his wife, he does not watch TV in the bedroom but does use his tablet computer before falling asleep.  His weight has been fluctuating, when he was diagnosed with lymphoma he lost quite a bit of weight in the context of chemotherapy which completed about 3 months ago and he has gained about 10 pounds back.  His Past Medical History Is Significant For: Past Medical History:  Diagnosis Date   Anxiety    Depression    Gait difficulty    Lymphoma ([place]) dx'd 05/2020   Memory loss     His Past Surgical History Is Significant For: Past Surgical History:  Procedure Laterality Date   ABDOMINAL SURGERY     Childhood   BUNIONECTOMY     IR IMAGING GUIDED PORT  INSERTION  06/30/2020   IR PARACENTESIS  06/26/2020   IR REMOVAL TUN ACCESS W/ PORT W/O FL MOD SED  05/17/2021   IR US GUIDE BX ASP/DRAIN  06/26/2020    His Family History Is Significant For: Family History  Problem Relation Age of Onset   Aneurysm Mother 38       Thoracic   Hypertension Father    Heart failure Father    Heart disease Father    Schizophrenia Maternal Uncle    Heart attack Paternal Grandfather    Depression Cousin    Bipolar disorder Cousin    Sleep apnea Neg Hx     His Social History Is Significant For: Social History   Socioeconomic History   Marital status: Married    Spouse name: Not on file  Number of children: 0   Years of education: [employer]   Highest education level: Not on file  Occupational History   Occupation: Scientist, research (physical sciences)  Tobacco Use   Smoking status: Never   Smokeless tobacco: Never  Vaping Use   Vaping Use: Never used  Substance and Sexual Activity   Alcohol use: No    Alcohol/week: 0.0 standard drinks    Comment: Less than one drink per week.   Drug use: No   Sexual activity: Not Currently  Other Topics Concern   Not on file  Social History Narrative   Lives at home with wife.   Right-handed.   3 cups caffeine per day.   Social Determinants of Health   Financial Resource Strain: Not on file  Food Insecurity: Not on file  Transportation Needs: Not on file  Physical Activity: Not on file  Stress: Not on file  Social Connections: Not on file    His Allergies Are:  Allergies  Allergen Reactions   Crestor [Rosuvastatin] Other (See Comments)    Muscle/join pain, weakness   Omeprazole Nausea And Vomiting  :   His Current Medications Are:  Outpatient Encounter Medications as of 06/28/2021  Medication Sig   Ascorbic Acid (VITAMIN C) 1000 MG tablet Take 1,000 mg by mouth daily.   B Complex Vitamins (VITAMIN B COMPLEX PO) Take 1 tablet by mouth daily.    Multiple Vitamin (MULTIVITAMIN) capsule Take  1 capsule by mouth daily.   pantoprazole (PROTONIX) 40 MG tablet Take 40 mg by mouth 2 (two) times daily as needed (indigestion).    VITAMIN D PO Take by mouth.   zolpidem (AMBIEN CR) 12.5 MG CR tablet TAKE 1 TABLET BY MOUTH AT BEDTIME AS NEEDED FOR SLEEP.   ALPRAZolam (XANAX) 0.25 MG tablet Take 0.125-0.25 mg by mouth at bedtime as needed.   No facility-administered encounter medications on file as of 06/28/2021.  :  Review of Systems:  Out of a complete 14 point review of systems, all are reviewed and negative with the exception of these symptoms as listed below:   Review of Systems  Neurological:        Pt is here for follow up with CPAP. Pt states he does not like the CPAP and he feels worse after he has been wearing CPAP. Pt states he does get headaches at times in am and he is still fatigue throughout the day.      Objective:   Physical Exam  Physical Examination:   Vitals:   06/28/21 0928  BP: 122/72  Pulse: (!) 119    General Examination: The patient is a very pleasant 65 y.o. male in no acute distress. He appears well-developed and well-nourished and well groomed.   HEENT: Normocephalic, atraumatic, corrective eyeglasses in place.  Tracking well-preserved, face symmetric. Speech is clear with no dysarthria, or hypophonia or voice tremor.  No carotid bruits.  Chest: Clear to auscultation without wheezing, rhonchi or crackles noted.   Heart: S1+S2+0, regular and normal without murmurs, rubs or gallops noted.    Abdomen: Soft, non-tender and non-distended.   Extremities: There is no obvious edema in the distal lower extremities.    Skin: Warm and dry without trophic changes noted.    Musculoskeletal: exam reveals no obvious joint deformities.    Neurologically:  Mental status: The patient is awake, alert and oriented in all 4 spheres. His immediate and remote memory, attention, language skills and fund of knowledge are appropriate. There is no  evidence of aphasia,  agnosia, apraxia or anomia. Speech is clear with normal prosody and enunciation. Thought process is linear. Mood is normal and affect is blunted.  Cranial nerves II - XII are as described above under HEENT exam.  Motor exam: Normal bulk, strength and tone is noted. There is no tremor, fine motor skills and coordination: grossly intact.  Cerebellar testing: No dysmetria or intention tremor. There is no truncal or gait ataxia.  Sensory exam: intact to light touch in the upper and lower extremities.  Gait, station and balance: He stands easily. No veering to one side is noted. No leaning to one side is noted. Posture is age-appropriate and stance is narrow based. Gait shows normal stride length and normal pace. No problems turning are noted.    Assessment and Plan:  In summary, George Proctor is a 65 year old male with an underlying medical history of lymphoma, aortic root dilatation, atypical chest pain, sinus tachycardia, anxiety, and depression, who presents for follow-up consultation of his obstructive sleep apnea.  He had a prior diagnosis of sleep apnea but had not been on CPAP therapy for years.  He had undergone prior sleep studies.  We did a home sleep test on 03/12/2021 which indicated mild obstructive sleep apnea with an AHI of 6.7/h, O2 nadir of 84%.  He reports chronic difficulty initiating and maintaining sleep.  He was advised to start a trial of AutoPap therapy.  He has been compliant with AutoPap but has not been able to tolerate it.  He has not benefited from treatment and would like to discontinue treatment at this time.  We talked about treatment options for mild sleep apnea including a dental device.  I would discourage any surgical intervention.  He does not need to lose weight either.  He is encouraged to talk to his dentist about the possibility of using a dental device.  He will let us know if he needs a referral.  I can also place a referral to another dental practice.  At this  juncture, I will place a discontinuation order to his DME company.  He is advised that they should be in touch with him within the next few days.  If they do not call him, he can always call them back or call our office to discuss the next steps of giving the machine back.   He has chronic difficulty initiating and maintaining sleep.  He has tried multiple medications for attention and for sleep as well as for depression and anxiety.  He is followed by psychiatry.  He is looking into establishing with a psychiatrist that can prescribe ketamine.  He has looked into this treatment and would like to try it.  He is encouraged to follow-up with his primary care physician and his psychiatrist closely and work with them as far as referrals are concerned.  At this juncture, he does not need to follow-up with me in sleep clinic.  I answered all his questions today and he was in agreement with the plan. I spent 30 minutes in total face-to-face time and in reviewing records during pre-charting, more than 50% of which was spent in counseling and coordination of care, reviewing test results, reviewing medications and treatment regimen and/or in discussing or reviewing the diagnosis of OSA, PAP intolerance, the prognosis and treatment options. Pertinent laboratory and imaging test results that were available during this visit with the patient were reviewed by me and considered in my medical decision making (see chart for  details).

## 2021-07-10 ENCOUNTER — Ambulatory Visit: Payer: BC Managed Care – PPO | Admitting: Neurology

## 2021-07-23 ENCOUNTER — Inpatient Hospital Stay: Payer: BC Managed Care – PPO | Attending: Hematology

## 2021-07-23 ENCOUNTER — Other Ambulatory Visit: Payer: Self-pay

## 2021-07-23 ENCOUNTER — Inpatient Hospital Stay: Payer: BC Managed Care – PPO | Admitting: Hematology

## 2021-07-23 VITALS — BP 111/67 | HR 89 | Temp 98.1°F | Resp 16 | Ht 73.0 in | Wt 170.0 lb

## 2021-07-23 DIAGNOSIS — Z8572 Personal history of non-Hodgkin lymphomas: Secondary | ICD-10-CM | POA: Diagnosis not present

## 2021-07-23 DIAGNOSIS — C8248 Follicular lymphoma grade IIIb, lymph nodes of multiple sites: Secondary | ICD-10-CM | POA: Diagnosis not present

## 2021-07-23 DIAGNOSIS — C8598 Non-Hodgkin lymphoma, unspecified, lymph nodes of multiple sites: Secondary | ICD-10-CM

## 2021-07-23 LAB — CBC WITH DIFFERENTIAL/PLATELET
Abs Immature Granulocytes: 0.01 10*3/uL (ref 0.00–0.07)
Basophils Absolute: 0 10*3/uL (ref 0.0–0.1)
Basophils Relative: 1 %
Eosinophils Absolute: 0.1 10*3/uL (ref 0.0–0.5)
Eosinophils Relative: 2 %
HCT: 42.3 % (ref 39.0–52.0)
Hemoglobin: 14 g/dL (ref 13.0–17.0)
Immature Granulocytes: 0 %
Lymphocytes Relative: 33 %
Lymphs Abs: 1.2 10*3/uL (ref 0.7–4.0)
MCH: 29.8 pg (ref 26.0–34.0)
MCHC: 33.1 g/dL (ref 30.0–36.0)
MCV: 90 fL (ref 80.0–100.0)
Monocytes Absolute: 0.6 10*3/uL (ref 0.1–1.0)
Monocytes Relative: 18 %
Neutro Abs: 1.6 10*3/uL — ABNORMAL LOW (ref 1.7–7.7)
Neutrophils Relative %: 46 %
Platelets: 187 10*3/uL (ref 150–400)
RBC: 4.7 MIL/uL (ref 4.22–5.81)
RDW: 13.2 % (ref 11.5–15.5)
WBC: 3.5 10*3/uL — ABNORMAL LOW (ref 4.0–10.5)
nRBC: 0 % (ref 0.0–0.2)

## 2021-07-23 LAB — CMP (CANCER CENTER ONLY)
ALT: 18 U/L (ref 0–44)
AST: 17 U/L (ref 15–41)
Albumin: 4.3 g/dL (ref 3.5–5.0)
Alkaline Phosphatase: 79 U/L (ref 38–126)
Anion gap: 9 (ref 5–15)
BUN: 13 mg/dL (ref 8–23)
CO2: 26 mmol/L (ref 22–32)
Calcium: 8.8 mg/dL — ABNORMAL LOW (ref 8.9–10.3)
Chloride: 108 mmol/L (ref 98–111)
Creatinine: 1.01 mg/dL (ref 0.61–1.24)
GFR, Estimated: >60 mL/min (ref >60)
Glucose, Bld: 82 mg/dL (ref 70–99)
Potassium: 4.4 mmol/L (ref 3.5–5.1)
Sodium: 143 mmol/L (ref 135–145)
Total Bilirubin: 0.4 mg/dL (ref 0.3–1.2)
Total Protein: 6 g/dL — ABNORMAL LOW (ref 6.5–8.1)

## 2021-07-23 LAB — LACTATE DEHYDROGENASE: LDH: 159 U/L (ref 98–192)

## 2021-07-23 NOTE — Progress Notes (Signed)
HEMATOLOGY/ONCOLOGY CLINIC NOTE  Date of Service: .07/23/2021   Patient Care Team: [name], MD as PCP - General (Internal Medicine) Debara Pickett Nadean Corwin, MD as PCP - Cardiology (Cardiology) [name], MD (Internal Medicine)  CHIEF COMPLAINTS/PURPOSE OF CONSULTATION:  Mx of high grade follicular lymphoma  HISTORY OF PRESENTING ILLNESS:   George Proctor is a wonderful 65 y.o. male who has been referred to [place] by Dr. Shelia Media for evaluation and management of possible wide-spread lymphoma - palpable mass in abdomen. Pt is accompanied today by his wife. The pt reports that he is doing well overall.   The pt reports that about three weeks ago he began experiencing abdominal fullness. Two weeks ago he began to feel abdominal pain. Lying down makes this discomfort worse. He has not had a restful sleep in two weeks. The abdominal fullness has caused lack of appetite, belching, and one episode of nausea and vomiting. He is currently eating 1/3-1/2 of his baseline. Pt has been taking Tramadol to control his pain, as well as a stool softener. His bowel movements look different, but he denies any constipation. He is hot natured and has mild night sweats, but denies drenching night sweats. Pt feels that he has been short of breath for awhile, but that this may have worsened lately. Pt follows with Dr. Benson Norway, GI.   He had abdominal surgery as a child after an accident. Pt has had GI bleeding and ulcers from taking NSAIDS. Pt has had several instances of abdominal swelling that were triggered by illness. Pt had esophageal dialation. His strictures were thought to be caused by acid reflux. He also has an umbilical hernia. He has chronic back pain. Pt was diagnosed with chronic lyme disease by a [place]. He has a history of balance issues and has brought this up to his PCP, who did not feel that it required a work up. He does not have a true allergy, but experiences nausea and vomiting when  taking Omeprazole. Pt denies any international travel in the last year.   Pt has been taking weekly Testosterone for 3-4 months. He was experiencing fatigue prior to beginning the hormonal injections. Pt was placed on Latuda & Lamictil several weeks ago for his Depression and Anxiety, but discontinued a week ago as he did not see any improvement in mood. He is not currently following with a mental health professional.   Dr. Shelia Media referred pt to Dr. Redmond Pulling at [facility] for biopsy consideration. Pt is scheduled to see Dr. Shelia Media next Wednesday.   Of note prior to the patient's visit today, pt has had  CT Abd/Pel completed on 06/05/2020 with results revealing numerous cardiophrenic, retroperitoneal and mesenteric adenopathy most consistent with lymphoma. Small volume of ascites. Hepatosplenomegaly.  Most recent lab results (03/22/2020) of CBC is as follows: all values are WNL except for WBC at 3.5K, PLT at 131K, Lymphs Rel at 17.6, Total Protein at 6.3.  On review of systems, pt reports worsening SOB, abdominal pain, abdominal fullness, low appetite, dyspepsia, mild night sweats and denies fevers, chills, rash, headaches, leg swelling, urinary habit changes, constipation, testicular pain/swelling and any other symptoms.   On PMHx the pt reports Depression, Anxiety, Abdominal Surgery, Gait difficulty, Esophageal dilation. On Family Hx the pt reports that his father and paternal aunt had polymyalgia rheumatica. His paternal aunt also had breast cancer.  INTERVAL HISTORY:  George Proctor is a wonderful 65 y.o. male who is here for evaluation and management of  lymphoma. The patient's last visit with [place] was on 04/20/2021. Pt is accompanied today by his wife.  Patient notes he has been doing well and has no acute new concerns.  He has been eating well and has gained about 3 to 4 pounds since his last clinic visit. No fevers no chills no night sweats.  No new lumps or bumps.  No abdominal pain  or distention.  No chest pain or shortness of breath. Lab results today 07/23/2021 of CBC w/diff unremarkable CMP within normal limits LDH within normal limits at 159  Patient appears to be emotionally doing better as well. Discussed healthy living and wellness interventions.  MEDICAL HISTORY:  Past Medical History:  Diagnosis Date   Anxiety    Depression    Gait difficulty    Lymphoma ([place]) dx'd 05/2020   Memory loss    SURGICAL HISTORY: Past Surgical History:  Procedure Laterality Date   ABDOMINAL SURGERY     Childhood   BUNIONECTOMY     IR IMAGING GUIDED PORT INSERTION  06/30/2020   IR PARACENTESIS  06/26/2020   IR REMOVAL TUN ACCESS W/ PORT W/O FL MOD SED  05/17/2021   IR US GUIDE BX ASP/DRAIN  06/26/2020    SOCIAL HISTORY: Social History   Socioeconomic History   Marital status: Married    Spouse name: Not on file   Number of children: 0   Years of education: [employer]   Highest education level: Not on file  Occupational History   Occupation: Scientist, research (physical sciences)  Tobacco Use   Smoking status: Never   Smokeless tobacco: Never  Vaping Use   Vaping Use: Never used  Substance and Sexual Activity   Alcohol use: No    Alcohol/week: 0.0 standard drinks    Comment: Less than one drink per week.   Drug use: No   Sexual activity: Not Currently  Other Topics Concern   Not on file  Social History Narrative   Lives at home with wife.   Right-handed.   3 cups caffeine per day.   Social Determinants of Health   Financial Resource Strain: Not on file  Food Insecurity: Not on file  Transportation Needs: Not on file  Physical Activity: Not on file  Stress: Not on file  Social Connections: Not on file  Intimate Partner Violence: Not on file    FAMILY HISTORY: Family History  Problem Relation Age of Onset   Aneurysm Mother 24       Thoracic   Hypertension Father    Heart failure Father    Heart disease Father    Schizophrenia Maternal Uncle     Heart attack Paternal Grandfather    Depression Cousin    Bipolar disorder Cousin    Sleep apnea Neg Hx     ALLERGIES:  is allergic to crestor [rosuvastatin] and omeprazole.  MEDICATIONS:  Current Outpatient Medications  Medication Sig Dispense Refill   ALPRAZolam (XANAX) 0.25 MG tablet Take 0.125-0.25 mg by mouth at bedtime as needed.     Ascorbic Acid (VITAMIN C) 1000 MG tablet Take 1,000 mg by mouth daily.     B Complex Vitamins (VITAMIN B COMPLEX PO) Take 1 tablet by mouth daily.      Multiple Vitamin (MULTIVITAMIN) capsule Take 1 capsule by mouth daily.     pantoprazole (PROTONIX) 40 MG tablet Take 40 mg by mouth 2 (two) times daily as needed (indigestion).      VITAMIN D PO Take by mouth.  zolpidem (AMBIEN CR) 12.5 MG CR tablet TAKE 1 TABLET BY MOUTH AT BEDTIME AS NEEDED FOR SLEEP. 30 tablet 0   No current facility-administered medications for this visit.    REVIEW OF SYSTEMS:   .10 Point review of Systems was done is negative except as noted above.   PHYSICAL EXAMINATION: ECOG PERFORMANCE STATUS: 2 - Symptomatic, <50% confined to bed  . Vitals:   07/23/21 0901  BP: 111/67  Pulse: 89  Resp: 16  Temp: 98.1 F (36.7 C)  SpO2: 100%    Filed Weights   07/23/21 0901  Weight: 170 lb (77.1 kg)    .Body mass index is 22.43 kg/m.  Marland Kitchen GENERAL:alert, in no acute distress and comfortable SKIN: no acute rashes, no significant lesions EYES: conjunctiva are pink and non-injected, sclera anicteric OROPHARYNX: MMM, no exudates, no oropharyngeal erythema or ulceration NECK: supple, no JVD LYMPH:  no palpable lymphadenopathy in the cervical, axillary or inguinal regions LUNGS: clear to auscultation b/l with normal respiratory effort HEART: regular rate & rhythm ABDOMEN:  normoactive bowel sounds , non tender, not distended. Extremity: no pedal edema PSYCH: alert & oriented x 3 with fluent speech NEURO: no focal motor/sensory deficits   LABORATORY DATA:  I have  reviewed the data as listed  . CBC Latest Ref Rng & Units 07/23/2021 04/20/2021 01/05/2021  WBC 4.0 - 10.5 K/uL 3.5(L) 3.5(L) 2.9(L)  Hemoglobin 13.0 - 17.0 g/dL 14.0 13.0 13.8  Hematocrit 39.0 - 52.0 % 42.3 39.1 41.8  Platelets 150 - 400 K/uL 187 162 164    . CMP Latest Ref Rng & Units 07/23/2021 04/20/2021 01/05/2021  Glucose 70 - 99 mg/dL 82 88 84  BUN 8 - 23 mg/dL 13 11 15   Creatinine 0.61 - 1.24 mg/dL 1.01 0.82 0.81  Sodium 135 - 145 mmol/L 143 143 141  Potassium 3.5 - 5.1 mmol/L 4.4 4.1 4.2  Chloride 98 - 111 mmol/L 108 107 106  CO2 22 - 32 mmol/L 26 26 27   Calcium 8.9 - 10.3 mg/dL 8.8(L) 9.3 9.2  Total Protein 6.5 - 8.1 g/dL 6.0(L) 6.1(L) 6.2(L)  Total Bilirubin 0.3 - 1.2 mg/dL 0.4 0.5 0.4  Alkaline Phos 38 - 126 U/L 79 78 75  AST 15 - 41 U/L 17 17 22   ALT 0 - 44 U/L 18 18 25    . Lab Results  Component Value Date   LDH 159 07/23/2021    RADIOGRAPHIC STUDIES: I have personally reviewed the radiological images as listed and agreed with the findings in the report. No results found.      ASSESSMENT & PLAN:   65 yo with   1) Stage III/IV high grade follicular lymphoma S/p 6 cycles of R-CHOP with CR  PLAN: -Discussed pt labwork today, 07/23/2021; cbc/diff and cmp -- WNL and unremarkable. -LDH wnl -Patient has no clinical or lab evidence of recurrence/progression of his high-grade follicular lymphoma -Port-A-Cath has been removed since his last visit -Discussed health and wellness interventions -recommend pt eat a healthy diet, walk 20-30 min daily, and drink 48-64 oz.  FOLLOW UP: Return to clinic with Dr. [name] with labs in 4 months   The total time spent in the appointment was 20 minutes and more than 50% was on counseling and direct patient cares.   All of the patient's questions were answered with apparent satisfaction. The patient knows to call the clinic with any problems, questions or concerns.    Sullivan Lone MD [place] AAHIVMS [facility] Hematology/Oncology  Physician [place]

## 2021-07-24 ENCOUNTER — Telehealth: Payer: Self-pay | Admitting: Hematology

## 2021-07-24 NOTE — Telephone Encounter (Signed)
Scheduled follow-up appointment per 11/7 los. Patient is aware.

## 2021-10-05 IMAGING — CT CT CHEST-ABD-PELV W/ CM
2 of 5 series · 12 of 36 positions shown, 14 images · IV contrast (omnipaque)
Comparison: June 24, 2020, CT of the chest and abdomen and pelvis
study also from June 24, 2020

CLINICAL DATA: Hematologic malignancy, history of chemo and
immunotherapy in this 64-year-old male

EXAM:
CT CHEST, ABDOMEN, AND PELVIS WITH CONTRAST
TECHNIQUE: Multidetector CT imaging of the chest, abdomen and pelvis was
performed following the standard protocol during bolus
administration of intravenous contrast.
CONTRAST:  100mL OMNIPAQUE IOHEXOL 300 MG/ML  SOLN

[Series 2: cap with · axial · 0.83mm/px · z∈[-520,+40]mm · 9 of 141 slices shown, 11 images]
[im 15/141  mediastinal]
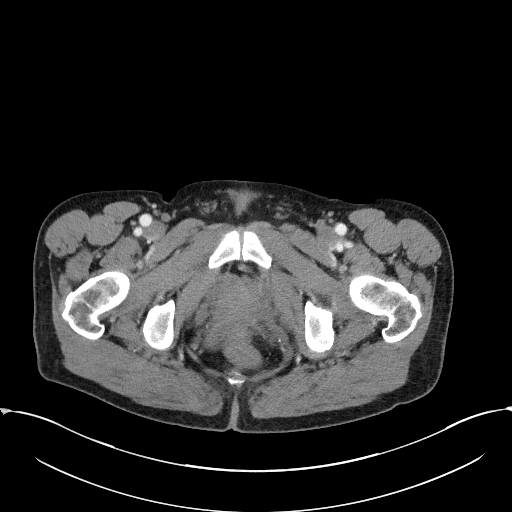
[im 15/141  bone]
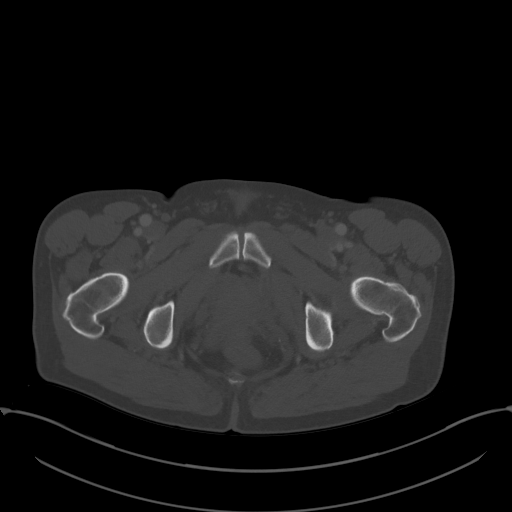
[im 29/141  mediastinal]
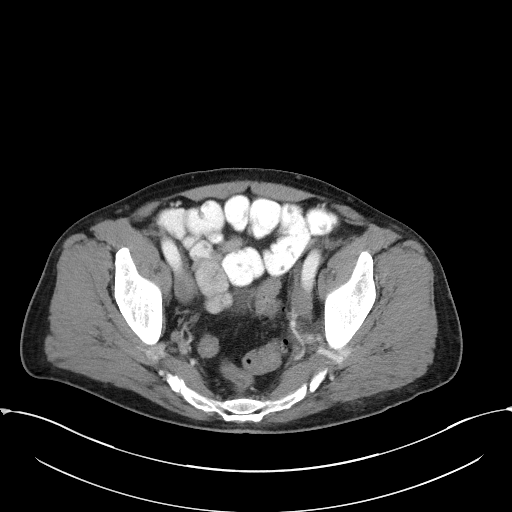
[im 43/141  mediastinal]
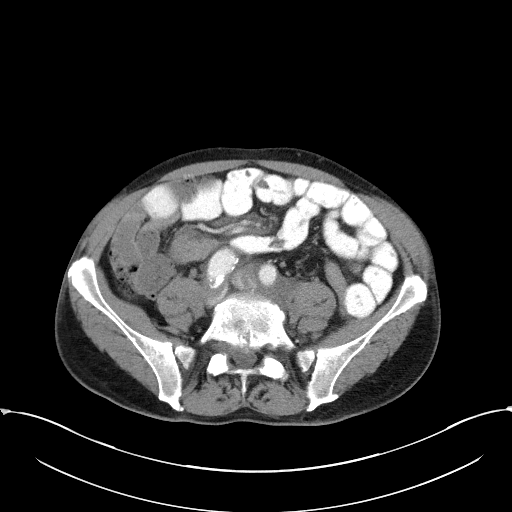
[im 57/141  mediastinal]
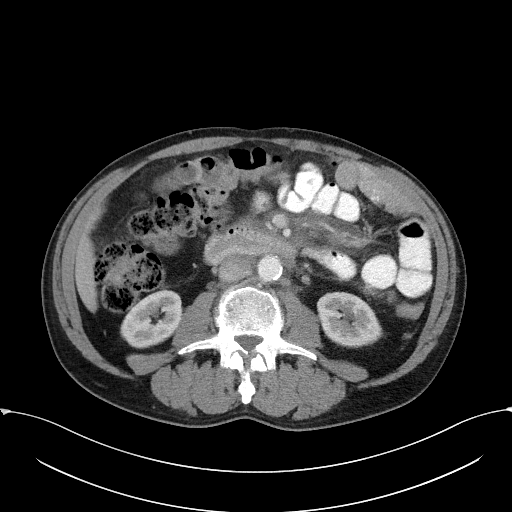
[im 71/141  mediastinal]
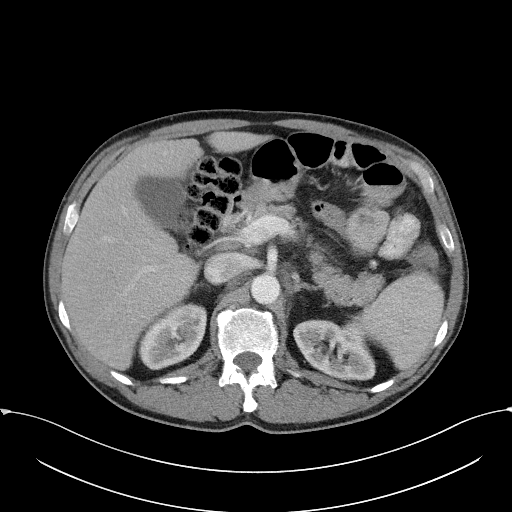
[im 85/141  mediastinal]
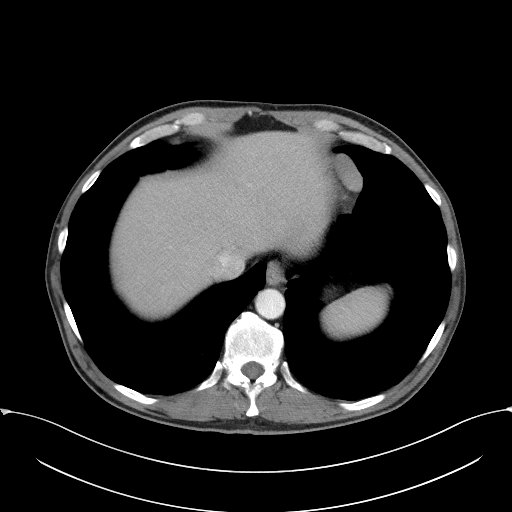
[im 99/141  mediastinal]
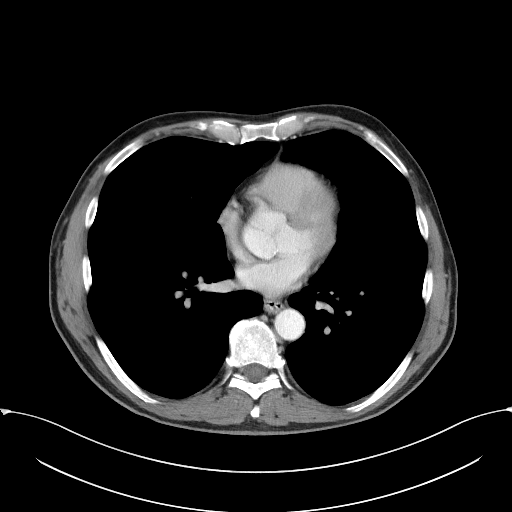
[im 113/141  mediastinal]
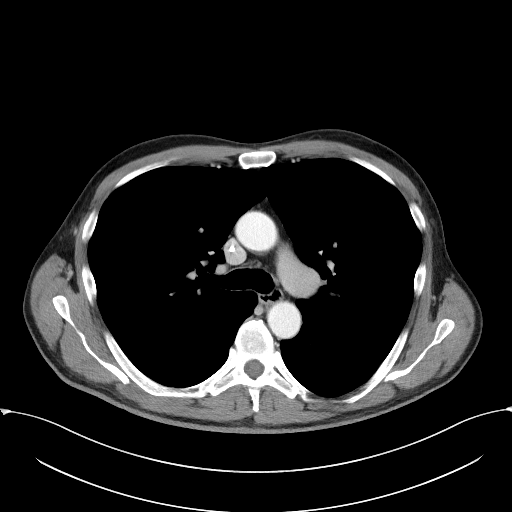
[im 127/141  mediastinal]
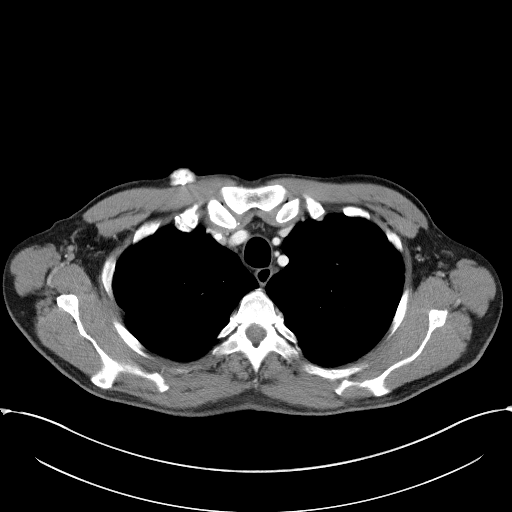
[im 127/141  bone]
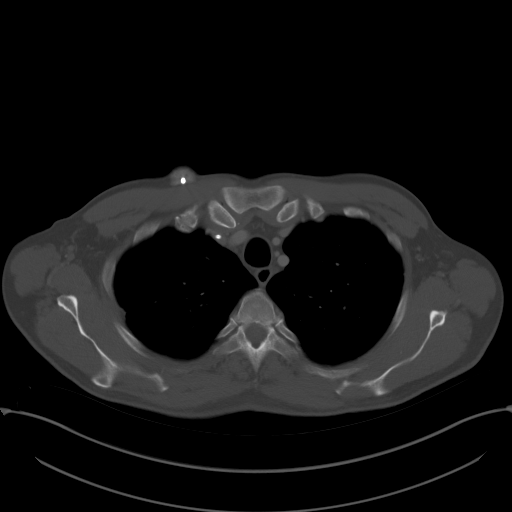

[Series 5: coronals · coronal · 1.07mm/px · 3 of 137 slices shown]
[im 28/137  mediastinal]
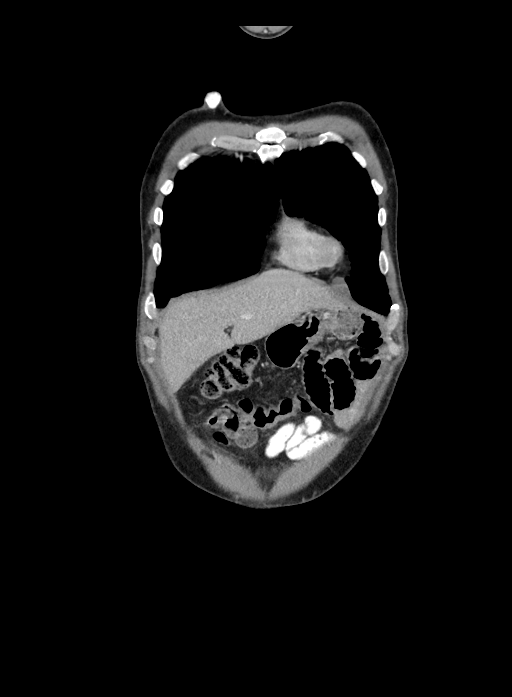
[im 55/137  mediastinal]
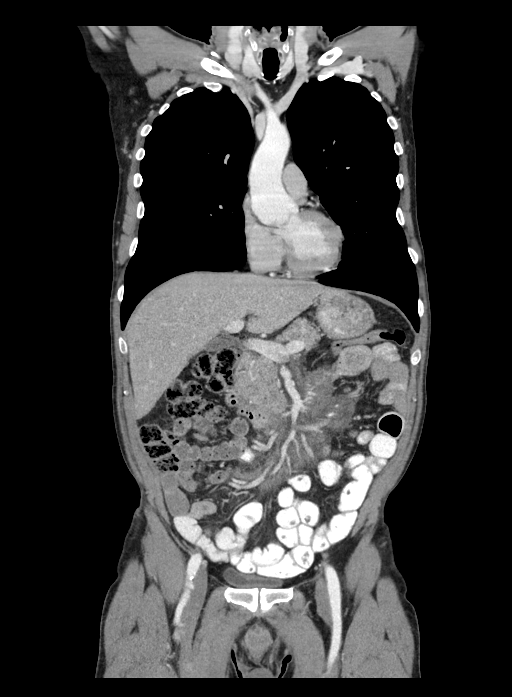
[im 82/137  mediastinal]
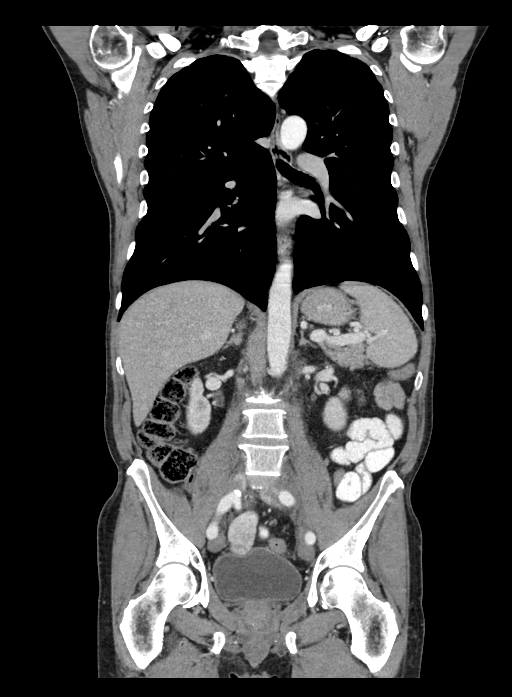

[12 of 36 positions shown; findings below may reference images not displayed]

FINDINGS: CT CHEST FINDINGS

Cardiovascular: RIGHT-sided Port-A-Cath in situ terminating at the
caval to atrial junction. Calcified and noncalcified plaque, mild
throughout the thoracic aorta. Ascending thoracic aorta at 3.7 cm.
Central pulmonary vasculature normal caliber, limited assessment on
venous phase evaluation.

Mediastinum/Nodes: Esophagus grossly normal. No mediastinal,
thoracic inlet, axillary or hilar lymphadenopathy. No anterior
mediastinal soft tissue.

Lungs/Pleura: Mild biapical scarring. No effusion with resolution of
moderate effusions seen on the previous exam. No consolidative
changes. Airways are patent.

Musculoskeletal: See below for full musculoskeletal details. No
acute or destructive bone process about the bony thorax.

CT ABDOMEN PELVIS FINDINGS

Hepatobiliary: No focal, suspicious hepatic lesion. No
pericholecystic stranding. No biliary duct dilation.

Pancreas: Pancreas without focal lesion or ductal dilation.
Peripancreatic stranding replaces abundant peripancreatic soft
tissue seen on the previous exam. Stranding relates to post
treatment changes. Stranding is also seen tracking into the small
bowel mesentery beneath the pancreas at the site of abundant soft
tissue seen on the previous exam, see below.

Spleen: Normal size spleen. No focal splenic lesion or perisplenic
soft tissue.

Adrenals/Urinary Tract: Adrenal glands are normal. Symmetric renal
enhancement. No hydronephrosis. Urinary bladder with smooth
contours.

Stomach/Bowel: Gastrointestinal tract without acute process. Small
bowel mesentery with soft tissue as described. Positive contrast
media partially fills the small bowel.

No bowel obstruction.  Appendix is normal.

Vascular/Lymphatic: Calcified and noncalcified atheromatous plaque
in the abdominal aorta as before. Aneurysmal dilation of the RIGHT
common iliac artery to 2.0 cm with stable appearance. Stable focal,
chronic non flow limiting dissection in the superior mesenteric
artery with associated mild dilation (image 74, series 2).

Soft tissue in the retroperitoneum at the site of bulky
retroperitoneal soft tissue on the prior study markedly improved
when compared to the previous exam. Scattered small lymph nodes
amidst confluent soft tissue that is markedly diminished.

(Image 81, series 2) previously there was bulky soft tissue
surrounding the aorta it measured approximately 11 x 4.7 cm greatest
axial dimension. This is below the level of the renal veins. 2.1 cm
greatest AP dimension noted in the LEFT periaortic chain
representing 2 nearly discrete lymph nodes in this location.
Intra-aortocaval lymph nodes measuring 11 mm. The soft tissue is no
longer confluent at this level.

At the iliac bifurcation confluent stranding is noted without
discrete lymph nodes. Along the LEFT common iliac region there is
1.6 x 1.5 cm stranding (image 94, series 2) previously this area
measuring approximately 4.7 x 4.4 cm.

At the root of the small bowel mesentery (image 83, series 2) less
confluent soft tissue as well with stranding in the mesentery still
with some soft tissue characteristics but much less dense than on
the prior study 8.4 x 2.8 cm, at this site previously soft tissue
measuring as much as 15 x 11 cm. This stranding tracks throughout
jejunal mesenteric reflections more so than other areas and is
globally diminished.

Pelvic nodal disease with even more pronounced decrease. 8 mm to 9
mm rind of soft tissue along LEFT common iliac and external iliac
branches at the site of bulky nodal disease that measured as much as
3.5 cm short axis dimension at this location on the prior exam

No new discrete lymph nodes or worsening soft tissue in the abdomen
or pelvis.

Reproductive: Unremarkable

Other: No ascites, resolution of ascites seen on the previous
study. Small fat containing umbilical hernia.

Musculoskeletal: No destructive bone process. Spinal degenerative
changes.
IMPRESSION: 1. Marked interval response to therapy now with confluent stranding
at the site of bulky soft tissue seen in the abdomen and pelvis on
the prior study also with resolution of ascites and pleural
effusions.
2. Small discrete lymph nodes CT now be visualized in the
retroperitoneum. No new signs of nodal disease. Attention on
follow-up.
3. Resolution of disease in the chest including pleural effusion
mentioned above.
4. Aortic atherosclerosis. Signs of chronic non flow limiting focal
dissection of the SMA without change since 7539 and with evidence of
aneurysmal dilation of the RIGHT common iliac artery not changed
since the study June 24, 2020 and slightly increased since the
previous exam from [DATE] cm as compared to 1.8 cm).

Aortic Atherosclerosis (3WLGT-SXS.S).

## 2021-11-19 ENCOUNTER — Other Ambulatory Visit: Payer: Self-pay

## 2021-11-19 ENCOUNTER — Telehealth: Payer: Self-pay | Admitting: Hematology

## 2021-11-19 ENCOUNTER — Inpatient Hospital Stay: Payer: 59 | Attending: Hematology | Admitting: Hematology

## 2021-11-19 ENCOUNTER — Inpatient Hospital Stay: Payer: 59

## 2021-11-19 VITALS — BP 117/81 | HR 101 | Temp 98.9°F | Resp 20 | Wt 171.0 lb

## 2021-11-19 DIAGNOSIS — F419 Anxiety disorder, unspecified: Secondary | ICD-10-CM

## 2021-11-19 DIAGNOSIS — Z8572 Personal history of non-Hodgkin lymphomas: Secondary | ICD-10-CM | POA: Diagnosis not present

## 2021-11-19 DIAGNOSIS — C8248 Follicular lymphoma grade IIIb, lymph nodes of multiple sites: Secondary | ICD-10-CM

## 2021-11-19 LAB — CMP (CANCER CENTER ONLY)
ALT: 21 U/L (ref 0–44)
AST: 18 U/L (ref 15–41)
Albumin: 4.4 g/dL (ref 3.5–5.0)
Alkaline Phosphatase: 69 U/L (ref 38–126)
Anion gap: 5 (ref 5–15)
BUN: 13 mg/dL (ref 8–23)
CO2: 28 mmol/L (ref 22–32)
Calcium: 9.4 mg/dL (ref 8.9–10.3)
Chloride: 108 mmol/L (ref 98–111)
Creatinine: 0.91 mg/dL (ref 0.61–1.24)
GFR, Estimated: >60 mL/min (ref >60)
Glucose, Bld: 91 mg/dL (ref 70–99)
Potassium: 4.2 mmol/L (ref 3.5–5.1)
Sodium: 141 mmol/L (ref 135–145)
Total Bilirubin: 0.5 mg/dL (ref 0.3–1.2)
Total Protein: 6.3 g/dL — ABNORMAL LOW (ref 6.5–8.1)

## 2021-11-19 LAB — CBC WITH DIFFERENTIAL/PLATELET
Abs Immature Granulocytes: 0.01 10*3/uL (ref 0.00–0.07)
Basophils Absolute: 0 10*3/uL (ref 0.0–0.1)
Basophils Relative: 1 %
Eosinophils Absolute: 0.1 10*3/uL (ref 0.0–0.5)
Eosinophils Relative: 2 %
HCT: 42.2 % (ref 39.0–52.0)
Hemoglobin: 14 g/dL (ref 13.0–17.0)
Immature Granulocytes: 0 %
Lymphocytes Relative: 16 %
Lymphs Abs: 0.9 10*3/uL (ref 0.7–4.0)
MCH: 29.8 pg (ref 26.0–34.0)
MCHC: 33.2 g/dL (ref 30.0–36.0)
MCV: 89.8 fL (ref 80.0–100.0)
Monocytes Absolute: 0.5 10*3/uL (ref 0.1–1.0)
Monocytes Relative: 9 %
Neutro Abs: 3.8 10*3/uL (ref 1.7–7.7)
Neutrophils Relative %: 72 %
Platelets: 179 10*3/uL (ref 150–400)
RBC: 4.7 MIL/uL (ref 4.22–5.81)
RDW: 13.5 % (ref 11.5–15.5)
WBC: 5.3 10*3/uL (ref 4.0–10.5)
nRBC: 0 % (ref 0.0–0.2)

## 2021-11-19 LAB — LACTATE DEHYDROGENASE: LDH: 135 U/L (ref 98–192)

## 2021-11-19 NOTE — Telephone Encounter (Signed)
Scheduled follow-up appointment per 3/6 los. Patient is aware. ?

## 2021-11-19 NOTE — Progress Notes (Signed)
? ?HEMATOLOGY/ONCOLOGY CLINIC NOTE ? ?Date of Service: .11/19/2021 ? ? ?Patient Care Team: ?[name], MD as PCP - General (Internal Medicine) ?Pixie Casino, MD as PCP - Cardiology (Cardiology) ?[name], MD (Internal Medicine) ? ?CHIEF COMPLAINTS/PURPOSE OF CONSULTATION:  ?Continued evaluation and management of high-grade follicular lymphoma ? ?HISTORY OF PRESENTING ILLNESS:  ?Please see previous note for details of initial presentation ? ?INTERVAL HISTORY: ? ?George Proctor is a wonderful 66 y.o. male who is here for continued evaluation and management of his high-grade follicular lymphoma.  He is accompanied by his wife for this clinic visit. ?He notes no acute new symptoms since his last clinic visit. ?No fevers no chills no night sweats no unexpected weight loss. ?Still having issues with anxiety and some depression and notes that he is no longer following with his previous counselor would like a new referral.  With his consent he was referred to Dr. Elias Else. ? ?Labs done today were reviewed with the patient ? ?MEDICAL HISTORY:  ?Past Medical History:  ?Diagnosis Date  ? Anxiety   ? Depression   ? Gait difficulty   ? Lymphoma ([place]) dx'd 05/2020  ? Memory loss   ? ?SURGICAL HISTORY: ?Past Surgical History:  ?Procedure Laterality Date  ? ABDOMINAL SURGERY    ? Childhood  ? BUNIONECTOMY    ? IR IMAGING GUIDED PORT INSERTION  06/30/2020  ? IR PARACENTESIS  06/26/2020  ? IR REMOVAL TUN ACCESS W/ PORT W/O FL MOD SED  05/17/2021  ? IR US GUIDE BX ASP/DRAIN  06/26/2020  ? ? ?SOCIAL HISTORY: ?Social History  ? ?Socioeconomic History  ? Marital status: Married  ?  Spouse name: Not on file  ? Number of children: 0  ? Years of education: [employer]  ? Highest education level: Not on file  ?Occupational History  ? Occupation: Scientist, research (physical sciences)  ?Tobacco Use  ? Smoking status: Never  ? Smokeless tobacco: Never  ?Vaping Use  ? Vaping Use: Never used  ?Substance and Sexual Activity  ? Alcohol  use: No  ?  Alcohol/week: 0.0 standard drinks  ?  Comment: Less than one drink per week.  ? Drug use: No  ? Sexual activity: Not Currently  ?Other Topics Concern  ? Not on file  ?Social History Narrative  ? Lives at home with wife.  ? Right-handed.  ? 3 cups caffeine per day.  ? ?Social Determinants of Health  ? ?Financial Resource Strain: Not on file  ?Food Insecurity: Not on file  ?Transportation Needs: Not on file  ?Physical Activity: Not on file  ?Stress: Not on file  ?Social Connections: Not on file  ?Intimate Partner Violence: Not on file  ? ? ?FAMILY HISTORY: ?Family History  ?Problem Relation Age of Onset  ? Aneurysm Mother 60  ?     Thoracic  ? Hypertension Father   ? Heart failure Father   ? Heart disease Father   ? Schizophrenia Maternal Uncle   ? Heart attack Paternal Grandfather   ? Depression Cousin   ? Bipolar disorder Cousin   ? Sleep apnea Neg Hx   ? ? ?ALLERGIES:  is allergic to crestor [rosuvastatin] and omeprazole. ? ?MEDICATIONS:  ?Current Outpatient Medications  ?Medication Sig Dispense Refill  ? ALPRAZolam (XANAX) 0.25 MG tablet Take 0.125-0.25 mg by mouth at bedtime as needed.    ? Ascorbic Acid (VITAMIN C) 1000 MG tablet Take 1,000 mg by mouth daily.    ? B Complex Vitamins (VITAMIN  B COMPLEX PO) Take 1 tablet by mouth daily.     ? Multiple Vitamin (MULTIVITAMIN) capsule Take 1 capsule by mouth daily.    ? pantoprazole (PROTONIX) 40 MG tablet Take 40 mg by mouth 2 (two) times daily as needed (indigestion).     ? VITAMIN D PO Take by mouth.    ? zolpidem (AMBIEN CR) 12.5 MG CR tablet TAKE 1 TABLET BY MOUTH AT BEDTIME AS NEEDED FOR SLEEP. 30 tablet 0  ? ?No current facility-administered medications for this visit.  ? ? ?REVIEW OF SYSTEMS:   ?10 Point review of Systems was done is negative except as noted above. ? ?PHYSICAL EXAMINATION: ?ECOG PERFORMANCE STATUS: 2 - Symptomatic, <50% confined to bed ? ?. ?Vitals:  ? 11/19/21 0900  ?BP: 117/81  ?Pulse: (!) 101  ?Resp: 20  ?Temp: 98.9 ?F (37.2  ?C)  ?SpO2: 98%  ? ? ?Filed Weights  ? 11/19/21 0900  ?Weight: 171 lb (77.6 kg)  ? ?. ?Wt Readings from Last 3 Encounters:  ?11/19/21 171 lb (77.6 kg)  ?07/23/21 170 lb (77.1 kg)  ?06/28/21 171 lb 12.8 oz (77.9 kg)  ? ? ?.Body mass index is 22.56 kg/m?Marland Kitchen ?NAD ?GENERAL:alert, in no acute distress and comfortable ?SKIN: no acute rashes, no significant lesions ?EYES: conjunctiva are pink and non-injected, sclera anicteric ?OROPHARYNX: MMM, no exudates, no oropharyngeal erythema or ulceration ?NECK: supple, no JVD ?LYMPH:  no palpable lymphadenopathy in the cervical, axillary or inguinal regions ?LUNGS: clear to auscultation b/l with normal respiratory effort ?HEART: regular rate & rhythm ?ABDOMEN:  normoactive bowel sounds , non tender, not distended. ?Extremity: no pedal edema ?PSYCH: alert & oriented x 3 with fluent speech ?NEURO: no focal motor/sensory deficits ? ? ?LABORATORY DATA:  ?I have reviewed the data as listed ? ?. ?CBC Latest Ref Rng & Units 11/19/2021 07/23/2021 04/20/2021  ?WBC 4.0 - 10.5 K/uL 5.3 3.5(L) 3.5(L)  ?Hemoglobin 13.0 - 17.0 g/dL 14.0 14.0 13.0  ?Hematocrit 39.0 - 52.0 % 42.2 42.3 39.1  ?Platelets 150 - 400 K/uL 179 187 162  ? ? ?. ?CMP Latest Ref Rng & Units 11/19/2021 07/23/2021 04/20/2021  ?Glucose 70 - 99 mg/dL 91 82 88  ?BUN 8 - 23 mg/dL '13 13 11  '$ ?Creatinine 0.61 - 1.24 mg/dL 0.91 1.01 0.82  ?Sodium 135 - 145 mmol/L 141 143 143  ?Potassium 3.5 - 5.1 mmol/L 4.2 4.4 4.1  ?Chloride 98 - 111 mmol/L 108 108 107  ?CO2 22 - 32 mmol/L '28 26 26  '$ ?Calcium 8.9 - 10.3 mg/dL 9.4 8.8(L) 9.3  ?Total Protein 6.5 - 8.1 g/dL 6.3(L) 6.0(L) 6.1(L)  ?Total Bilirubin 0.3 - 1.2 mg/dL 0.5 0.4 0.5  ?Alkaline Phos 38 - 126 U/L 69 79 78  ?AST 15 - 41 U/L '18 17 17  '$ ?ALT 0 - 44 U/L '21 18 18  '$ ? ?. ?Lab Results  ?Component Value Date  ? LDH 135 11/19/2021  ? ? ?RADIOGRAPHIC STUDIES: ?I have personally reviewed the radiological images as listed and agreed with the findings in the report. ?No results found. ?  ? ? ? ?ASSESSMENT &  PLAN:  ? ?66 yo with  ? ?1) Stage III/IV high grade follicular lymphoma-in complete remission ?S/p 6 cycles of R-CHOP with CR ? ?PLAN: ?-Labs done today were discussed with the patient in detail. ?CBC within normal limits with hemoglobin of 14, WBC count of 5.3k and platelets of 179k ?CMP unremarkable  ?LDH within normal limits at 135 ?Patient has no clinical or lab  evidence of lymphoma recurrence/progression at this time. ?-Discussed need for ongoing wellness interventions including healthy diet, regular physical activity and distressing. ?-We discussed getting repeat CT chest abdomen pelvis and about 22 weeks prior to his clinic follow-up ? ?2) anxiety and depression.  This has been chronic and was accentuated during his treatment for lymphoma and this continues to be persistent. ?-We will refer to Dr. Elias Else for further evaluation and management since this is affecting his quality of life. ? ? ?FOLLOW UP: ?Return to clinic with Dr. [name] with labs in 6 months ?CT chest abdomen pelvis in 22 weeks ?Referral to Dr. Elias Else for anxiety/depression psychological counseling ? ?The total time spent in the appointment was 21 minutes*. ? ?All of the patient's questions were answered with apparent satisfaction. The patient knows to call the clinic with any problems, questions or concerns. ? ? ?Sullivan Lone MD [place] AAHIVMS [facility] ?Hematology/Oncology Physician ?[place] ? ?.*Total Encounter Time as defined by the Centers for Medicare and Medicaid Services includes, in addition to the face-to-face time of a patient visit (documented in the note above) non-face-to-face time: obtaining and reviewing outside history, ordering and reviewing medications, tests or procedures, care coordination (communications with other health care professionals or caregivers) and documentation in the medical record. ? ? ?

## 2021-12-03 ENCOUNTER — Other Ambulatory Visit: Payer: Self-pay

## 2021-12-03 ENCOUNTER — Ambulatory Visit (INDEPENDENT_AMBULATORY_CARE_PROVIDER_SITE_OTHER): Payer: PRIVATE HEALTH INSURANCE | Admitting: Internal Medicine

## 2021-12-03 ENCOUNTER — Encounter: Payer: Self-pay | Admitting: Internal Medicine

## 2021-12-03 VITALS — BP 110/76 | HR 100 | Ht 73.0 in | Wt 176.2 lb

## 2021-12-03 DIAGNOSIS — I77810 Thoracic aortic ectasia: Secondary | ICD-10-CM

## 2021-12-03 DIAGNOSIS — E785 Hyperlipidemia, unspecified: Secondary | ICD-10-CM

## 2021-12-03 DIAGNOSIS — I700 Atherosclerosis of aorta: Secondary | ICD-10-CM | POA: Diagnosis not present

## 2021-12-03 DIAGNOSIS — R931 Abnormal findings on diagnostic imaging of heart and coronary circulation: Secondary | ICD-10-CM | POA: Diagnosis not present

## 2021-12-03 NOTE — Progress Notes (Signed)
? ?OFFICE CONSULT NOTE ? ?Chief Complaint:  ?Follow-up  ? ?Primary Care Physician: ?George Pretty, MD ? ?HPI:  ?George Proctor is a 66 y.o. male who is being seen today for the evaluation of the above chief complaints at the request of George Pretty, MD. George Proctor has a history of anxiety and depression as well as memory loss in the past. Recently he's been suffering from shortness of breath, fatigue, mental fogginess, anhedonia and symptoms concerning for depression. He denies any anginal symptoms, per se. His shortness of breath sometimes is associated with exertion and can be present at rest. He says that he's had a mental fogginess and has definitely struggled with depression symptoms. He is currently on nortriptyline for his depression. Otherwise he takes no medications. Is no history of hypertension, diabetes or significant dyslipidemia. Family history is significant for hypertension and heart disease in his father but he still living at age 29.  ? ?03/04/2017 ? ?George Proctor returns today for follow-up. He continues to report some significant fatigue and depression. He has been referred to a psychiatrist who started him on Adderall, bupropion and Rexulti with diagnosis of depression and ADD. He says now he feels worse than he was when he started the medications. He says he is extremely slow to get moving in the morning after starting on the medications. Today we reviewed his echocardiogram which demonstrates further effacement of the aortic valve measuring 4.6 cm at the sinus of Valsalva with mild to moderate aortic insufficiency. He is also hypotensive today 98/70. He is not on any blood pressure medication and reports she's had some occasional positional dizziness. This could be related to [place], which is a fairly newer medication, but has listed orthostatic hypotension as a side effect. Again, George Proctor had a cardiac MRA in 2015 which showed some worsening of his aortic root dilatation compared to an earlier  study, but no ascending aortic aneurysm. This echocardiogram also demonstrated an ascending aortic measurement of 3.8 cm. He reported that his mother died of a thoracic aneurysm as well. ? ?08/10/2018 ? ?George Proctor is seen today in follow-up.  He underwent an echo this summer was seen by George Deforest, PA-C for dyspnea.  This seems to be fairly consistent and does not seem to be worsening.  Is not thought that the AI is a cause for his dyspnea.  His aortic root is stable in size.  In fact he had a CT angiogram earlier this year which showed a somewhat smaller measurement 4.2 cm.  This will need to be followed by echo.  He does report his chest pain has improved and it was felt that this was reflux.  He is currently on a PPI.  He does see psychiatry but is been on and off various medications for depression and fatigue without much benefit. ? ?04/05/2019 ? ?George Proctor is referred back for follow-up today.  He recently saw his PCP and had described some chest discomfort.  This was left sternal discomfort which was in a small area and rated as 1 out of 10 in intensity.  It did not radiate.  Subsequently has gone away.  Does not sound cardiac in nature.  He continues to struggle with depression and inactivity.  He says antidepressants including stimulants that were not effective for him.  He is not currently on any medications other than a PPI.  EKG performed in their office showed sinus rhythm with some nonspecific QRS changes.  I repeated an EKG today which  shows sinus tachycardia at 107 without ischemic changes and some possible right atrial enlargement.  He does have a history of an aneurysmal thoracic aorta as well as the descending aortic aneurysmal segment.  He was seen by Dr. early about a year ago who recommended repeat imaging.  A CT has been ordered but not obtained. ? ?05/12/2020 ? ?George Proctor is seen today for annual follow-up.  Overall he continues to do well without complaints.  He is undergoing some physical therapy.   More recently was noted to have somewhat elevated cholesterol with an LDL 139.  He says he eats healthy and is very active.  This could be related also to testosterone injections which he is undergoing and more recently he started on Latuda and his depression.  Some chest pain earlier this spring and was seen by George Deforest, PA-C and underwent nuclear stress testing in May 2021 which is low risk Myoview showed an EF of 61%.  He reports afterwards the chest pain had resolved. ? ?11/13/2020 ? ?George Proctor is seen today in follow-up.  He is accompanied by his wife George Proctor.  Unfortunately George Proctor was diagnosed with lymphoma this past year.  He is undergoing chemotherapy.  As part of the work-up he is undergone a number of CT scans which have demonstrated aortic atherosclerosis and calcified and noncalcified plaque in the thoracic aorta.  Specific coronary artery calcification was not commented on.  He did undergo Myoview stress testing in May 2021 which was negative for ischemia.  Echo in October 2021 demonstrated normal LVEF 65 to 26%, grade 1 diastolic dysfunction.  We have persistently followed him for dilated aortic root however none of the studies have suggested that it is dilated.  He had recent labs including a lipid profile showing a total cholesterol of 9, triglycerides 1 are 91, HDL 41 and LDL 153.  He has not previously been on any therapy for this. ? ?12/03/2021 ? ?George Proctor returns today for follow-up.  He is in remission from follicular lymphoma.  He still has issues with fatigue and some balance issues with numbness in both great toes.  He is not currently on any medications.  In the past he has had dyslipidemia but cannot tolerate statins.  Lipids in August 2022 showed total cholesterol 146, HDL 35, LDL 88 and triglycerides 124.  He has been noted to have coronary artery calcification in the left coronary artery as well as PAD in the past and therefore his target LDL is less than 70. ? ? ?PMHx:  ?Past Medical  History:  ?Diagnosis Date  ? Anxiety   ? Depression   ? Gait difficulty   ? Lymphoma ([place]) dx'd 05/2020  ? Memory loss   ? ? ?Past Surgical History:  ?Procedure Laterality Date  ? ABDOMINAL SURGERY    ? Childhood  ? BUNIONECTOMY    ? IR IMAGING GUIDED PORT INSERTION  06/30/2020  ? IR PARACENTESIS  06/26/2020  ? IR REMOVAL TUN ACCESS W/ PORT W/O FL MOD SED  05/17/2021  ? IR US GUIDE BX ASP/DRAIN  06/26/2020  ? ? ?FAMHx:  ?Family History  ?Problem Relation Age of Onset  ? Aneurysm Mother 55  ?     Thoracic  ? Hypertension Father   ? Heart failure Father   ? Heart disease Father   ? Schizophrenia Maternal Uncle   ? Heart attack Paternal Grandfather   ? Depression Cousin   ? Bipolar disorder Cousin   ? Sleep apnea Neg Hx   ? ? ?  SOCHx:  ? reports that he has never smoked. He has never used smokeless tobacco. He reports that he does not drink alcohol and does not use drugs. ? ?ALLERGIES:  ?Allergies  ?Allergen Reactions  ? Crestor [Rosuvastatin] Other (See Comments)  ?  Muscle/join pain, weakness  ? Omeprazole Nausea And Vomiting  ? ? ?ROS: ?Pertinent items noted in HPI and remainder of comprehensive ROS otherwise negative. ? ?HOME MEDS: ?Current Outpatient Medications on File Prior to Visit  ?Medication Sig Dispense Refill  ? Ascorbic Acid (VITAMIN C) 1000 MG tablet Take 1,000 mg by mouth daily.    ? B Complex Vitamins (VITAMIN B COMPLEX PO) Take 1 tablet by mouth daily.     ? Multiple Vitamin (MULTIVITAMIN) capsule Take 1 capsule by mouth daily.    ? VITAMIN D PO Take by mouth.    ? zolpidem (AMBIEN CR) 12.5 MG CR tablet TAKE 1 TABLET BY MOUTH AT BEDTIME AS NEEDED FOR SLEEP. 30 tablet 0  ? ?No current facility-administered medications on file prior to visit.  ? ? ?LABS/IMAGING: ?No results found for this or any previous visit (from the past 48 hour(s)). ?No results found. ? ?LIPID PANEL: ?   ?Component Value Date/Time  ? CHOL 146 05/14/2021 0838  ? TRIG 126 05/14/2021 0838  ? HDL 35 (L) 05/14/2021 0867  ? CHOLHDL 4.2  05/14/2021 0838  ? CHOLHDL 5.2 CALC 11/03/2007 1136  ? VLDL 24 11/03/2007 1136  ? [place] 88 05/14/2021 0838  ? ? ?WEIGHTS: ?Wt Readings from Last 3 Encounters:  ?12/03/21 176 lb 3.2 oz (79.9 kg)  ?11/19/21 171

## 2021-12-03 NOTE — Patient Instructions (Signed)
Medication Instructions:  ?NO CHANGES ? ?*If you need a refill on your cardiac medications before your next appointment, please call your pharmacy* ? ? ?Lab Work: ?FASTING lipid panel to check cholesterol  ? ?If you have labs (blood work) drawn today and your tests are completely normal, you will receive your results only by: ?MyChart Message (if you have MyChart) OR ?A paper copy in the mail ?If you have any lab test that is abnormal or we need to change your treatment, we will call you to review the results. ? ? ?Testing/Procedures: ?NONE ? ? ?Follow-Up: ?At [facility], you and your health needs are our priority.  As part of our continuing mission to provide you with exceptional heart care, we have created designated Provider Care Teams.  These Care Teams include your primary Cardiologist (physician) and Advanced Practice Providers (APPs -  Physician Assistants and Nurse Practitioners) who all work together to provide you with the care you need, when you need it. ? ?We recommend signing up for the patient portal called "MyChart".  Sign up information is provided on this After Visit Summary.  MyChart is used to connect with patients for Virtual Visits (Telemedicine).  Patients are able to view lab/test results, encounter notes, upcoming appointments, etc.  Non-urgent messages can be sent to your provider as well.   ?To learn more about what you can do with MyChart, go to [email or web address].   ? ?Your next appointment:   ?12 month(s) ? ?The format for your next appointment:   ?In Person ? ?Provider:   ?Pixie Casino, MD { ? ? ?

## 2021-12-04 DIAGNOSIS — E785 Hyperlipidemia, unspecified: Secondary | ICD-10-CM | POA: Diagnosis not present

## 2021-12-04 LAB — LIPID PANEL
Chol/HDL Ratio: 4.3 ratio (ref 0.0–5.0)
Cholesterol, Total: 149 mg/dL (ref 100–199)
HDL: 35 mg/dL — ABNORMAL LOW (ref >39)
LDL Chol Calc (NIH): 82 mg/dL (ref 0–99)
Triglycerides: 187 mg/dL — ABNORMAL HIGH (ref 0–149)
VLDL Cholesterol Cal: 32 mg/dL (ref 5–40)

## 2022-01-07 NOTE — Telephone Encounter (Signed)
Ok thanks 

## 2022-01-28 DIAGNOSIS — F411 Generalized anxiety disorder: Secondary | ICD-10-CM | POA: Diagnosis not present

## 2022-01-28 DIAGNOSIS — R69 Illness, unspecified: Secondary | ICD-10-CM | POA: Diagnosis not present

## 2022-01-28 DIAGNOSIS — Z79899 Other long term (current) drug therapy: Secondary | ICD-10-CM | POA: Diagnosis not present

## 2022-01-28 DIAGNOSIS — C8590 Non-Hodgkin lymphoma, unspecified, unspecified site: Secondary | ICD-10-CM | POA: Diagnosis not present

## 2022-03-28 DIAGNOSIS — M009 Pyogenic arthritis, unspecified: Secondary | ICD-10-CM | POA: Diagnosis not present

## 2022-03-29 DIAGNOSIS — M79641 Pain in right hand: Secondary | ICD-10-CM | POA: Diagnosis not present

## 2022-04-04 DIAGNOSIS — Z0000 Encounter for general adult medical examination without abnormal findings: Secondary | ICD-10-CM | POA: Diagnosis not present

## 2022-04-04 DIAGNOSIS — Z125 Encounter for screening for malignant neoplasm of prostate: Secondary | ICD-10-CM | POA: Diagnosis not present

## 2022-04-05 DIAGNOSIS — L03113 Cellulitis of right upper limb: Secondary | ICD-10-CM | POA: Diagnosis not present

## 2022-04-08 DIAGNOSIS — L905 Scar conditions and fibrosis of skin: Secondary | ICD-10-CM | POA: Diagnosis not present

## 2022-04-08 DIAGNOSIS — R52 Pain, unspecified: Secondary | ICD-10-CM | POA: Diagnosis not present

## 2022-04-08 DIAGNOSIS — W5501XA Bitten by cat, initial encounter: Secondary | ICD-10-CM | POA: Diagnosis not present

## 2022-04-09 DIAGNOSIS — C8248 Follicular lymphoma grade IIIb, lymph nodes of multiple sites: Secondary | ICD-10-CM | POA: Diagnosis not present

## 2022-04-09 DIAGNOSIS — F5101 Primary insomnia: Secondary | ICD-10-CM | POA: Diagnosis not present

## 2022-04-09 DIAGNOSIS — I2510 Atherosclerotic heart disease of native coronary artery without angina pectoris: Secondary | ICD-10-CM | POA: Diagnosis not present

## 2022-04-09 DIAGNOSIS — G620 Drug-induced polyneuropathy: Secondary | ICD-10-CM | POA: Diagnosis not present

## 2022-04-09 DIAGNOSIS — D72819 Decreased white blood cell count, unspecified: Secondary | ICD-10-CM | POA: Diagnosis not present

## 2022-04-09 DIAGNOSIS — I723 Aneurysm of iliac artery: Secondary | ICD-10-CM | POA: Diagnosis not present

## 2022-04-09 DIAGNOSIS — I351 Nonrheumatic aortic (valve) insufficiency: Secondary | ICD-10-CM | POA: Diagnosis not present

## 2022-04-09 DIAGNOSIS — F339 Major depressive disorder, recurrent, unspecified: Secondary | ICD-10-CM | POA: Diagnosis not present

## 2022-04-09 DIAGNOSIS — K429 Umbilical hernia without obstruction or gangrene: Secondary | ICD-10-CM | POA: Diagnosis not present

## 2022-04-09 DIAGNOSIS — I77810 Thoracic aortic ectasia: Secondary | ICD-10-CM | POA: Diagnosis not present

## 2022-04-09 DIAGNOSIS — I700 Atherosclerosis of aorta: Secondary | ICD-10-CM | POA: Diagnosis not present

## 2022-04-09 DIAGNOSIS — Z0000 Encounter for general adult medical examination without abnormal findings: Secondary | ICD-10-CM | POA: Diagnosis not present

## 2022-04-17 DIAGNOSIS — K219 Gastro-esophageal reflux disease without esophagitis: Secondary | ICD-10-CM | POA: Diagnosis not present

## 2022-04-17 DIAGNOSIS — R52 Pain, unspecified: Secondary | ICD-10-CM | POA: Diagnosis not present

## 2022-04-17 DIAGNOSIS — K5900 Constipation, unspecified: Secondary | ICD-10-CM | POA: Diagnosis not present

## 2022-04-17 DIAGNOSIS — W5501XA Bitten by cat, initial encounter: Secondary | ICD-10-CM | POA: Diagnosis not present

## 2022-04-17 DIAGNOSIS — M25641 Stiffness of right hand, not elsewhere classified: Secondary | ICD-10-CM | POA: Diagnosis not present

## 2022-04-17 DIAGNOSIS — R29898 Other symptoms and signs involving the musculoskeletal system: Secondary | ICD-10-CM | POA: Diagnosis not present

## 2022-04-17 DIAGNOSIS — R1310 Dysphagia, unspecified: Secondary | ICD-10-CM | POA: Diagnosis not present

## 2022-04-17 DIAGNOSIS — L905 Scar conditions and fibrosis of skin: Secondary | ICD-10-CM | POA: Diagnosis not present

## 2022-04-29 ENCOUNTER — Other Ambulatory Visit (HOSPITAL_COMMUNITY): Payer: PRIVATE HEALTH INSURANCE

## 2022-04-30 ENCOUNTER — Ambulatory Visit
Admission: RE | Admit: 2022-04-30 | Discharge: 2022-04-30 | Disposition: A | Discharge status: Home / Self Care | Payer: PRIVATE HEALTH INSURANCE | Source: Ambulatory Visit | Attending: Hematology | Admitting: Hematology

## 2022-04-30 DIAGNOSIS — C8290 Follicular lymphoma, unspecified, unspecified site: Secondary | ICD-10-CM | POA: Diagnosis not present

## 2022-04-30 DIAGNOSIS — C8248 Follicular lymphoma grade IIIb, lymph nodes of multiple sites: Secondary | ICD-10-CM

## 2022-04-30 DIAGNOSIS — K439 Ventral hernia without obstruction or gangrene: Secondary | ICD-10-CM | POA: Diagnosis not present

## 2022-04-30 DIAGNOSIS — I700 Atherosclerosis of aorta: Secondary | ICD-10-CM | POA: Diagnosis not present

## 2022-04-30 DIAGNOSIS — K5730 Diverticulosis of large intestine without perforation or abscess without bleeding: Secondary | ICD-10-CM | POA: Diagnosis not present

## 2022-04-30 DIAGNOSIS — J984 Other disorders of lung: Secondary | ICD-10-CM | POA: Diagnosis not present

## 2022-04-30 MED ORDER — IOPAMIDOL (ISOVUE-300) INJECTION 61%
100.0000 mL | Freq: Once | INTRAVENOUS | Status: AC | PRN
  Administered 2022-04-30: 100 mL via INTRAVENOUS

## 2022-05-22 ENCOUNTER — Inpatient Hospital Stay: Payer: 59

## 2022-05-22 ENCOUNTER — Other Ambulatory Visit: Payer: Self-pay

## 2022-05-22 ENCOUNTER — Inpatient Hospital Stay: Payer: 59 | Attending: Hematology | Admitting: Hematology

## 2022-05-22 VITALS — BP 120/76 | HR 82 | Temp 97.5°F | Resp 17 | Wt 176.2 lb

## 2022-05-22 DIAGNOSIS — Z08 Encounter for follow-up examination after completed treatment for malignant neoplasm: Secondary | ICD-10-CM | POA: Diagnosis not present

## 2022-05-22 DIAGNOSIS — C8248 Follicular lymphoma grade IIIb, lymph nodes of multiple sites: Secondary | ICD-10-CM

## 2022-05-22 DIAGNOSIS — Z8571 Personal history of Hodgkin lymphoma: Secondary | ICD-10-CM | POA: Diagnosis not present

## 2022-05-22 LAB — LACTATE DEHYDROGENASE: LDH: 137 U/L (ref 98–192)

## 2022-05-22 LAB — CBC WITH DIFFERENTIAL/PLATELET
Abs Immature Granulocytes: 0.01 10*3/uL (ref 0.00–0.07)
Basophils Absolute: 0 10*3/uL (ref 0.0–0.1)
Basophils Relative: 0 %
Eosinophils Absolute: 0.1 10*3/uL (ref 0.0–0.5)
Eosinophils Relative: 3 %
HCT: 40.9 % (ref 39.0–52.0)
Hemoglobin: 13.9 g/dL (ref 13.0–17.0)
Immature Granulocytes: 0 %
Lymphocytes Relative: 21 %
Lymphs Abs: 1 10*3/uL (ref 0.7–4.0)
MCH: 30.2 pg (ref 26.0–34.0)
MCHC: 34 g/dL (ref 30.0–36.0)
MCV: 88.9 fL (ref 80.0–100.0)
Monocytes Absolute: 0.4 10*3/uL (ref 0.1–1.0)
Monocytes Relative: 8 %
Neutro Abs: 3.3 10*3/uL (ref 1.7–7.7)
Neutrophils Relative %: 68 %
Platelets: 170 10*3/uL (ref 150–400)
RBC: 4.6 MIL/uL (ref 4.22–5.81)
RDW: 13.9 % (ref 11.5–15.5)
WBC: 4.9 10*3/uL (ref 4.0–10.5)
nRBC: 0 % (ref 0.0–0.2)

## 2022-05-22 LAB — CMP (CANCER CENTER ONLY)
ALT: 23 U/L (ref 0–44)
AST: 18 U/L (ref 15–41)
Albumin: 4.6 g/dL (ref 3.5–5.0)
Alkaline Phosphatase: 76 U/L (ref 38–126)
Anion gap: 6 (ref 5–15)
BUN: 19 mg/dL (ref 8–23)
CO2: 28 mmol/L (ref 22–32)
Calcium: 9.7 mg/dL (ref 8.9–10.3)
Chloride: 106 mmol/L (ref 98–111)
Creatinine: 0.92 mg/dL (ref 0.61–1.24)
GFR, Estimated: >60 mL/min (ref >60)
Glucose, Bld: 115 mg/dL — ABNORMAL HIGH (ref 70–99)
Potassium: 3.9 mmol/L (ref 3.5–5.1)
Sodium: 140 mmol/L (ref 135–145)
Total Bilirubin: 0.5 mg/dL (ref 0.3–1.2)
Total Protein: 6.4 g/dL — ABNORMAL LOW (ref 6.5–8.1)

## 2022-05-26 NOTE — Progress Notes (Signed)
HEMATOLOGY/ONCOLOGY CLINIC NOTE  Date of Service: 05/22/2022   Patient Care Team: [name], MD as PCP - General (Internal Medicine) Debara Pickett Nadean Corwin, MD as PCP - Cardiology (Cardiology) [name], MD (Internal Medicine)  CHIEF COMPLAINTS/PURPOSE OF CONSULTATION:  Continued evaluation and management of high-grade follicular lymphoma  HISTORY OF PRESENTING ILLNESS:  Please see previous note for details of initial presentation  INTERVAL HISTORY: George Proctor is a wonderful 66 y.o. male who is here for continued evaluation and management of his high-grade follicular lymphoma. He is accompanied by his wife for this clinic visit. He reports He is doing well with no new symptoms or concerns.  No fever, chills, night sweats. No new or unexpected weight loss. No other new or acute focal symptoms.  Labs done today were reviewed with the patient  We discussed his CT chest, abd, pelvis done 04/30/2022  MEDICAL HISTORY:  Past Medical History:  Diagnosis Date   Anxiety    Depression    Gait difficulty    Lymphoma ([place]) dx'd 05/2020   Memory loss    SURGICAL HISTORY: Past Surgical History:  Procedure Laterality Date   ABDOMINAL SURGERY     Childhood   BUNIONECTOMY     IR IMAGING GUIDED PORT INSERTION  06/30/2020   IR PARACENTESIS  06/26/2020   IR REMOVAL TUN ACCESS W/ PORT W/O FL MOD SED  05/17/2021   IR US GUIDE BX ASP/DRAIN  06/26/2020    SOCIAL HISTORY: Social History   Socioeconomic History   Marital status: Married    Spouse name: Not on file   Number of children: 0   Years of education: [employer]   Highest education level: Not on file  Occupational History   Occupation: Scientist, research (physical sciences)  Tobacco Use   Smoking status: Never   Smokeless tobacco: Never  Vaping Use   Vaping Use: Never used  Substance and Sexual Activity   Alcohol use: No    Alcohol/week: 0.0 standard drinks of alcohol    Comment: Less than one drink per week.    Drug use: No   Sexual activity: Not Currently  Other Topics Concern   Not on file  Social History Narrative   Lives at home with wife.   Right-handed.   3 cups caffeine per day.   Social Determinants of Health   Financial Resource Strain: Not on file  Food Insecurity: Not on file  Transportation Needs: Not on file  Physical Activity: Not on file  Stress: Not on file  Social Connections: Not on file  Intimate Partner Violence: Not on file    FAMILY HISTORY: Family History  Problem Relation Age of Onset   Aneurysm Mother 65       Thoracic   Hypertension Father    Heart failure Father    Heart disease Father    Schizophrenia Maternal Uncle    Heart attack Paternal Grandfather    Depression Cousin    Bipolar disorder Cousin    Sleep apnea Neg Hx     ALLERGIES:  is allergic to crestor [rosuvastatin] and omeprazole.  MEDICATIONS:  Current Outpatient Medications  Medication Sig Dispense Refill   Ascorbic Acid (VITAMIN C) 1000 MG tablet Take 1,000 mg by mouth daily.     B Complex Vitamins (VITAMIN B COMPLEX PO) Take 1 tablet by mouth daily.      Multiple Vitamin (MULTIVITAMIN) capsule Take 1 capsule by mouth daily.     VITAMIN D PO Take by mouth.  zolpidem (AMBIEN CR) 12.5 MG CR tablet TAKE 1 TABLET BY MOUTH AT BEDTIME AS NEEDED FOR SLEEP. 30 tablet 0   No current facility-administered medications for this visit.    REVIEW OF SYSTEMS:   10 Point review of Systems was done is negative except as noted above.  PHYSICAL EXAMINATION: ECOG PERFORMANCE STATUS: 2 - Symptomatic, <50% confined to bed  . Vitals:   05/22/22 0945  BP: 120/76  Pulse: 82  Resp: 17  Temp: (!) 97.5 F (36.4 C)  SpO2: 95%    Filed Weights   05/22/22 0945  Weight: 176 lb 4 oz (79.9 kg)   . Wt Readings from Last 3 Encounters:  05/22/22 176 lb 4 oz (79.9 kg)  12/03/21 176 lb 3.2 oz (79.9 kg)  11/19/21 171 lb (77.6 kg)    .Body mass index is 23.25 kg/m. NAD GENERAL:alert, in no  acute distress and comfortable SKIN: no acute rashes, no significant lesions EYES: conjunctiva are pink and non-injected, sclera anicteric NECK: supple, no JVD LYMPH:  no palpable lymphadenopathy in the cervical, axillary or inguinal regions LUNGS: clear to auscultation b/l with normal respiratory effort HEART: regular rate & rhythm ABDOMEN:  normoactive bowel sounds , non tender, not distended. Extremity: no pedal edema PSYCH: alert & oriented x 3 with fluent speech NEURO: no focal motor/sensory deficits  LABORATORY DATA:  I have reviewed the data as listed  .    Latest Ref Rng & Units 05/22/2022    9:10 AM 11/19/2021    8:56 AM 07/23/2021    8:39 AM  CBC  WBC 4.0 - 10.5 K/uL 4.9  5.3  3.5   Hemoglobin 13.0 - 17.0 g/dL 13.9  14.0  14.0   Hematocrit 39.0 - 52.0 % 40.9  42.2  42.3   Platelets 150 - 400 K/uL 170  179  187     .    Latest Ref Rng & Units 05/22/2022    9:10 AM 11/19/2021    8:56 AM 07/23/2021    8:39 AM  CMP  Glucose 70 - 99 mg/dL 115  91  82   BUN 8 - 23 mg/dL '19  13  13   '$ Creatinine 0.61 - 1.24 mg/dL 0.92  0.91  1.01   Sodium 135 - 145 mmol/L 140  141  143   Potassium 3.5 - 5.1 mmol/L 3.9  4.2  4.4   Chloride 98 - 111 mmol/L 106  108  108   CO2 22 - 32 mmol/L '28  28  26   '$ Calcium 8.9 - 10.3 mg/dL 9.7  9.4  8.8   Total Protein 6.5 - 8.1 g/dL 6.4  6.3  6.0   Total Bilirubin 0.3 - 1.2 mg/dL 0.5  0.5  0.4   Alkaline Phos 38 - 126 U/L 76  69  79   AST 15 - 41 U/L '18  18  17   '$ ALT 0 - 44 U/L '23  21  18    '$ . Lab Results  Component Value Date   LDH 137 05/22/2022    RADIOGRAPHIC STUDIES: I have personally reviewed the radiological images as listed and agreed with the findings in the report. CT CHEST ABDOMEN PELVIS W CONTRAST  Result Date: 05/01/2022 CLINICAL DATA:  History of follicular lymphoma, re-evaluation. * Tracking Code: BO * EXAM: CT CHEST, ABDOMEN, AND PELVIS WITH CONTRAST TECHNIQUE: Multidetector CT imaging of the chest, abdomen and pelvis was  performed following the standard protocol during bolus administration of intravenous contrast. RADIATION  DOSE REDUCTION: This exam was performed according to the departmental dose-optimization program which includes automated exposure control, adjustment of the mA and/or kV according to patient size and/or use of iterative reconstruction technique. CONTRAST:  146m ISOVUE-300 IOPAMIDOL (ISOVUE-300) INJECTION 61% COMPARISON:  Multiple priors including most recent CT Jan 16, 2021 FINDINGS: CT CHEST FINDINGS Cardiovascular: Interval removal of the right chest Port-A-Cath. Normal caliber thoracic aorta. No central pulmonary embolus on this nondedicated study. Normal size heart. No significant pericardial effusion/thickening. Mediastinum/Nodes: No suspicious thyroid nodule. No pathologically enlarged mediastinal, hilar or axillary lymph nodes. Esophagus is grossly unremarkable. Lungs/Pleura: Mild biapical pleuroparenchymal scarring. No focal airspace consolidation. No suspicious pulmonary nodules or masses. No pleural effusion. No pneumothorax. Musculoskeletal: No aggressive lytic or blastic lesion of bone. Multilevel degenerative changes spine. CT ABDOMEN PELVIS FINDINGS Hepatobiliary: No suspicious hepatic lesion. Gallbladder is unremarkable. No biliary ductal dilation. Pancreas: No pancreatic ductal dilation or evidence of acute inflammation. Spleen: No splenomegaly or focal splenic lesion. Adrenals/Urinary Tract: Bilateral adrenal glands appear normal. No hydronephrosis. Kidneys demonstrate symmetric enhancement and excretion of contrast material. Urinary bladder is unremarkable for degree of distension. Stomach/Bowel: Radiopaque enteric contrast material traverses distal loops of small bowel. Stomach is unremarkable for degree of distension. No pathologic dilation of small or large bowel. No evidence of acute bowel inflammation. Left-sided colonic diverticulosis without findings of acute diverticulitis.  Vascular/Lymphatic: Aortic atherosclerosis. Similar to slight interval decrease in size of the ill-defined retroperitoneal lymph nodes and associated soft tissue stranding with the indexed left periaortic retroperitoneal lymph node measuring 10 x 5 mm on image 77/2 previously 10 x 7 mm. Similar soft tissue about the common iliac vessels and left iliac side chain/pelvic sidewall stations. Similar ill-defined soft tissue stranding about the central small bowel mesentery. No new suspicious adenopathy. Reproductive: Prostate is unremarkable. Other: Small fat containing ventral hernia. Musculoskeletal: No aggressive lytic or blastic lesion of bone. Multilevel degenerative changes spine. IMPRESSION: 1. Slight interval decrease in the non pathologically enlarged abdominopelvic lymph nodes. Similar ill-defined soft tissue in the small bowel mesentery, and ill-defined soft tissue along the common iliac as well as the left iliac side chain/pelvic sidewall. No new or progressive adenopathy in the abdomen or pelvis. 2. No thoracic adenopathy and no splenomegaly. 3.  Aortic Atherosclerosis (ICD10-I70.0). Electronically Signed   By: JDahlia BailiffM.D.   On: 05/01/2022 10:06        ASSESSMENT & PLAN:   66yo with   1) Stage III/IV high grade follicular lymphoma-in complete remission S/p 6 cycles of R-CHOP with CR  PLAN: -Labs done today were discussed with the patient in detail. CBC within normal limits with hemoglobin of 13.9 WBC count of 4.9k and platelets of 170k CMP unremarkable  LDH within normal limits at 137 Patient has no clinical or lab evidence of lymphoma recurrence/progression at this time. -Discussed need for ongoing wellness interventions including healthy diet, regular physical activity and distressing. -We discussed his CT chest, abd, pelvis done 04/30/2022 which revealed a slight decrease in abdominopelvic lymph nodes. No new or progressive changes in adenopathy at this time.  2) anxiety and  depression.  This has been chronic and was accentuated during his treatment for lymphoma and this continues to be persistent. -He has been referred to Dr. MElias Elsefor further evaluation and management since this is affecting his quality of life.   FOLLOW UP: Return to clinic with Dr. KIrene Limbowith labs in 6 months  The total time spent in the appointment was 20  minutes*.  All of the patient's questions were answered with apparent satisfaction. The patient knows to call the clinic with any problems, questions or concerns.   Sullivan Lone MD MS AAHIVMS [facility] Hematology/Oncology [facility]  .*Total Encounter Time as defined by the Centers for Medicare and Medicaid Services includes, in addition to the face-to-face time of a patient visit (documented in the note above) non-face-to-face time: obtaining and reviewing outside history, ordering and reviewing medications, tests or procedures, care coordination (communications with other health care professionals or caregivers) and documentation in the medical record.  I, Melene Muller, am acting as scribe for Dr. Sullivan Lone, MD.  .I have reviewed the above documentation for accuracy and completeness, and I agree with the above. Brunetta Genera MD

## 2022-05-29 ENCOUNTER — Ambulatory Visit (INDEPENDENT_AMBULATORY_CARE_PROVIDER_SITE_OTHER): Payer: 59 | Admitting: Psychologist

## 2022-05-29 DIAGNOSIS — F411 Generalized anxiety disorder: Secondary | ICD-10-CM

## 2022-05-29 DIAGNOSIS — F331 Major depressive disorder, recurrent, moderate: Secondary | ICD-10-CM | POA: Diagnosis not present

## 2022-05-29 DIAGNOSIS — R69 Illness, unspecified: Secondary | ICD-10-CM | POA: Diagnosis not present

## 2022-05-29 NOTE — Plan of Care (Signed)

## 2022-05-29 NOTE — Progress Notes (Signed)
[place] Counselor Initial Adult Exam  Name: George Proctor Date: 05/29/2022 MRN: [id number] DOB: 1955-12-31 PCP: [name], MD  Time spent: 03:05 pm to 03:45 pm; total time: 40 minutes  This session was held via in person. The patient consented to in-person therapy and was in the clinician's office. Limits of confidentiality were discussed with the patient.   Guardian/Payee:  NA    Paperwork requested: No   Reason for Visit /Presenting Problem: Depression and anxiety  Mental Status Exam: Appearance:   Well Groomed     Behavior:  Appropriate  Motor:  Normal  Speech/Language:   Clear and Coherent  Affect:  Appropriate  Mood:  normal  Thought process:  normal  Thought content:    WNL  Sensory/Perceptual disturbances:    WNL  Orientation:  oriented to person, place, and time/date  Attention:  Good  Concentration:  Good  Memory:  WNL  Fund of knowledge:   Good  Insight:    Fair  Judgment:   Good  Impulse Control:  Good     Reported Symptoms:  The patient endorsed experiencing the following: feeling down, sad, rumination of negative thoughts, social isolation, avoiding pleasurable activities, fatigue, difficulty with sleep, thoughts of hopelessness, low self-esteem, and passive suicidal ideation. He denied a plan or intent to act on a plan. He denied homicidal ideation.   The patient endorsed experiencing the following: racing thoughts, feeling on edge, feeling restless, and being unable to control worries. He endorsed passive suicidal ideation. He denied a plan or intent to act on a plan. He denied homicidal ideation.   Risk Assessment: Danger to Self:   Patient endorsed passive suicidal ideation. He denied having a plan or intent to act on a plan.  Self-injurious Behavior: No Danger to Others: No Duty to Warn:no Physical Aggression / Violence:No  Access to Firearms a concern: No  Gang Involvement:No  Patient / guardian was educated about steps to  take if suicide or homicide risk level increases between visits: n/a While future psychiatric events cannot be accurately predicted, the patient does not currently require acute inpatient psychiatric care and does not currently meet [facility] involuntary commitment criteria.  Substance Abuse History: Current substance abuse: No     Past Psychiatric History:   Previous psychological history is significant for depression Outpatient Providers:NA History of Psych Hospitalization: No  Psychological Testing:  NA    Abuse History:  Victim of: No.,  NA    Report needed: No. Victim of Neglect:No. Perpetrator of  NA   Witness / Exposure to Domestic Violence: No   Protective Services Involvement: No  Witness to [employer] Violence:  No   Family History:  Family History  Problem Relation Age of Onset   Aneurysm Mother 57       Thoracic   Hypertension Father    Heart failure Father    Heart disease Father    Schizophrenia Maternal Uncle    Heart attack Paternal Grandfather    Depression Cousin    Bipolar disorder Cousin    Sleep apnea Neg Hx     Living situation: the patient lives with their family  Sexual Orientation: Straight  Relationship Status: married  Name of spouse / other:[name]  If a parent, number of children / ages:Patient has a step son  Support Systems: friends  Museum/gallery curator Stress:  No   Income/Employment/Disability: Actor: No   Educational History: Education: Museum/gallery exhibitions officer: NA  Any  cultural differences that may affect / interfere with treatment:  not applicable   Recreation/Hobbies: NA  Stressors: Other: Adjusting to fatigue from cancer treatment, communication with spouse, anxiety, etc.     Strengths: Friends  Barriers:  NA   Legal History: Pending legal issue / charges: The patient has no significant history of legal issues. History of legal issue / charges:   NA  Medical History/Surgical History: reviewed Past Medical History:  Diagnosis Date   Anxiety    Depression    Gait difficulty    Lymphoma ([facility]) dx'd 05/2020   Memory loss     Past Surgical History:  Procedure Laterality Date   ABDOMINAL SURGERY     Childhood   BUNIONECTOMY     IR IMAGING GUIDED PORT INSERTION  06/30/2020   IR PARACENTESIS  06/26/2020   IR REMOVAL TUN ACCESS W/ PORT W/O FL MOD SED  05/17/2021   IR US GUIDE BX ASP/DRAIN  06/26/2020    Medications: Current Outpatient Medications  Medication Sig Dispense Refill   Ascorbic Acid (VITAMIN C) 1000 MG tablet Take 1,000 mg by mouth daily.     B Complex Vitamins (VITAMIN B COMPLEX PO) Take 1 tablet by mouth daily.      Multiple Vitamin (MULTIVITAMIN) capsule Take 1 capsule by mouth daily.     VITAMIN D PO Take by mouth.     zolpidem (AMBIEN CR) 12.5 MG CR tablet TAKE 1 TABLET BY MOUTH AT BEDTIME AS NEEDED FOR SLEEP. 30 tablet 0   No current facility-administered medications for this visit.    Allergies  Allergen Reactions   Crestor [Rosuvastatin] Other (See Comments)    Muscle/join pain, weakness   Omeprazole Nausea And Vomiting    Diagnoses:  F33.1 Major depressive affective disorder, recurrent, moderate and F41.1 generalized anxiety disorder   Plan of Care: The patient is a 66 year old Caucasian male who was referred due to experiencing depression and anxiety. The patient lives at home with his wife. The patient meets criteria for a diagnosis of F33.1 major depressive affective disorder, recurrent, moderate based off of the following: feeling down, sad, rumination of negative thoughts, social isolation, avoiding pleasurable activities, fatigue, difficulty with sleep, thoughts of hopelessness, low self-esteem, and passive suicidal ideation. He denied a plan or intent to act on a plan. He denied homicidal ideation. The patient also meets criteria for a diagnosis of F41.1 generalized anxiety disorder based off of the  following:  racing thoughts, feeling on edge, feeling restless, and being unable to control worries. He endorsed passive suicidal ideation. He denied a plan or intent to act on a plan. He denied homicidal ideation. The patient may meet criteria for a diagnosis of ADHD and this should be assessed for a rule out.   The patient stated that he wants coping skills as well as to process emotions.  This psychologist makes the recommendation that the patient participate in therapy bi-weekly if possible.    Conception Chancy, PsyD

## 2022-05-29 NOTE — Progress Notes (Signed)
                George Swigart, PsyD 

## 2022-06-20 ENCOUNTER — Ambulatory Visit: Payer: 59 | Admitting: Psychologist

## 2022-06-20 DIAGNOSIS — F411 Generalized anxiety disorder: Secondary | ICD-10-CM

## 2022-06-20 DIAGNOSIS — F331 Major depressive disorder, recurrent, moderate: Secondary | ICD-10-CM

## 2022-06-20 DIAGNOSIS — R69 Illness, unspecified: Secondary | ICD-10-CM | POA: Diagnosis not present

## 2022-06-20 NOTE — Progress Notes (Signed)
                Necha Harries, PsyD 

## 2022-06-20 NOTE — Progress Notes (Signed)
[place] Counselor/Therapist Progress Note  Patient ID: George Proctor, MRN: [id number],    Date: 06/20/2022  Time Spent: 10:02 am to 10:40 am; total time: 38 minutes   This session was held via in person. The patient consented to in-person therapy and was in the clinician's office. Limits of confidentiality were discussed with the patient.   Treatment Type: Individual Therapy  Reported Symptoms: Depression and anxiety  Mental Status Exam: Appearance:  Well Groomed     Behavior: Appropriate  Motor: Normal  Speech/Language:  Clear and Coherent  Affect: Appropriate  Mood: normal  Thought process: normal  Thought content:   WNL  Sensory/Perceptual disturbances:   WNL  Orientation: oriented to person, place, and time/date  Attention: Good  Concentration: Good  Memory: WNL  Fund of knowledge:  Good  Insight:   Fair  Judgment:  Good  Impulse Control: Good   Risk Assessment: Danger to Self:  No Self-injurious Behavior: No Danger to Others: No Duty to Warn:no Physical Aggression / Violence:No  Access to Firearms a concern: No  Gang Involvement:No   Subjective: Beginning the session, patient described himself as okay. After reviewing the treatment plan, patient initially indicated that he wanted coping skills. After attempting coping skills, patient described himself as feeling overwhelmed. He expressed discouragement regarding not being able to do what he once did. He also voiced not having a meaning or purpose. He was agreeable to following up. He denied suicidal and homicidal ideation.    Interventions:  Worked on developing a therapeutic relationship with the patient using active listening and reflective statements. Provided emotional support using empathy and validation. Reviewed the treatment plan with the patient. Reviewed events since the last session. Normalized expressed thoughts. Explored goals for the session. Provided psychoeducation about mindfulness.  Practiced and processed mindfulness. Normalized patient's experience with mindfulness. Provided psychoeducation about defusion. Attempted to implement defusion. Used socratic questions to assist the patient. Used nonverbal communication to assist the patient. Normalized patient's therapy experience. Validated experience related to not doing what he once was able to do. Assigned homework. Assessed for suicidal and homicidal ideation.   Homework: Write out what he wants to express and address in therapy  Next Session: Review homework and emotional support  Diagnosis: F33.1 major depressive affective disorder, recurrent, moderate and F41.1 generalized anxiety disorder  Plan:   Goals Alleviate depressive symptoms Recognize, accept, and cope with depressive feelings Develop healthy thinking patterns Develop healthy interpersonal relationships Reduce overall frequency, intensity, and duration of anxiety Stabilize anxiety level wile increasing ability to function Enhance ability to effectively cope with full variety of stressors Learn and implement coping skills that result in a reduction of anxiety   Objectives target date for all objectives is 05/30/2023 Verbalize an understanding of the cognitive, physiological, and behavioral components of anxiety Learning and implement calming skills to reduce overall anxiety Verbalize an understanding of the role that cognitive biases play in excessive irrational worry and persistent anxiety symptoms Identify, challenge, and replace based fearful talk Learn and implement problem solving strategies Identify and engage in pleasant activities Learning and implement personal and interpersonal skills to reduce anxiety and improve interpersonal relationships Learn to accept limitations in life and commit to tolerating, rather than avoiding, unpleasant emotions while accomplishing meaningful goals Identify major life conflicts from the past and present that form  the basis for present anxiety Maintain involvement in work, family, and social activities Reestablish a consistent sleep-wake cycle Cooperate with a medical evaluation  Cooperate with a  medication evaluation by a physician Verbalize an accurate understanding of depression Verbalize an understanding of the treatment Identify and replace thoughts that support depression Learn and implement behavioral strategies Verbalize an understanding and resolution of current interpersonal problems Learn and implement problem solving and decision making skills Learn and implement conflict resolution skills to resolve interpersonal problems Verbalize an understanding of healthy and unhealthy emotions verbalize insight into how past relationships may be influence current experiences with depression Use mindfulness and acceptance strategies and increase value based behavior  Increase hopeful statements about the future.  Interventions Engage the patient in behavioral activation Use instruction, modeling, and role-playing to build the client's general social, communication, and/or conflict resolution skills Use Acceptance and Commitment Therapy to help client accept uncomfortable realities in order to accomplish value-consistent goals Reinforce the client's insight into the role of his/her past emotional pain and present anxiety  Support the client in following through with work, family, and social activities Teach and implement sleep hygiene practices  Refer the patient to a physician for a psychotropic medication consultation Monito the clint's psychotropic medication compliance Discuss how anxiety typically involves excessive worry, various bodily expressions of tension, and avoidance of what is threatening that interact to maintain the problem  Teach the patient relaxation skills Assign the patient homework Discuss examples demonstrating that unrealistic worry overestimates the probability of threats and  underestimates patient's ability  Assist the patient in analyzing his or her worries Help patient understand that avoidance is reinforcing  Consistent with treatment model, discuss how change in cognitive, behavioral, and interpersonal can help client alleviate depression CBT Behavioral activation help the client explore the relationship, nature of the dispute,  Help the client develop new interpersonal skills and relationships Conduct Problem solving therapy Teach conflict resolution skills Use a process-experiential approach Conduct TLDP Conduct ACT Evaluate need for psychotropic medication Monitor adherence to medication   The patient and clinician reviewed the treatment plan on 06/20/2022. The patient approved of the treatment plan.  Conception Chancy, PsyD

## 2022-06-27 ENCOUNTER — Ambulatory Visit (INDEPENDENT_AMBULATORY_CARE_PROVIDER_SITE_OTHER): Payer: 59 | Admitting: Psychologist

## 2022-06-27 DIAGNOSIS — F331 Major depressive disorder, recurrent, moderate: Secondary | ICD-10-CM

## 2022-06-27 DIAGNOSIS — F411 Generalized anxiety disorder: Secondary | ICD-10-CM

## 2022-06-27 DIAGNOSIS — R69 Illness, unspecified: Secondary | ICD-10-CM | POA: Diagnosis not present

## 2022-06-27 NOTE — Progress Notes (Signed)
[place] Counselor/Therapist Progress Note  Patient ID: George Proctor, MRN: [id number],    Date: 06/27/2022  Time Spent: 10:05 am to 10:43 am; total time: 38 minutes   This session was held via in person. The patient consented to in-person therapy and was in the clinician's office. Limits of confidentiality were discussed with the patient.   Treatment Type: Individual Therapy  Reported Symptoms: Depression and anxiety  Mental Status Exam: Appearance:  Well Groomed     Behavior: Appropriate  Motor: Normal  Speech/Language:  Clear and Coherent  Affect: Appropriate  Mood: normal  Thought process: normal  Thought content:   WNL  Sensory/Perceptual disturbances:   WNL  Orientation: oriented to person, place, and time/date  Attention: Good  Concentration: Good  Memory: WNL  Fund of knowledge:  Good  Insight:   Fair  Judgment:  Good  Impulse Control: Good   Risk Assessment: Danger to Self:  No Self-injurious Behavior: No Danger to Others: No Duty to Warn:no Physical Aggression / Violence:No  Access to Firearms a concern: No  Gang Involvement:No   Subjective: Beginning the session, patient described himself as fair while talking about his aunt visiting for the weekend. From there, he began exploring whether or not would he benefit from ketamine for depression. He also explored other medical options. Continuing to talk, he also voiced experiencing conflict with his wife and feeling like he has to make sacrifices for her. 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 He was agreeable to following up. He denied suicidal and homicidal ideation.    Interventions:  Worked on developing a therapeutic relationship with the patient using active listening and reflective statements. Provided emotional support using empathy and validation. Reviewed the treatment plan with the patient. Used summary  and reflective statements. Processed emotions regarding family visiting for the weekend. Identified goals for the session. Briefly provided psychoeducation about ketamine and other medical treatment options for depression. Challenged some of the thoughts expressed. Began to explore some of the etiology of the depression. Specifically, processed some of the depression related to his marriage. Reflected on whether or not marital counseling would be helpful. Validated patient's experience. 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 Discussed next options for counseling. Assigned homework. Assessed for suicidal and homicidal ideation.   Homework: Contact [facility] about ketamine treatment option  Next Session: Review homework and emotional support  Diagnosis: F33.1 major depressive affective disorder, recurrent, moderate and F41.1 generalized anxiety disorder  Plan:   Goals Alleviate depressive symptoms Recognize, accept, and cope with depressive feelings Develop healthy thinking patterns Develop healthy interpersonal relationships Reduce overall frequency, intensity, and duration of anxiety Stabilize anxiety level wile increasing ability to function Enhance ability to effectively cope with full variety of stressors Learn and implement coping skills that result in a reduction of anxiety   Objectives target date for all objectives is 05/30/2023 Verbalize an understanding of the cognitive, physiological, and behavioral components of anxiety Learning and implement calming skills to reduce overall anxiety Verbalize an understanding of the role that cognitive biases play in excessive irrational worry and persistent anxiety symptoms Identify, challenge, and replace based fearful talk Learn and implement problem solving strategies Identify and engage in pleasant activities Learning and implement personal and interpersonal skills to reduce anxiety and improve  interpersonal relationships Learn to accept limitations in life and commit to tolerating, rather than avoiding,  unpleasant emotions while accomplishing meaningful goals Identify major life conflicts from the past and present that form the basis for present anxiety Maintain involvement in work, family, and social activities Reestablish a consistent sleep-wake cycle Cooperate with a medical evaluation  Cooperate with a medication evaluation by a physician Verbalize an accurate understanding of depression Verbalize an understanding of the treatment Identify and replace thoughts that support depression Learn and implement behavioral strategies Verbalize an understanding and resolution of current interpersonal problems Learn and implement problem solving and decision making skills Learn and implement conflict resolution skills to resolve interpersonal problems Verbalize an understanding of healthy and unhealthy emotions verbalize insight into how past relationships may be influence current experiences with depression Use mindfulness and acceptance strategies and increase value based behavior  Increase hopeful statements about the future.  Interventions Engage the patient in behavioral activation Use instruction, modeling, and role-playing to build the client's general social, communication, and/or conflict resolution skills Use Acceptance and Commitment Therapy to help client accept uncomfortable realities in order to accomplish value-consistent goals Reinforce the client's insight into the role of his/her past emotional pain and present anxiety  Support the client in following through with work, family, and social activities Teach and implement sleep hygiene practices  Refer the patient to a physician for a psychotropic medication consultation Monito the clint's psychotropic medication compliance Discuss how anxiety typically involves excessive worry, various bodily expressions of tension, and  avoidance of what is threatening that interact to maintain the problem  Teach the patient relaxation skills Assign the patient homework Discuss examples demonstrating that unrealistic worry overestimates the probability of threats and underestimates patient's ability  Assist the patient in analyzing his or her worries Help patient understand that avoidance is reinforcing  Consistent with treatment model, discuss how change in cognitive, behavioral, and interpersonal can help client alleviate depression CBT Behavioral activation help the client explore the relationship, nature of the dispute,  Help the client develop new interpersonal skills and relationships Conduct Problem solving therapy Teach conflict resolution skills Use a process-experiential approach Conduct TLDP Conduct ACT Evaluate need for psychotropic medication Monitor adherence to medication   The patient and clinician reviewed the treatment plan on 06/20/2022. The patient approved of the treatment plan.  Conception Chancy, PsyD

## 2022-07-17 ENCOUNTER — Other Ambulatory Visit (HOSPITAL_BASED_OUTPATIENT_CLINIC_OR_DEPARTMENT_OTHER): Payer: Self-pay

## 2022-07-17 ENCOUNTER — Ambulatory Visit: Payer: 59 | Admitting: Psychologist

## 2022-07-17 DIAGNOSIS — F331 Major depressive disorder, recurrent, moderate: Secondary | ICD-10-CM | POA: Diagnosis not present

## 2022-07-17 DIAGNOSIS — R69 Illness, unspecified: Secondary | ICD-10-CM | POA: Diagnosis not present

## 2022-07-17 DIAGNOSIS — F411 Generalized anxiety disorder: Secondary | ICD-10-CM | POA: Diagnosis not present

## 2022-07-17 MED ORDER — INFLUENZA VAC SPLIT QUAD 0.5 ML IM SUSY
PREFILLED_SYRINGE | INTRAMUSCULAR | 0 refills | Status: DC
  Filled 2022-07-17: qty 0.5, 1d supply, fill #0

## 2022-07-17 MED ORDER — COMIRNATY 30 MCG/0.3ML IM SUSY
PREFILLED_SYRINGE | INTRAMUSCULAR | 0 refills | Status: DC
Start: 2022-07-17 — End: 2023-08-08
  Filled 2022-07-17: qty 0.3, 1d supply, fill #0

## 2022-07-17 NOTE — Progress Notes (Signed)
[facility] Counselor/Therapist Progress Note  Patient ID: George Proctor, MRN: [id number],    Date: 07/17/2022  Time Spent: 11:05 am to 11:45 am; total time: 40 minutes   This session was held via in person. The patient consented to in-person therapy and was in the clinician's office. Limits of confidentiality were discussed with the patient.   Treatment Type: Individual Therapy  Reported Symptoms: Depression and anxiety  Mental Status Exam: Appearance:  Well Groomed     Behavior: Appropriate  Motor: Normal  Speech/Language:  Clear and Coherent  Affect: Appropriate  Mood: normal  Thought process: normal  Thought content:   WNL  Sensory/Perceptual disturbances:   WNL  Orientation: oriented to person, place, and time/date  Attention: Good  Concentration: Good  Memory: WNL  Fund of knowledge:  Good  Insight:   Fair  Judgment:  Good  Impulse Control: Good   Risk Assessment: Danger to Self:  No Self-injurious Behavior: No Danger to Others: No Duty to Warn:no Physical Aggression / Violence:No  Access to Firearms a concern: No  Gang Involvement:No   Subjective: Beginning the session, patient described himself as okay while reflecting on events since the last session. Continuing to talk, patient asked questions regarding referral for ADHD testing. He then spent time reflecting on being bullied at work. He then spent time reflecting on seeing a psychiatrist regarding psychiatric medication. He processed thoughts and emotions. He was agreeable to the plan discussed. He denied suicidal and homicidal ideation.    Interventions:  Worked on developing a therapeutic relationship with the patient using active listening and reflective statements. Provided emotional support using empathy and validation. Reviewed the treatment plan with the patient. Used summary and reflective statements. Reviewed events since the last session. Explored barriers to completing homework.  Assisted in problem solving. Explored and identified goals for the session. Processed thoughts and emotions. Provided empathic statements. Normalized patient's responses to interventions. Encouraged patient to consider psychotropic medication. Provided psychoeducation regarding why it may be beneficial for the patient. Discussed putting in referrals for the patient. Provided empathic statements. Assessed for suicidal and homicidal ideation.   Homework: Follow through with referral to [facility]  Next Session: NA  Diagnosis: F33.1 major depressive affective disorder, recurrent, moderate and F41.1 generalized anxiety disorder  Plan:   Goals Alleviate depressive symptoms Recognize, accept, and cope with depressive feelings Develop healthy thinking patterns Develop healthy interpersonal relationships Reduce overall frequency, intensity, and duration of anxiety Stabilize anxiety level wile increasing ability to function Enhance ability to effectively cope with full variety of stressors Learn and implement coping skills that result in a reduction of anxiety   Objectives target date for all objectives is 05/30/2023 Verbalize an understanding of the cognitive, physiological, and behavioral components of anxiety Learning and implement calming skills to reduce overall anxiety Verbalize an understanding of the role that cognitive biases play in excessive irrational worry and persistent anxiety symptoms Identify, challenge, and replace based fearful talk Learn and implement problem solving strategies Identify and engage in pleasant activities Learning and implement personal and interpersonal skills to reduce anxiety and improve interpersonal relationships Learn to accept limitations in life and commit to tolerating, rather than avoiding, unpleasant emotions while accomplishing meaningful goals Identify major life conflicts from the past and present that form the basis for present  anxiety Maintain involvement in work, family, and social activities Reestablish a consistent sleep-wake cycle Cooperate with a medical evaluation  Cooperate with a medication evaluation by a physician Verbalize an  accurate understanding of depression Verbalize an understanding of the treatment Identify and replace thoughts that support depression Learn and implement behavioral strategies Verbalize an understanding and resolution of current interpersonal problems Learn and implement problem solving and decision making skills Learn and implement conflict resolution skills to resolve interpersonal problems Verbalize an understanding of healthy and unhealthy emotions verbalize insight into how past relationships may be influence current experiences with depression Use mindfulness and acceptance strategies and increase value based behavior  Increase hopeful statements about the future.  Interventions Engage the patient in behavioral activation Use instruction, modeling, and role-playing to build the client's general social, communication, and/or conflict resolution skills Use Acceptance and Commitment Therapy to help client accept uncomfortable realities in order to accomplish value-consistent goals Reinforce the client's insight into the role of his/her past emotional pain and present anxiety  Support the client in following through with work, family, and social activities Teach and implement sleep hygiene practices  Refer the patient to a physician for a psychotropic medication consultation Monito the clint's psychotropic medication compliance Discuss how anxiety typically involves excessive worry, various bodily expressions of tension, and avoidance of what is threatening that interact to maintain the problem  Teach the patient relaxation skills Assign the patient homework Discuss examples demonstrating that unrealistic worry overestimates the probability of threats and underestimates  patient's ability  Assist the patient in analyzing his or her worries Help patient understand that avoidance is reinforcing  Consistent with treatment model, discuss how change in cognitive, behavioral, and interpersonal can help client alleviate depression CBT Behavioral activation help the client explore the relationship, nature of the dispute,  Help the client develop new interpersonal skills and relationships Conduct Problem solving therapy Teach conflict resolution skills Use a process-experiential approach Conduct TLDP Conduct ACT Evaluate need for psychotropic medication Monitor adherence to medication   The patient and clinician reviewed the treatment plan on 06/20/2022. The patient approved of the treatment plan.  Conception Chancy, PsyD

## 2022-11-21 ENCOUNTER — Other Ambulatory Visit: Payer: Self-pay

## 2022-11-21 DIAGNOSIS — C8248 Follicular lymphoma grade IIIb, lymph nodes of multiple sites: Secondary | ICD-10-CM

## 2022-11-22 ENCOUNTER — Inpatient Hospital Stay: Payer: 59 | Attending: Hematology | Admitting: Hematology

## 2022-11-22 ENCOUNTER — Inpatient Hospital Stay: Payer: 59

## 2022-11-22 ENCOUNTER — Other Ambulatory Visit: Payer: Self-pay

## 2022-11-22 VITALS — BP 108/83 | HR 88 | Temp 97.5°F | Resp 16 | Wt 181.2 lb

## 2022-11-22 DIAGNOSIS — C8248 Follicular lymphoma grade IIIb, lymph nodes of multiple sites: Secondary | ICD-10-CM

## 2022-11-22 DIAGNOSIS — F32A Depression, unspecified: Secondary | ICD-10-CM | POA: Insufficient documentation

## 2022-11-22 DIAGNOSIS — F419 Anxiety disorder, unspecified: Secondary | ICD-10-CM | POA: Diagnosis not present

## 2022-11-22 DIAGNOSIS — C8220 Follicular lymphoma grade III, unspecified, unspecified site: Secondary | ICD-10-CM | POA: Insufficient documentation

## 2022-11-22 LAB — CBC WITH DIFFERENTIAL (CANCER CENTER ONLY)
Abs Immature Granulocytes: 0.02 10*3/uL (ref 0.00–0.07)
Basophils Absolute: 0.1 10*3/uL (ref 0.0–0.1)
Basophils Relative: 1 %
Eosinophils Absolute: 0.2 10*3/uL (ref 0.0–0.5)
Eosinophils Relative: 5 %
HCT: 43.4 % (ref 39.0–52.0)
Hemoglobin: 14.8 g/dL (ref 13.0–17.0)
Immature Granulocytes: 0 %
Lymphocytes Relative: 23 %
Lymphs Abs: 1.2 10*3/uL (ref 0.7–4.0)
MCH: 30.6 pg (ref 26.0–34.0)
MCHC: 34.1 g/dL (ref 30.0–36.0)
MCV: 89.9 fL (ref 80.0–100.0)
Monocytes Absolute: 0.5 10*3/uL (ref 0.1–1.0)
Monocytes Relative: 10 %
Neutro Abs: 3.1 10*3/uL (ref 1.7–7.7)
Neutrophils Relative %: 61 %
Platelet Count: 178 10*3/uL (ref 150–400)
RBC: 4.83 MIL/uL (ref 4.22–5.81)
RDW: 13.6 % (ref 11.5–15.5)
WBC Count: 5.1 10*3/uL (ref 4.0–10.5)
nRBC: 0 % (ref 0.0–0.2)

## 2022-11-22 LAB — CMP (CANCER CENTER ONLY)
ALT: 21 U/L (ref 0–44)
AST: 16 U/L (ref 15–41)
Albumin: 4.6 g/dL (ref 3.5–5.0)
Alkaline Phosphatase: 79 U/L (ref 38–126)
Anion gap: 5 (ref 5–15)
BUN: 16 mg/dL (ref 8–23)
CO2: 32 mmol/L (ref 22–32)
Calcium: 9.9 mg/dL (ref 8.9–10.3)
Chloride: 104 mmol/L (ref 98–111)
Creatinine: 0.94 mg/dL (ref 0.61–1.24)
GFR, Estimated: >60 mL/min (ref >60)
Glucose, Bld: 89 mg/dL (ref 70–99)
Potassium: 4.4 mmol/L (ref 3.5–5.1)
Sodium: 141 mmol/L (ref 135–145)
Total Bilirubin: 0.5 mg/dL (ref 0.3–1.2)
Total Protein: 6.5 g/dL (ref 6.5–8.1)

## 2022-11-22 LAB — LACTATE DEHYDROGENASE: LDH: 139 U/L (ref 98–192)

## 2022-11-22 NOTE — Progress Notes (Signed)
HEMATOLOGY/ONCOLOGY CLINIC NOTE  Date of Service: 11/22/22    Patient Care Team: [name], MD as PCP - General (Internal Medicine) Debara Pickett Nadean Corwin, MD as PCP - Cardiology (Cardiology) [name], MD (Internal Medicine)  CHIEF COMPLAINTS/PURPOSE OF CONSULTATION:  Continued evaluation and management of high-grade follicular lymphoma  HISTORY OF PRESENTING ILLNESS:  Please see previous note for details of initial presentation  INTERVAL HISTORY: George Proctor is a wonderful 67 y.o. male who is here for continued evaluation and management of his high-grade follicular lymphoma. Patient was last seen by me on 05/22/2022 and was doing well overall.  Today, he is accompanied by his wife. He does complain of weakness which causes difficulty standing. He also has some soreness and is not active on a regular basis. Patient has not received the RSV vaccination, but has received the COVID-19 booster, Shingrix and influenza. He reports that he did experience palpitations a few years ago, but has not recently.  He does report that he sleeps for about 6 hours before frequent spontaneously awakens around 3-4AM during nights. He does not feel refreshed when awakening. He does not frequently have an urge to urinate when waking up.  He denies any unexpected fever, chills, night sweats, unexpected weight loss, testicular pain/swelling, new skin rashes, leg swelling, new lumps/bumps, abdominal pain, spine pain, or change in bowel habits. He has not had any major medication changes.   MEDICAL HISTORY:  Past Medical History:  Diagnosis Date   Anxiety    Depression    Gait difficulty    Lymphoma ([place]) dx'd 05/2020   Memory loss    SURGICAL HISTORY: Past Surgical History:  Procedure Laterality Date   ABDOMINAL SURGERY     Childhood   BUNIONECTOMY     IR IMAGING GUIDED PORT INSERTION  06/30/2020   IR PARACENTESIS  06/26/2020   IR REMOVAL TUN ACCESS W/ PORT W/O FL MOD SED  05/17/2021   IR US  GUIDE BX ASP/DRAIN  06/26/2020    SOCIAL HISTORY: Social History   Socioeconomic History   Marital status: Married    Spouse name: Not on file   Number of children: 0   Years of education: [employer]   Highest education level: Not on file  Occupational History   Occupation: Scientist, research (physical sciences)  Tobacco Use   Smoking status: Never   Smokeless tobacco: Never  Vaping Use   Vaping Use: Never used  Substance and Sexual Activity   Alcohol use: No    Alcohol/week: 0.0 standard drinks of alcohol    Comment: Less than one drink per week.   Drug use: No   Sexual activity: Not Currently  Other Topics Concern   Not on file  Social History Narrative   Lives at home with wife.   Right-handed.   3 cups caffeine per day.   Social Determinants of Health   Financial Resource Strain: Not on file  Food Insecurity: Not on file  Transportation Needs: Not on file  Physical Activity: Not on file  Stress: Not on file  Social Connections: Not on file  Intimate Partner Violence: Not on file    FAMILY HISTORY: Family History  Problem Relation Age of Onset   Aneurysm Mother 52       Thoracic   Hypertension Father    Heart failure Father    Heart disease Father    Schizophrenia Maternal Uncle    Heart attack Paternal Grandfather    Depression Cousin  Bipolar disorder Cousin    Sleep apnea Neg Hx     ALLERGIES:  is allergic to crestor [rosuvastatin] and omeprazole.  MEDICATIONS:  Current Outpatient Medications  Medication Sig Dispense Refill   Ascorbic Acid (VITAMIN C) 1000 MG tablet Take 1,000 mg by mouth daily.     B Complex Vitamins (VITAMIN B COMPLEX PO) Take 1 tablet by mouth daily.      COVID-19 mRNA vaccine 2023-2024 (COMIRNATY) syringe Inject into the muscle. 0.3 mL 0   influenza vac split quadrivalent PF (FLUARIX) 0.5 ML injection Inject into the muscle. 0.5 mL 0   Multiple Vitamin (MULTIVITAMIN) capsule Take 1 capsule by mouth daily.     VITAMIN D PO  Take by mouth.     zolpidem (AMBIEN CR) 12.5 MG CR tablet TAKE 1 TABLET BY MOUTH AT BEDTIME AS NEEDED FOR SLEEP. 30 tablet 0   No current facility-administered medications for this visit.    REVIEW OF SYSTEMS:    10 Point review of Systems was done is negative except as noted above.   PHYSICAL EXAMINATION: ECOG PERFORMANCE STATUS: 2 - Symptomatic, <50% confined to bed  . There were no vitals filed for this visit.   There were no vitals filed for this visit.  . Wt Readings from Last 3 Encounters:  05/22/22 176 lb 4 oz (79.9 kg)  12/03/21 176 lb 3.2 oz (79.9 kg)  11/19/21 171 lb (77.6 kg)    .There is no height or weight on file to calculate BMI.  GENERAL:alert, in no acute distress and comfortable SKIN: no acute rashes, no significant lesions EYES: conjunctiva are pink and non-injected, sclera anicteric OROPHARYNX: MMM, no exudates, no oropharyngeal erythema or ulceration NECK: supple, no JVD LYMPH:  no palpable lymphadenopathy in the cervical, axillary or inguinal regions LUNGS: clear to auscultation b/l with normal respiratory effort HEART: regular rate & rhythm ABDOMEN:  normoactive bowel sounds , non tender, not distended. Extremity: no pedal edema PSYCH: alert & oriented x 3 with fluent speech NEURO: no focal motor/sensory deficits   LABORATORY DATA:  I have reviewed the data as listed  .    Latest Ref Rng & Units 05/22/2022    9:10 AM 11/19/2021    8:56 AM 07/23/2021    8:39 AM  CBC  WBC 4.0 - 10.5 K/uL 4.9  5.3  3.5   Hemoglobin 13.0 - 17.0 g/dL 13.9  14.0  14.0   Hematocrit 39.0 - 52.0 % 40.9  42.2  42.3   Platelets 150 - 400 K/uL 170  179  187     .    Latest Ref Rng & Units 05/22/2022    9:10 AM 11/19/2021    8:56 AM 07/23/2021    8:39 AM  CMP  Glucose 70 - 99 mg/dL 115  91  82   BUN 8 - 23 mg/dL '19  13  13   '$ Creatinine 0.61 - 1.24 mg/dL 0.92  0.91  1.01   Sodium 135 - 145 mmol/L 140  141  143   Potassium 3.5 - 5.1 mmol/L 3.9  4.2  4.4   Chloride 98  - 111 mmol/L 106  108  108   CO2 22 - 32 mmol/L '28  28  26   '$ Calcium 8.9 - 10.3 mg/dL 9.7  9.4  8.8   Total Protein 6.5 - 8.1 g/dL 6.4  6.3  6.0   Total Bilirubin 0.3 - 1.2 mg/dL 0.5  0.5  0.4   Alkaline Phos 38 - 126  U/L 76  69  79   AST 15 - 41 U/L '18  18  17   '$ ALT 0 - 44 U/L '23  21  18    '$ . Lab Results  Component Value Date   LDH 137 05/22/2022    RADIOGRAPHIC STUDIES: I have personally reviewed the radiological images as listed and agreed with the findings in the report. No results found.      ASSESSMENT & PLAN:   67 yo with   1) Stage III/IV high grade follicular lymphoma-in complete remission S/p 6 cycles of R-CHOP with CR  PLAN:  -Discussed lab results on 11/22/2022 with patient. CBC showed WBC of 5.1K, hemoglobin of 14.8, and platelets of 178K. -CBC normal -Recommend patient to receive the RSV vaccination and to remain UTD with pneumonia vaccination -Informed patient that there is typically muscle loss with age, recommend a physical exercise program that strengthens muscles -Recommend patient to optimize exercise and diet as much as possible -no clinical evidence or concern for progression of  lymphoma at this time -recommend patient to limit caffeine intake, and  increase relaxation through activities such as meditation and yoga to improve sleep patterns -patient receives at least 6 hours of nightly sleep, his sleeping patterns are not significantly concerning at this time -Answered patient's questions and informed him that his that previous heart palpitations would not usually related to lymphoma -Recommend patient to have palpitations evaluated by PCP if symptoms are persistent or recurrent   2) anxiety and depression.  This has been chronic and was accentuated during his treatment for lymphoma and this continues to be persistent. -He has been referred to Dr. Elias Else for further evaluation and management since this is affecting his quality of  life.   FOLLOW-UP: Return to clinic with Dr. [name] with labs in 6 months  The total time spent in the appointment was *** minutes* .  All of the patient's questions were answered with apparent satisfaction. The patient knows to call the clinic with any problems, questions or concerns.   Sullivan Lone MD MS AAHIVMS [facility] Hematology/Oncology Physician [facility]  .*Total Encounter Time as defined by the Centers for Medicare and Medicaid Services includes, in addition to the face-to-face time of a patient visit (documented in the note above) non-face-to-face time: obtaining and reviewing outside history, ordering and reviewing medications, tests or procedures, care coordination (communications with other health care professionals or caregivers) and documentation in the medical record.   I,Mitra Faeizi,acting as a Education administrator for Sullivan Lone, MD.,have documented all relevant documentation on the behalf of Sullivan Lone, MD,as directed by  Sullivan Lone, MD while in the presence of Sullivan Lone, MD.  ***

## 2022-11-25 ENCOUNTER — Telehealth: Payer: Self-pay | Admitting: Hematology

## 2022-11-25 NOTE — Telephone Encounter (Signed)
Called patient per 3/8 los notes to schedule f/u. Patient scheduled and notified.

## 2023-01-29 DIAGNOSIS — F332 Major depressive disorder, recurrent severe without psychotic features: Secondary | ICD-10-CM | POA: Diagnosis not present

## 2023-03-14 DIAGNOSIS — F5104 Psychophysiologic insomnia: Secondary | ICD-10-CM | POA: Diagnosis not present

## 2023-03-14 DIAGNOSIS — F339 Major depressive disorder, recurrent, unspecified: Secondary | ICD-10-CM | POA: Diagnosis not present

## 2023-04-10 DIAGNOSIS — Z0000 Encounter for general adult medical examination without abnormal findings: Secondary | ICD-10-CM | POA: Diagnosis not present

## 2023-04-10 DIAGNOSIS — Z125 Encounter for screening for malignant neoplasm of prostate: Secondary | ICD-10-CM | POA: Diagnosis not present

## 2023-04-14 DIAGNOSIS — I2510 Atherosclerotic heart disease of native coronary artery without angina pectoris: Secondary | ICD-10-CM | POA: Diagnosis not present

## 2023-04-14 DIAGNOSIS — I723 Aneurysm of iliac artery: Secondary | ICD-10-CM | POA: Diagnosis not present

## 2023-04-14 DIAGNOSIS — K219 Gastro-esophageal reflux disease without esophagitis: Secondary | ICD-10-CM | POA: Diagnosis not present

## 2023-04-14 DIAGNOSIS — K429 Umbilical hernia without obstruction or gangrene: Secondary | ICD-10-CM | POA: Diagnosis not present

## 2023-04-14 DIAGNOSIS — F339 Major depressive disorder, recurrent, unspecified: Secondary | ICD-10-CM | POA: Diagnosis not present

## 2023-04-14 DIAGNOSIS — I700 Atherosclerosis of aorta: Secondary | ICD-10-CM | POA: Diagnosis not present

## 2023-04-14 DIAGNOSIS — Z0000 Encounter for general adult medical examination without abnormal findings: Secondary | ICD-10-CM | POA: Diagnosis not present

## 2023-04-14 DIAGNOSIS — E291 Testicular hypofunction: Secondary | ICD-10-CM | POA: Diagnosis not present

## 2023-04-14 DIAGNOSIS — G4733 Obstructive sleep apnea (adult) (pediatric): Secondary | ICD-10-CM | POA: Diagnosis not present

## 2023-04-14 DIAGNOSIS — C8248 Follicular lymphoma grade IIIb, lymph nodes of multiple sites: Secondary | ICD-10-CM | POA: Diagnosis not present

## 2023-04-14 DIAGNOSIS — F5104 Psychophysiologic insomnia: Secondary | ICD-10-CM | POA: Diagnosis not present

## 2023-04-15 ENCOUNTER — Encounter: Payer: Self-pay | Admitting: Internal Medicine

## 2023-04-21 DIAGNOSIS — K219 Gastro-esophageal reflux disease without esophagitis: Secondary | ICD-10-CM | POA: Diagnosis not present

## 2023-04-21 DIAGNOSIS — R1310 Dysphagia, unspecified: Secondary | ICD-10-CM | POA: Diagnosis not present

## 2023-05-02 DIAGNOSIS — K429 Umbilical hernia without obstruction or gangrene: Secondary | ICD-10-CM | POA: Diagnosis not present

## 2023-05-07 DIAGNOSIS — K219 Gastro-esophageal reflux disease without esophagitis: Secondary | ICD-10-CM | POA: Diagnosis not present

## 2023-05-07 DIAGNOSIS — E7800 Pure hypercholesterolemia, unspecified: Secondary | ICD-10-CM | POA: Diagnosis not present

## 2023-05-07 DIAGNOSIS — R7309 Other abnormal glucose: Secondary | ICD-10-CM | POA: Diagnosis not present

## 2023-05-21 ENCOUNTER — Encounter: Payer: Self-pay | Admitting: Hematology

## 2023-05-23 ENCOUNTER — Other Ambulatory Visit: Payer: Self-pay

## 2023-05-23 DIAGNOSIS — C8248 Follicular lymphoma grade IIIb, lymph nodes of multiple sites: Secondary | ICD-10-CM

## 2023-05-26 ENCOUNTER — Inpatient Hospital Stay: Payer: PPO | Admitting: Hematology

## 2023-05-26 ENCOUNTER — Other Ambulatory Visit: Payer: Self-pay

## 2023-05-26 ENCOUNTER — Inpatient Hospital Stay: Payer: PPO | Attending: Hematology

## 2023-05-26 VITALS — BP 117/75 | HR 81 | Temp 98.6°F | Resp 17 | Wt 174.9 lb

## 2023-05-26 DIAGNOSIS — F32A Depression, unspecified: Secondary | ICD-10-CM | POA: Diagnosis not present

## 2023-05-26 DIAGNOSIS — Z79899 Other long term (current) drug therapy: Secondary | ICD-10-CM | POA: Insufficient documentation

## 2023-05-26 DIAGNOSIS — C8248 Follicular lymphoma grade IIIb, lymph nodes of multiple sites: Secondary | ICD-10-CM | POA: Diagnosis not present

## 2023-05-26 DIAGNOSIS — Z8572 Personal history of non-Hodgkin lymphomas: Secondary | ICD-10-CM | POA: Insufficient documentation

## 2023-05-26 DIAGNOSIS — F419 Anxiety disorder, unspecified: Secondary | ICD-10-CM | POA: Diagnosis not present

## 2023-05-26 LAB — CBC WITH DIFFERENTIAL (CANCER CENTER ONLY)
Abs Immature Granulocytes: 0.01 10*3/uL (ref 0.00–0.07)
Basophils Absolute: 0 10*3/uL (ref 0.0–0.1)
Basophils Relative: 0 %
Eosinophils Absolute: 0.1 10*3/uL (ref 0.0–0.5)
Eosinophils Relative: 3 %
HCT: 43.3 % (ref 39.0–52.0)
Hemoglobin: 14.4 g/dL (ref 13.0–17.0)
Immature Granulocytes: 0 %
Lymphocytes Relative: 25 %
Lymphs Abs: 1.1 10*3/uL (ref 0.7–4.0)
MCH: 29.9 pg (ref 26.0–34.0)
MCHC: 33.3 g/dL (ref 30.0–36.0)
MCV: 89.8 fL (ref 80.0–100.0)
Monocytes Absolute: 0.4 10*3/uL (ref 0.1–1.0)
Monocytes Relative: 9 %
Neutro Abs: 2.9 10*3/uL (ref 1.7–7.7)
Neutrophils Relative %: 63 %
Platelet Count: 151 10*3/uL (ref 150–400)
RBC: 4.82 MIL/uL (ref 4.22–5.81)
RDW: 13.7 % (ref 11.5–15.5)
WBC Count: 4.6 10*3/uL (ref 4.0–10.5)
nRBC: 0 % (ref 0.0–0.2)

## 2023-05-26 LAB — CMP (CANCER CENTER ONLY)
ALT: 19 U/L (ref 0–44)
AST: 20 U/L (ref 15–41)
Albumin: 4.4 g/dL (ref 3.5–5.0)
Alkaline Phosphatase: 69 U/L (ref 38–126)
Anion gap: 6 (ref 5–15)
BUN: 14 mg/dL (ref 8–23)
CO2: 27 mmol/L (ref 22–32)
Calcium: 9.1 mg/dL (ref 8.9–10.3)
Chloride: 109 mmol/L (ref 98–111)
Creatinine: 0.96 mg/dL (ref 0.61–1.24)
GFR, Estimated: >60 mL/min (ref >60)
Glucose, Bld: 105 mg/dL — ABNORMAL HIGH (ref 70–99)
Potassium: 3.8 mmol/L (ref 3.5–5.1)
Sodium: 142 mmol/L (ref 135–145)
Total Bilirubin: 0.7 mg/dL (ref 0.3–1.2)
Total Protein: 6.1 g/dL — ABNORMAL LOW (ref 6.5–8.1)

## 2023-05-26 LAB — LACTATE DEHYDROGENASE: LDH: 154 U/L (ref 98–192)

## 2023-05-26 NOTE — Progress Notes (Signed)
HEMATOLOGY/ONCOLOGY CLINIC NOTE  Date of Service: 05/26/23    Patient Care Team: Merri Brunette, MD as PCP - General (Internal Medicine) Rennis Golden Lisette Abu, MD as PCP - Cardiology (Cardiology) Merri Brunette, MD (Internal Medicine)  CHIEF COMPLAINTS/PURPOSE OF CONSULTATION:  Continued evaluation and management of high-grade follicular lymphoma  HISTORY OF PRESENTING ILLNESS:  Please see previous note for details of initial presentation  INTERVAL HISTORY:  George Proctor is a wonderful 67 y.o. male who is here for continued evaluation and management of his high-grade follicular lymphoma.  Patient was last seen by me on 11/22/2022 and he complained of weakness and mild insomnia, but was doing well.   Patient notes he has been doing well overall since our last visit. He does complain of insomnia, which has not changed since our last visit. He denies any new infection issues, fever, chills, night sweats, abdominal pain, chest pain, back pain, abnormal bowel movement, fatigue, leg swelling, or new bump/lump.   He is complaint with all of his medications.    MEDICAL HISTORY:  Past Medical History:  Diagnosis Date   Anxiety    Depression    Gait difficulty    Lymphoma ([facility]) dx'd 05/2020   Memory loss    SURGICAL HISTORY: Past Surgical History:  Procedure Laterality Date   ABDOMINAL SURGERY     Childhood   BUNIONECTOMY     IR IMAGING GUIDED PORT INSERTION  06/30/2020   IR PARACENTESIS  06/26/2020   IR REMOVAL TUN ACCESS W/ PORT W/O FL MOD SED  05/17/2021   IR US GUIDE BX ASP/DRAIN  06/26/2020    SOCIAL HISTORY: Social History   Socioeconomic History   Marital status: Married    Spouse name: Not on file   Number of children: 0   Years of education: [employer]   Highest education level: Not on file  Occupational History   Occupation: Child psychotherapist  Tobacco Use   Smoking status: Never   Smokeless tobacco: Never  Vaping Use   Vaping status: Never  Used  Substance and Sexual Activity   Alcohol use: No    Alcohol/week: 0.0 standard drinks of alcohol    Comment: Less than one drink per week.   Drug use: No   Sexual activity: Not Currently  Other Topics Concern   Not on file  Social History Narrative   Lives at home with wife.   Right-handed.   3 cups caffeine per day.   Social Determinants of Health   Financial Resource Strain: Not on file  Food Insecurity: Not on file  Transportation Needs: Not on file  Physical Activity: Not on file  Stress: Not on file  Social Connections: Unknown (01/25/2022)   Received from [facility], [facility]   Social Network    Social Network: Not on file  Intimate Partner Violence: Unknown (12/17/2021)   Received from [facility], [facility]   HITS    Physically Hurt: Not on file    Insult or Talk Down To: Not on file    Threaten Physical Harm: Not on file    Scream or Curse: Not on file    FAMILY HISTORY: Family History  Problem Relation Age of Onset   Aneurysm Mother 58       Thoracic   Hypertension Father    Heart failure Father    Heart disease Father    Schizophrenia Maternal Uncle    Heart attack Paternal Grandfather    Depression Cousin  Bipolar disorder Cousin    Sleep apnea Neg Hx     ALLERGIES:  is allergic to crestor [rosuvastatin] and omeprazole.  MEDICATIONS:  Current Outpatient Medications  Medication Sig Dispense Refill   Ascorbic Acid (VITAMIN C) 1000 MG tablet Take 1,000 mg by mouth daily.     B Complex Vitamins (VITAMIN B COMPLEX PO) Take 1 tablet by mouth daily.      COVID-19 mRNA vaccine 2023-2024 (COMIRNATY) syringe Inject into the muscle. (Patient not taking: Reported on 05/26/2023) 0.3 mL 0   influenza vac split quadrivalent PF (FLUARIX) 0.5 ML injection Inject into the muscle. (Patient not taking: Reported on 05/26/2023) 0.5 mL 0   Multiple Vitamin (MULTIVITAMIN) capsule Take 1 capsule by mouth daily.     VITAMIN D PO Take by mouth.     zolpidem  (AMBIEN CR) 12.5 MG CR tablet TAKE 1 TABLET BY MOUTH AT BEDTIME AS NEEDED FOR SLEEP. 30 tablet 0   No current facility-administered medications for this visit.    REVIEW OF SYSTEMS:    10 Point review of Systems was done is negative except as noted above.   PHYSICAL EXAMINATION: ECOG PERFORMANCE STATUS: 2 - Symptomatic, <50% confined to bed  . Vitals:   05/26/23 0858  BP: 117/75  Pulse: 81  Resp: 17  Temp: 98.6 F (37 C)  SpO2: 99%     Filed Weights   05/26/23 0858  Weight: 174 lb 14.4 oz (79.3 kg)    . Wt Readings from Last 3 Encounters:  05/26/23 174 lb 14.4 oz (79.3 kg)  11/22/22 181 lb 4 oz (82.2 kg)  05/22/22 176 lb 4 oz (79.9 kg)    .Body mass index is 23.08 kg/m.  GENERAL:alert, in no acute distress and comfortable SKIN: no acute rashes, no significant lesions EYES: conjunctiva are pink and non-injected, sclera anicteric OROPHARYNX: MMM, no exudates, no oropharyngeal erythema or ulceration NECK: supple, no JVD LYMPH:  no palpable lymphadenopathy in the cervical, axillary or inguinal regions LUNGS: clear to auscultation b/l with normal respiratory effort HEART: regular rate & rhythm ABDOMEN:  normoactive bowel sounds , non tender, not distended.  Extremity: no pedal edema PSYCH: alert & oriented x 3 with fluent speech NEURO: no focal motor/sensory deficits   LABORATORY DATA:  I have reviewed the data as listed  .    Latest Ref Rng & Units 05/26/2023    8:26 AM 11/22/2022    8:39 AM 05/22/2022    9:10 AM  CBC  WBC 4.0 - 10.5 K/uL 4.6  5.1  4.9   Hemoglobin 13.0 - 17.0 g/dL 38.7  56.4  33.2   Hematocrit 39.0 - 52.0 % 43.3  43.4  40.9   Platelets 150 - 400 K/uL 151  178  170     .    Latest Ref Rng & Units 05/26/2023    8:26 AM 11/22/2022    8:39 AM 05/22/2022    9:10 AM  CMP  Glucose 70 - 99 mg/dL 951  89  884   BUN 8 - 23 mg/dL 14  16  19    Creatinine 0.61 - 1.24 mg/dL 1.66  0.63  0.16   Sodium 135 - 145 mmol/L 142  141  140   Potassium 3.5 -  5.1 mmol/L 3.8  4.4  3.9   Chloride 98 - 111 mmol/L 109  104  106   CO2 22 - 32 mmol/L 27  32  28   Calcium 8.9 - 10.3 mg/dL 9.1  9.9  9.7   Total Protein 6.5 - 8.1 g/dL 6.1  6.5  6.4   Total Bilirubin 0.3 - 1.2 mg/dL 0.7  0.5  0.5   Alkaline Phos 38 - 126 U/L 69  79  76   AST 15 - 41 U/L 20  16  18    ALT 0 - 44 U/L 19  21  23     . Lab Results  Component Value Date   LDH 154 05/26/2023   RADIOGRAPHIC STUDIES: I have personally reviewed the radiological images as listed and agreed with the findings in the report. No results found.   ASSESSMENT & PLAN:   67 yo with   1) Stage III/IV high grade follicular lymphoma-in complete remission S/p 6 cycles of R-CHOP with CR  2) anxiety and depression.  This has been chronic and was accentuated during his treatment for lymphoma and this continues to be persistent. -f/u with his psychologist   PLAN:  -Discussed lab results from today, 05/26/2023, with the patient. CBC is stable. CMP shows slightly elevated glucose level ar 105 and slightly decreased total protein level at 6.1.  LDH WNL -Recommend to receive influenza vaccine, COVID-19 Booster, RSV vaccine, and other age related vaccines.  -no labs or clinical evidence or concern for progression of  lymphoma at this time  FOLLOW-UP:  Return to clinic with Dr. Candise Che with labs in 6 months   The total time spent in the appointment was 20 minutes* .  All of the patient's questions were answered with apparent satisfaction. The patient knows to call the clinic with any problems, questions or concerns.   Wyvonnia Lora MD MS AAHIVMS [facility] Hematology/Oncology Physician [facility]  .*Total Encounter Time as defined by the Centers for Medicare and Medicaid Services includes, in addition to the face-to-face time of a patient visit (documented in the note above) non-face-to-face time: obtaining and reviewing outside history, ordering and reviewing medications, tests or procedures,  care coordination (communications with other health care professionals or caregivers) and documentation in the medical record.   I,Param Shah,acting as a Neurosurgeon for Wyvonnia Lora, MD.,have documented all relevant documentation on the behalf of Wyvonnia Lora, MD,as directed by  Wyvonnia Lora, MD while in the presence of Wyvonnia Lora, MD.  .I have reviewed the above documentation for accuracy and completeness, and I agree with the above. Johney Maine MD

## 2023-06-10 ENCOUNTER — Encounter: Payer: Self-pay | Admitting: Internal Medicine

## 2023-07-09 DIAGNOSIS — I700 Atherosclerosis of aorta: Secondary | ICD-10-CM | POA: Diagnosis not present

## 2023-08-04 ENCOUNTER — Ambulatory Visit: Payer: PPO | Admitting: Physician Assistant

## 2023-08-08 ENCOUNTER — Ambulatory Visit: Payer: PPO | Attending: Physician Assistant | Admitting: Physician Assistant

## 2023-08-08 ENCOUNTER — Encounter: Payer: Self-pay | Admitting: Physician Assistant

## 2023-08-08 VITALS — BP 122/86 | HR 92 | Ht 73.0 in | Wt 171.2 lb

## 2023-08-08 DIAGNOSIS — E785 Hyperlipidemia, unspecified: Secondary | ICD-10-CM | POA: Diagnosis not present

## 2023-08-08 DIAGNOSIS — R0789 Other chest pain: Secondary | ICD-10-CM | POA: Diagnosis not present

## 2023-08-08 DIAGNOSIS — R42 Dizziness and giddiness: Secondary | ICD-10-CM | POA: Diagnosis not present

## 2023-08-08 DIAGNOSIS — Z79899 Other long term (current) drug therapy: Secondary | ICD-10-CM

## 2023-08-08 DIAGNOSIS — R931 Abnormal findings on diagnostic imaging of heart and coronary circulation: Secondary | ICD-10-CM

## 2023-08-08 DIAGNOSIS — I351 Nonrheumatic aortic (valve) insufficiency: Secondary | ICD-10-CM

## 2023-08-08 DIAGNOSIS — I77810 Thoracic aortic ectasia: Secondary | ICD-10-CM

## 2023-08-08 DIAGNOSIS — I700 Atherosclerosis of aorta: Secondary | ICD-10-CM

## 2023-08-08 NOTE — Progress Notes (Unsigned)
  Cardiology Office Note:  .   Date:  08/08/2023  ID:  George Proctor, DOB 02-07-1956, MRN [id number] PCP: Merri Brunette, MD  [facility] Providers Cardiologist:  Chrystie Nose, MD { Click to update primary MD,subspecialty MD or APP then REFRESH:1}   History of Present Illness: .   George Proctor is a 67 y.o. male with a hx of ADD, anxiety and depression.  He has no prior history of hypertension, diabetes or hyperlipidemia.  Family history is significant for hypertension and heart disease in his father.  Previous coronary CT obtained on 10/23/2011 showed a coronary calcium score of 0, mild to moderate dilatation of the sinus of Valsalva, otherwise normal coronary arteries with right dominant system.  He was referred to cardiology service for evaluation of atypical chest pain and fatigue.  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 Echocardiogram obtained on 08/11/2019 showed EF 55 to 60%, grade 1 DD, mild to moderate aortic stenosis, moderate to dilatation of the ascending aorta measuring at 39 mm.  He was diagnosed with lymphoma in 2021.  Subsequent myoview in May 2021 was normal with normal EF.  Echocardiogram in October 2021 showed EF 65 to 70%, grade 1 DD.  Dr. Rennis Golden has been aiming for LDL goal of less than 70.  His last LDL in March 2023 was 82.     ROS: ***  Studies Reviewed: .        *** Risk Assessment/Calculations:   {Does this patient have ATRIAL FIBRILLATION?[phone]} No BP recorded.  {Refresh Note OR Click here to enter BP  :1}***       Physical Exam:   VS:  Pulse 92   Ht 6\' 1"  (1.854 m)   Wt 171 lb 3.2 oz (77.7 kg)   SpO2 95%   BMI 22.59 kg/m    Wt Readings from Last 3  Encounters:  08/08/23 171 lb 3.2 oz (77.7 kg)  05/26/23 174 lb 14.4 oz (79.3 kg)  11/22/22 181 lb 4 oz (82.2 kg)    GEN: Well nourished, well developed in no acute distress NECK: No JVD; No carotid bruits CARDIAC: ***RRR, no murmurs, rubs, gallops RESPIRATORY:  Clear to auscultation without rales, wheezing or rhonchi  ABDOMEN: Soft, non-tender, non-distended EXTREMITIES:  No edema; No deformity   ASSESSMENT AND PLAN: .   ***    {Are you ordering a CV Procedure (e.g. stress test, cath, DCCV, TEE, etc)?   Press F2        :[id number]}  Dispo: ***  Signed, Azalee Course, PA

## 2023-08-08 NOTE — Patient Instructions (Signed)
Medication Instructions:  NO CHANGES *If you need a refill on your cardiac medications before your next appointment, please call your pharmacy*   Lab Work: FASTING LIPID PANEL IN 6 MONTHS If you have labs (blood work) drawn today and your tests are completely normal, you will receive your results only by: MyChart Message (if you have MyChart) OR A paper copy in the mail If you have any lab test that is abnormal or we need to change your treatment, we will call you to review the results.   Testing/Procedures: NO TESTING   Follow-Up: At [facility], you and your health needs are our priority.  As part of our continuing mission to provide you with exceptional heart care, we have created designated Provider Care Teams.  These Care Teams include your primary Cardiologist (physician) and Advanced Practice Providers (APPs -  Physician Assistants and Nurse Practitioners) who all work together to provide you with the care you need, when you need it.  Your next appointment:   1 year(s)  Provider:   Zoila Shutter, MD

## 2023-08-12 DIAGNOSIS — K222 Esophageal obstruction: Secondary | ICD-10-CM | POA: Diagnosis not present

## 2023-08-12 DIAGNOSIS — K2950 Unspecified chronic gastritis without bleeding: Secondary | ICD-10-CM | POA: Diagnosis not present

## 2023-08-12 DIAGNOSIS — K219 Gastro-esophageal reflux disease without esophagitis: Secondary | ICD-10-CM | POA: Diagnosis not present

## 2023-08-12 DIAGNOSIS — R1310 Dysphagia, unspecified: Secondary | ICD-10-CM | POA: Diagnosis not present

## 2023-08-12 DIAGNOSIS — K2100 Gastro-esophageal reflux disease with esophagitis, without bleeding: Secondary | ICD-10-CM | POA: Diagnosis not present

## 2023-09-01 DIAGNOSIS — F4312 Post-traumatic stress disorder, chronic: Secondary | ICD-10-CM | POA: Diagnosis not present

## 2023-09-01 DIAGNOSIS — F332 Major depressive disorder, recurrent severe without psychotic features: Secondary | ICD-10-CM | POA: Diagnosis not present

## 2023-09-19 DIAGNOSIS — M25531 Pain in right wrist: Secondary | ICD-10-CM | POA: Diagnosis not present

## 2023-09-24 DIAGNOSIS — F419 Anxiety disorder, unspecified: Secondary | ICD-10-CM | POA: Diagnosis not present

## 2023-09-24 DIAGNOSIS — K219 Gastro-esophageal reflux disease without esophagitis: Secondary | ICD-10-CM | POA: Diagnosis not present

## 2023-11-17 ENCOUNTER — Ambulatory Visit: Payer: PPO | Admitting: Hematology

## 2023-11-17 ENCOUNTER — Other Ambulatory Visit: Payer: PPO

## 2023-11-28 ENCOUNTER — Other Ambulatory Visit: Payer: Self-pay

## 2023-11-28 DIAGNOSIS — C8248 Follicular lymphoma grade IIIb, lymph nodes of multiple sites: Secondary | ICD-10-CM

## 2023-12-01 ENCOUNTER — Inpatient Hospital Stay: Payer: PPO | Admitting: Hematology

## 2023-12-01 ENCOUNTER — Inpatient Hospital Stay: Payer: PPO | Attending: Internal Medicine

## 2023-12-01 VITALS — BP 120/75 | HR 80 | Temp 97.7°F | Resp 15 | Wt 170.4 lb

## 2023-12-01 DIAGNOSIS — R109 Unspecified abdominal pain: Secondary | ICD-10-CM | POA: Diagnosis not present

## 2023-12-01 DIAGNOSIS — C829A Follicular lymphoma, unspecified, in remission: Secondary | ICD-10-CM | POA: Diagnosis not present

## 2023-12-01 DIAGNOSIS — F419 Anxiety disorder, unspecified: Secondary | ICD-10-CM | POA: Insufficient documentation

## 2023-12-01 DIAGNOSIS — F32A Depression, unspecified: Secondary | ICD-10-CM | POA: Diagnosis not present

## 2023-12-01 DIAGNOSIS — C8248 Follicular lymphoma grade IIIb, lymph nodes of multiple sites: Secondary | ICD-10-CM

## 2023-12-01 LAB — CBC WITH DIFFERENTIAL (CANCER CENTER ONLY)
Abs Immature Granulocytes: 0.01 10*3/uL (ref 0.00–0.07)
Basophils Absolute: 0 10*3/uL (ref 0.0–0.1)
Basophils Relative: 1 %
Eosinophils Absolute: 0.2 10*3/uL (ref 0.0–0.5)
Eosinophils Relative: 4 %
HCT: 42.3 % (ref 39.0–52.0)
Hemoglobin: 14 g/dL (ref 13.0–17.0)
Immature Granulocytes: 0 %
Lymphocytes Relative: 33 %
Lymphs Abs: 1.5 10*3/uL (ref 0.7–4.0)
MCH: 29.4 pg (ref 26.0–34.0)
MCHC: 33.1 g/dL (ref 30.0–36.0)
MCV: 88.7 fL (ref 80.0–100.0)
Monocytes Absolute: 0.4 10*3/uL (ref 0.1–1.0)
Monocytes Relative: 8 %
Neutro Abs: 2.5 10*3/uL (ref 1.7–7.7)
Neutrophils Relative %: 54 %
Platelet Count: 183 10*3/uL (ref 150–400)
RBC: 4.77 MIL/uL (ref 4.22–5.81)
RDW: 13.9 % (ref 11.5–15.5)
WBC Count: 4.5 10*3/uL (ref 4.0–10.5)
nRBC: 0 % (ref 0.0–0.2)

## 2023-12-01 LAB — CMP (CANCER CENTER ONLY)
ALT: 19 U/L (ref 0–44)
AST: 18 U/L (ref 15–41)
Albumin: 4.4 g/dL (ref 3.5–5.0)
Alkaline Phosphatase: 68 U/L (ref 38–126)
Anion gap: 5 (ref 5–15)
BUN: 24 mg/dL — ABNORMAL HIGH (ref 8–23)
CO2: 29 mmol/L (ref 22–32)
Calcium: 9.3 mg/dL (ref 8.9–10.3)
Chloride: 108 mmol/L (ref 98–111)
Creatinine: 1.01 mg/dL (ref 0.61–1.24)
GFR, Estimated: >60 mL/min (ref >60)
Glucose, Bld: 91 mg/dL (ref 70–99)
Potassium: 4.7 mmol/L (ref 3.5–5.1)
Sodium: 142 mmol/L (ref 135–145)
Total Bilirubin: 0.5 mg/dL (ref 0.0–1.2)
Total Protein: 6.1 g/dL — ABNORMAL LOW (ref 6.5–8.1)

## 2023-12-01 LAB — LACTATE DEHYDROGENASE: LDH: 143 U/L (ref 98–192)

## 2023-12-01 NOTE — Progress Notes (Signed)
 HEMATOLOGY/ONCOLOGY CLINIC NOTE  Date of Service: 12/01/23    Patient Care Team: Merri Brunette, MD as PCP - General (Internal Medicine) Rennis Golden Lisette Abu, MD as PCP - Cardiology (Cardiology) Merri Brunette, MD (Internal Medicine)  CHIEF COMPLAINTS/PURPOSE OF CONSULTATION:  Continued evaluation and management of high-grade follicular lymphoma  HISTORY OF PRESENTING ILLNESS:  Please see previous note for details of initial presentation  INTERVAL HISTORY:  George Proctor is a wonderful 68 y.o. male who is here for continued evaluation and management of his high-grade follicular lymphoma.  Patient was last seen by me on 06/05/2024 and he complained of insomnia, but was doing well overall.  Patient notes he has been doing well overall since our last visit. He complains of persistent nausea and acid reflux. He denies abnormal bowel movement. He notes that his acid reflux symptoms are controlled with omeprazole.  Patient notes that he first noticed his nausea symptoms around one year ago. He does report upper abdominal pain when he is nauseous. Patient denies eating anything that improves or worsens his symptoms.   He denies any new infection issues, fever, chills, night sweats, unexpected weight loss, abdominal pain, back pain, chest pain, or leg swelling. He does complain of mild anxiety and depressive symptoms. Patient notes he had a mental health counselor in the past and is currently trying to find a new mental health counselor.  Patient notes he has been to a psychiatrist in the past who recommended anti-depression medication. Patient did not proceed with Anti-depression medication due to side-effects.   He also complains of chronic insomnia and fatigue. He had sleep study in the past, which did not show any abnormalities. He denies sleep apnea.  MEDICAL HISTORY:  Past Medical History:  Diagnosis Date   Anxiety    Depression    Gait difficulty    Lymphoma (HCC) dx'd 05/2020    Memory loss    SURGICAL HISTORY: Past Surgical History:  Procedure Laterality Date   ABDOMINAL SURGERY     Childhood   BUNIONECTOMY     IR IMAGING GUIDED PORT INSERTION  06/30/2020   IR PARACENTESIS  06/26/2020   IR REMOVAL TUN ACCESS W/ PORT W/O FL MOD SED  05/17/2021   IR US GUIDE BX ASP/DRAIN  06/26/2020    SOCIAL HISTORY: Social History   Socioeconomic History   Marital status: Married    Spouse name: Not on file   Number of children: 0   Years of education: [employer]   Highest education level: Not on file  Occupational History   Occupation: Child psychotherapist  Tobacco Use   Smoking status: Never   Smokeless tobacco: Never  Vaping Use   Vaping status: Never Used  Substance and Sexual Activity   Alcohol use: No    Alcohol/week: 0.0 standard drinks of alcohol    Comment: Less than one drink per week.   Drug use: No   Sexual activity: Not Currently  Other Topics Concern   Not on file  Social History Narrative   Lives at home with wife.   Right-handed.   3 cups caffeine per day.   Social Drivers of Corporate investment banker Strain: Not on file  Food Insecurity: Not on file  Transportation Needs: Not on file  Physical Activity: Not on file  Stress: Not on file  Social Connections: Unknown (01/25/2022)   Received [facility], [facility]   Social Network    Social Network: Not on file  Intimate  Partner Violence: Unknown (12/17/2021)   Received from [facility], [facility]   HITS    Physically Hurt: Not on file    Insult or Talk Down To: Not on file    Threaten Physical Harm: Not on file    Scream or Curse: Not on file    FAMILY HISTORY: Family History  Problem Relation Age of Onset   Aneurysm Mother 69       Thoracic   Hypertension Father    Heart failure Father    Heart disease Father    Schizophrenia Maternal Uncle    Heart attack Paternal Grandfather    Depression Cousin    Bipolar disorder Cousin    Sleep apnea  Neg Hx     ALLERGIES:  is allergic to crestor [rosuvastatin] and omeprazole.  MEDICATIONS:  Current Outpatient Medications  Medication Sig Dispense Refill   B Complex Vitamins (VITAMIN B COMPLEX PO) Take 1 tablet by mouth daily.      famotidine (PEPCID) 20 MG tablet Take 20 mg by mouth 2 (two) times daily.     Multiple Vitamin (MULTIVITAMIN) capsule Take 1 capsule by mouth daily.     omega-3 acid ethyl esters (LOVAZA) 1 g capsule Take by mouth.     UNABLE TO FIND Med Name: cocoavia supplement powder     VITAMIN D PO Take by mouth. With K2     zolpidem (AMBIEN CR) 6.25 MG CR tablet Take by mouth.     No current facility-administered medications for this visit.    REVIEW OF SYSTEMS:    10 Point review of Systems was done is negative except as noted above.   PHYSICAL EXAMINATION: ECOG PERFORMANCE STATUS: 2 - Symptomatic, <50% confined to bed  . Vitals:   12/01/23 1210  BP: 120/75  Pulse: 80  Resp: 15  Temp: 97.7 F (36.5 C)  SpO2: 97%   Filed Weights   12/01/23 1210  Weight: 170 lb 6.4 oz (77.3 kg)    Wt Readings from Last 3 Encounters:  08/08/23 171 lb 3.2 oz (77.7 kg)  05/26/23 174 lb 14.4 oz (79.3 kg)  11/22/22 181 lb 4 oz (82.2 kg)   .Body mass index is 22.48 kg/m.  GENERAL:alert, in no acute distress and comfortable SKIN: no acute rashes, no significant lesions EYES: conjunctiva are pink and non-injected, sclera anicteric OROPHARYNX: MMM, no exudates, no oropharyngeal erythema or ulceration NECK: supple, no JVD LYMPH:  no palpable lymphadenopathy in the cervical, axillary or inguinal regions LUNGS: clear to auscultation b/l with normal respiratory effort HEART: regular rate & rhythm ABDOMEN:  normoactive bowel sounds , non tender, not distended.  Extremity: no pedal edema PSYCH: alert & oriented x 3 with fluent speech NEURO: no focal motor/sensory deficits   LABORATORY DATA:  I have reviewed the data as listed  .    Latest Ref Rng & Units 12/01/2023    11:56 AM 05/26/2023    8:26 AM 11/22/2022    8:39 AM  CBC  WBC 4.0 - 10.5 K/uL 4.5  4.6  5.1   Hemoglobin 13.0 - 17.0 g/dL 16.1  09.6  04.5   Hematocrit 39.0 - 52.0 % 42.3  43.3  43.4   Platelets 150 - 400 K/uL 183  151  178     .    Latest Ref Rng & Units 12/01/2023   11:56 AM 05/26/2023    8:26 AM 11/22/2022    8:39 AM  CMP  Glucose 70 - 99 mg/dL 91  409  89   BUN 8 - 23 mg/dL 24  14  16    Creatinine 0.61 - 1.24 mg/dL 1.61  0.96  0.45   Sodium 135 - 145 mmol/L 142  142  141   Potassium 3.5 - 5.1 mmol/L 4.7  3.8  4.4   Chloride 98 - 111 mmol/L 108  109  104   CO2 22 - 32 mmol/L 29  27  32   Calcium 8.9 - 10.3 mg/dL 9.3  9.1  9.9   Total Protein 6.5 - 8.1 g/dL 6.1  6.1  6.5   Total Bilirubin 0.0 - 1.2 mg/dL 0.5  0.7  0.5   Alkaline Phos 38 - 126 U/L 68  69  79   AST 15 - 41 U/L 18  20  16    ALT 0 - 44 U/L 19  19  21     . Lab Results  Component Value Date   LDH 143 12/01/2023   RADIOGRAPHIC STUDIES: I have personally reviewed the radiological images as listed and agreed with the findings in the report. No results found.   ASSESSMENT & PLAN:   68 yo with   1) Stage III/IV high grade follicular lymphoma-in complete remission S/p 6 cycles of R-CHOP with CR  2) anxiety and depression.  This has been chronic and was accentuated during his treatment for lymphoma and this continues to be persistent. -f/u with his psychologist   PLAN:  -Discussed lab results from today, 12/01/2023, in detail with the patient. CBC stable. CMP wnl LDH WNL -Recommend to receive influenza vaccine, COVID-19 Booster, RSV vaccine, and other age related vaccines.  -no labs or clinical evidence or concern for progression of  lymphoma at this time -Discussed the option of CT Abdomen/Pelvis to evaluate abdominal pain and persistent nausea. Pt agrees. Scheduled for 12/11/2023 -Schedule patient for CT scan.  -Recommend to follow-up with PCP or gastroenterologist regarding persistent nausea if CT scan  does not show any abnormalities.  -Recommend to follow-up with PCP regarding anxiety and depression. Possibility of depression/anxiety medication.  -Educated the patient about meditation or exercise and other intervals to help depressive symptoms.  -Answered all of patient's questions.   FOLLOW-UP: Ct abd/pelvis in 1 week Phone visit with Dr Candise Che in 3 weeks   The total time spent in the appointment was 20 minutes* .  All of the patient's questions were answered with apparent satisfaction. The patient knows to call the clinic with any problems, questions or concerns.   Wyvonnia Lora MD MS AAHIVMS [facility] Hematology/Oncology Physician [facility]  .*Total Encounter Time as defined by the Centers for Medicare and Medicaid Services includes, in addition to the face-to-face time of a patient visit (documented in the note above) non-face-to-face time: obtaining and reviewing outside history, ordering and reviewing medications, tests or procedures, care coordination (communications with other health care professionals or caregivers) and documentation in the medical record.  I,Param Shah,acting as a Neurosurgeon for Wyvonnia Lora, MD.,have documented all relevant documentation on the behalf of Wyvonnia Lora, MD,as directed by  Wyvonnia Lora, MD while in the presence of Wyvonnia Lora, MD.  .I have reviewed the above documentation for accuracy and completeness, and I agree with the above. Johney Maine MD

## 2023-12-11 ENCOUNTER — Ambulatory Visit (HOSPITAL_COMMUNITY)

## 2023-12-11 ENCOUNTER — Encounter (HOSPITAL_COMMUNITY): Payer: Self-pay

## 2023-12-23 ENCOUNTER — Inpatient Hospital Stay: Admitting: Hematology

## 2024-01-06 ENCOUNTER — Encounter (HOSPITAL_COMMUNITY): Payer: Self-pay

## 2024-01-06 ENCOUNTER — Ambulatory Visit (HOSPITAL_COMMUNITY)
Admission: RE | Admit: 2024-01-06 | Discharge: 2024-01-06 | Disposition: A | Discharge status: Home / Self Care | Source: Ambulatory Visit | Attending: Hematology | Admitting: Hematology

## 2024-01-06 DIAGNOSIS — R109 Unspecified abdominal pain: Secondary | ICD-10-CM | POA: Insufficient documentation

## 2024-01-06 DIAGNOSIS — Z8572 Personal history of non-Hodgkin lymphomas: Secondary | ICD-10-CM | POA: Diagnosis not present

## 2024-01-06 DIAGNOSIS — K429 Umbilical hernia without obstruction or gangrene: Secondary | ICD-10-CM | POA: Diagnosis not present

## 2024-01-06 DIAGNOSIS — C8248 Follicular lymphoma grade IIIb, lymph nodes of multiple sites: Secondary | ICD-10-CM | POA: Insufficient documentation

## 2024-01-06 MED ORDER — IOHEXOL 9 MG/ML PO SOLN
ORAL | Status: AC
  Filled 2024-01-06: qty 1000

## 2024-01-06 MED ORDER — IOHEXOL 300 MG/ML  SOLN
100.0000 mL | Freq: Once | INTRAMUSCULAR | Status: AC | PRN
Start: 2024-01-06 — End: 2024-01-06
  Administered 2024-01-06: 100 mL via INTRAVENOUS

## 2024-01-06 MED ORDER — IOHEXOL 9 MG/ML PO SOLN
500.0000 mL | ORAL | Status: AC
  Administered 2024-01-06: 1000 mL via ORAL

## 2024-01-06 MED ORDER — SODIUM CHLORIDE (PF) 0.9 % IJ SOLN
INTRAMUSCULAR | Status: AC
  Filled 2024-01-06: qty 50

## 2024-01-20 ENCOUNTER — Inpatient Hospital Stay: Attending: Internal Medicine | Admitting: Hematology

## 2024-01-20 DIAGNOSIS — C829A Follicular lymphoma, unspecified, in remission: Secondary | ICD-10-CM | POA: Diagnosis not present

## 2024-01-20 DIAGNOSIS — C8248 Follicular lymphoma grade IIIb, lymph nodes of multiple sites: Secondary | ICD-10-CM | POA: Diagnosis not present

## 2024-01-21 ENCOUNTER — Telehealth: Payer: Self-pay | Admitting: Hematology

## 2024-01-21 NOTE — Telephone Encounter (Signed)
 Spoke with patient confirming upcoming appointment

## 2024-01-23 NOTE — Progress Notes (Signed)
 HEMATOLOGY/ONCOLOGY PHONE VISIT NOTE  Date of Service: 01/20/2024   Patient Care Team: Imelda Man, MD as PCP - General (Internal Medicine) Maximo Spar Aviva Lemmings, MD as PCP - Cardiology (Cardiology) Imelda Man, MD (Internal Medicine)  CHIEF COMPLAINTS/PURPOSE OF CONSULTATION:  Continued evaluation and management of high-grade follicular lymphoma  HISTORY OF PRESENTING ILLNESS:  Please see previous note for details of initial presentation  INTERVAL HISTORY:  George Proctor is a wonderful 68 y.o. male who is here for continued evaluation and management of his high-grade follicular lymphoma.  Patient was last seen by me on 12/01/2023 and complained of persistent nausea with upper abdominal pain, acid reflux, mild anxiety and depressive symptoms and chronic insomnia and fatigue.  I connected with Robbie Chiles Scalise on 01/20/2024 at  3:30 PM EDT by telephone visit and verified that I am speaking with the correct person using two identifiers.   I discussed the limitations, risks, security and privacy concerns of performing an evaluation and management service by telemedicine and the availability of in-person appointments. I also discussed with the patient that there may be a patient responsible charge related to this service. The patient expressed understanding and agreed to proceed.   Other persons participating in the visit and their role in the encounter: none   Patient's location: home  Provider's location: [facility]   Chief Complaint: high-grade follicular lymphoma.  Patient notes no significant persistent abdominal discomfort at this time.  CT abdomen pelvis results were discussed in detail with him.  MEDICAL HISTORY:  Past Medical History:  Diagnosis Date   Anxiety    Depression    Gait difficulty    Lymphoma (HCC) dx'd 05/2020   Memory loss    SURGICAL HISTORY: Past Surgical History:  Procedure Laterality Date   ABDOMINAL SURGERY     Childhood   BUNIONECTOMY     IR IMAGING  GUIDED PORT INSERTION  06/30/2020   IR PARACENTESIS  06/26/2020   IR REMOVAL TUN ACCESS W/ PORT W/O FL MOD SED  05/17/2021   IR US  GUIDE BX ASP/DRAIN  06/26/2020    SOCIAL HISTORY: Social History   Socioeconomic History   Marital status: Married    Spouse name: Not on file   Number of children: 0   Years of education: [employer]   Highest education level: Not on file  Occupational History   Occupation: Child psychotherapist  Tobacco Use   Smoking status: Never   Smokeless tobacco: Never  Vaping Use   Vaping status: Never Used  Substance and Sexual Activity   Alcohol use: No    Alcohol/week: 0.0 standard drinks of alcohol    Comment: Less than one drink per week.   Drug use: No   Sexual activity: Not Currently  Other Topics Concern   Not on file  Social History Narrative   Lives at home with wife.   Right-handed.   3 cups caffeine per day.   Social Drivers of Corporate investment banker Strain: Not on file  Food Insecurity: Not on file  Transportation Needs: Not on file  Physical Activity: Not on file  Stress: Not on file  Social Connections: Unknown (01/25/2022)   Received from [facility], [facility]   Social Network    Social Network: Not on file  Intimate Partner Violence: Unknown (12/17/2021)   Received from [facility], [facility]   HITS    Physically Hurt: Not on file    Insult or Talk Down To: Not on  file    Threaten Physical Harm: Not on file    Scream or Curse: Not on file    FAMILY HISTORY: Family History  Problem Relation Age of Onset   Aneurysm Mother 32       Thoracic   Hypertension Father    Heart failure Father    Heart disease Father    Schizophrenia Maternal Uncle    Heart attack Paternal Grandfather    Depression Cousin    Bipolar disorder Cousin    Sleep apnea Neg Hx     ALLERGIES:  is allergic to crestor [rosuvastatin] and omeprazole.  MEDICATIONS:  Current Outpatient Medications  Medication Sig Dispense  Refill   B Complex Vitamins (VITAMIN B COMPLEX PO) Take 1 tablet by mouth daily.      famotidine  (PEPCID ) 20 MG tablet Take 20 mg by mouth 2 (two) times daily.     Multiple Vitamin (MULTIVITAMIN) capsule Take 1 capsule by mouth daily.     omega-3 acid ethyl esters (LOVAZA) 1 g capsule Take by mouth.     UNABLE TO FIND Med Name: cocoavia supplement powder     VITAMIN D PO Take by mouth. With K2     zolpidem  (AMBIEN  CR) 6.25 MG CR tablet Take by mouth.     No current facility-administered medications for this visit.    REVIEW OF SYSTEMS:    10 Point review of Systems was done is negative except as noted above.   PHYSICAL EXAMINATION: TELEMEDICINE VISIT  LABORATORY DATA:  I have reviewed the data as listed  .    Latest Ref Rng & Units 12/01/2023   11:56 AM 05/26/2023    8:26 AM 11/22/2022    8:39 AM  CBC  WBC 4.0 - 10.5 K/uL 4.5  4.6  5.1   Hemoglobin 13.0 - 17.0 g/dL 34.7  42.5  95.6   Hematocrit 39.0 - 52.0 % 42.3  43.3  43.4   Platelets 150 - 400 K/uL 183  151  178     .    Latest Ref Rng & Units 12/01/2023   11:56 AM 05/26/2023    8:26 AM 11/22/2022    8:39 AM  CMP  Glucose 70 - 99 mg/dL 91  387  89   BUN 8 - 23 mg/dL 24  14  16    Creatinine 0.61 - 1.24 mg/dL 5.64  3.32  9.51   Sodium 135 - 145 mmol/L 142  142  141   Potassium 3.5 - 5.1 mmol/L 4.7  3.8  4.4   Chloride 98 - 111 mmol/L 108  109  104   CO2 22 - 32 mmol/L 29  27  32   Calcium 8.9 - 10.3 mg/dL 9.3  9.1  9.9   Total Protein 6.5 - 8.1 g/dL 6.1  6.1  6.5   Total Bilirubin 0.0 - 1.2 mg/dL 0.5  0.7  0.5   Alkaline Phos 38 - 126 U/L 68  69  79   AST 15 - 41 U/L 18  20  16    ALT 0 - 44 U/L 19  19  21     . Lab Results  Component Value Date   LDH 143 12/01/2023   RADIOGRAPHIC STUDIES: I have personally reviewed the radiological images as listed and agreed with the findings in the report. CT ABDOMEN PELVIS W CONTRAST Result Date: 01/14/2024 EXAMINATION: CT ABDOMEN PELVIS W CONTRAST CLINICAL INDICATION: Male, 68  years old. Hematologic malignancy, monitor; Patietn with h/o high grade follicular lymphoma  with vague abdominal discomfort and nausea for further evaluation. TECHNIQUE: Axial CT of the abdomen and pelvis with 100 cc Omnipaque  300 intravenous contrast. Multiplanar reformations provided. Unless otherwise specified, incidental thyroid , adrenal, renal lesions do not require dedicated imaging follow up. Additionally, any mentioned pulmonary nodules do not require dedicated imaging follow-up based on the Fleischner guidelines unless otherwise specified. Coronary calcifications are not identified unless otherwise specified. COMPARISON: 04/30/2022 FINDINGS: The lung bases are clear. The heart is normal in size. The liver appears normal. The gallbladder is normal. The spleen is normal. Pancreas is normal. Adrenals are normal. Kidneys are normal. The abdominal aorta is normal in caliber. Scattered atherosclerotic changes are present. The bladder is normal. The prostate is normal. There is colonic diverticulosis. Appendix is normal. Large and small bowel loops are otherwise within normal limits. There is no free fluid or suspicious lymphadenopathy. There is a small fat-containing umbilical hernia. No concerning osseous lesions. There are degenerative changes of the spine and bony pelvis. IMPRESSION: No concerning adenopathy within the abdomen or pelvis. DOSE REDUCTION: This exam was performed according to our departmental dose-optimization program which includes automated exposure control, adjustment of the mA and/or kV according to patient size and/or use of iterative reconstruction technique. Electronically signed by: [place] Engel MD 01/14/2024 03:53 PM EDT RP Workstation: [id number]     ASSESSMENT & PLAN:   68 yo male with   1) Stage III/IV high grade follicular lymphoma-in complete remission S/p 6 cycles of R-CHOP with CR  2) anxiety and depression.  This has been chronic and was accentuated during his treatment  for lymphoma and this continues to be persistent. -f/u with his psychologist   PLAN: -On interval follow-up by phone visit today the patient notes that his abdominal symptoms have resolved. -01/06/2024 CT abdm/pelvis scan shows No concerning adenopathy within the abdomen or pelvis with no evidence of any acute pathology or concerns for recurrence of lymphoma. - No additional workup or treatment of lymphoma indicated at this time.  Will continue lymphoma surveillance at this time.  FOLLOW-UP: RTC with Dr Salomon Cree with labs in 6 months  The total time spent in the appointment was 15 minutes* .  All of the patient's questions were answered with apparent satisfaction. The patient knows to call the clinic with any problems, questions or concerns.   Jacquelyn Matt MD MS AAHIVMS [facility] Hematology/Oncology Physician [facility]  .*Total Encounter Time as defined by the Centers for Medicare and Medicaid Services includes, in addition to the face-to-face time of a patient visit (documented in the note above) non-face-to-face time: obtaining and reviewing outside history, ordering and reviewing medications, tests or procedures, care coordination (communications with other health care professionals or caregivers) and documentation in the medical record.    I,Mitra Faeizi,acting as a Neurosurgeon for Jacquelyn Matt, MD.,have documented all relevant documentation on the behalf of Jacquelyn Matt, MD,as directed by  Jacquelyn Matt, MD while in the presence of Jacquelyn Matt, MD.   .I have reviewed the above documentation for accuracy and completeness, and I agree with the above. .Diogo Anne Kishore Darcey Cardy MD

## 2024-03-09 ENCOUNTER — Telehealth: Payer: Self-pay | Admitting: Neurology

## 2024-03-09 NOTE — Telephone Encounter (Signed)
 Patient left a voicemail on our phone stating he would like to make an appointment but he stated with Dr. Chalice.

## 2024-03-31 DIAGNOSIS — K219 Gastro-esophageal reflux disease without esophagitis: Secondary | ICD-10-CM | POA: Diagnosis not present

## 2024-03-31 DIAGNOSIS — F4312 Post-traumatic stress disorder, chronic: Secondary | ICD-10-CM | POA: Diagnosis not present

## 2024-05-12 DIAGNOSIS — R300 Dysuria: Secondary | ICD-10-CM | POA: Diagnosis not present

## 2024-05-12 DIAGNOSIS — N410 Acute prostatitis: Secondary | ICD-10-CM | POA: Diagnosis not present

## 2024-06-01 DIAGNOSIS — H0231 Blepharochalasis right upper eyelid: Secondary | ICD-10-CM | POA: Diagnosis not present

## 2024-06-01 DIAGNOSIS — H0235 Blepharochalasis left lower eyelid: Secondary | ICD-10-CM | POA: Diagnosis not present

## 2024-06-01 DIAGNOSIS — H0279 Other degenerative disorders of eyelid and periocular area: Secondary | ICD-10-CM | POA: Diagnosis not present

## 2024-06-01 DIAGNOSIS — D485 Neoplasm of uncertain behavior of skin: Secondary | ICD-10-CM | POA: Diagnosis not present

## 2024-06-01 DIAGNOSIS — H0234 Blepharochalasis left upper eyelid: Secondary | ICD-10-CM | POA: Diagnosis not present

## 2024-06-01 DIAGNOSIS — H0232 Blepharochalasis right lower eyelid: Secondary | ICD-10-CM | POA: Diagnosis not present

## 2024-06-01 DIAGNOSIS — Z01818 Encounter for other preprocedural examination: Secondary | ICD-10-CM | POA: Diagnosis not present

## 2024-06-09 DIAGNOSIS — Z125 Encounter for screening for malignant neoplasm of prostate: Secondary | ICD-10-CM | POA: Diagnosis not present

## 2024-06-09 DIAGNOSIS — E7800 Pure hypercholesterolemia, unspecified: Secondary | ICD-10-CM | POA: Diagnosis not present

## 2024-06-16 ENCOUNTER — Other Ambulatory Visit: Payer: Self-pay | Admitting: Internal Medicine

## 2024-06-16 DIAGNOSIS — G5602 Carpal tunnel syndrome, left upper limb: Secondary | ICD-10-CM | POA: Diagnosis not present

## 2024-06-16 DIAGNOSIS — R10A2 Flank pain, left side: Secondary | ICD-10-CM

## 2024-06-16 DIAGNOSIS — I700 Atherosclerosis of aorta: Secondary | ICD-10-CM | POA: Diagnosis not present

## 2024-06-16 DIAGNOSIS — Z23 Encounter for immunization: Secondary | ICD-10-CM | POA: Diagnosis not present

## 2024-06-16 DIAGNOSIS — F339 Major depressive disorder, recurrent, unspecified: Secondary | ICD-10-CM | POA: Diagnosis not present

## 2024-06-16 DIAGNOSIS — K429 Umbilical hernia without obstruction or gangrene: Secondary | ICD-10-CM | POA: Diagnosis not present

## 2024-06-16 DIAGNOSIS — N401 Enlarged prostate with lower urinary tract symptoms: Secondary | ICD-10-CM | POA: Diagnosis not present

## 2024-06-16 DIAGNOSIS — C8248 Follicular lymphoma grade IIIb, lymph nodes of multiple sites: Secondary | ICD-10-CM | POA: Diagnosis not present

## 2024-06-16 DIAGNOSIS — I2510 Atherosclerotic heart disease of native coronary artery without angina pectoris: Secondary | ICD-10-CM | POA: Diagnosis not present

## 2024-06-16 DIAGNOSIS — Z0000 Encounter for general adult medical examination without abnormal findings: Secondary | ICD-10-CM | POA: Diagnosis not present

## 2024-06-16 DIAGNOSIS — K219 Gastro-esophageal reflux disease without esophagitis: Secondary | ICD-10-CM | POA: Diagnosis not present

## 2024-06-16 DIAGNOSIS — R351 Nocturia: Secondary | ICD-10-CM | POA: Diagnosis not present

## 2024-06-24 ENCOUNTER — Ambulatory Visit
Admission: RE | Admit: 2024-06-24 | Discharge: 2024-06-24 | Disposition: A | Discharge status: Home / Self Care | Source: Ambulatory Visit | Attending: Internal Medicine | Admitting: Internal Medicine

## 2024-06-24 DIAGNOSIS — R10A2 Flank pain, left side: Secondary | ICD-10-CM

## 2024-06-24 DIAGNOSIS — K5730 Diverticulosis of large intestine without perforation or abscess without bleeding: Secondary | ICD-10-CM | POA: Diagnosis not present

## 2024-06-24 DIAGNOSIS — N200 Calculus of kidney: Secondary | ICD-10-CM | POA: Diagnosis not present

## 2024-06-25 DIAGNOSIS — M722 Plantar fascial fibromatosis: Secondary | ICD-10-CM | POA: Diagnosis not present

## 2024-06-25 DIAGNOSIS — M2012 Hallux valgus (acquired), left foot: Secondary | ICD-10-CM | POA: Diagnosis not present

## 2024-06-25 DIAGNOSIS — M79671 Pain in right foot: Secondary | ICD-10-CM | POA: Diagnosis not present

## 2024-06-25 DIAGNOSIS — M2011 Hallux valgus (acquired), right foot: Secondary | ICD-10-CM | POA: Diagnosis not present

## 2024-06-25 DIAGNOSIS — M79672 Pain in left foot: Secondary | ICD-10-CM | POA: Diagnosis not present

## 2024-07-22 ENCOUNTER — Other Ambulatory Visit: Payer: Self-pay

## 2024-07-22 DIAGNOSIS — C8248 Follicular lymphoma grade IIIb, lymph nodes of multiple sites: Secondary | ICD-10-CM

## 2024-07-26 ENCOUNTER — Inpatient Hospital Stay: Admitting: Hematology

## 2024-07-26 ENCOUNTER — Inpatient Hospital Stay: Attending: Hematology

## 2024-07-26 VITALS — BP 113/76 | HR 84 | Temp 97.5°F | Resp 20 | Wt 175.6 lb

## 2024-07-26 DIAGNOSIS — F419 Anxiety disorder, unspecified: Secondary | ICD-10-CM | POA: Diagnosis not present

## 2024-07-26 DIAGNOSIS — F32A Depression, unspecified: Secondary | ICD-10-CM | POA: Diagnosis not present

## 2024-07-26 DIAGNOSIS — C8248 Follicular lymphoma grade IIIb, lymph nodes of multiple sites: Secondary | ICD-10-CM | POA: Diagnosis not present

## 2024-07-26 DIAGNOSIS — C829A Follicular lymphoma, unspecified, in remission: Secondary | ICD-10-CM | POA: Diagnosis not present

## 2024-07-26 LAB — LACTATE DEHYDROGENASE: LDH: 151 U/L (ref 98–192)

## 2024-07-26 LAB — CBC WITH DIFFERENTIAL (CANCER CENTER ONLY)
Abs Immature Granulocytes: 0.02 K/uL (ref 0.00–0.07)
Basophils Absolute: 0 K/uL (ref 0.0–0.1)
Basophils Relative: 1 %
Eosinophils Absolute: 0.1 K/uL (ref 0.0–0.5)
Eosinophils Relative: 2 %
HCT: 43 % (ref 39.0–52.0)
Hemoglobin: 14.6 g/dL (ref 13.0–17.0)
Immature Granulocytes: 0 %
Lymphocytes Relative: 34 %
Lymphs Abs: 2 K/uL (ref 0.7–4.0)
MCH: 30 pg (ref 26.0–34.0)
MCHC: 34 g/dL (ref 30.0–36.0)
MCV: 88.5 fL (ref 80.0–100.0)
Monocytes Absolute: 0.4 K/uL (ref 0.1–1.0)
Monocytes Relative: 7 %
Neutro Abs: 3.3 K/uL (ref 1.7–7.7)
Neutrophils Relative %: 56 %
Platelet Count: 174 K/uL (ref 150–400)
RBC: 4.86 MIL/uL (ref 4.22–5.81)
RDW: 13.6 % (ref 11.5–15.5)
WBC Count: 5.9 K/uL (ref 4.0–10.5)
nRBC: 0 % (ref 0.0–0.2)

## 2024-07-26 LAB — CMP (CANCER CENTER ONLY)
ALT: 24 U/L (ref 0–44)
AST: 21 U/L (ref 15–41)
Albumin: 4.5 g/dL (ref 3.5–5.0)
Alkaline Phosphatase: 74 U/L (ref 38–126)
Anion gap: 6 (ref 5–15)
BUN: 14 mg/dL (ref 8–23)
CO2: 29 mmol/L (ref 22–32)
Calcium: 9.4 mg/dL (ref 8.9–10.3)
Chloride: 106 mmol/L (ref 98–111)
Creatinine: 0.96 mg/dL (ref 0.61–1.24)
GFR, Estimated: >60 mL/min (ref >60)
Glucose, Bld: 96 mg/dL (ref 70–99)
Potassium: 4.3 mmol/L (ref 3.5–5.1)
Sodium: 141 mmol/L (ref 135–145)
Total Bilirubin: 0.5 mg/dL (ref 0.0–1.2)
Total Protein: 6.4 g/dL — ABNORMAL LOW (ref 6.5–8.1)

## 2024-07-26 NOTE — Progress Notes (Signed)
 HEMATOLOGY ONCOLOGY PROGRESS NOTE  Date of service: 07/26/2024  Patient Care Team: [name], MD as PCP - General (Internal Medicine) Mona Vinie BROCKS, MD as PCP - Cardiology (Cardiology) [name], MD (Internal Medicine)  CHIEF COMPLAINT/PURPOSE OF CONSULTATION: Follow-up for continued evaluation and management of high-grade follicular lymphoma   HISTORY OF PRESENTING ILLNESS: (06/15/2020) George Proctor is a wonderful 68 y.o. male who has been referred to us  by Dr. Clarice for evaluation and management of possible wide-spread lymphoma - palpable mass in abdomen. Pt is accompanied today by his wife. The pt reports that he is doing well overall.    The pt reports that about three weeks ago he began experiencing abdominal fullness. Two weeks ago he began to feel abdominal pain. Lying down makes this discomfort worse. He has not had a restful sleep in two weeks. The abdominal fullness has caused lack of appetite, belching, and one episode of nausea and vomiting. He is currently eating 1/3-1/2 of his baseline. Pt has been taking Tramadol to control his pain, as well as a stool softener. His bowel movements look different, but he denies any constipation. He is hot natured and has mild night sweats, but denies drenching night sweats. Pt feels that he has been short of breath for awhile, but that this may have worsened lately. Pt follows with Dr. Rollin, GI.    He had abdominal surgery as a child after an accident. Pt has had GI bleeding and ulcers from taking NSAIDS. Pt has had several instances of abdominal swelling that were triggered by illness. Pt had esophageal dialation. His strictures were thought to be caused by acid reflux. He also has an umbilical hernia. He has chronic back pain. Pt [facility] with [facility] disease [facility]. He has a history of balance issues and has brought this up to his PCP, who did not feel that it required a work up. He does not have a  true allergy, but experiences nausea and vomiting when taking Omeprazole. Pt denies any international travel in the last year.    Pt has been taking weekly Testosterone  for 3-4 months. He was experiencing fatigue prior to beginning the hormonal injections. Pt was placed on Latuda & Lamictil several weeks ago for his Depression and Anxiety, but discontinued a week ago as he did not see any improvement in mood. He is not currently following with a mental health professional.    Dr. Clarice referred pt to Dr. Tanda at [facility] for biopsy consideration. Pt is scheduled to see Dr. Clarice next Wednesday.    Of note prior to the patient's visit today, pt has had  CT Abd/Pel completed on 06/05/2020 with results revealing numerous cardiophrenic, retroperitoneal and mesenteric adenopathy most consistent with lymphoma. Small volume of ascites. Hepatosplenomegaly.   Most recent lab results (03/22/2020) of CBC is as follows: all values are WNL except for WBC at 3.5K, PLT at 131K, Lymphs Rel at 17.6, Total Protein at 6.3.   On review of systems, pt reports worsening SOB, abdominal pain, abdominal fullness, low appetite, dyspepsia, mild night sweats and denies fevers, chills, rash, headaches, leg swelling, urinary habit changes, constipation, testicular pain/swelling and any other symptoms.    On PMHx the pt reports Depression, Anxiety, Abdominal Surgery, Gait difficulty, Esophageal dilation. On Family Hx the pt reports that his father and paternal aunt had polymyalgia rheumatica. His paternal aunt also had breast cancer.  SUMMARY OF ONCOLOGIC HISTORY: Oncology History  Follicular lymphoma grade  IIIb of lymph nodes of multiple sites (HCC)  06/29/2020 Initial Diagnosis   Follicular lymphoma grade IIIb of lymph nodes of multiple sites (HCC)   06/30/2020 - 10/16/2020 Chemotherapy   Patient is on Treatment Plan : NON-HODGKINS LYMPHOMA R-CHOP q21d      INTERVAL HISTORY:  George Proctor is a 68  y.o. male who is here today for continued evaluation and management of high-grade follicular lymphoma. accompanied by wife   he was last connected with me on 01/20/2024 via telephone; at the time he did not have any concerns and was doing well.   Today, he says that he has been doing well and does not have any concerns today. Denies any fevers/chills, drenching night sweats, chest pain, abdominal pain, new lumps/bumps, or SOB. He reports that he experienced a prostate infection recently, which was treated with antibiotics and he feels like he has recovered from this.   Says that his mood has been steady, though he found counseling unhelpful.   He states he sees his PCP annually.  REVIEW OF SYSTEMS:   10 Point review of systems of done and is negative except as noted above.  MEDICAL HISTORY Past Medical History:  Diagnosis Date   Anxiety    Depression    Gait difficulty    Lymphoma (HCC) dx'd 05/2020   Memory loss     SURGICAL HISTORY Past Surgical History:  Procedure Laterality Date   ABDOMINAL SURGERY     Childhood   BUNIONECTOMY     IR IMAGING GUIDED PORT INSERTION  06/30/2020   IR PARACENTESIS  06/26/2020   IR REMOVAL TUN ACCESS W/ PORT W/O FL MOD SED  05/17/2021   IR US  GUIDE BX ASP/DRAIN  06/26/2020    SOCIAL HISTORY Social History   Tobacco Use   Smoking status: Never   Smokeless tobacco: Never  Vaping Use   Vaping status: Never Used  Substance Use Topics   Alcohol use: No    Alcohol/week: 0.0 standard drinks of alcohol    Comment: Less than one drink per week.   Drug use: No    Social History   Social History Narrative   Lives at home with wife.   Right-handed.   3 cups caffeine per day.    SOCIAL DRIVERS OF HEALTH SDOH Screenings   Social Connections: Unknown (01/25/2022)   Received from [facility]  Tobacco Use: Low Risk  (08/08/2023)     FAMILY HISTORY Family History  Problem Relation Age of Onset   Aneurysm Mother 71       Thoracic    Hypertension Father    Heart failure Father    Heart disease Father    Schizophrenia Maternal Uncle    Heart attack Paternal Grandfather    Depression Cousin    Bipolar disorder Cousin    Sleep apnea Neg Hx      ALLERGIES: is allergic to crestor [rosuvastatin] and omeprazole.  MEDICATIONS  Current Outpatient Medications  Medication Sig Dispense Refill   Multiple Vitamin (MULTIVITAMIN) capsule Take 1 capsule by mouth daily.     zolpidem  (AMBIEN  CR) 6.25 MG CR tablet Take by mouth.     B Complex Vitamins (VITAMIN B COMPLEX PO) Take 1 tablet by mouth daily.  (Patient not taking: Reported on 07/26/2024)     famotidine  (PEPCID ) 20 MG tablet Take 20 mg by mouth 2 (two) times daily. (Patient not taking: Reported on 07/26/2024)     omega-3 acid ethyl esters (LOVAZA) 1 g capsule Take by  mouth. (Patient not taking: Reported on 07/26/2024)     UNABLE TO FIND Med Name: cocoavia supplement powder (Patient not taking: Reported on 07/26/2024)     VITAMIN D PO Take by mouth. With K2 (Patient not taking: Reported on 07/26/2024)     No current facility-administered medications for this visit.    PHYSICAL EXAMINATION: ECOG PERFORMANCE STATUS: 1 - Symptomatic but completely ambulatory VITALS: Vitals:   07/26/24 1220  BP: 113/76  Pulse: 84  Resp: 20  Temp: (!) 97.5 F (36.4 C)  SpO2: 97%   Filed Weights   07/26/24 1220  Weight: 175 lb 9.6 oz (79.7 kg)   Body mass index is 23.17 kg/m.  GENERAL: alert, in no acute distress and comfortable SKIN: no acute rashes, no significant lesions EYES: conjunctiva are pink and non-injected, sclera anicteric OROPHARYNX: MMM, no exudates, no oropharyngeal erythema or ulceration NECK: supple, no JVD LYMPH:  no palpable lymphadenopathy in the cervical, axillary or inguinal regions LUNGS: clear to auscultation b/l with normal respiratory effort HEART: regular rate & rhythm ABDOMEN:  normoactive bowel sounds , non tender, not distended, no  hepatosplenomegaly Extremity: no pedal edema PSYCH: alert & oriented x 3 with fluent speech NEURO: no focal motor/sensory deficits  LABORATORY DATA:   I have reviewed the data as listed     Latest Ref Rng & Units 07/26/2024   12:10 PM 12/01/2023   11:56 AM 05/26/2023    8:26 AM  CBC EXTENDED  WBC 4.0 - 10.5 K/uL 5.9  4.5  4.6   RBC 4.22 - 5.81 MIL/uL 4.86  4.77  4.82   Hemoglobin 13.0 - 17.0 g/dL 85.3  85.9  85.5   HCT 39.0 - 52.0 % 43.0  42.3  43.3   Platelets 150 - 400 K/uL 174  183  151   NEUT# 1.7 - 7.7 K/uL 3.3  2.5  2.9   Lymph# 0.7 - 4.0 K/uL 2.0  1.5  1.1    LDH:  Lab Results  Component Value Date   LDH 151 07/26/2024       Latest Ref Rng & Units 07/26/2024   12:10 PM 12/01/2023   11:56 AM 05/26/2023    8:26 AM  CMP  Glucose 70 - 99 mg/dL 96  91  894   BUN 8 - 23 mg/dL 14  24  14    Creatinine 0.61 - 1.24 mg/dL 9.03  8.98  9.03   Sodium 135 - 145 mmol/L 141  142  142   Potassium 3.5 - 5.1 mmol/L 4.3  4.7  3.8   Chloride 98 - 111 mmol/L 106  108  109   CO2 22 - 32 mmol/L 29  29  27    Calcium 8.9 - 10.3 mg/dL 9.4  9.3  9.1   Total Protein 6.5 - 8.1 g/dL 6.4  6.1  6.1   Total Bilirubin 0.0 - 1.2 mg/dL 0.5  0.5  0.7   Alkaline Phos 38 - 126 U/L 74  68  69   AST 15 - 41 U/L 21  18  20    ALT 0 - 44 U/L 24  19  19       RADIOGRAPHIC STUDIES: I have personally reviewed the radiological images as listed and agreed with the findings in the report. CT ABDOMEN PELVIS WO CONTRAST Result Date: 06/27/2024 CLINICAL DATA:  Left flank pain x1 year. No history of renal stones. EXAM: CT ABDOMEN AND PELVIS WITHOUT CONTRAST TECHNIQUE: Multidetector CT imaging of the abdomen and pelvis was performed  following the standard protocol without IV contrast. RADIATION DOSE REDUCTION: This exam was performed according to the departmental dose-optimization program which includes automated exposure control, adjustment of the mA and/or kV according to patient size and/or use of iterative  reconstruction technique. COMPARISON:  CT January 06, 2024 FINDINGS: Lower chest: No acute abnormality. Hepatobiliary: Unremarkable noncontrast enhanced appearance of the hepatic parenchyma. Gallbladder is unremarkable. No biliary ductal dilation. Pancreas: No pancreatic ductal dilation or evidence of acute inflammation. Spleen: No splenomegaly. Adrenals/Urinary Tract: Bilateral adrenal glands appear normal. Punctate nonobstructive 1-2 mm left lower pole renal stone. No obstructive ureteral or bladder calculi identified. Stomach/Bowel: Stomach is minimally distended limiting evaluation. Duodenal diverticula. No pathologic dilation of small or large bowel. Colonic diverticulosis. Colonic stool burden suggestive of constipation. 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 Aortic atherosclerosis. Prominent mesenteric and retroperitoneal lymph nodes are similar to prior. Reproductive: Dystrophic calcifications in the prostate gland. Other: Similar misty appearance of the mesentery extending into the mesenteric root. Fat containing umbilical hernia. Musculoskeletal: Thoracolumbar spondylosis. IMPRESSION: 1. Punctate nonobstructive 1-2 mm left lower pole renal stone. No obstructive ureteral or bladder calculi identified. 2. Colonic diverticulosis without evidence of acute diverticulitis. 3. Colonic stool burden suggestive of constipation. 4. 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 Recommend follow-up ultrasound every 3 years. 5. Similar misty appearance of the mesentery extending into the mesenteric root with prominent mesenteric and retroperitoneal lymph nodes Electronically Signed   By: Reyes Holder M.D.   On: 06/27/2024 06:29     ASSESSMENT & PLAN:   68 y.o. male with  1) Stage III/IV high grade follicular lymphoma-in complete remission S/p 6 cycles of R-CHOP with CR   2) anxiety and depression.  This has been chronic and  was accentuated during his treatment for lymphoma. Today, 07/26/2024, his moods have reportedly been stable. -f/u with his psychologist   PLAN: - Discussed lab results on 07/26/2024 in detail with patient: CBC and CMP normal.  Independently reviewed LDH which is 151  Still in remission per clinical evidence.   - Reviewed 06/27/2024 CT A/P:  1. Punctate nonobstructive 1-2 mm left lower pole renal stone. No obstructive ureteral or bladder calculi identified.  2. Colonic diverticulosis without evidence of acute diverticulitis.  3. Colonic stool burden suggestive of constipation.  4. 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 Recommend follow-up ultrasound every 3 years.  5. Similar misty appearance of the mesentery extending into the mesenteric root with prominent mesenteric and retroperitoneal lymph nodes    - He is now almost 4 years from his last treatment (10/16/2020) without any indication of reoccurrence, so we can start following him annually after one more visit in 6 months.   FOLLOW-UP with Dr Onesimo with labs in 6 months  The total time spent in the appointment was 25 minutes* .  All of the patient's questions were answered and the patient knows to call the clinic with any problems, questions, or concerns.  Emaline Onesimo MD MS AAHIVMS [facility] Hematology/Oncology Physician [facility]  *Total Encounter Time as defined by the Centers for Medicare and Medicaid Services includes, in addition to the face-to-face time of a patient visit (documented in the note above) non-face-to-face time: obtaining and reviewing outside history, ordering and reviewing medications, tests or procedures, care coordination (communications with other health care professionals or caregivers) and documentation in the medical record.  I,Emily Lagle,acting as a  scribe for Emaline Saran, MD.,have documented all relevant documentation on the behalf of Emaline Saran, MD,as directed  by  Emaline Saran, MD while in the presence of Emaline Saran, MD.  I have reviewed the above documentation for accuracy and completeness, and I agree with the above.  Naila Elizondo, MD

## 2024-09-23 ENCOUNTER — Encounter: Payer: Self-pay | Admitting: Neurology

## 2024-09-23 ENCOUNTER — Telehealth: Payer: Self-pay | Admitting: Neurology

## 2024-09-23 ENCOUNTER — Ambulatory Visit: Admitting: Neurology

## 2024-09-23 VITALS — BP 123/82 | HR 100 | Ht 73.0 in | Wt 178.6 lb

## 2024-09-23 DIAGNOSIS — R4189 Other symptoms and signs involving cognitive functions and awareness: Secondary | ICD-10-CM | POA: Insufficient documentation

## 2024-09-23 DIAGNOSIS — R413 Other amnesia: Secondary | ICD-10-CM | POA: Diagnosis not present

## 2024-09-23 NOTE — Progress Notes (Signed)
 "  Chief Complaint  Patient presents with   New Patient (Initial Visit)    Pt in room 14. Alone. proficient paper referral for brain fog, sleep deprived.      ASSESSMENT AND PLAN  [name] is a 69 y.o. male   Long history of untreated depression Brain fog, poor sleep quality  Most consistent with his mood disorder, has ongoing complaints for many years,  Discussed with patient, he would like to proceed with further evaluation to rule out structural abnormality  Proceed with MRI of the brain with without contrast, laboratory evaluation including thyroid  functional test,  Will inform him workup result  Encouraged him to continue work with psychologist, moderate exercise, increase in person interaction,   DIAGNOSTIC DATA (LABS, IMAGING, TESTING) - I reviewed patient records, labs, notes, testing and imaging myself where available.   MEDICAL HISTORY:  George Proctor, is a 69 year old male seen in request by his primary care doctor   George Proctor, for evaluation of brain fog, memory loss, difficulty sleeping  History is obtained from the patient and review of electronic medical records. I personally reviewed pertinent available imaging films in PACS.   PMHx of  Depression,   Patient is well-known to our clinic, I saw him initially October 2016 for similar complaints, he had lifelong history of depression, graduated from high school, tried college few times, could not finish college due to variable reasons, such as gastric ulcer, could not concentrate on his work, even suicidal attempt, over the years, he has been treated with different medication for his depression anxiety, without significant benefit, last attempt was early 2025, he was referred for possible ketamine treatment, but was not followed through.  He is not on any medications now.  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   Laboratory evaluation November 2025 normal CBC, CMP  He was seen by sleep specialist Dr. Buck in 2022, sleep study showed moderate obstructive sleep apnea, he was given CPAP machine, tried for 1 month, could not tolerate tolerated, no longer using it.  PHYSICAL EXAM:   Vitals:   09/23/24 0815  BP: 123/82  Pulse: 100  SpO2: 98%  Weight: 178 lb 9.6 oz (81 kg)  Height: 6' 1 (1.854 m)   Body mass index is 23.56 kg/m.  PHYSICAL EXAMNIATION:  Gen: NAD, conversant, well nourised, well groomed                     Cardiovascular: Regular rate rhythm, no peripheral edema, warm, nontender. Eyes: Conjunctivae clear without exudates or hemorrhage Neck: Supple, no carotid bruits. Pulmonary: Clear to auscultation bilaterally   NEUROLOGICAL EXAM:  MENTAL STATUS: Speech/cognition: Awake, alert, oriented to history taking and casual conversation CRANIAL NERVES: CN II: Visual fields are full to confrontation. Pupils are round equal and briskly reactive to light. CN III, IV, VI: extraocular movement are normal. No ptosis. CN V: Facial sensation is intact to light touch CN VII: Face is symmetric with normal eye closure  CN VIII: Hearing is normal to causal conversation. CN IX, X: Phonation is normal. CN XI: Head turning and shoulder shrug are intact  MOTOR: There is no pronator drift of out-stretched arms. Muscle bulk and tone are normal. Muscle strength  is normal.  REFLEXES: Reflexes are 2+ and symmetric at the biceps, triceps, knees, and ankles. Plantar responses are flexor.  SENSORY: Intact to light touch, pinprick and vibratory sensation are intact in fingers and toes.  COORDINATION: There is no trunk or limb dysmetria noted.  GAIT/STANCE: Posture is normal. Gait is steady   REVIEW OF SYSTEMS:  Full 14 system review  of systems performed and notable only for as above All other review of systems were negative.   ALLERGIES: Allergies[1]  HOME MEDICATIONS: Current Outpatient Medications  Medication Sig Dispense Refill   B Complex Vitamins (VITAMIN B COMPLEX PO) Take 1 tablet by mouth daily.      Multiple Vitamin (MULTIVITAMIN) capsule Take 1 capsule by mouth daily.     pantoprazole  (PROTONIX ) 20 MG tablet Take 20 mg by mouth daily.     zolpidem  (AMBIEN  CR) 6.25 MG CR tablet Take by mouth.     famotidine  (PEPCID ) 20 MG tablet Take 20 mg by mouth 2 (two) times daily. (Patient not taking: Reported on 09/23/2024)     omega-3 acid ethyl esters (LOVAZA) 1 g capsule Take by mouth. (Patient not taking: Reported on 09/23/2024)     UNABLE TO FIND Med Name: cocoavia supplement powder (Patient not taking: Reported on 09/23/2024)     VITAMIN D PO Take by mouth. With K2 (Patient not taking: Reported on 09/23/2024)     No current facility-administered medications for this visit.    PAST MEDICAL HISTORY: Past Medical History:  Diagnosis Date   Anxiety    Depression    Gait difficulty    Lymphoma (HCC) dx'd 05/2020   Memory loss    Plantar fasciitis     PAST SURGICAL HISTORY: Past Surgical History:  Procedure Laterality Date   ABDOMINAL SURGERY     Childhood   BUNIONECTOMY     IR IMAGING GUIDED PORT INSERTION  06/30/2020   IR PARACENTESIS  06/26/2020   IR REMOVAL TUN ACCESS W/ PORT W/O FL MOD SED  05/17/2021   IR US  GUIDE BX ASP/DRAIN  06/26/2020    FAMILY HISTORY: Family History  Problem Relation Age of Onset   Aneurysm Mother 28       Thoracic   Hypertension Father    Heart failure Father    Heart disease Father    Schizophrenia Maternal Uncle    Heart attack Paternal Grandfather    Depression Cousin    Bipolar disorder Cousin    Sleep apnea Neg Hx     SOCIAL HISTORY: Social History   Socioeconomic History   Marital status: Married    Spouse name: Not on file   Number of children: 0   Years  of education: [employer]   Highest education level: Not on file  Occupational History   Occupation: Child Psychotherapist  Tobacco Use   Smoking status: Never   Smokeless tobacco: Never  Vaping Use   Vaping status: Never Used  Substance and Sexual Activity   Alcohol use: No    Alcohol/week: 0.0 standard drinks of alcohol    Comment: Less than one drink per week.   Drug use: No   Sexual activity: Not Currently  Other Topics Concern   Not on file  Social History Narrative   Lives at home with wife.   Right-handed.   3 cups caffeine per day.   Social Drivers of Health   Tobacco Use: Low Risk (09/23/2024)   Patient History    Smoking Tobacco Use: Never  Smokeless Tobacco Use: Never    Passive Exposure: Not on file  Financial Resource Strain: Not on file  Food Insecurity: Not on file  Transportation Needs: Not on file  Physical Activity: Not on file  Stress: Not on file  Social Connections: Unknown (01/25/2022)   Received from [facility]   Social Network    Social Network: Not on file  Intimate Partner Violence: Unknown (12/17/2021)   Received from [facility]   HITS    Physically Hurt: Not on file    Insult or Talk Down To: Not on file    Threaten Physical Harm: Not on file    Scream or Curse: Not on file  Depression (EYV7-0): Not on file  Alcohol Screen: Not on file  Housing: Not on file  Utilities: Not on file  Health Literacy: Not on file      Modena Callander, M.D. Ph.D.  [facility] [address], [address] [place], [place] [address] Ph: [phone] Fax: [phone]  CC:  George Nottingham, MD [address] [place],  [place] [address]  George Nottingham, MD      [1]  Allergies Allergen Reactions   Crestor [Rosuvastatin] Other (See Comments)    Muscle/join pain, weakness   Omeprazole Nausea And Vomiting   "

## 2024-09-23 NOTE — Telephone Encounter (Signed)
 no auth required sent to GI [phone]

## 2024-09-24 LAB — TSH: TSH: 2.37 u[IU]/mL (ref 0.450–4.500)

## 2024-09-24 LAB — VITAMIN B12: Vitamin B-12: 594 pg/mL (ref 232–1245)

## 2024-09-24 LAB — SYPHILIS: RPR W/REFLEX TO RPR TITER AND TREPONEMAL ANTIBODIES, TRADITIONAL SCREENING AND DIAGNOSIS ALGORITHM: RPR Ser Ql: NONREACTIVE

## 2024-09-28 ENCOUNTER — Ambulatory Visit: Admitting: Neurology

## 2024-09-28 ENCOUNTER — Ambulatory Visit: Payer: Self-pay | Admitting: Neurology

## 2024-09-28 DIAGNOSIS — R4182 Altered mental status, unspecified: Secondary | ICD-10-CM | POA: Diagnosis not present

## 2024-09-28 DIAGNOSIS — R4189 Other symptoms and signs involving cognitive functions and awareness: Secondary | ICD-10-CM

## 2024-09-28 DIAGNOSIS — R413 Other amnesia: Secondary | ICD-10-CM

## 2024-09-29 ENCOUNTER — Ambulatory Visit: Admitting: Podiatry

## 2024-09-29 ENCOUNTER — Ambulatory Visit (INDEPENDENT_AMBULATORY_CARE_PROVIDER_SITE_OTHER)

## 2024-09-29 ENCOUNTER — Encounter: Payer: Self-pay | Admitting: Podiatry

## 2024-09-29 DIAGNOSIS — M722 Plantar fascial fibromatosis: Secondary | ICD-10-CM

## 2024-09-29 DIAGNOSIS — M7731 Calcaneal spur, right foot: Secondary | ICD-10-CM

## 2024-09-29 DIAGNOSIS — M79671 Pain in right foot: Secondary | ICD-10-CM | POA: Diagnosis not present

## 2024-09-29 MED ORDER — TRIAMCINOLONE ACETONIDE 10 MG/ML IJ SUSP
10.0000 mg | Freq: Once | INTRAMUSCULAR | Status: AC
  Administered 2024-09-29: 10 mg

## 2024-09-29 NOTE — Progress Notes (Signed)
 Patient presents for complaint of pain at the plantar aspect of the heel more on the medial side on the right.  Has been bothering for several months.  Had plantar fasciitis before but it was more along the arch.  Does not recall any injury to the foot or any new activities that may have aggravated it.  Has noticed any redness swelling or ecchymosis.  Also complains of a hammertoe deformity on the second toe where the toe bends down.  Does not give him a great deal of problems.   Physical exam:  General appearance: Pleasant, and in no acute distress. AOx3.  Vascular: Pedal pulses: DP 2/4 bilaterally, PT 2/4 bilaterally. Minimal edema lower legs bilaterally. Capillary fill time immediate bilaterally.  Neurological: Light touch intact feet bilaterally.  Normal Achilles reflex bilaterally.  No clonus or spasticity noted.  Negative Tinel sign tarsal tunnel and porta pedis bilaterally  Dermatologic:   Skin normal temperature bilaterally.  Skin normal color, tone, and texture bilaterally.   Musculoskeletal:  tenderness plantar medial aspect heel at medial plantar calcaneal tubercle right.  No tenderness lateral compression of the calcaneus.  Elongated second toe with hammertoe deformity right with lateral deviation of the distal phalanx and plantar  contracture at the DIPJ.    Radiographs: 3 views foot right: Osteophytic changes plantar and posterior aspect calcaneus.  No erosive changes noted.  No evidence of any fractures.  Normal bone density.  No evidence of bone tumors.  Screw in place first metatarsal head from probable bunionectomy.  Elongated second toe with hammertoe deformity.  Diagnosis: 1.  Pain right foot. 2.  Plantar fasciitis right. 3.  Calcaneal spur right. 4.  Hammertoe second Toe right  Plan: -New patient office visit for evaluation and management level 3. -Discussed plantar fasciitis etiology and treatment.  Discussed proper shoes.  Discussed hammertoe deformity on the  second toe.  Would advise going up a half size and shoe size to accommodate this.  Do not becomes more problematic in the future surgical correction could be considered. -Gave written exercises and therapy he can do at home for plantar fasciitis. -injected 3cc 2:1 mixture 0.5 cc Marcaine: Triamcinolone  40mg /71ml at plantar fascia origin medial plantar calcaneal tubercle right.     Return 2 weeks follow-up injection plantar fascia right

## 2024-10-06 ENCOUNTER — Ambulatory Visit
Admission: RE | Admit: 2024-10-06 | Discharge: 2024-10-06 | Disposition: A | Source: Ambulatory Visit | Attending: Neurology | Admitting: Neurology

## 2024-10-06 DIAGNOSIS — R4189 Other symptoms and signs involving cognitive functions and awareness: Secondary | ICD-10-CM

## 2024-10-06 DIAGNOSIS — R413 Other amnesia: Secondary | ICD-10-CM | POA: Diagnosis not present

## 2024-10-06 MED ORDER — GADOPICLENOL 0.5 MMOL/ML IV SOLN
10.0000 mL | Freq: Once | INTRAVENOUS | Status: AC | PRN
  Administered 2024-10-06: 10 mL via INTRAVENOUS

## 2024-10-12 NOTE — Procedures (Signed)
" ° °  HISTORY: 69 year old male with a long history of depression pulm follow-up  TECHNIQUE:  This is a routine 16 channel EEG recording with one channel devoted to a limited EKG recording.  It was performed during wakefulness, drowsiness and asleep.  Hyperventilation and photic stimulation were performed as activating procedures.  There are minimum muscle and movement artifact noted.  Upon maximum arousal, posterior dominant waking rhythm consistent of rhythmic alpha range activity. Activities are symmetric over the bilateral posterior derivations and attenuated with eye opening.  Photic stimulation did not alter the tracing.  Hyperventilation produced mild/moderate buildup with higher amplitude and the slower activities noted.  During EEG recording, patient developed drowsiness and no deeper stage of sleep was achieved.  During EEG recording, there was no epileptiform discharge noted.  EKG demonstrate normal sinus rhythm.  CONCLUSION: This is a  normal awake EEG.  There is no electrodiagnostic evidence of epileptiform discharge.  Omarrion Carmer, M.D. Ph.D.  [facility] [address] [place], [place] [address] Phone: [phone] Fax:      [phone]  "

## 2024-10-13 ENCOUNTER — Ambulatory Visit: Admitting: Podiatry

## 2024-10-13 DIAGNOSIS — M722 Plantar fascial fibromatosis: Secondary | ICD-10-CM

## 2024-10-13 NOTE — Patient Instructions (Signed)

## 2024-10-13 NOTE — Progress Notes (Signed)
 Patient presents follow-up injection for plantar fasciitis right.  Not really significant improvement.  Still tender with walking a lot and when he stands on it first thing in the morning.  No numbness or paresthesias noted.  Physical exam:  General appearance: Pleasant, and in no acute distress. AOx3.  Vascular: Pedal pulses: DP 2/4 bilaterally, PT 2/4 bilaterally. Minimal edema lower legs bilaterally. Capillary fill time immediate bilaterally  Neurological: Light touch intact feet bilaterally.  Normal Achilles reflex bilaterally.  No clonus or spasticity noted.  Negative Tinel sign tarsal tunnel and porta pedis bilaterally.  Dermatologic:   Skin normal temperature bilaterally.  Skin normal color, tone, and texture bilaterally.   Musculoskeletal: Tenderness at plantar medial aspect heel at medial plantar calcaneal tubercle right..  No tenderness with lateral compression of calcaneus.   Diagnosis: 1.  Plantar fasciitis right.   Plan: - Will try second injection today.  Continue wearing good stable supportive shoe. -Dispensed written instructions for at home therapy.  - Injected 3cc 2:1 mixture 0.5 cc Marcaine: Triamcinolone  40mg /87ml at plantar fascia origin medial plantar calcaneal tubercle right.    Return 2 weeks follow-up injection plantar fascia right

## 2024-10-14 MED ORDER — TRIAMCINOLONE ACETONIDE 40 MG/ML IJ SUSP
40.0000 mg | Freq: Once | INTRAMUSCULAR | Status: AC
  Administered 2024-10-14: 40 mg

## 2024-10-14 NOTE — Addendum Note (Signed)
 Addended by: CHRISTINE BARTER A on: 10/14/2024 04:23 PM   Modules accepted: Orders

## 2024-10-16 ENCOUNTER — Emergency Department (HOSPITAL_COMMUNITY)
Admission: EM | Admit: 2024-10-16 | Discharge: 2024-10-16 | Disposition: A | Discharge status: Home / Self Care | Attending: Emergency Medicine | Admitting: Emergency Medicine

## 2024-10-16 ENCOUNTER — Encounter (HOSPITAL_COMMUNITY): Payer: Self-pay

## 2024-10-16 ENCOUNTER — Other Ambulatory Visit: Payer: Self-pay

## 2024-10-16 ENCOUNTER — Emergency Department (HOSPITAL_COMMUNITY)

## 2024-10-16 DIAGNOSIS — M79604 Pain in right leg: Secondary | ICD-10-CM

## 2024-10-16 DIAGNOSIS — M62838 Other muscle spasm: Secondary | ICD-10-CM | POA: Diagnosis not present

## 2024-10-16 DIAGNOSIS — X58XXXA Exposure to other specified factors, initial encounter: Secondary | ICD-10-CM | POA: Insufficient documentation

## 2024-10-16 DIAGNOSIS — S8421XA Injury of cutaneous sensory nerve at lower leg level, right leg, initial encounter: Secondary | ICD-10-CM | POA: Insufficient documentation

## 2024-10-16 MED ORDER — LIDOCAINE 5 % EX PTCH
1.0000 | MEDICATED_PATCH | CUTANEOUS | Status: DC
  Administered 2024-10-16: 1 via TRANSDERMAL
  Filled 2024-10-16: qty 1

## 2024-10-16 MED ORDER — METHOCARBAMOL 500 MG PO TABS: 500.0000 mg | ORAL_TABLET | Freq: Two times a day (BID) | ORAL | 0 refills | Status: AC

## 2024-10-16 MED ORDER — NAPROXEN 250 MG PO TABS
500.0000 mg | ORAL_TABLET | Freq: Once | ORAL | Status: AC
  Administered 2024-10-16: 500 mg via ORAL
  Filled 2024-10-16: qty 2

## 2024-10-16 MED ORDER — LIDOCAINE 4 % EX PTCH: 1.0000 | MEDICATED_PATCH | Freq: Two times a day (BID) | CUTANEOUS | 0 refills | Status: AC

## 2024-10-16 MED ORDER — NAPROXEN 375 MG PO TABS: 375.0000 mg | ORAL_TABLET | Freq: Two times a day (BID) | ORAL | 0 refills | Status: AC

## 2024-10-16 NOTE — ED Triage Notes (Signed)
 Pt BIB [facility] from home c/o of right leg/thigh pain x3 days and numbness in that extremity that started this morning. Pt verbalizes that he has been more active than normal this week shoveling snow and sledding on Tuesday.  Pt states it hurts more when moving or lifting RLE.  A&Ox4. 5/10 pain.   EMS vitals  142/90  100 HR  97% sp02 room air  16 rr   129 cbg

## 2024-10-16 NOTE — ED Notes (Signed)
 Patient Alert and oriented to baseline. Stable and ambulatory to baseline. Patient verbalized understanding of the discharge instructions.  Patient belongings were taken by the patient.

## 2024-10-16 NOTE — ED Provider Notes (Signed)
 " [facility] Provider Note   CSN: [id number] Arrival date & time: 10/16/24  9460     Patient presents with: Leg Pain   George Proctor is a 69 y.o. male.   HPI    69 year old male comes in with chief complaint of leg pain and numbness.  Patient has no history of hypertension, hyperlipidemia, diabetes and no history of heart attack or stroke.  He also does not have any back issues.  He reports that he maybe had too much fun with the snow, as he had done some sliding and shoveling.  He started experiencing some pain in both eyes, right worse than left.  Pain is usually worse with any activity, at rest he is comfortable.  Pain is localized to the right lateral/outside part of the thigh only.  This morning however he has started experiencing numbness to the same location, right outside part of the thigh only.  Patient has no history of DVT, PE. He denies any numbness or tingling to the right upper extremity, rest of the right leg, right side of the face and denies any slurred speech, vision loss.   Prior to Admission medications  Medication Sig Start Date End Date Taking? Authorizing Provider  lidocaine  4 % Place 1 patch onto the skin 2 (two) times daily. 10/16/24  Yes Charlyn Sora, MD  methocarbamol  (ROBAXIN ) 500 MG tablet Take 1 tablet (500 mg total) by mouth 2 (two) times daily. 10/16/24  Yes Charlyn Sora, MD  naproxen  (NAPROSYN ) 375 MG tablet Take 1 tablet (375 mg total) by mouth 2 (two) times daily. 10/16/24  Yes Charlyn Sora, MD  B Complex Vitamins (VITAMIN B COMPLEX PO) Take 1 tablet by mouth daily.     [provider]  Multiple Vitamin (MULTIVITAMIN) capsule Take 1 capsule by mouth daily.    [provider]  omega-3 acid ethyl esters (LOVAZA) 1 g capsule Take by mouth. Patient not taking: Reported on 09/29/2024 03/24/18   [provider]  pantoprazole  (PROTONIX ) 20 MG tablet Take 20 mg by mouth daily.  09/16/24   [provider]  zolpidem  (AMBIEN  CR) 6.25 MG CR tablet Take by mouth.    [provider]    Allergies: Crestor [rosuvastatin], Omeprazole, and Paxlovid [nirmatrelvir-ritonavir]    Review of Systems  All other systems reviewed and are negative.   Updated Vital Signs BP (!) 121/93 (BP Location: Left Arm)   Pulse 92   Temp 98.7 F (37.1 C) (Oral)   Resp 15   Ht 6' 1 (1.854 m)   Wt 80.3 kg   SpO2 97%   BMI 23.35 kg/m   Physical Exam Vitals and nursing note reviewed.  Constitutional:      Appearance: He is well-developed.  HENT:     Head: Atraumatic.  Eyes:     Extraocular Movements: Extraocular movements intact.     Pupils: Pupils are equal, round, and reactive to light.  Cardiovascular:     Rate and Rhythm: Normal rate.  Pulmonary:     Effort: Pulmonary effort is normal.  Musculoskeletal:        General: No swelling or tenderness.     Cervical back: Neck supple.     Right lower leg: No edema.     Left lower leg: No edema.  Skin:    General: Skin is warm.  Neurological:     Mental Status: He is alert and oriented to person, place, and time.  Cranial Nerves: No cranial nerve deficit.     Sensory: Sensory deficit present.     Motor: No weakness.     Comments: Patient has no reproducible tenderness over the lumbar spine. With passive leg raise, there is no worsening of the pain. Patient has significant discomfort with active leg raise, but the strength is 4+ out of 5 for both left and right lower extremity.  Range of motion is slightly limited to the right lower extremity because of pain.  Upper extremity sensory and motor exam is normal.  Patient has subjective numbness to the right thigh on the lateral aspect only, there is no numbness of the medial aspect of the thigh or to the right leg     (all labs ordered are listed, but only abnormal results are displayed) Labs Reviewed - No data to display  EKG: None  Radiology: DG Hip  Unilat W or Wo Pelvis 2-3 Views Right Result Date: 10/16/2024 CLINICAL DATA:  fall EXAM: DG HIP (WITH OR WITHOUT PELVIS) 2-3V RIGHT; RIGHT FEMUR 2 VIEWS COMPARISON:  June 24, 2020 FINDINGS: No acute fracture or dislocation. Mild joint space narrowing and osteophyte formation along the RIGHT hip with undulating RIGHT femoral neck contour consistent mild degenerative changes. Ossified lumbosacral ligaments. The sacrum is obscured by overlapping bowel contents. No area of erosion or osseous destruction. No unexpected radiopaque foreign body. Pelvic phleboliths. IMPRESSION: 1. No acute fracture or dislocation. 2. If there is a persistent clinical concern for nondisplaced hip or pelvic fracture, recommend dedicated pelvic CT or MRI. Electronically Signed   By: Corean Salter M.D.   On: 10/16/2024 08:13   DG Femur Min 2 Views Right Result Date: 10/16/2024 CLINICAL DATA:  fall EXAM: DG HIP (WITH OR WITHOUT PELVIS) 2-3V RIGHT; RIGHT FEMUR 2 VIEWS COMPARISON:  June 24, 2020 FINDINGS: No acute fracture or dislocation. Mild joint space narrowing and osteophyte formation along the RIGHT hip with undulating RIGHT femoral neck contour consistent mild degenerative changes. Ossified lumbosacral ligaments. The sacrum is obscured by overlapping bowel contents. No area of erosion or osseous destruction. No unexpected radiopaque foreign body. Pelvic phleboliths. IMPRESSION: 1. No acute fracture or dislocation. 2. If there is a persistent clinical concern for nondisplaced hip or pelvic fracture, recommend dedicated pelvic CT or MRI. Electronically Signed   By: Corean Salter M.D.   On: 10/16/2024 08:13     Procedures   Medications Ordered in the ED  lidocaine  (LIDODERM ) 5 % 1 patch (1 patch Transdermal Patch Applied 10/16/24 0857)  naproxen  (NAPROSYN ) tablet 500 mg (500 mg Oral Given 10/16/24 0857)                                    Medical Decision Making Risk OTC drugs. Prescription drug  management.   69 year old male comes in with chief complaint of right-sided leg pain, with now numbness to the same side.  The pain and numbness is localized to the right lateral thigh only.  On exam, patient does not have any focal tenderness over the lumbar spine or the right hip.  Patient has significant discomfort with active leg raise only.   Differential diagnosis considered for this patient includes meralgia paresthetica, sciatica/impingement syndrome, degenerative spine disease.  I have low overall clinical suspicion for both stroke and DVT.  Patient had come to the ER with concerns for clots.  He has PCP.  I went over my clinical suspicion and  the rationale with the patient.  We have discussed that he does not have any stroke risk factors or DVT/PE risk factors at this time.  We do not see any clinical signs of DVT.  Given the timing of the symptoms and the progression and the localized nature of it, our suspicion is high that this is localized cutaneous nerve injury/irritation due to possibly muscle spasms or impingement syndrome.  Patient will start conservative measures right away. I will order outpatient ultrasound DVT study, patient will only schedule the DVT study if he is not getting better in the next 3 to 5 days.  He will follow-up with his PCP this week as well. He will return to the ER immediately if he starts having any new neurologic symptoms or worsening of existing numbness.   Final diagnoses:  Injury of right medial sural cutaneous nerve, initial encounter  Right leg pain  Muscle spasticity    ED Discharge Orders          Ordered    LE Venous       Comments: IMPORTANT PATIENT INSTRUCTIONS: You have been scheduled for an Outpatient Vascular Study at [facility].  If tomorrow is a Saturday, Sunday or holiday, please go to the [facility] Registration Desk at 11 am tomorrow morning and tell them you are there for a vascular study.  If tomorrow  is a weekday (Monday-Friday), please go to the [facility] (address [address], [place]) at 8 am and report to the 4th floor registration Zone A.  Inform registration that you are there for a vascular study.   10/16/24 0943    lidocaine 4 %  2 times daily        10/16/24 0944    naproxen (NAPROSYN) 375 MG tablet  2 times daily        10/16/24 0944    methocarbamol (ROBAXIN) 500 MG tablet  2 times daily        01 /31/26 0944               Charlyn Sora, MD 10/16/24 1003  "

## 2024-10-16 NOTE — ED Provider Triage Note (Signed)
 Emergency Medicine Provider Triage Evaluation Note  George Proctor , a 69 y.o. male  was evaluated in triage.  Pt complains of right upper thigh pain and numbness.  Notes pain to the right upper thigh for 4 days.  States he was more active out shoveling and sliding this week.  He notes numbness to the right lateral thigh caused him to come to the ED today.  He notes he had a fall a few days ago and pain has continued to worsen.  No blood thinner use.  No history of blood clot.  Denies back pain.  Review of Systems  Positive: Arthralgia, numbness Negative: Weakness, redness, edema  Physical Exam  BP (!) 121/93 (BP Location: Left Arm)   Pulse 92   Temp 98.7 F (37.1 C) (Oral)   Resp 15   Ht 6' 1 (1.854 m)   Wt 80.3 kg   SpO2 97%   BMI 23.35 kg/m  Gen:   Awake, no distress   Resp:  Normal effort  MSK:   Moves extremities without difficulty  Other:    Medical Decision Making  Medically screening exam initiated at 6:43 AM.  Appropriate orders placed.  George Proctor was informed that the remainder of the evaluation will be completed by another provider, this initial triage assessment does not replace that evaluation, and the importance of remaining in the ED until their evaluation is complete.     George Proctor, [place] 10/16/24 [id number]

## 2024-10-27 ENCOUNTER — Ambulatory Visit: Admitting: Podiatry

## 2025-01-24 ENCOUNTER — Inpatient Hospital Stay

## 2025-01-24 ENCOUNTER — Inpatient Hospital Stay: Admitting: Hematology
